# Patient Record
Sex: Male | Born: 1940 | Race: White | Hispanic: No | Marital: Married | Smoking: Former smoker
Health system: Southern US, Community
[De-identification: ages and names within clinical notes are randomized; demographics above are authoritative.]

## PROBLEM LIST (undated history)

## (undated) DIAGNOSIS — I10 Essential (primary) hypertension: Secondary | ICD-10-CM

## (undated) DIAGNOSIS — K760 Fatty (change of) liver, not elsewhere classified: Secondary | ICD-10-CM

## (undated) DIAGNOSIS — H9190 Unspecified hearing loss, unspecified ear: Secondary | ICD-10-CM

## (undated) DIAGNOSIS — G709 Myoneural disorder, unspecified: Secondary | ICD-10-CM

## (undated) DIAGNOSIS — Z860100 Personal history of colon polyps, unspecified: Secondary | ICD-10-CM

## (undated) DIAGNOSIS — R7302 Impaired glucose tolerance (oral): Secondary | ICD-10-CM

## (undated) DIAGNOSIS — F32A Depression, unspecified: Secondary | ICD-10-CM

## (undated) DIAGNOSIS — E785 Hyperlipidemia, unspecified: Secondary | ICD-10-CM

## (undated) DIAGNOSIS — F329 Major depressive disorder, single episode, unspecified: Secondary | ICD-10-CM

## (undated) HISTORY — DX: Personal history of colon polyps, unspecified: Z86.0100

## (undated) HISTORY — PX: COLONOSCOPY: SHX174

## (undated) HISTORY — PX: SQUAMOUS CELL CARCINOMA EXCISION: SHX2433

## (undated) HISTORY — DX: Essential (primary) hypertension: I10

## (undated) HISTORY — DX: Major depressive disorder, single episode, unspecified: F32.9

## (undated) HISTORY — DX: Fatty (change of) liver, not elsewhere classified: K76.0

## (undated) HISTORY — DX: Impaired glucose tolerance (oral): R73.02

## (undated) HISTORY — DX: Unspecified hearing loss, unspecified ear: H91.90

## (undated) HISTORY — DX: Depression, unspecified: F32.A

## (undated) HISTORY — DX: Hyperlipidemia, unspecified: E78.5

## (undated) HISTORY — DX: Personal history of colonic polyps: Z86.010

## (undated) HISTORY — PX: VASECTOMY: SHX75

---

## 1999-11-21 HISTORY — PX: TIBIA FRACTURE SURGERY: SHX806

## 2000-11-20 HISTORY — PX: WRIST FRACTURE SURGERY: SHX121

## 2005-02-10 ENCOUNTER — Encounter: Admission: RE | Admit: 2005-02-10 | Discharge: 2005-02-10 | Payer: Self-pay | Admitting: Family Medicine

## 2005-05-24 ENCOUNTER — Ambulatory Visit: Payer: Self-pay | Admitting: Gastroenterology

## 2005-06-15 ENCOUNTER — Ambulatory Visit: Payer: Self-pay | Admitting: Internal Medicine

## 2005-06-15 DIAGNOSIS — Z860100 Personal history of colon polyps, unspecified: Secondary | ICD-10-CM | POA: Insufficient documentation

## 2008-07-23 ENCOUNTER — Ambulatory Visit: Payer: Self-pay | Admitting: Internal Medicine

## 2008-08-06 ENCOUNTER — Encounter: Payer: Self-pay | Admitting: Internal Medicine

## 2008-08-06 ENCOUNTER — Ambulatory Visit: Payer: Self-pay | Admitting: Internal Medicine

## 2008-08-06 LAB — HM COLONOSCOPY

## 2008-08-07 ENCOUNTER — Encounter: Payer: Self-pay | Admitting: Internal Medicine

## 2008-11-05 ENCOUNTER — Encounter: Payer: Self-pay | Admitting: Internal Medicine

## 2009-04-21 ENCOUNTER — Telehealth: Payer: Self-pay | Admitting: Internal Medicine

## 2009-04-21 ENCOUNTER — Ambulatory Visit: Payer: Self-pay | Admitting: Internal Medicine

## 2009-04-21 DIAGNOSIS — N521 Erectile dysfunction due to diseases classified elsewhere: Secondary | ICD-10-CM

## 2009-04-21 DIAGNOSIS — E1169 Type 2 diabetes mellitus with other specified complication: Secondary | ICD-10-CM | POA: Insufficient documentation

## 2009-04-21 LAB — CONVERTED CEMR LAB
Cholesterol, target level: 200 mg/dL
HDL goal, serum: 40 mg/dL
LDL Goal: 130 mg/dL

## 2009-04-22 DIAGNOSIS — F3289 Other specified depressive episodes: Secondary | ICD-10-CM | POA: Insufficient documentation

## 2009-04-22 DIAGNOSIS — F329 Major depressive disorder, single episode, unspecified: Secondary | ICD-10-CM | POA: Insufficient documentation

## 2009-04-22 DIAGNOSIS — I10 Essential (primary) hypertension: Secondary | ICD-10-CM | POA: Insufficient documentation

## 2009-04-22 DIAGNOSIS — E785 Hyperlipidemia, unspecified: Secondary | ICD-10-CM | POA: Insufficient documentation

## 2009-05-26 ENCOUNTER — Emergency Department (HOSPITAL_COMMUNITY): Admission: EM | Admit: 2009-05-26 | Discharge: 2009-05-27 | Payer: Self-pay | Admitting: Emergency Medicine

## 2009-06-08 ENCOUNTER — Emergency Department (HOSPITAL_COMMUNITY): Admission: EM | Admit: 2009-06-08 | Discharge: 2009-06-08 | Payer: Self-pay | Admitting: Family Medicine

## 2009-06-24 ENCOUNTER — Emergency Department (HOSPITAL_COMMUNITY): Admission: EM | Admit: 2009-06-24 | Discharge: 2009-06-24 | Payer: Self-pay | Admitting: Family Medicine

## 2009-07-06 ENCOUNTER — Ambulatory Visit: Payer: Self-pay | Admitting: Internal Medicine

## 2009-07-06 DIAGNOSIS — E1140 Type 2 diabetes mellitus with diabetic neuropathy, unspecified: Secondary | ICD-10-CM | POA: Insufficient documentation

## 2009-07-06 LAB — CONVERTED CEMR LAB
ALT: 49 units/L (ref 0–53)
AST: 32 units/L (ref 0–37)
Albumin: 4.2 g/dL (ref 3.5–5.2)
Alkaline Phosphatase: 59 units/L (ref 39–117)
BUN: 21 mg/dL (ref 6–23)
Basophils Absolute: 0.1 10*3/uL (ref 0.0–0.1)
Basophils Relative: 0.8 % (ref 0.0–3.0)
Bilirubin Urine: NEGATIVE
Bilirubin, Direct: 0.2 mg/dL (ref 0.0–0.3)
CO2: 29 meq/L (ref 19–32)
CRP, High Sensitivity: 2 (ref 0.00–5.00)
Calcium: 9.4 mg/dL (ref 8.4–10.5)
Chloride: 108 meq/L (ref 96–112)
Creatinine, Ser: 1 mg/dL (ref 0.4–1.5)
Eosinophils Absolute: 0.2 10*3/uL (ref 0.0–0.7)
Eosinophils Relative: 3.8 % (ref 0.0–5.0)
Folate: >20 ng/mL
GFR calc non Af Amer: 79.08 mL/min (ref >60)
Glucose, Bld: 137 mg/dL — ABNORMAL HIGH (ref 70–99)
HCT: 43.6 % (ref 39.0–52.0)
Hemoglobin, Urine: NEGATIVE
Hemoglobin: 15 g/dL (ref 13.0–17.0)
Hgb A1c MFr Bld: 6.8 % — ABNORMAL HIGH (ref 4.6–6.5)
Iron: 113 ug/dL (ref 42–165)
Ketones, ur: NEGATIVE mg/dL
Leukocytes, UA: NEGATIVE
Lymphocytes Relative: 19.9 % (ref 12.0–46.0)
Lymphs Abs: 1.3 10*3/uL (ref 0.7–4.0)
MCHC: 34.4 g/dL (ref 30.0–36.0)
MCV: 91.2 fL (ref 78.0–100.0)
Monocytes Absolute: 0.7 10*3/uL (ref 0.1–1.0)
Monocytes Relative: 11.3 % (ref 3.0–12.0)
Neutro Abs: 4.2 10*3/uL (ref 1.4–7.7)
Neutrophils Relative %: 64.2 % (ref 43.0–77.0)
Nitrite: NEGATIVE
Platelets: 178 10*3/uL (ref 150.0–400.0)
Potassium: 4.1 meq/L (ref 3.5–5.1)
RBC: 4.78 M/uL (ref 4.22–5.81)
RDW: 12.1 % (ref 11.5–14.6)
Saturation Ratios: 32.4 % (ref 20.0–50.0)
Sodium: 143 meq/L (ref 135–145)
Specific Gravity, Urine: 1.02 (ref 1.000–1.030)
TSH: 1.71 microintl units/mL (ref 0.35–5.50)
Total Bilirubin: 1.3 mg/dL — ABNORMAL HIGH (ref 0.3–1.2)
Total Protein, Urine: NEGATIVE mg/dL
Total Protein: 7.1 g/dL (ref 6.0–8.3)
Transferrin: 248.8 mg/dL (ref 212.0–360.0)
Urine Glucose: NEGATIVE mg/dL
Urobilinogen, UA: 0.2 (ref 0.0–1.0)
Vitamin B-12: 343 pg/mL (ref 211–911)
WBC: 6.5 10*3/uL (ref 4.5–10.5)
pH: 6 (ref 5.0–8.0)

## 2009-07-07 ENCOUNTER — Encounter: Payer: Self-pay | Admitting: Internal Medicine

## 2009-08-05 ENCOUNTER — Ambulatory Visit: Payer: Self-pay | Admitting: Internal Medicine

## 2009-08-05 DIAGNOSIS — E118 Type 2 diabetes mellitus with unspecified complications: Secondary | ICD-10-CM | POA: Insufficient documentation

## 2009-08-17 ENCOUNTER — Ambulatory Visit: Payer: Self-pay | Admitting: Internal Medicine

## 2009-08-19 ENCOUNTER — Telehealth: Payer: Self-pay | Admitting: Internal Medicine

## 2009-11-05 ENCOUNTER — Ambulatory Visit: Payer: Self-pay | Admitting: Internal Medicine

## 2009-11-05 LAB — CONVERTED CEMR LAB
BUN: 16 mg/dL (ref 6–23)
CO2: 29 meq/L (ref 19–32)
Calcium: 9.8 mg/dL (ref 8.4–10.5)
Chloride: 100 meq/L (ref 96–112)
Cholesterol, target level: 200 mg/dL
Creatinine, Ser: 1.1 mg/dL (ref 0.4–1.5)
Folate: >20 ng/mL
GFR calc non Af Amer: 70.77 mL/min (ref >60)
Glucose, Bld: 98 mg/dL (ref 70–99)
HDL goal, serum: 40 mg/dL
Hgb A1c MFr Bld: 6.1 % (ref 4.6–6.5)
LDL Goal: 100 mg/dL
Potassium: 3.9 meq/L (ref 3.5–5.1)
Sodium: 138 meq/L (ref 135–145)
Vitamin B-12: 581 pg/mL (ref 211–911)

## 2010-01-03 ENCOUNTER — Telehealth: Payer: Self-pay | Admitting: Internal Medicine

## 2010-01-04 ENCOUNTER — Telehealth: Payer: Self-pay | Admitting: Internal Medicine

## 2010-04-29 ENCOUNTER — Ambulatory Visit: Payer: Self-pay | Admitting: Internal Medicine

## 2010-04-29 DIAGNOSIS — Z860100 Personal history of colon polyps, unspecified: Secondary | ICD-10-CM | POA: Insufficient documentation

## 2010-04-29 LAB — CONVERTED CEMR LAB
ALT: 25 units/L (ref 0–53)
AST: 21 units/L (ref 0–37)
Albumin: 4.4 g/dL (ref 3.5–5.2)
Alkaline Phosphatase: 55 units/L (ref 39–117)
BUN: 19 mg/dL (ref 6–23)
Basophils Absolute: 0 10*3/uL (ref 0.0–0.1)
Basophils Relative: 0.4 % (ref 0.0–3.0)
Bilirubin Urine: NEGATIVE
Bilirubin, Direct: 0.1 mg/dL (ref 0.0–0.3)
CO2: 32 meq/L (ref 19–32)
Calcium: 9.6 mg/dL (ref 8.4–10.5)
Chloride: 104 meq/L (ref 96–112)
Cholesterol: 129 mg/dL (ref 0–200)
Creatinine, Ser: 1 mg/dL (ref 0.4–1.5)
Eosinophils Absolute: 0.3 10*3/uL (ref 0.0–0.7)
Eosinophils Relative: 4.6 % (ref 0.0–5.0)
GFR calc non Af Amer: 82.69 mL/min (ref >60)
Glucose, Bld: 86 mg/dL (ref 70–99)
HCT: 41.4 % (ref 39.0–52.0)
HDL: 38.5 mg/dL — ABNORMAL LOW (ref >39.00)
Hemoglobin, Urine: NEGATIVE
Hemoglobin: 14.3 g/dL (ref 13.0–17.0)
Hgb A1c MFr Bld: 5.9 % (ref 4.6–6.5)
Ketones, ur: NEGATIVE mg/dL
LDL Cholesterol: 66 mg/dL (ref 0–99)
Lymphocytes Relative: 20.1 % (ref 12.0–46.0)
Lymphs Abs: 1.4 10*3/uL (ref 0.7–4.0)
MCHC: 34.5 g/dL (ref 30.0–36.0)
MCV: 92.2 fL (ref 78.0–100.0)
Monocytes Absolute: 0.6 10*3/uL (ref 0.1–1.0)
Monocytes Relative: 9.2 % (ref 3.0–12.0)
Neutro Abs: 4.5 10*3/uL (ref 1.4–7.7)
Neutrophils Relative %: 65.7 % (ref 43.0–77.0)
Nitrite: NEGATIVE
PSA: 0.11 ng/mL (ref 0.10–4.00)
Platelets: 211 10*3/uL (ref 150.0–400.0)
Potassium: 4.3 meq/L (ref 3.5–5.1)
RBC: 4.49 M/uL (ref 4.22–5.81)
RDW: 13.9 % (ref 11.5–14.6)
Sodium: 144 meq/L (ref 135–145)
Specific Gravity, Urine: 1.02 (ref 1.000–1.030)
TSH: 1.06 microintl units/mL (ref 0.35–5.50)
Total Bilirubin: 0.7 mg/dL (ref 0.3–1.2)
Total CHOL/HDL Ratio: 3
Total Protein, Urine: NEGATIVE mg/dL
Total Protein: 6.7 g/dL (ref 6.0–8.3)
Triglycerides: 121 mg/dL (ref 0.0–149.0)
Urine Glucose: NEGATIVE mg/dL
Urobilinogen, UA: 0.2 (ref 0.0–1.0)
VLDL: 24.2 mg/dL (ref 0.0–40.0)
WBC: 6.8 10*3/uL (ref 4.5–10.5)
pH: 6 (ref 5.0–8.0)

## 2010-05-01 ENCOUNTER — Encounter: Payer: Self-pay | Admitting: Internal Medicine

## 2010-05-02 ENCOUNTER — Telehealth: Payer: Self-pay | Admitting: Internal Medicine

## 2010-08-17 ENCOUNTER — Inpatient Hospital Stay (HOSPITAL_COMMUNITY): Admission: EM | Admit: 2010-08-17 | Discharge: 2010-08-17 | Payer: Self-pay | Admitting: Internal Medicine

## 2010-09-02 ENCOUNTER — Ambulatory Visit: Payer: Self-pay | Admitting: Internal Medicine

## 2010-09-02 ENCOUNTER — Encounter: Payer: Self-pay | Admitting: Internal Medicine

## 2010-09-02 DIAGNOSIS — S2239XA Fracture of one rib, unspecified side, initial encounter for closed fracture: Secondary | ICD-10-CM | POA: Insufficient documentation

## 2010-09-02 LAB — CONVERTED CEMR LAB
Cholesterol, target level: 200 mg/dL
HDL goal, serum: 40 mg/dL
LDL Goal: 70 mg/dL

## 2010-09-12 ENCOUNTER — Telehealth (INDEPENDENT_AMBULATORY_CARE_PROVIDER_SITE_OTHER): Payer: Self-pay | Admitting: *Deleted

## 2010-09-13 ENCOUNTER — Encounter (HOSPITAL_COMMUNITY)
Admission: RE | Admit: 2010-09-13 | Discharge: 2010-11-19 | Payer: Self-pay | Source: Home / Self Care | Attending: Internal Medicine | Admitting: Internal Medicine

## 2010-09-13 ENCOUNTER — Ambulatory Visit: Payer: Self-pay

## 2010-09-13 ENCOUNTER — Ambulatory Visit: Payer: Self-pay | Admitting: Cardiovascular Disease

## 2010-09-13 ENCOUNTER — Encounter: Payer: Self-pay | Admitting: Cardiovascular Disease

## 2010-09-14 ENCOUNTER — Encounter: Payer: Self-pay | Admitting: Internal Medicine

## 2010-09-14 ENCOUNTER — Telehealth: Payer: Self-pay | Admitting: Internal Medicine

## 2010-09-16 ENCOUNTER — Telehealth: Payer: Self-pay | Admitting: Internal Medicine

## 2010-11-02 ENCOUNTER — Ambulatory Visit: Payer: Self-pay | Admitting: Internal Medicine

## 2010-11-03 LAB — CONVERTED CEMR LAB
ALT: 19 units/L (ref 0–53)
AST: 19 units/L (ref 0–37)
Albumin: 4 g/dL (ref 3.5–5.2)
Alkaline Phosphatase: 58 units/L (ref 39–117)
BUN: 21 mg/dL (ref 6–23)
Basophils Absolute: 0 10*3/uL (ref 0.0–0.1)
Basophils Relative: 0.5 % (ref 0.0–3.0)
Bilirubin, Direct: 0.2 mg/dL (ref 0.0–0.3)
CO2: 30 meq/L (ref 19–32)
Calcium: 9.6 mg/dL (ref 8.4–10.5)
Chloride: 106 meq/L (ref 96–112)
Creatinine, Ser: 1 mg/dL (ref 0.4–1.5)
Eosinophils Absolute: 0.2 10*3/uL (ref 0.0–0.7)
Eosinophils Relative: 3.2 % (ref 0.0–5.0)
GFR calc non Af Amer: 83.57 mL/min (ref >60.00)
Glucose, Bld: 93 mg/dL (ref 70–99)
HCT: 41.3 % (ref 39.0–52.0)
Hemoglobin: 13.9 g/dL (ref 13.0–17.0)
Hgb A1c MFr Bld: 5.6 % (ref 4.6–6.5)
Lymphocytes Relative: 16.6 % (ref 12.0–46.0)
Lymphs Abs: 1 10*3/uL (ref 0.7–4.0)
MCHC: 33.7 g/dL (ref 30.0–36.0)
MCV: 92.9 fL (ref 78.0–100.0)
Monocytes Absolute: 0.6 10*3/uL (ref 0.1–1.0)
Monocytes Relative: 9.3 % (ref 3.0–12.0)
Neutro Abs: 4.3 10*3/uL (ref 1.4–7.7)
Neutrophils Relative %: 70.4 % (ref 43.0–77.0)
Platelets: 193 10*3/uL (ref 150.0–400.0)
Potassium: 4.3 meq/L (ref 3.5–5.1)
RBC: 4.44 M/uL (ref 4.22–5.81)
RDW: 13.7 % (ref 11.5–14.6)
Sodium: 143 meq/L (ref 135–145)
TSH: 0.87 microintl units/mL (ref 0.35–5.50)
Total Bilirubin: 0.8 mg/dL (ref 0.3–1.2)
Total Protein: 6.4 g/dL (ref 6.0–8.3)
WBC: 6.1 10*3/uL (ref 4.5–10.5)

## 2010-11-03 LAB — HM DIABETES FOOT EXAM

## 2010-12-08 ENCOUNTER — Ambulatory Visit
Admission: RE | Admit: 2010-12-08 | Discharge: 2010-12-08 | Payer: Self-pay | Source: Home / Self Care | Attending: Internal Medicine | Admitting: Internal Medicine

## 2010-12-08 DIAGNOSIS — M25519 Pain in unspecified shoulder: Secondary | ICD-10-CM | POA: Insufficient documentation

## 2010-12-08 DIAGNOSIS — S20219A Contusion of unspecified front wall of thorax, initial encounter: Secondary | ICD-10-CM | POA: Insufficient documentation

## 2010-12-22 ENCOUNTER — Other Ambulatory Visit: Payer: Self-pay | Admitting: Internal Medicine

## 2010-12-22 ENCOUNTER — Ambulatory Visit: Admit: 2010-12-22 | Payer: Self-pay | Admitting: Internal Medicine

## 2010-12-22 ENCOUNTER — Encounter: Payer: Self-pay | Admitting: Internal Medicine

## 2010-12-22 ENCOUNTER — Ambulatory Visit (INDEPENDENT_AMBULATORY_CARE_PROVIDER_SITE_OTHER): Payer: Medicare Other | Admitting: Internal Medicine

## 2010-12-22 ENCOUNTER — Encounter (INDEPENDENT_AMBULATORY_CARE_PROVIDER_SITE_OTHER): Payer: Self-pay | Admitting: *Deleted

## 2010-12-22 ENCOUNTER — Other Ambulatory Visit: Payer: Medicare Other

## 2010-12-22 DIAGNOSIS — S2239XA Fracture of one rib, unspecified side, initial encounter for closed fracture: Secondary | ICD-10-CM

## 2010-12-22 DIAGNOSIS — E785 Hyperlipidemia, unspecified: Secondary | ICD-10-CM

## 2010-12-22 DIAGNOSIS — I10 Essential (primary) hypertension: Secondary | ICD-10-CM

## 2010-12-22 DIAGNOSIS — S20219A Contusion of unspecified front wall of thorax, initial encounter: Secondary | ICD-10-CM

## 2010-12-22 DIAGNOSIS — E119 Type 2 diabetes mellitus without complications: Secondary | ICD-10-CM

## 2010-12-22 LAB — CBC WITH DIFFERENTIAL/PLATELET
Basophils Absolute: 0 10*3/uL (ref 0.0–0.1)
Basophils Relative: 0.6 % (ref 0.0–3.0)
Eosinophils Absolute: 0.2 10*3/uL (ref 0.0–0.7)
Eosinophils Relative: 3 % (ref 0.0–5.0)
HCT: 40 % (ref 39.0–52.0)
Hemoglobin: 14 g/dL (ref 13.0–17.0)
Lymphocytes Relative: 15.9 % (ref 12.0–46.0)
Lymphs Abs: 1.1 10*3/uL (ref 0.7–4.0)
MCHC: 35.1 g/dL (ref 30.0–36.0)
MCV: 91.2 fl (ref 78.0–100.0)
Monocytes Absolute: 0.6 10*3/uL (ref 0.1–1.0)
Monocytes Relative: 9.3 % (ref 3.0–12.0)
Neutro Abs: 4.7 10*3/uL (ref 1.4–7.7)
Neutrophils Relative %: 71.2 % (ref 43.0–77.0)
Platelets: 196 10*3/uL (ref 150.0–400.0)
RBC: 4.39 Mil/uL (ref 4.22–5.81)
RDW: 13.6 % (ref 11.5–14.6)
WBC: 6.6 10*3/uL (ref 4.5–10.5)

## 2010-12-22 LAB — BASIC METABOLIC PANEL
BUN: 23 mg/dL (ref 6–23)
CO2: 28 mEq/L (ref 19–32)
Calcium: 9 mg/dL (ref 8.4–10.5)
Chloride: 103 mEq/L (ref 96–112)
Creatinine, Ser: 1 mg/dL (ref 0.4–1.5)
GFR: 78.74 mL/min (ref >60.00)
Glucose, Bld: 85 mg/dL (ref 70–99)
Potassium: 4.2 mEq/L (ref 3.5–5.1)
Sodium: 137 mEq/L (ref 135–145)

## 2010-12-22 LAB — LIPID PANEL
Cholesterol: 101 mg/dL (ref 0–200)
HDL: 33.9 mg/dL — ABNORMAL LOW (ref >39.00)
LDL Cholesterol: 56 mg/dL (ref 0–99)
Total CHOL/HDL Ratio: 3
Triglycerides: 58 mg/dL (ref 0.0–149.0)
VLDL: 11.6 mg/dL (ref 0.0–40.0)

## 2010-12-22 LAB — HEMOGLOBIN A1C: Hgb A1c MFr Bld: 6.1 % (ref 4.6–6.5)

## 2010-12-22 LAB — HEPATIC FUNCTION PANEL
ALT: 19 U/L (ref 0–53)
AST: 20 U/L (ref 0–37)
Albumin: 3.9 g/dL (ref 3.5–5.2)
Alkaline Phosphatase: 90 U/L (ref 39–117)
Bilirubin, Direct: 0.1 mg/dL (ref 0.0–0.3)
Total Bilirubin: 0.3 mg/dL (ref 0.3–1.2)
Total Protein: 6.3 g/dL (ref 6.0–8.3)

## 2010-12-22 LAB — TSH: TSH: 1.09 u[IU]/mL (ref 0.35–5.50)

## 2010-12-22 NOTE — Letter (Signed)
Summary: Results Follow-up Letter  [facility]  [address] [place], [place] [address]   Phone: [phone]  Fax: [phone]    05/01/2010  [address] [place], [place]  [address]  Dear Mr. Tippin,   The following are the results of your recent test(s):  Test     Result     Pap Smear    Normal_______  Not Normal_____       Comments: _________________________________________________________ Cholesterol LDL(Bad cholesterol):          Your goal is less than:         HDL (Good cholesterol):        Your goal is more than: _________________________________________________________ Other Tests:   _________________________________________________________  Please call for an appointment Or _________________________________________________________ _________________________________________________________ _________________________________________________________  Sincerely,  Sanda Linger MD [facility]

## 2010-12-22 NOTE — Letter (Signed)
Summary: Lipid Letter  [facility]  [address] [place], [place] [address]   Phone: [phone]  Fax: [phone]    05/01/2010  Thomas Chung [address] [place], [place]  [address]  Dear Thomas Chung:  We have carefully reviewed your last lipid profile from 04/29/2010 and the results are noted below with a summary of recommendations for lipid management.    Cholesterol:       129     Goal: <200   HDL "good" Cholesterol:   24.40     Goal: >40   LDL "bad" Cholesterol:   66     Goal: <100   Triglycerides:       121.0     Goal: <150        TLC Diet (Therapeutic Lifestyle Change): Saturated Fats & Transfatty acids should be kept < 7% of total calories ***Reduce Saturated Fats Polyunstaurated Fat can be up to 10% of total calories Monounsaturated Fat Fat can be up to 20% of total calories Total Fat should be no greater than 25-35% of total calories Carbohydrates should be 50-60% of total calories Protein should be approximately 15% of total calories Fiber should be at least 20-30 grams a day ***Increased fiber may help lower LDL Total Cholesterol should be < 200mg /day Consider adding plant stanol/sterols to diet (example: Benacol spread) ***A higher intake of unsaturated fat may reduce Triglycerides and Increase HDL    Adjunctive Measures (may lower LIPIDS and reduce risk of Heart Attack) include: Aerobic Exercise (20-30 minutes 3-4 times a week) Limit Alcohol Consumption Weight Reduction Aspirin 75-81 mg a day by mouth (if not allergic or contraindicated) Dietary Fiber 20-30 grams a day by mouth     Current Medications: 1)    Lipitor 40 Mg Tabs (Atorvastatin calcium) .... Take 1 tablet by mouth once a day 2)    Metoprolol Succinate 50 Mg Xr24h-tab (Metoprolol succinate) .Marland Kitchen.. 1 once daily 3)    Asa 81mg   4)    Artic Cod Liver Oil  5)    Losartan Potassium-hctz 100-12.5 Mg Tabs (Losartan potassium-hctz) .... Once daily for hypertension 6)     Cialis 5 Mg Tabs (Tadalafil) .... Once daily as directed  If you have any questions, please call. We appreciate being able to work with you.   Sincerely,    [facility] Etta Grandchild MD

## 2010-12-22 NOTE — Progress Notes (Signed)
Summary: F/U OV  Phone Note Call from Patient Call back at Home Phone [phone]   Summary of Call: Pt has an apt 12/16, is this soon enough or does pt need apt sooner to f/u on stress test results?  Initial call taken by: Lamar Sprinkles, CMA,  September 16, 2010 3:06 PM  Follow-up for Phone Call        that is fine Follow-up by: Etta Grandchild MD,  September 18, 2010 3:11 PM  Additional Follow-up for Phone Call Additional follow up Details #1::        left vm for pt on hm # Additional Follow-up by: Lamar Sprinkles, CMA,  September 19, 2010 12:17 PM

## 2010-12-22 NOTE — Progress Notes (Signed)
Summary: mail order  Phone Note Refill Request Message from:  Fax from Pharmacy on January 04, 2010 11:35 AM  Refills Requested: Medication #1:  LOSARTAN POTASSIUM-HCTZ 100-12.5 MG TABS once daily for hypertension    Prescriptions: LOSARTAN POTASSIUM-HCTZ 100-12.5 MG TABS (LOSARTAN POTASSIUM-HCTZ) once daily for hypertension  #30 x 11   Entered by:   Rock Nephew CMA   Authorized by:   Etta Grandchild MD   Signed by:   Rock Nephew CMA on 01/04/2010   Method used:   Faxed to ...       [facility]* (mail-order)             ,          Ph: [phone]       Fax: [phone]   RxID:   [id number]

## 2010-12-22 NOTE — Letter (Signed)
Summary: Results Follow-up Letter  [facility]  [address] [place], [place] [address]   Phone: [phone]  Fax: [phone]    11/05/2009  [address] [place], [place]  [address]  Dear Thomas Chung,   The following are the results of your recent test(s):  Test       Result     B12 level       normal Blood sugar (A1C) 6.1   much better Kidney       normal   _________________________________________________________  Please call for an appointment Or _________________________________________________________ _________________________________________________________ _________________________________________________________  Sincerely,  Sanda Linger MD [facility]
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
## 2010-12-22 NOTE — Assessment & Plan Note (Signed)
Summary: Cardiology Nuclear Testing  Nuclear Med Background Indications for Stress Test: Evaluation for Ischemia  Indications Comments: Abnormal chest ZO:XWRUEA coronary atherosclerosis  History: CT/MRI, GXT  History Comments: 9/11 CT-Coronary Atherosclerosis   Symptoms Comments: No cardiac complaints.   Nuclear Pre-Procedure Cardiac Risk Factors: Family History - CAD, History of Smoking, Hypertension, Lipids, NIDDM Caffeine/Decaff Intake: none NPO After: 8:00 AM Lungs: Clear IV 0.9% NS with Angio Cath: 20g     IV Site: R Wrist IV Started by: Cathlyn Parsons, RN Chest Size (in) 48     Height (in): 75 Weight (lb): 246 BMI: 30.86 Tech Comments: Metoprolol held x 36 hours.  Nuclear Med Study 1 or 2 day study:  1 day     Stress Test Type:  Stress Reading MD:  Charlton Haws, MD     Referring MD:  Sanda Linger, MD Resting Radionuclide:  Technetium 67m Tetrofosmin     Resting Radionuclide Dose:  11 mCi  Stress Radionuclide:  Technetium 92m Tetrofosmin     Stress Radionuclide Dose:  33 mCi   Stress Protocol Exercise Time (min):  10:31 min     Max HR:  148 bpm     Predicted Max HR:  152 bpm  Max Systolic BP: 180 mm Hg     Percent Max HR:  97.37 %     METS: 12.5 Rate Pressure Product:  54098    Stress Test Technologist:  [employer]a [employer], CMA-N     Nuclear Technologist:  Doyne Keel, CNMT  Rest Procedure  Myocardial perfusion imaging was performed at rest 45 minutes following the intravenous administration of Technetium 20m Tetrofosmin.  Stress Procedure  The patient exercised for 10:31.  The patient stopped due to fatigue and denied any chest pain.  There were no diagnostic ST-T wave changes.  Technetium 27m Tetrofosmin was injected at peak exercise and myocardial perfusion imaging was performed after a brief delay.  QPS Raw Data Images:  Normal; no motion artifact; normal heart/lung ratio. Stress Images:  Normal homogeneous uptake in all areas of the myocardium. Rest Images:   Normal homogeneous uptake in all areas of the myocardium. Subtraction (SDS):  SDS 1 Transient Ischemic Dilatation:  .99  (Normal <1.22)  Lung/Heart Ratio:  .34  (Normal <0.45)  Quantitative Gated Spect Images QGS EDV:  93 ml QGS ESV:  41 ml QGS EF:  56 % QGS cine images:  normal  Findings Low risk nuclear study  Evidence for inferior infarct     Overall Impression  Exercise Capacity: Good exercise capacity. BP Response: Normal blood pressure response. Clinical Symptoms: No chest pain ECG Impression: No significant ST segment change suggestive of ischemia. 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

## 2010-12-22 NOTE — Assessment & Plan Note (Signed)
Summary: fall skiing yesterday,?fx'd ribs?/sore shoulder, can rotate a...   Vital Signs:  Patient profile:   70 year old male Height:      76 inches Weight:      253.25 pounds BMI:     30.94 O2 Sat:      92 % on Room air Temp:     97.9 degrees F oral Pulse rate:   63 / minute Pulse rhythm:   regular Resp:     16 per minute BP sitting:   130 / 80  (left arm) Cuff size:   large  Vitals Entered By: Zella Ball Ewing CMA Duncan Dull) (December 08, 2010 11:17 AM)  Nutrition Counseling: Patient's BMI is greater than 25 and therefore counseled on weight management options.  O2 Flow:  Room air CC: Fell, ribs and shoulder pain/RE Is Patient Diabetic? No Pain Assessment Patient in pain? no        Primary Care Provider:  Etta Grandchild MD  CC:  Larey Seat and ribs and shoulder pain/RE.  History of Present Illness: He returns for f/up and tells me that he was skiing yesterday and fell, landing on an outstretched RUE and his right chest wall and now has pain in those areas. His ROM on the shoulder is good.  Preventive Screening-Counseling & Management  Alcohol-Tobacco     Alcohol drinks/day: 0     Alcohol type: spirits     >5/day in last 3 mos: no     Alcohol Counseling: not indicated; patient does not drink     Feels need to cut down: yes     Feels annoyed by complaints: no     Feels guilty re: drinking: no     Needs 'eye opener' in am: no     Smoking Status: never     Tobacco Counseling: not indicated; no tobacco use  Hep-HIV-STD-Contraception     Hepatitis Risk: no risk noted     HIV Risk: no risk noted     STD Risk: no risk noted      Sexual History:  currently monogamous.        Drug Use:  no.        Blood Transfusions:  no.    Clinical Review Panels:  Prevention   Last Colonoscopy:  Location:  [facility].  (08/06/2008)   Last PSA:  0.11 (04/29/2010)  Immunizations   Last Tetanus Booster:  Tdap (04/29/2010)   Last Flu Vaccine:  given (08/17/2010)   Last  Pneumovax:  given (08/17/2010)  Lipid Management   Cholesterol:  129 (04/29/2010)   LDL (bad choesterol):  66 (04/29/2010)   HDL (good cholesterol):  38.50 (04/29/2010)  Diabetes Management   HgBA1C:  5.6 (11/03/2010)   Creatinine:  1.0 (11/03/2010)   Last Foot Exam:  yes (11/03/2010)   Last Flu Vaccine:  given (08/17/2010)   Last Pneumovax:  given (08/17/2010)  CBC   WBC:  6.1 (11/03/2010)   RBC:  4.44 (11/03/2010)   Hgb:  13.9 (11/03/2010)   Hct:  41.3 (11/03/2010)   Platelets:  193.0 (11/03/2010)   MCV  92.9 (11/03/2010)   MCHC  33.7 (11/03/2010)   RDW  13.7 (11/03/2010)   PMN:  70.4 (11/03/2010)   Lymphs:  16.6 (11/03/2010)   Monos:  9.3 (11/03/2010)   Eosinophils:  3.2 (11/03/2010)   Basophil:  0.5 (11/03/2010)  Complete Metabolic Panel   Glucose:  93 (11/03/2010)   Sodium:  143 (11/03/2010)   Potassium:  4.3 (11/03/2010)   Chloride:  106 (11/03/2010)   CO2:  30 (11/03/2010)   BUN:  21 (11/03/2010)   Creatinine:  1.0 (11/03/2010)   Albumin:  4.0 (11/03/2010)   Total Protein:  6.4 (11/03/2010)   Calcium:  9.6 (11/03/2010)   Total Bili:  0.8 (11/03/2010)   Alk Phos:  58 (11/03/2010)   SGPT (ALT):  19 (11/03/2010)   SGOT (AST):  19 (11/03/2010)   Medications Prior to Update: 1)  Lipitor 40 Mg Tabs (Atorvastatin Calcium) .... Take 1 Tablet By Mouth Once A Day 2)  Metoprolol Succinate 50 Mg Xr24h-Tab (Metoprolol Succinate) .Marland Kitchen.. 1 Once Daily 3)  Asa 81mg  4)  Artic Cod Liver Oil 5)  Losartan Potassium-Hctz 100-12.5 Mg Tabs (Losartan Potassium-Hctz) .... Once Daily For Hypertension 6)  Cialis 5 Mg Tabs (Tadalafil) .... Once Daily As Directed 7)  Vitamin C 500mg  .... Take 1 Tablet By Mouth Two Times A Day 8)  Vitamin D3 5000iu/ Red Wine Extract 200mg  .... Take 1 Tablet By Mouth Once A Day 9)  Bayer Contour Monitor W/device Kit (Blood Glucose Monitoring Suppl) .... Use Two Times A Day As Directed 10)  Bayer Contour Test  Strp (Glucose Blood) .... Use Two Times A Day  As Directed  Current Medications (verified): 1)  Lipitor 40 Mg Tabs (Atorvastatin Calcium) .... Take 1 Tablet By Mouth Once A Day 2)  Metoprolol Succinate 50 Mg Xr24h-Tab (Metoprolol Succinate) .Marland Kitchen.. 1 Once Daily 3)  Asa 81mg  4)  Artic Cod Liver Oil 5)  Losartan Potassium-Hctz 100-12.5 Mg Tabs (Losartan Potassium-Hctz) .... Once Daily For Hypertension 6)  Cialis 5 Mg Tabs (Tadalafil) .... Once Daily As Directed 7)  Vitamin C 500mg  .... Take 1 Tablet By Mouth Two Times A Day 8)  Vitamin D3 5000iu/ Red Wine Extract 200mg  .... Take 1 Tablet By Mouth Once A Day 9)  Bayer Contour Monitor W/device Kit (Blood Glucose Monitoring Suppl) .... Use Two Times A Day As Directed 10)  Bayer Contour Test  Strp (Glucose Blood) .... Use Two Times A Day As Directed  Allergies (verified): No Known Drug Allergies  Past History:  Past Medical History: Last updated: 04/29/2010 Depression Hyperlipidemia Hypertension Fatty Liver Colonic polyps, hx of  Past Surgical History: Last updated: 04/21/2009 Vasectomy  Family History: Last updated: 04/21/2009 Family History of Alcoholism/Addiction Family History of CAD Male 1st degree relative <60 Family History Diabetes 1st degree relative  Social History: Last updated: 04/21/2009 Married Alcohol use-no Drug use-no Regular exercise-no Retired  Risk Factors: Alcohol Use: 0 (12/08/2010) >5 drinks/d w/in last 3 months: no (12/08/2010) Exercise: no (04/21/2009)  Risk Factors: Smoking Status: never (12/08/2010)  Family History: Reviewed history from 04/21/2009 and no changes required. Family History of Alcoholism/Addiction Family History of CAD Male 1st degree relative <60 Family History Diabetes 1st degree relative  Social History: Reviewed history from 04/21/2009 and no changes required. Married Alcohol use-no Drug use-no Regular exercise-no Retired  Review of Systems       The patient complains of chest pain.  The patient denies  anorexia, fever, weight loss, weight gain, syncope, dyspnea on exertion, peripheral edema, prolonged cough, headaches, hemoptysis, abdominal pain, hematuria, suspicious skin lesions, and abnormal bleeding.   Resp:  Complains of chest discomfort; denies chest pain with inspiration, cough, coughing up blood, morning headaches, pleuritic, shortness of breath, sputum productive, and wheezing. GI:  Denies abdominal pain, loss of appetite, nausea, and vomiting. GU:  Denies hematuria. MS:  Complains of joint pain and stiffness; denies joint redness, joint swelling, loss of strength, low  back pain, and muscle aches.  Physical Exam  General:  alert, well-developed, well-nourished, well-hydrated, appropriate dress, normal appearance, healthy-appearing, and good hygiene.   Head:  normocephalic, atraumatic, no abnormalities observed, and no abnormalities palpated.   Eyes:  vision grossly intact, pupils equal, and pupils round.   Ears:  R ear normal and L ear normal.   Mouth:  Oral mucosa and oropharynx without lesions or exudates.  Teeth in good repair. Neck:  supple, full ROM, no masses, no thyromegaly, no JVD, normal carotid upstroke, no carotid bruits, no cervical lymphadenopathy, and no neck tenderness.   Chest Wall:  No deformities, masses, tenderness or gynecomastia noted. Lungs:  normal respiratory effort, no intercostal retractions, no accessory muscle use, normal breath sounds, no dullness, no fremitus, no crackles, and no wheezes.   Heart:  Normal rate and regular rhythm. S1 and S2 normal without gallop, murmur, click, rub or other extra sounds. Abdomen:  Bowel sounds positive,abdomen soft and non-tender without masses, organomegaly or hernias noted. Msk:  normal ROM, no joint tenderness, no joint swelling, no joint warmth, no redness over joints, no joint deformities, no joint instability, no crepitation, and no muscle atrophy.   Pulses:  R and L carotid,radial,femoral,dorsalis pedis and posterior  tibial pulses are full and equal bilaterally Extremities:  No clubbing, cyanosis, edema, or deformity noted with normal full range of motion of all joints.   Neurologic:  No cranial nerve deficits noted. Station and gait are normal. Plantar reflexes are down-going bilaterally. DTRs are symmetrical throughout. Sensory, motor and coordinative functions appear intact. Skin:  turgor normal, color normal, no rashes, no suspicious lesions, no ecchymoses, no petechiae, no purpura, no ulcerations, and no edema.   Cervical Nodes:  No lymphadenopathy noted Axillary Nodes:  No palpable lymphadenopathy Psych:  Cognition and judgment appear intact. Alert and cooperative with normal attention span and concentration. No apparent delusions, illusions, hallucinations   Impression & Recommendations:  Problem # 1:  SHOULDER PAIN, RIGHT (ICD-719.41) Assessment New will check for fracture Orders: T-Shoulder Right (73030TC) T-Ribs Unilateral 2 Views (71100TC)  Problem # 2:  CONTUSION OF CHEST WALL (ICD-922.1) Assessment: New will check for rib fractures Orders: T-Shoulder Right (73030TC) T-Ribs Unilateral 2 Views (71100TC)  Problem # 3:  HYPERTENSION (ICD-401.9) Assessment: Improved  His updated medication list for this problem includes:    Metoprolol Succinate 50 Mg Xr24h-tab (Metoprolol succinate) .Marland Kitchen... 1 once daily    Losartan Potassium-hctz 100-12.5 Mg Tabs (Losartan potassium-hctz) ..... Once daily for hypertension  BP today: 130/80 Prior BP: 130/70 (11/03/2010)  Prior 10 Yr Risk Heart Disease: N/A (09/02/2010)  Labs Reviewed: K+: 4.3 (11/03/2010) Creat: : 1.0 (11/03/2010)   Chol: 129 (04/29/2010)   HDL: 38.50 (04/29/2010)   LDL: 66 (04/29/2010)   TG: 121.0 (04/29/2010)  Complete Medication List: 1)  Lipitor 40 Mg Tabs (Atorvastatin calcium) .... Take 1 tablet by mouth once a day 2)  Metoprolol Succinate 50 Mg Xr24h-tab (Metoprolol succinate) .Marland Kitchen.. 1 once daily 3)  Asa 81mg   4)  Artic Cod  Liver Oil  5)  Losartan Potassium-hctz 100-12.5 Mg Tabs (Losartan potassium-hctz) .... Once daily for hypertension 6)  Cialis 5 Mg Tabs (Tadalafil) .... Once daily as directed 7)  Vitamin C 500mg   .... Take 1 tablet by mouth two times a day 8)  Vitamin D3 5000iu/ Red Wine Extract 200mg   .... Take 1 tablet by mouth once a day 9)  Bayer Contour Monitor W/device Kit (Blood glucose monitoring suppl) .... Use two times a day as  directed 10)  Bayer Contour Test Strp (Glucose blood) .... Use two times a day as directed  Patient Instructions: 1)  Please schedule a follow-up appointment in 2 weeks. 2)  Take 650-1000mg  of Tylenol every 4-6 hours as needed for relief of pain or comfort of fever AVOID taking more than 4000mg   in a 24 hour period (can cause liver damage in higher doses). 3)  Take 400-600mg  of Ibuprofen (Advil, Motrin) with food every 4-6 hours as needed for relief of pain or comfort of fever.   Orders Added: 1)  T-Shoulder Right [73030TC] 2)  T-Ribs Unilateral 2 Views [71100TC] 3)  Est. Patient Level IV [16109]

## 2010-12-22 NOTE — Progress Notes (Signed)
Summary: Avalide alternative  Phone Note From Pharmacy   Caller: [facility]  [phone] Call For: Invoice #:  [id number]  Summary of Call: All strengths of Avalide are on back order. [facility] would like to know if the patient can be given Losartan/HCTZ. Please advise. Initial call taken by: Lucious Groves,  January 03, 2010 1:33 PM    New/Updated Medications: LOSARTAN POTASSIUM-HCTZ 100-12.5 MG TABS (LOSARTAN POTASSIUM-HCTZ) once daily for hypertension Prescriptions: LOSARTAN POTASSIUM-HCTZ 100-12.5 MG TABS (LOSARTAN POTASSIUM-HCTZ) once daily for hypertension  #30 x 11   Entered and Authorized by:   Etta Grandchild MD   Signed by:   Etta Grandchild MD on 01/04/2010   Method used:   Electronically to        [facility]  [phone]* (retail)       [address] [place], [place]  [address]       Ph: [phone] or [phone]       Fax: [phone]   RxID:   [id number]

## 2010-12-22 NOTE — Assessment & Plan Note (Signed)
Summary: 3 MO ROV /NWS  -- rs'd due to weather/cd   Vital Signs:  Patient profile:   70 year old male Height:      75 inches Weight:      267 pounds BMI:     33.49 O2 Sat:      96 % on Room air Temp:     97.2 degrees F oral Pulse rate:   62 / minute Pulse rhythm:   regular Resp:     16 per minute BP sitting:   130 / 72  (left arm) Cuff size:   large  Vitals Entered By: Rock Nephew CMA (November 05, 2009 1:52 PM)  Nutrition Counseling: Patient's BMI is greater than 25 and therefore counseled on weight management options.  O2 Flow:  Room air CC: follow-up visit, Hypertension Management, Lipid Management Is Patient Diabetic? No Pain Assessment Patient in pain? no        Primary Care [facility]:  Etta Grandchild MD  CC:  follow-up visit, Hypertension Management, and Lipid Management.  History of Present Illness: He returns for f/up and has lost weight with diet and exercise. He wants to recheck his B12 level and says the Metanx is too expensive.  Hypertension History:      He denies headache, chest pain, palpitations, dyspnea with exertion, orthopnea, PND, peripheral edema, neurologic problems, and side effects from treatment.  He notes no problems with any antihypertensive medication side effects.        Positive major cardiovascular risk factors include male age 7 years old or older, diabetes, hyperlipidemia, hypertension, and family history for ischemic heart disease (females less than 68 years old).  Negative major cardiovascular risk factors include non-tobacco-user status.        Further assessment for target organ damage reveals no history of ASHD, cardiac end-organ damage (CHF/LVH), stroke/TIA, peripheral vascular disease, renal insufficiency, or hypertensive retinopathy.    Lipid Management History:      Positive NCEP/ATP III risk factors include male age 80 years old or older, diabetes, family history for ischemic heart disease (females less than 26 years old), and  hypertension.  Negative NCEP/ATP III risk factors include non-tobacco-user status, no ASHD (atherosclerotic heart disease), no prior stroke/TIA, no peripheral vascular disease, and no history of aortic aneurysm.        The patient states that he knows about the "Therapeutic Lifestyle Change" diet.  His compliance with the TLC diet is fair.  The patient expresses understanding of adjunctive measures for cholesterol lowering.  Adjunctive measures started by the patient include aerobic exercise, fiber, ASA, and weight reduction.  He expresses no side effects from his lipid-lowering medication.  The patient denies any symptoms to suggest myopathy or liver disease.      Preventive Screening-Counseling & Management  Alcohol-Tobacco     Alcohol drinks/day: 2     Alcohol type: spirits     >5/day in last 3 mos: no     Alcohol Counseling: to STOP drinking     Feels need to cut down: yes     Feels annoyed by complaints: no     Feels guilty re: drinking: no     Needs 'eye opener' in am: no     Smoking Status: never  Hep-HIV-STD-Contraception     Hepatitis Risk: no risk noted     HIV Risk: no risk noted     STD Risk: no risk noted  Current Medications (verified): 1)  Lipitor 40 Mg Tabs (Atorvastatin Calcium) .... Take 1  Tablet By Mouth Once A Day 2)  Metoprolol Succinate 50 Mg Xr24h-Tab (Metoprolol Succinate) .Marland Kitchen.. 1 Once Daily 3)  Asa 81mg  4)  Abc Plus Senior Multivitamin 5)  Artic Cod Liver Oil 6)  Avalide 300-12.5 Mg Tabs (Irbesartan-Hydrochlorothiazide) .... One By Mouth Qam 7)  Metanx 3-35-2 Mg Tabs (L-Methylfolate-B6-B12) .... One By Mouth Two Times A Day For Neuropathy 8)  Cialis 5 Mg Tabs (Tadalafil) .... Once Daily As Directed  Allergies (verified): No Known Drug Allergies  Past History:  Past Medical History: Reviewed history from 04/21/2009 and no changes required. Depression Hyperlipidemia Hypertension Fatty Liver  Past Surgical History: Reviewed history from 04/21/2009  and no changes required. Vasectomy  Family History: Reviewed history from 04/21/2009 and no changes required. Family History of Alcoholism/Addiction Family History of CAD Male 1st degree relative <60 Family History Diabetes 1st degree relative  Social History: Reviewed history from 04/21/2009 and no changes required. Married Alcohol use-no Drug use-no Regular exercise-no Retired  Review of Systems  The patient denies anorexia, fever, weight loss, weight gain, chest pain, syncope, dyspnea on exertion, prolonged cough, headaches, hemoptysis, abdominal pain, hematuria, difficulty walking, and depression.    Physical Exam  General:  alert, well-developed, well-nourished, well-hydrated, cooperative to examination, good hygiene, and overweight-appearing.   Mouth:  Oral mucosa and oropharynx without lesions or exudates.  Teeth in good repair. Neck:  supple, full ROM, no masses, no thyroid nodules or tenderness, normal carotid upstroke, no carotid bruits, and no cervical lymphadenopathy.   Lungs:  Normal respiratory effort, chest expands symmetrically. Lungs are clear to auscultation, no crackles or wheezes. Heart:  Normal rate and regular rhythm. S1 and S2 normal without gallop, murmur, click, rub or other extra sounds. Abdomen:  Bowel sounds positive,abdomen soft and non-tender without masses, organomegaly or hernias noted. Msk:  No deformity or scoliosis noted of thoracic or lumbar spine.   Extremities:  trace left pedal edema and trace right pedal edema.   Neurologic:  alert & oriented X3, cranial nerves II-XII intact, strength normal in all extremities, gait normal, RUE hyporeflexia, RLE hyporeflexia, RLE sensory loss, LUE hyporeflexia, LLE hyporeflexia, and LLE sensory loss.   Skin:  Intact without suspicious lesions or rashes Cervical Nodes:  No lymphadenopathy noted Psych:  Cognition and judgment appear intact. Alert and cooperative with normal attention span and concentration. No  apparent delusions, illusions, hallucinations  Diabetes Management Exam:    Foot Exam (with socks and/or shoes not present):       Sensory-Pinprick/Light touch:          Left medial foot (L-4): normal          Left dorsal foot (L-5): normal          Left lateral foot (S-1): normal          Right medial foot (L-4): normal          Right dorsal foot (L-5): normal          Right lateral foot (S-1): normal       Sensory-Monofilament:          Left foot: normal          Right foot: normal       Inspection:          Left foot: normal          Right foot: normal       Nails:          Left foot: normal  Right foot: normal   Impression & Recommendations:  Problem # 1:  DIABETES-TYPE 2 (ICD-250.00) Assessment Improved  His updated medication list for this problem includes:    Avalide 300-12.5 Mg Tabs (Irbesartan-hydrochlorothiazide) ..... One by mouth qam  Orders: Venipuncture (56213) TLB-B12 + Folate Pnl (08657_84696-E95/MWU) TLB-BMP (Basic Metabolic Panel-BMET) (80048-METABOL) TLB-A1C / Hgb A1C (Glycohemoglobin) (83036-A1C)  Labs Reviewed: Creat: 1.0 (07/06/2009)    Reviewed HgBA1c results: 6.8 (07/06/2009)  Problem # 2:  PERIPHERAL NEUROPATHY (ICD-356.9) Assessment: Improved  Orders: Venipuncture (13244) TLB-B12 + Folate Pnl (01027_25366-Y40/HKV) TLB-BMP (Basic Metabolic Panel-BMET) (80048-METABOL) TLB-A1C / Hgb A1C (Glycohemoglobin) (83036-A1C)  Problem # 3:  HYPERTENSION (ICD-401.9) Assessment: Improved  His updated medication list for this problem includes:    Metoprolol Succinate 50 Mg Xr24h-tab (Metoprolol succinate) .Marland Kitchen... 1 once daily    Avalide 300-12.5 Mg Tabs (Irbesartan-hydrochlorothiazide) ..... One by mouth qam  Orders: Venipuncture (42595) TLB-B12 + Folate Pnl ([id number]-R51/OAC) TLB-BMP (Basic Metabolic Panel-BMET) (80048-METABOL) TLB-A1C / Hgb A1C (Glycohemoglobin) (83036-A1C)  BP today: 130/72 Prior BP: 138/82 (08/05/2009)  Prior 10  Yr Risk Heart Disease: Not enough information (04/21/2009)  Labs Reviewed: K+: 4.1 (07/06/2009) Creat: : 1.0 (07/06/2009)     Problem # 4:  HYPERLIPIDEMIA (ICD-272.4) Assessment: Unchanged  His updated medication list for this problem includes:    Lipitor 40 Mg Tabs (Atorvastatin calcium) .Marland Kitchen... Take 1 tablet by mouth once a day  Labs Reviewed: SGOT: 32 (07/06/2009)   SGPT: 49 (07/06/2009)  Lipid Goals: Chol Goal: 200 (11/05/2009)   HDL Goal: 40 (11/05/2009)   LDL Goal: 100 (11/05/2009)   TG Goal: 150 (11/05/2009)  Prior 10 Yr Risk Heart Disease: Not enough information (04/21/2009)  Problem # 5:  DEPRESSION (ICD-311) Assessment: Improved  Discussed treatment options, including trial of antidpressant medication.  Patient agrees to call if any worsening of symptoms or thoughts of doing harm arise. Verified that the patient has no suicidal ideation at this time.   Complete Medication List: 1)  Lipitor 40 Mg Tabs (Atorvastatin calcium) .... Take 1 tablet by mouth once a day 2)  Metoprolol Succinate 50 Mg Xr24h-tab (Metoprolol succinate) .Marland Kitchen.. 1 once daily 3)  Asa 81mg   4)  Abc Plus Senior Multivitamin  5)  Artic Cod Liver Oil  6)  Avalide 300-12.5 Mg Tabs (Irbesartan-hydrochlorothiazide) .... One by mouth qam 7)  Metanx 3-35-2 Mg Tabs (L-methylfolate-b6-b12) .... One by mouth two times a day for neuropathy 8)  Cialis 5 Mg Tabs (Tadalafil) .... Once daily as directed  Hypertension Assessment/Plan:      The patient's hypertensive risk group is category C: Target organ damage and/or diabetes.  Today's blood pressure is 130/72.  His blood pressure goal is < 140/90.  Lipid Assessment/Plan:      Based on NCEP/ATP III, the patient's risk factor category is "history of diabetes".  The patient's lipid goals are as follows: Total cholesterol goal is 200; LDL cholesterol goal is 100; HDL cholesterol goal is 40; Triglyceride goal is 150.    Patient Instructions: 1)  Please schedule a  follow-up appointment in 6 months. 2)  It is important that you exercise regularly at least 20 minutes 5 times a week. If you develop chest pain, have severe difficulty breathing, or feel very tired , stop exercising immediately and seek medical attention. 3)  You need to lose weight. Consider a lower calorie diet and regular exercise.  4)  Check your blood sugars regularly. If your readings are usually above : or below 70 you should contact our office.  5)  It is important that your Diabetic A1c level is checked every 3 months. 6)  See your eye doctor yearly to check for diabetic eye damage. 7)  Check your feet each night for sore areas, calluses or signs of infection. 8)  Check your Blood Pressure regularly. If it is above 130/80: you should make an appointment.

## 2010-12-22 NOTE — Assessment & Plan Note (Signed)
Summary: 2 mos f/u #/cd   Vital Signs:  Patient profile:   70 year old male Height:      75 inches Weight:      244.75 pounds BMI:     30.70 O2 Sat:      98 % on Room air Temp:     97.1 degrees F oral Pulse rate:   60 / minute Pulse rhythm:   regular Resp:     16 per minute BP sitting:   130 / 70  (left arm) Cuff size:   large  Vitals Entered By: Rock Nephew CMA (November 03, 2010 9:06 AM)  Nutrition Counseling: Patient's BMI is greater than 25 and therefore counseled on weight management options.  O2 Flow:  Room air CC: follow-up visit, Lipid Management Is Patient Diabetic? Yes Did you bring your meter with you today? No Pain Assessment Patient in pain? no       Does patient need assistance? Functional Status Self care Ambulation Normal   Primary Care Provider:  Etta Grandchild MD  CC:  follow-up visit and Lipid Management.  History of Present Illness:  Follow-Up Visit      This is a 70 year old man who presents for Follow-up visit.  The patient denies chest pain, palpitations, dizziness, syncope, low blood sugar symptoms, high blood sugar symptoms, edema, SOB, DOE, PND, and orthopnea.  Since the last visit the patient notes no new problems or concerns.  The patient reports taking meds as prescribed, not monitoring BP, not monitoring blood sugars, and dietary noncompliance.  When questioned about possible medication side effects, the patient notes none.    Lipid Management History:      Positive NCEP/ATP III risk factors include male age 69 years old or older, diabetes, HDL cholesterol less than 40, family history for ischemic heart disease (females less than 70 years old), hypertension, and ASHD (either angina/prior MI/prior CABG).  Negative NCEP/ATP III risk factors include non-tobacco-user status, no prior stroke/TIA, no peripheral vascular disease, and no history of aortic aneurysm.        The patient states that he knows about the "Therapeutic Lifestyle Change"  diet.  His compliance with the TLC diet is fair.  The patient expresses understanding of adjunctive measures for cholesterol lowering.  Adjunctive measures started by the patient include aerobic exercise, fiber, limit alcohol consumpton, and weight reduction.  He expresses no side effects from his lipid-lowering medication.  The patient denies any symptoms to suggest myopathy or liver disease.    Preventive Screening-Counseling & Management  Alcohol-Tobacco     Alcohol drinks/day: 0     Alcohol type: spirits     >5/day in last 3 mos: no     Alcohol Counseling: to STOP drinking     Feels need to cut down: yes     Feels annoyed by complaints: no     Feels guilty re: drinking: no     Needs 'eye opener' in am: no     Smoking Status: never     Tobacco Counseling: not indicated; no tobacco use  Current Medications (verified): 1)  Lipitor 40 Mg Tabs (Atorvastatin Calcium) .... Take 1 Tablet By Mouth Once A Day 2)  Metoprolol Succinate 50 Mg Xr24h-Tab (Metoprolol Succinate) .Marland Kitchen.. 1 Once Daily 3)  Asa 81mg  4)  Artic Cod Liver Oil 5)  Losartan Potassium-Hctz 100-12.5 Mg Tabs (Losartan Potassium-Hctz) .... Once Daily For Hypertension 6)  Cialis 5 Mg Tabs (Tadalafil) .... Once Daily As Directed 7)  Onetouch  Ultra Test  Strp (Glucose Blood) .... Test Once Daily As Directed 8)  Onetouch Ultrasoft Lancets  Misc (Lancets) .... Test Once Daily As Directed 9)  Vitamin C 500mg  .... Take 1 Tablet By Mouth Two Times A Day 10)  Vitamin D3 5000iu/ Red Wine Extract 200mg  .... Take 1 Tablet By Mouth Once A Day  Allergies (verified): No Known Drug Allergies  Past History:  Past Medical History: Last updated: 04/29/2010 Depression Hyperlipidemia Hypertension Fatty Liver Colonic polyps, hx of  Past Surgical History: Last updated: 04/21/2009 Vasectomy  Family History: Last updated: 04/21/2009 Family History of Alcoholism/Addiction Family History of CAD Male 1st degree relative <60 Family History  Diabetes 1st degree relative  Social History: Last updated: 04/21/2009 Married Alcohol use-no Drug use-no Regular exercise-no Retired  Risk Factors: Alcohol Use: 0 (11/03/2010) >5 drinks/d w/in last 3 months: no (11/03/2010) Exercise: no (04/21/2009)  Risk Factors: Smoking Status: never (11/03/2010)  Family History: Reviewed history from 04/21/2009 and no changes required. Family History of Alcoholism/Addiction Family History of CAD Male 1st degree relative <60 Family History Diabetes 1st degree relative  Social History: Reviewed history from 04/21/2009 and no changes required. Married Alcohol use-no Drug use-no Regular exercise-no Retired  Review of Systems  The patient denies anorexia, fever, weight loss, weight gain, chest pain, syncope, dyspnea on exertion, peripheral edema, prolonged cough, headaches, hemoptysis, abdominal pain, melena, hematochezia, severe indigestion/heartburn, hematuria, depression, unusual weight change, abnormal bleeding, and enlarged lymph nodes.    Physical Exam  General:  alert, well-developed, well-nourished, well-hydrated, appropriate dress, normal appearance, healthy-appearing, and good hygiene.   Head:  normocephalic, atraumatic, no abnormalities observed, and no abnormalities palpated.   Mouth:  Oral mucosa and oropharynx without lesions or exudates.  Teeth in good repair. Neck:  supple, full ROM, no masses, no thyromegaly, no JVD, normal carotid upstroke, no carotid bruits, no cervical lymphadenopathy, and no neck tenderness.   Chest Wall:  no deformities, no tenderness, and no masses.   Lungs:  normal respiratory effort, no intercostal retractions, no accessory muscle use, normal breath sounds, no dullness, no fremitus, no crackles, and no wheezes.   Heart:  normal rate, regular rhythm, no murmur, no gallop, and no rub.   Abdomen:  Bowel sounds positive,abdomen soft and non-tender without masses, organomegaly or hernias noted. Msk:   normal ROM, no joint tenderness, no joint swelling, no joint warmth, no redness over joints, no joint deformities, no joint instability, no crepitation, and no muscle atrophy.   Pulses:  R and L carotid,radial,femoral,dorsalis pedis and posterior tibial pulses are full and equal bilaterally Extremities:  No clubbing, cyanosis, edema, or deformity noted with normal full range of motion of all joints.   Neurologic:  alert & oriented X3, cranial nerves II-XII intact, strength normal in all extremities, gait normal, RUE hyporeflexia, RLE hyporeflexia, RLE sensory loss, LUE hyporeflexia, LLE hyporeflexia, and LLE sensory loss.   Skin:  turgor normal, color normal, no rashes, no suspicious lesions, no ecchymoses, no petechiae, no purpura, no ulcerations, and no edema.   Cervical Nodes:  no anterior cervical adenopathy and no posterior cervical adenopathy.   Psych:  Cognition and judgment appear intact. Alert and cooperative with normal attention span and concentration. No apparent delusions, illusions, hallucinations  Diabetes Management Exam:    Foot Exam (with socks and/or shoes not present):       Sensory-Pinprick/Light touch:          Left medial foot (L-4): diminished  Left dorsal foot (L-5): diminished          Left lateral foot (S-1): diminished          Right medial foot (L-4): diminished          Right dorsal foot (L-5): diminished          Right lateral foot (S-1): diminished       Sensory-Monofilament:          Left foot: diminished          Right foot: diminished       Inspection:          Left foot: normal          Right foot: normal       Nails:          Left foot: thickened          Right foot: thickened   Impression & Recommendations:  Problem # 1:  DIABETES-TYPE 2 (ICD-250.00) Assessment Unchanged  His updated medication list for this problem includes:    Losartan Potassium-hctz 100-12.5 Mg Tabs (Losartan potassium-hctz) ..... Once daily for  hypertension  Orders: Venipuncture (04540) TLB-BMP (Basic Metabolic Panel-BMET) (80048-METABOL) TLB-CBC Platelet - w/Differential (85025-CBCD) TLB-Hepatic/Liver Function Pnl (80076-HEPATIC) TLB-TSH (Thyroid Stimulating Hormone) (84443-TSH) TLB-A1C / Hgb A1C (Glycohemoglobin) (98119-J4N) Ophthalmology Referral (Ophthalmology)  Labs Reviewed: Creat: 1.0 (04/29/2010)    Reviewed HgBA1c results: 5.9 (04/29/2010)  6.1 (11/05/2009)  Problem # 2:  HYPERTENSION (ICD-401.9) Assessment: Improved  His updated medication list for this problem includes:    Metoprolol Succinate 50 Mg Xr24h-tab (Metoprolol succinate) .Marland Kitchen... 1 once daily    Losartan Potassium-hctz 100-12.5 Mg Tabs (Losartan potassium-hctz) ..... Once daily for hypertension  Orders: Venipuncture (82956) TLB-BMP (Basic Metabolic Panel-BMET) (80048-METABOL) TLB-CBC Platelet - w/Differential (85025-CBCD) TLB-Hepatic/Liver Function Pnl (80076-HEPATIC) TLB-TSH (Thyroid Stimulating Hormone) (84443-TSH) TLB-A1C / Hgb A1C (Glycohemoglobin) (83036-A1C)  BP today: 130/70 Prior BP: 138/82 (09/02/2010)  Prior 10 Yr Risk Heart Disease: N/A (09/02/2010)  Labs Reviewed: K+: 4.3 (04/29/2010) Creat: : 1.0 (04/29/2010)   Chol: 129 (04/29/2010)   HDL: 38.50 (04/29/2010)   LDL: 66 (04/29/2010)   TG: 121.0 (04/29/2010)  Problem # 3:  HYPERLIPIDEMIA (ICD-272.4) Assessment: Unchanged  His updated medication list for this problem includes:    Lipitor 40 Mg Tabs (Atorvastatin calcium) .Marland Kitchen... Take 1 tablet by mouth once a day  Orders: Venipuncture (21308) TLB-BMP (Basic Metabolic Panel-BMET) (80048-METABOL) TLB-CBC Platelet - w/Differential (85025-CBCD) TLB-Hepatic/Liver Function Pnl (80076-HEPATIC) TLB-TSH (Thyroid Stimulating Hormone) (84443-TSH) TLB-A1C / Hgb A1C (Glycohemoglobin) (83036-A1C)  Complete Medication List: 1)  Lipitor 40 Mg Tabs (Atorvastatin calcium) .... Take 1 tablet by mouth once a day 2)  Metoprolol Succinate 50 Mg  Xr24h-tab (Metoprolol succinate) .Marland Kitchen.. 1 once daily 3)  Asa 81mg   4)  Artic Cod Liver Oil  5)  Losartan Potassium-hctz 100-12.5 Mg Tabs (Losartan potassium-hctz) .... Once daily for hypertension 6)  Cialis 5 Mg Tabs (Tadalafil) .... Once daily as directed 7)  Vitamin C 500mg   .... Take 1 tablet by mouth two times a day 8)  Vitamin D3 5000iu/ Red Wine Extract 200mg   .... Take 1 tablet by mouth once a day 9)  Bayer Contour Monitor W/device Kit (Blood glucose monitoring suppl) .... Use two times a day as directed 10)  Bayer Contour Test Strp (Glucose blood) .... Use two times a day as directed  Lipid Assessment/Plan:      Based on NCEP/ATP III, the patient's risk factor category is "history of coronary disease, peripheral vascular disease, cerebrovascular  disease, or aortic aneurysm along with either diabetes, current smoker, or LDL > 130 plus HDL < 40 plus triglycerides > 200".  The patient's lipid goals are as follows: Total cholesterol goal is 200; LDL cholesterol goal is 70; HDL cholesterol goal is 40; Triglyceride goal is 150.     Patient Instructions: 1)  Please schedule a follow-up appointment in 4 months. 2)  It is important that you exercise regularly at least 20 minutes 5 times a week. If you develop chest pain, have severe difficulty breathing, or feel very tired , stop exercising immediately and seek medical attention. 3)  You need to lose weight. Consider a lower calorie diet and regular exercise.  4)  Check your blood sugars regularly. If your readings are usually above 200 or below 70 you should contact our office. 5)  It is important that your Diabetic A1c level is checked every 3 months. 6)  See your eye doctor yearly to check for diabetic eye damage. 7)  Check your feet each night for sore areas, calluses or signs of infection. 8)  Check your Blood Pressure regularly. If it is above 130/80: you should make an appointment. Prescriptions: BAYER CONTOUR TEST  STRP (GLUCOSE BLOOD)  Use two times a day as directed  #100 x 0   Entered and Authorized by:   Etta Grandchild MD   Signed by:   Etta Grandchild MD on 11/03/2010   Method used:   Samples Given   RxID:   [id number] BAYER CONTOUR MONITOR W/DEVICE KIT (BLOOD GLUCOSE MONITORING SUPPL) Use two times a day as directed  #1 x 0   Entered and Authorized by:   Etta Grandchild MD   Signed by:   Etta Grandchild MD on 11/03/2010   Method used:   Samples Given   RxID:   [id number]    Orders Added: 1)  Venipuncture [36415] 2)  TLB-BMP (Basic Metabolic Panel-BMET) [80048-METABOL] 3)  TLB-CBC Platelet - w/Differential [85025-CBCD] 4)  TLB-Hepatic/Liver Function Pnl [80076-HEPATIC] 5)  TLB-TSH (Thyroid Stimulating Hormone) [84443-TSH] 6)  TLB-A1C / Hgb A1C (Glycohemoglobin) [83036-A1C] 7)  Ophthalmology Referral [Ophthalmology] 8)  Est. Patient Level III [32440]

## 2010-12-22 NOTE — Letter (Signed)
Summary: Results Follow-up Letter  [facility]  [address] [place], [place] [address]   Phone: [phone]  Fax: [phone]    09/14/2010  [address] [place], [place]  [address]  Dear Mr. 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

## 2010-12-22 NOTE — Assessment & Plan Note (Signed)
Summary: 6 mos f/u #/cd   Vital Signs:  Patient profile:   70 year old male Height:      75 inches Weight:      257 pounds BMI:     32.24 O2 Sat:      98 % on Room air Temp:     97.1 degrees F oral Pulse rate:   60 / minute Pulse rhythm:   regular Resp:     16 per minute BP sitting:   148 / 80  (left arm) Cuff size:   large  Vitals Entered By: Rock Nephew CMA (April 29, 2010 1:21 PM)  Nutrition Counseling: Patient's BMI is greater than 25 and therefore counseled on weight management options.  O2 Flow:  Room air CC: follow-up visit// need mail order refill, Preventive Care Is Patient Diabetic? Yes Did you bring your meter with you today? No   Primary Care Provider:  Etta Grandchild MD  CC:  follow-up visit// need mail order refill and Preventive Care.  History of Present Illness:  Follow-Up Visit      This is a 70 year old man who presents for Follow-up visit.  The patient denies chest pain, palpitations, dizziness, syncope, low blood sugar symptoms, high blood sugar symptoms, edema, SOB, DOE, PND, and orthopnea.  Since the last visit the patient notes no new problems or concerns.  The patient reports taking meds as prescribed, monitoring BP, monitoring blood sugars, and dietary compliance.  When questioned about possible medication side effects, the patient notes none. The sensation  in his feet is better now that he does not drink alcohol anymore.  Preventive Screening-Counseling & Management  Alcohol-Tobacco     Alcohol drinks/day: 0     Alcohol type: spirits     >5/day in last 3 mos: no     Alcohol Counseling: to STOP drinking     Feels need to cut down: yes     Feels annoyed by complaints: no     Feels guilty re: drinking: no     Needs 'eye opener' in am: no     Smoking Status: never  Hep-HIV-STD-Contraception     Hepatitis Risk: no risk noted     HIV Risk: no risk noted     STD Risk: no risk noted  Allergies (verified): No Known Drug Allergies  Past  History:  Past Medical History: Depression Hyperlipidemia Hypertension Fatty Liver Colonic polyps, hx of  Past Surgical History: Reviewed history from 04/21/2009 and no changes required. Vasectomy  Family History: Reviewed history from 04/21/2009 and no changes required. Family History of Alcoholism/Addiction Family History of CAD Male 1st degree relative <60 Family History Diabetes 1st degree relative  Social History: Reviewed history from 04/21/2009 and no changes required. Married Alcohol use-no Drug use-no Regular exercise-no Retired  Review of Systems  The patient denies anorexia, fever, weight loss, weight gain, chest pain, syncope, dyspnea on exertion, peripheral edema, prolonged cough, headaches, hemoptysis, abdominal pain, melena, hematochezia, severe indigestion/heartburn, hematuria, suspicious skin lesions, difficulty walking, depression, abnormal bleeding, enlarged lymph nodes, angioedema, and testicular masses.   GI:  Denies abdominal pain, bloody stools, change in bowel habits, constipation, dark tarry stools, indigestion, loss of appetite, nausea, vomiting, vomiting blood, and yellowish skin color. GU:  Complains of erectile dysfunction; denies decreased libido, discharge, dysuria, genital sores, hematuria, incontinence, nocturia, urinary frequency, and urinary hesitancy.  Physical Exam  General:  alert, well-developed, well-nourished, well-hydrated, cooperative to examination, good hygiene, and overweight-appearing.   Head:  normocephalic, atraumatic,  and no abnormalities observed.   Eyes:  vision grossly intact, pupils equal, and pupils round.   Mouth:  Oral mucosa and oropharynx without lesions or exudates.  Teeth in good repair. Neck:  supple, full ROM, no masses, no thyroid nodules or tenderness, normal carotid upstroke, no carotid bruits, and no cervical lymphadenopathy.   Lungs:  Normal respiratory effort, chest expands symmetrically. Lungs are clear to  auscultation, no crackles or wheezes. Heart:  Normal rate and regular rhythm. S1 and S2 normal without gallop, murmur, click, rub or other extra sounds. Abdomen:  Bowel sounds positive,abdomen soft and non-tender without masses, organomegaly or hernias noted. Rectal:  No external abnormalities noted. Normal sphincter tone. No rectal masses or tenderness. Genitalia:  circumcised, no hydrocele, no varicocele, no scrotal masses, no testicular masses or atrophy, no cutaneous lesions, and no urethral discharge.   Prostate:  no gland enlargement, no nodules, no asymmetry, and no induration.   Msk:  No deformity or scoliosis noted of thoracic or lumbar spine.   Pulses:  R and L carotid,radial,femoral,dorsalis pedis and posterior tibial pulses are full and equal bilaterally Extremities:  trace left pedal edema and trace right pedal edema.   Neurologic:  alert & oriented X3, cranial nerves II-XII intact, strength normal in all extremities, gait normal, RUE hyporeflexia, RLE hyporeflexia, RLE sensory loss, LUE hyporeflexia, LLE hyporeflexia, and LLE sensory loss.   Skin:  Intact without suspicious lesions or rashes Cervical Nodes:  No lymphadenopathy noted Axillary Nodes:  No palpable lymphadenopathy Inguinal Nodes:  No significant adenopathy Psych:  Cognition and judgment appear intact. Alert and cooperative with normal attention span and concentration. No apparent delusions, illusions, hallucinations  Diabetes Management Exam:    Foot Exam (with socks and/or shoes not present):       Sensory-Pinprick/Light touch:          Left medial foot (L-4): normal          Left dorsal foot (L-5): normal          Left lateral foot (S-1): normal          Right medial foot (L-4): normal          Right dorsal foot (L-5): normal          Right lateral foot (S-1): normal       Sensory-Monofilament:          Left foot: normal          Right foot: normal       Inspection:          Left foot: normal          Right  foot: normal       Nails:          Left foot: normal          Right foot: normal   Impression & Recommendations:  Problem # 1:  DIABETES-TYPE 2 (ICD-250.00) Assessment Unchanged  His updated medication list for this problem includes:    Losartan Potassium-hctz 100-12.5 Mg Tabs (Losartan potassium-hctz) ..... Once daily for hypertension  Orders: Venipuncture (95621) TLB-BMP (Basic Metabolic Panel-BMET) (80048-METABOL) TLB-CBC Platelet - w/Differential (85025-CBCD) TLB-Hepatic/Liver Function Pnl (80076-HEPATIC) TLB-TSH (Thyroid Stimulating Hormone) (84443-TSH) TLB-Lipid Panel (80061-LIPID) TLB-A1C / Hgb A1C (Glycohemoglobin) (83036-A1C) TLB-PSA (Prostate Specific Antigen) (84153-PSA) TLB-Udip w/ Micro (81001-URINE) Ophthalmology Referral (Ophthalmology)  Labs Reviewed: Creat: 1.1 (11/05/2009)    Reviewed HgBA1c results: 6.1 (11/05/2009)  6.8 (07/06/2009)  Problem # 2:  HYPERTENSION (ICD-401.9) Assessment: Improved  His updated medication list  for this problem includes:    Metoprolol Succinate 50 Mg Xr24h-tab (Metoprolol succinate) .Marland Kitchen... 1 once daily    Losartan Potassium-hctz 100-12.5 Mg Tabs (Losartan potassium-hctz) ..... Once daily for hypertension  Orders: Venipuncture (16109) TLB-BMP (Basic Metabolic Panel-BMET) (80048-METABOL) TLB-CBC Platelet - w/Differential (85025-CBCD) TLB-Hepatic/Liver Function Pnl (80076-HEPATIC) TLB-TSH (Thyroid Stimulating Hormone) (84443-TSH) TLB-Lipid Panel (80061-LIPID) TLB-A1C / Hgb A1C (Glycohemoglobin) (83036-A1C) TLB-PSA (Prostate Specific Antigen) (84153-PSA) TLB-Udip w/ Micro (81001-URINE) Prescription Created Electronically [phone])  BP today: 148/80 Prior BP: 130/72 (11/05/2009)  Prior 10 Yr Risk Heart Disease: Not enough information (04/21/2009)  Labs Reviewed: K+: 3.9 (11/05/2009) Creat: : 1.1 (11/05/2009)     Problem # 3:  HYPERLIPIDEMIA (ICD-272.4) Assessment: Unchanged  His updated medication list for this  problem includes:    Lipitor 40 Mg Tabs (Atorvastatin calcium) .Marland Kitchen... Take 1 tablet by mouth once a day  Orders: Venipuncture (09811) TLB-BMP (Basic Metabolic Panel-BMET) (80048-METABOL) TLB-CBC Platelet - w/Differential (85025-CBCD) TLB-Hepatic/Liver Function Pnl (80076-HEPATIC) TLB-TSH (Thyroid Stimulating Hormone) (84443-TSH) TLB-Lipid Panel (80061-LIPID) TLB-A1C / Hgb A1C (Glycohemoglobin) (83036-A1C) TLB-PSA (Prostate Specific Antigen) (84153-PSA) TLB-Udip w/ Micro (81001-URINE)  Labs Reviewed: SGOT: 32 (07/06/2009)   SGPT: 49 (07/06/2009)  Lipid Goals: Chol Goal: 200 (11/05/2009)   HDL Goal: 40 (11/05/2009)   LDL Goal: 100 (11/05/2009)   TG Goal: 150 (11/05/2009)  Prior 10 Yr Risk Heart Disease: Not enough information (04/21/2009)  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 Once daily as directed  Orders: Venipuncture (62130) TLB-BMP (Basic Metabolic Panel-BMET) (80048-METABOL) TLB-CBC Platelet - w/Differential (85025-CBCD) TLB-Hepatic/Liver Function Pnl (80076-HEPATIC) TLB-TSH (Thyroid Stimulating Hormone) (84443-TSH) TLB-Lipid Panel (80061-LIPID) TLB-A1C / Hgb A1C (Glycohemoglobin) (83036-A1C) TLB-PSA (Prostate Specific Antigen) (84153-PSA) TLB-Udip w/ Micro (81001-URINE)  Discussed proper use of medications, as well as side effects.   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 Take 1 tablet by mouth once a day 2)  Metoprolol Succinate 50 Mg Xr24h-tab (Metoprolol succinate) .Marland Kitchen.. 1 once daily 3)  Asa 81mg   4)  Artic Cod Liver Oil  5)  Losartan Potassium-hctz 100-12.5 Mg Tabs (Losartan potassium-hctz) .... Once daily for hypertension 6)  Cialis 5 Mg Tabs (Tadalafil) .... Once daily as directed  Other Orders: Tdap => 22yrs IM (46962) Admin 1st Vaccine (95284)  Colorectal Screening:  Current Recommendations:    Hemoccult: NEG X 1 today  PSA Screening:    Reviewed PSA screening recommendations: PSA ordered  Immunization & Chemoprophylaxis:    Tetanus vaccine: Tdap  (04/29/2010)    Influenza vaccine: Fluvax 3+  (08/17/2009)  Patient Instructions: 1)  Please schedule a follow-up appointment in 4 months. 2)  It is important that you exercise regularly at least 20 minutes 5 times a week. If you develop chest pain, have severe difficulty breathing, or feel very tired , stop exercising immediately and seek medical attention. 3)  You need to lose weight. Consider a lower calorie diet and regular exercise.  4)  Check your blood sugars regularly. If your readings are usually above 200  or below 70 you should contact our office. 5)  It is important that your  Diabetic A1c level is checked every 3 months. 6)  See your eye doctor yearly to check for diabetic eye damage. 7)  Check your feet each night for sore areas, calluses or signs of infection. 8)  Check your Blood Pressure regularly. If it is above 130/80: you should make an appointment. Prescriptions: LOSARTAN POTASSIUM-HCTZ 100-12.5 MG TABS (LOSARTAN POTASSIUM-HCTZ) once daily for hypertension  #90 x 3   Entered and Authorized by:   Etta Grandchild MD   Signed by:   Etta Grandchild MD on 04/29/2010   Method used:   Electronically to        [facility]* (mail-order)             ,          Ph: [phone]       Fax: [phone]   RxID:   [id number]      Immunizations  Administered:  Tetanus Vaccine:    Vaccine Type: Tdap    Site: right deltoid    Mfr: GlaxoSmithKline    Dose: 0.5 ml    Route: IM    Given by: Rock Nephew CMA    Exp. Date: 02/12/2012    Lot #: [id number]    VIS given: 10/08/07 version given April 29, 2010.

## 2010-12-22 NOTE — Progress Notes (Signed)
Summary: Lipitor PA  Phone Note From Pharmacy   Caller: [facility] Summary of Call: Per Insurance, will not cover Lipitor w/o step therapy. Patient has tried and failed pravachol and crestor. Please call [phone] ([facility] Part [facility]) for PA. Initial call taken by: Rock Nephew CMA,  April 21, 2009 1:39 PM  Follow-up for Phone Call        [employer] and spoke with [place]. Lipitor 40 approved from 04/22/2009 thru 04/22/2010. OK for pharmacy to submit claim. Patient notified lmovm Follow-up by: Rock Nephew CMA,  April 22, 2009 8:46 AM

## 2010-12-22 NOTE — Assessment & Plan Note (Signed)
Summary: FLU SHOT/NWS  Nurse Visit Flu Vaccine Consent Questions     Do you have a history of severe allergic reactions to this vaccine? no    Any prior history of allergic reactions to egg and/or gelatin? no    Do you have a sensitivity to the preservative Thimersol? no    Do you have a past history of Guillan-Barre Syndrome? no    Do you currently have an acute febrile illness? no    Have you ever had a severe reaction to latex? no    Vaccine information given and explained to patient? yes    Are you currently pregnant? no    Lot Number:[id number]   Exp Date:05/19/2010   Site Given  Left Deltoid IM   Allergies: No Known Drug Allergies  Orders Added: 1)  Flu Vaccine 69yrs + [90658] 2)  Administration Flu vaccine - MCR [G0008]

## 2010-12-22 NOTE — Letter (Signed)
Summary: Results Follow-up Letter  [facility]  [address] [place], [place] [address]   Phone: [phone]  Fax: [phone]    07/07/2009  [address] [place], [place]  [address]  Dear [name],   The following are the results of your recent test(s):  Test     Result     Blood sugars   high, diabetes B12/iron     normal Liver/kidney   normal Thyroid     normal CBC       normal Urine       normal   _________________________________________________________  Please call for an appointment in 2-3 weeks _________________________________________________________ _________________________________________________________ _________________________________________________________  Sincerely,  Sanda Linger MD [facility]

## 2010-12-22 NOTE — Assessment & Plan Note (Signed)
Summary: 1 MO ROV /NWS   Vital Signs:  Patient profile:   70 year old male Height:      75 inches Weight:      281 pounds O2 Sat:      96 % on Room air Temp:     98.1 degrees F oral Pulse rate:   76 / minute Pulse rhythm:   regular BP sitting:   138 / 82  (left arm) Cuff size:   large  Vitals Entered By: Rock Nephew CMA (August 05, 2009 10:30 AM)  O2 Flow:  Room air CC: follow-up visit, Hypertension Management   Primary Care Provider:  Etta Grandchild MD  CC:  follow-up visit and Hypertension Management.  History of Present Illness: He returns to discuss treatment of diabetes. He's decided he does not want to take medication, instead he chooses lifestyle modifications.  Hypertension History:      He complains of neurologic problems, but denies headache, chest pain, palpitations, dyspnea with exertion, orthopnea, peripheral edema, visual symptoms, syncope, and side effects from treatment.  He notes no problems with any antihypertensive medication side effects.        Positive major cardiovascular risk factors include male age 30 years old or older, diabetes, hyperlipidemia, hypertension, and family history for ischemic heart disease (females less than 66 years old).  Negative major cardiovascular risk factors include non-tobacco-user status.        Further assessment for target organ damage reveals no history of ASHD, cardiac end-organ damage (CHF/LVH), stroke/TIA, peripheral vascular disease, renal insufficiency, or hypertensive retinopathy.     Clinical Review Panels:  Diabetes Management   HgBA1C:  6.8 (07/06/2009)   Creatinine:  1.0 (07/06/2009)  CBC   WBC:  6.5 (07/06/2009)   RBC:  4.78 (07/06/2009)   Hgb:  15.0 (07/06/2009)   Hct:  43.6 (07/06/2009)   Platelets:  178.0 (07/06/2009)   MCV  91.2 (07/06/2009)   MCHC  34.4 (07/06/2009)   RDW  12.1 (07/06/2009)   PMN:  64.2 (07/06/2009)   Lymphs:  19.9 (07/06/2009)   Monos:  11.3 (07/06/2009)   Eosinophils:   3.8 (07/06/2009)   Basophil:  0.8 (07/06/2009)  Complete Metabolic Panel   Glucose:  137 (07/06/2009)   Sodium:  143 (07/06/2009)   Potassium:  4.1 (07/06/2009)   Chloride:  108 (07/06/2009)   CO2:  29 (07/06/2009)   BUN:  21 (07/06/2009)   Creatinine:  1.0 (07/06/2009)   Albumin:  4.2 (07/06/2009)   Total Protein:  7.1 (07/06/2009)   Calcium:  9.4 (07/06/2009)   Total Bili:  1.3 (07/06/2009)   Alk Phos:  59 (07/06/2009)   SGPT (ALT):  49 (07/06/2009)   SGOT (AST):  32 (07/06/2009)   Current Medications (verified): 1)  Lipitor 40 Mg Tabs (Atorvastatin Calcium) .... Take 1 Tablet By Mouth Once A Day 2)  Metoprolol Succinate 50 Mg Xr24h-Tab (Metoprolol Succinate) 3)  Asa 81mg  4)  Abc Plus Senior Multivitamin 5)  Artic Cod Liver Oil 6)  Cialis 20 Mg Tabs (Tadalafil) .... Take As Directed 7)  Avalide 300-12.5 Mg Tabs (Irbesartan-Hydrochlorothiazide) .... One By Mouth Qam 8)  Metanx 3-35-2 Mg Tabs (L-Methylfolate-B6-B12) .... One By Mouth Two Times A Day For Neuropathy  Allergies (verified): No Known Drug Allergies  Past History:  Past Medical History: Reviewed history from 04/21/2009 and no changes required. Depression Hyperlipidemia Hypertension Fatty Liver  Past Surgical History: Reviewed history from 04/21/2009 and no changes required. Vasectomy  Family History: Reviewed history from 04/21/2009 and  no changes required. Family History of Alcoholism/Addiction Family History of CAD Male 1st degree relative <60 Family History Diabetes 1st degree relative  Social History: Reviewed history from 04/21/2009 and no changes required. Married Alcohol use-no Drug use-no Regular exercise-no Retired  Review of Systems Endo:  Denies excessive hunger, excessive thirst, excessive urination, heat intolerance, and polyuria.  Physical Exam  General:  alert, well-developed, well-nourished, well-hydrated, cooperative to examination, good hygiene, and overweight-appearing.    Mouth:  Oral mucosa and oropharynx without lesions or exudates.  Teeth in good repair. Neck:  supple, full ROM, no masses, no thyroid nodules or tenderness, normal carotid upstroke, no carotid bruits, and no cervical lymphadenopathy.   Lungs:  Normal respiratory effort, chest expands symmetrically. Lungs are clear to auscultation, no crackles or wheezes. Heart:  Normal rate and regular rhythm. S1 and S2 normal without gallop, murmur, click, rub or other extra sounds. Abdomen:  Bowel sounds positive,abdomen soft and non-tender without masses, organomegaly or hernias noted. Msk:  No deformity or scoliosis noted of thoracic or lumbar spine.   Pulses:  R and L carotid,radial,femoral,dorsalis pedis and posterior tibial pulses are full and equal bilaterally Extremities:  trace left pedal edema and trace right pedal edema.   Neurologic:  alert & oriented X3, cranial nerves II-XII intact, strength normal in all extremities, gait normal, RUE hyporeflexia, RLE hyporeflexia, RLE sensory loss, LUE hyporeflexia, LLE hyporeflexia, and LLE sensory loss.   Skin:  Intact without suspicious lesions or rashes Psych:  Cognition and judgment appear intact. Alert and cooperative with normal attention span and concentration. No apparent delusions, illusions, hallucinations   Impression & Recommendations:  Problem # 1:  PERIPHERAL NEUROPATHY (ICD-356.9) restart Metanx  Problem # 2:  HYPERTENSION (ICD-401.9)  His updated medication list for this problem includes:    Metoprolol Succinate 50 Mg Xr24h-tab (Metoprolol succinate)    Avalide 300-12.5 Mg Tabs (Irbesartan-hydrochlorothiazide) ..... One by mouth qam  Problem # 3:  DIABETES-TYPE 2 (ICD-250.00) Assessment: New  His updated medication list for this problem includes:    Avalide 300-12.5 Mg Tabs (Irbesartan-hydrochlorothiazide) ..... One by mouth qam  Problem # 4:  ORGANIC IMPOTENCE (ICD-607.84) Assessment: Unchanged change to once daily cialis at his  request The following medications were removed from the medication list:    Cialis 20 Mg Tabs (Tadalafil) .Marland Kitchen... Take as directed His updated medication list for this problem includes:    Cialis 5 Mg Tabs (Tadalafil) ..... Once daily as directed  Complete Medication List: 1)  Lipitor 40 Mg Tabs (Atorvastatin calcium) .... Take 1 tablet by mouth once a day 2)  Metoprolol Succinate 50 Mg Xr24h-tab (Metoprolol succinate) 3)  Asa 81mg   4)  Abc Plus Senior Multivitamin  5)  Artic Cod Liver Oil  6)  Avalide 300-12.5 Mg Tabs (Irbesartan-hydrochlorothiazide) .... One by mouth qam 7)  Metanx 3-35-2 Mg Tabs (L-methylfolate-b6-b12) .... One by mouth two times a day for neuropathy 8)  Cialis 5 Mg Tabs (Tadalafil) .... Once daily as directed  Hypertension Assessment/Plan:      The patient's hypertensive risk group is category C: Target organ damage and/or diabetes.  Today's blood pressure is 138/82.  His blood pressure goal is < 140/90.  Patient Instructions: 1)  Please schedule a follow-up appointment in 3 months. 2)  It is important that you exercise regularly at least 20 minutes 5 times a week. If you develop chest pain, have severe difficulty breathing, or feel very tired , stop exercising immediately and seek medical attention. 3)  You  need to lose weight. Consider a lower calorie diet and regular exercise.  4)  Check your blood sugars regularly. If your readings are usually above 200  or below 70 you should contact our office. 5)  It is important that your Diabetic A1c level is checked every 3 months. 6)  See your eye doctor yearly to check for diabetic eye damage. 7)  Check your feet each night for sore areas, calluses or signs of infection. 8)  Check your Blood Pressure regularly. If it is above 140/90: you should make an appointment. Prescriptions: CIALIS 5 MG TABS (TADALAFIL) once daily as directed  #30 x 11   Entered and Authorized by:   Etta Grandchild MD   Signed by:   Etta Grandchild  MD on 08/05/2009   Method used:   Electronically to        [facility]  [phone]* (retail)       [address] [place], [place]  [address]       Ph: [phone] or [phone]       Fax: [phone]   RxID:   [id number]

## 2010-12-22 NOTE — Progress Notes (Signed)
Summary: Nuc Pre-Procedure  Phone Note Outgoing Call Call back at [facility] Phone [phone]   Call placed by: Antionette Char RN,  September 12, 2010 1:24 PM Call placed to: Patient Reason for Call: Confirm/change Appt Summary of Call: Reviewed information on [employer] Information Sheet (see scanned document for further details).  Spoke with Patient.     Nuclear Med Background Indications for Stress Test: Evaluation for Ischemia   History: CT/MRI, GXT  History Comments: 9/11 CT-Coronary Atherosclerosis     Nuclear Pre-Procedure Cardiac Risk Factors: Family History - CAD, Hypertension, Lipids, NIDDM Height (in): 75

## 2010-12-22 NOTE — Progress Notes (Signed)
Summary: RFs       New/Updated Medications: METOPROLOL SUCCINATE 50 MG XR24H-TAB (METOPROLOL SUCCINATE) 1 once daily Prescriptions: METOPROLOL SUCCINATE 50 MG XR24H-TAB (METOPROLOL SUCCINATE) 1 once daily  #90 x 3   Entered by:   Lamar Sprinkles, CMA   Authorized by:   Etta Grandchild MD   Signed by:   Lamar Sprinkles, CMA on 08/19/2009   Method used:   Telephoned to ...       [facility]* (mail-order)             ,          Ph: [phone]       Fax: [phone]   RxID:   [id number]

## 2010-12-22 NOTE — Progress Notes (Signed)
Summary: RESULTS  Phone Note Call from Patient Call back at Home Phone [phone]   Summary of Call: Patient is requesting results of stress test, I see letter in EMR, need to call pt to inform.  Initial call taken by: Lamar Sprinkles, CMA,  September 16, 2010 9:28 AM  Follow-up for Phone Call        LMOVM advising result letter mailed out .Marland KitchenAlvy Beal Archie CMA  September 16, 2010 11:28 AM

## 2010-12-22 NOTE — Miscellaneous (Signed)
Summary: GI PV  Clinical Lists Changes  Medications: Added new medication of DULCOLAX 5 MG  TBEC (BISACODYL) Day before procedure take 2 at 3pm and 2 at 8pm. - Signed Added new medication of METOCLOPRAMIDE HCL 10 MG  TABS (METOCLOPRAMIDE HCL) As per prep instructions. - Signed Added new medication of MIRALAX   POWD (POLYETHYLENE GLYCOL 3350) As per prep  instructions. - Signed Rx of DULCOLAX 5 MG  TBEC (BISACODYL) Day before procedure take 2 at 3pm and 2 at 8pm.;  #4 x 0;  Signed;  Entered by: Barton Fanny RN;  Authorized by: Iva Boop MD;  Method used: [facility] to [facility]  [phone]*, [address], [place], [place]  [address], Ph: [phone] or [phone], Fax: [phone] Rx of METOCLOPRAMIDE HCL 10 MG  TABS (METOCLOPRAMIDE HCL) As per prep instructions.;  #2 x 0;  Signed;  Entered by: Barton Fanny RN;  Authorized by: Iva Boop MD;  Method used: Electronically to [facility]  [phone]*, [address], [place], [place]  [address], Ph: [phone] or [phone], Fax: [phone] Rx of MIRALAX   POWD (POLYETHYLENE GLYCOL 3350) As per prep  instructions.;  #255gm x 0;  Signed;  Entered by: Barton Fanny RN;  Authorized by: Iva Boop MD;  Method used: Electronically to [facility]  [phone]*, [address] [place], [place]  [address], Ph: [phone] or [phone], Fax: [phone] Observations: Added new observation of NKA: T (07/23/2008 13:39)    Prescriptions: MIRALAX   POWD (POLYETHYLENE GLYCOL 3350) As per prep  instructions.  #255gm x 0   Entered by:   Barton Fanny RN   Authorized by:   Iva Boop MD   Signed by:   Barton Fanny RN on 07/23/2008   Method used:   Electronically to        [facility]  [phone]* (retail)       [address] [place], [place]  [address]       Ph: [phone] or [phone]       Fax: [phone]   RxID:   [id number] METOCLOPRAMIDE HCL 10 MG  TABS  (METOCLOPRAMIDE HCL) As per prep instructions.  #2 x 0   Entered by:   Barton Fanny RN   Authorized by:   Iva Boop MD   Signed by:   Barton Fanny RN on 07/23/2008   Method used:   Electronically to        [facility]  [phone]* (retail)       [address] [place], [place]  [address]       Ph: [phone] or [phone]       Fax: [phone]   RxID:   [id number] DULCOLAX 5 MG  TBEC (BISACODYL) Day before procedure take 2 at 3pm and 2 at 8pm.  #4 x 0   Entered by:   Barton Fanny RN   Authorized by:   Iva Boop MD   Signed by:   Barton Fanny RN on 07/23/2008   Method used:   Electronically to        [facility]  [phone]* (retail)       [address] [place], [place]  [address]       Ph: [phone] or [phone]       Fax: [phone]  [phone]   RxID:   [id number]
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
## 2010-12-22 NOTE — Progress Notes (Signed)
  Phone Note Call from Patient   Caller: Patient Summary of Call: Patient called requesting lab results. Patient notified of lab results and aware that result letter will be mailed out today. Patient also request 90day rx for one touch ultra test strips and lancets.Marland KitchenMarland KitchenAlvy Beal Archie CMA  May 02, 2010 9:08 AM     New/Updated Medications: ONETOUCH ULTRA TEST  STRP (GLUCOSE BLOOD) Test once daily as directed ONETOUCH ULTRASOFT LANCETS  MISC (LANCETS) test once daily as directed Prescriptions: ONETOUCH ULTRASOFT LANCETS  MISC (LANCETS) test once daily as directed  #100 x 3   Entered by:   Rock Nephew CMA   Authorized by:   Etta Grandchild MD   Signed by:   Rock Nephew CMA on 05/02/2010   Method used:   Faxed to ...       [facility] (mail-order)             , [place]         Ph:        Fax: [phone]   RxID:   [id number] ONETOUCH ULTRA TEST  STRP (GLUCOSE BLOOD) Test once daily as directed  #100 x 3   Entered and Authorized by:   Rock Nephew CMA   Signed by:   Rock Nephew CMA on 05/02/2010   Method used:   Faxed to ...       [facility] (mail-order)             , [place]         Ph:        Fax: [phone]   RxID:   [id number]

## 2010-12-22 NOTE — Letter (Signed)
Summary: Patient Notice- Polyp Results  [facility]  [address] [place], [place] [address]   Phone: [phone]  Fax: [phone]        August 07, 2007 MRN: [id number]    [facility] [address] [place], [place]  [address]    Dear Mr. Neuwirth,  The polyps removed from your colon were adenomatous. This means that they were pre-cancerous or that  they had the potential to change into cancer over time.   I recommend that you have a repeat colonoscopy in 3 years to determine if you have developed any new polyps over time. If you develop any new rectal bleeding, abdominal pain or significant bowel habit changes, please contact us before then.   Please call us if you are having persistent problems or have questions about your condition that have not been fully answered at this time.    Sincerely,  Iva Boop MD  This letter has been electronically signed by your physician.

## 2010-12-22 NOTE — Letter (Signed)
Summary: Results Follow-up Letter  [facility]  [address] [place], [place] [address]   Phone: [phone]  Fax: [phone]    05/01/2010  [address] [place], [place]  [address]  Dear Mr. Pendry,   The following are the results of your recent test(s):  Test     Result     Urine       trace of infection Blood sugars   good control Liver/kidney   normal Prostate     normal Thyroid     normal CBC       normal  _________________________________________________________  Please call for an appointment as directed _________________________________________________________ _________________________________________________________ _________________________________________________________  Sincerely,  Sanda Linger MD [facility]

## 2010-12-28 NOTE — Assessment & Plan Note (Signed)
Summary: 2 WK FU STC   Vital Signs:  Patient profile:   70 year old male Height:      76 inches Weight:      247 pounds BMI:     30.17 O2 Sat:      97 % on Room air Temp:     97.7 degrees F oral Pulse rate:   54 / minute Pulse rhythm:   regular Resp:     16 per minute BP sitting:   130 / 64  (left arm) Cuff size:   large  Vitals Entered By: Rock Nephew CMA (December 22, 2010 8:21 AM)  Nutrition Counseling: Patient's BMI is greater than 25 and therefore counseled on weight management options.  O2 Flow:  Room air  Primary Care Provider:  Etta Grandchild MD   History of Present Illness:  Follow-Up Visit      This is a 70 year old man who presents for Follow-up visit.  The patient denies chest pain, palpitations, dizziness, syncope, low blood sugar symptoms, high blood sugar symptoms, edema, SOB, DOE, PND, and orthopnea.  Since the last visit the patient notes no new problems or concerns.  The patient reports taking meds as prescribed, monitoring BP, monitoring blood sugars, and dietary compliance.  When questioned about possible medication side effects, the patient notes none.    He tells me that he is recovering nicely from the recent injury in that his chest pain has resolved and he is not having any trouble breathing. His right shoulder is not painful either. He is running on a treadmill several times a week.  Hypertension History:      He denies headache, chest pain, palpitations, dyspnea with exertion, orthopnea, PND, peripheral edema, visual symptoms, neurologic problems, syncope, and side effects from treatment.  He notes no problems with any antihypertensive medication side effects.        Positive major cardiovascular risk factors include male age 69 years old or older, diabetes, hyperlipidemia, hypertension, and family history for ischemic heart disease (females less than 43 years old).  Negative major cardiovascular risk factors include non-tobacco-user status.    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  Further assessment for target organ damage reveals no history of cardiac end-organ damage (CHF/LVH), stroke/TIA, peripheral vascular disease, renal insufficiency, or hypertensive retinopathy.    Lipid Management History:      Positive NCEP/ATP III risk factors include male age 69 years old or older, diabetes, HDL cholesterol less than 40, family history for ischemic heart disease (females less than 40 years old), hypertension, and ASHD (either angina/prior MI/prior CABG).  Negative NCEP/ATP III risk factors include non-tobacco-user status, no prior stroke/TIA, no peripheral vascular disease, and no history of aortic aneurysm.        The patient states that he knows about the "Therapeutic Lifestyle Change" diet.  His compliance with the TLC diet is fair.  The patient expresses understanding of adjunctive measures for cholesterol lowering.  Adjunctive measures started by the patient include aerobic exercise, fiber, ASA, omega-3 supplements, limit alcohol consumpton, and weight reduction.  He expresses no side effects from his lipid-lowering medication.  The patient denies any symptoms to suggest myopathy or liver disease.      Current Medications (verified): 1)  Lipitor 40 Mg Tabs (Atorvastatin Calcium) .... Take 1 Tablet By Mouth Once A Day 2)  Metoprolol Succinate 50 Mg Xr24h-Tab (Metoprolol Succinate) .Marland Kitchen.. 1 Once Daily 3)  Asa 81mg  4)  Artic  Cod Liver Oil 5)  Losartan Potassium-Hctz 100-12.5 Mg Tabs (Losartan Potassium-Hctz) .... Once Daily For Hypertension 6)  Cialis 5 Mg Tabs (Tadalafil) .... Once Daily As Directed 7)  Vitamin C 500mg  .... Take 1 Tablet By Mouth Two Times A Day 8)  Vitamin D3 5000iu/ Red Wine Extract 200mg  .... Take 1 Tablet By Mouth Once A Day 9)  Bayer Contour Monitor W/device Kit (Blood Glucose Monitoring Suppl) .... Use Two Times A Day As Directed 10)  Bayer Contour Test  Strp  (Glucose Blood) .... Use Two Times A Day As Directed 11)  Vitamin B12 12)  Vitamin E  Allergies (verified): No Known Drug Allergies  Past History:  Past Medical History: Last updated: 04/29/2010 Depression Hyperlipidemia Hypertension Fatty Liver Colonic polyps, hx of  Past Surgical History: Last updated: 04/21/2009 Vasectomy  Family History: Last updated: 04/21/2009 Family History of Alcoholism/Addiction Family History of CAD Male 1st degree relative <60 Family History Diabetes 1st degree relative  Social History: Last updated: 04/21/2009 Married Alcohol use-no Drug use-no Regular exercise-no Retired  Risk Factors: Alcohol Use: 0 (12/08/2010) >5 drinks/d w/in last 3 months: no (12/08/2010) Exercise: no (04/21/2009)  Risk Factors: Smoking Status: never (12/08/2010)  Family History: Reviewed history from 04/21/2009 and no changes required. Family History of Alcoholism/Addiction Family History of CAD Male 1st degree relative <60 Family History Diabetes 1st degree relative  Social History: Reviewed history from 04/21/2009 and no changes required. Married Alcohol use-no Drug use-no Regular exercise-no Retired  Review of Systems  The patient denies anorexia, fever, weight loss, weight gain, chest pain, syncope, dyspnea on exertion, peripheral edema, prolonged cough, headaches, hemoptysis, abdominal pain, hematuria, suspicious skin lesions, transient blindness, difficulty walking, depression, abnormal bleeding, enlarged lymph nodes, and angioedema.   Resp:  Denies chest discomfort, chest pain with inspiration, cough, coughing up blood, excessive snoring, morning headaches, pleuritic, shortness of breath, sputum productive, and wheezing. Endo:  Denies cold intolerance, excessive hunger, excessive thirst, excessive urination, heat intolerance, polyuria, and weight change.  Physical Exam  General:  alert, well-developed, well-nourished, well-hydrated,  appropriate dress, normal appearance, healthy-appearing, and good hygiene.   Head:  normocephalic, atraumatic, no abnormalities observed, and no abnormalities palpated.   Eyes:  vision grossly intact, pupils equal, and pupils round.   Mouth:  Oral mucosa and oropharynx without lesions or exudates.  Teeth in good repair. Neck:  supple, full ROM, no masses, no thyromegaly, no JVD, normal carotid upstroke, no carotid bruits, no cervical lymphadenopathy, and no neck tenderness.   Chest Wall:  No deformities, masses, tenderness or gynecomastia noted. Lungs:  normal respiratory effort, no intercostal retractions, no accessory muscle use, normal breath sounds, no dullness, no fremitus, no crackles, and no wheezes.   Heart:  Normal rate and regular rhythm. S1 and S2 normal without gallop, murmur, click, rub or other extra sounds. Abdomen:  Bowel sounds positive,abdomen soft and non-tender without masses, organomegaly or hernias noted. Msk:  normal ROM, no joint tenderness, no joint swelling, no joint warmth, no redness over joints, no joint deformities, no joint instability, no crepitation, and no muscle atrophy.   Pulses:  R and L carotid,radial,femoral,dorsalis pedis and posterior tibial pulses are full and equal bilaterally Extremities:  No clubbing, cyanosis, edema, or deformity noted with normal full range of motion of all joints.   Neurologic:  No cranial nerve deficits noted. Station and gait are normal. Plantar reflexes are down-going bilaterally. DTRs are symmetrical throughout. Sensory, motor and coordinative functions appear intact. Skin:  turgor normal, color normal,  no rashes, no suspicious lesions, no ecchymoses, no petechiae, no purpura, no ulcerations, and no edema.   Cervical Nodes:  No lymphadenopathy noted Axillary Nodes:  No palpable lymphadenopathy Inguinal Nodes:  No significant adenopathy Psych:  Cognition and judgment appear intact. Alert and cooperative with normal attention span  and concentration. No apparent delusions, illusions, hallucinations  Diabetes Management Exam:    Foot Exam (with socks and/or shoes not present):       Sensory-Pinprick/Light touch:          Left medial foot (L-4): normal          Left dorsal foot (L-5): normal          Left lateral foot (S-1): normal          Right medial foot (L-4): normal          Right dorsal foot (L-5): normal          Right lateral foot (S-1): normal       Sensory-Monofilament:          Left foot: normal          Right foot: normal       Inspection:          Left foot: normal          Right foot: normal       Nails:          Left foot: normal          Right foot: normal   Impression & Recommendations:  Problem # 1:  CLOSED FRACTURE OF RIB, UNSPECIFIED (ICD-807.00) Assessment Improved  Problem # 2:  DIABETES-TYPE 2 (ICD-250.00) Assessment: Improved  His updated medication list for this problem includes:    Losartan Potassium-hctz 100-12.5 Mg Tabs (Losartan potassium-hctz) ..... Once daily for hypertension  Orders: Venipuncture (81191) TLB-Lipid Panel (80061-LIPID) TLB-BMP (Basic Metabolic Panel-BMET) (80048-METABOL) TLB-CBC Platelet - w/Differential (85025-CBCD) TLB-Hepatic/Liver Function Pnl (80076-HEPATIC) TLB-TSH (Thyroid Stimulating Hormone) (84443-TSH) TLB-A1C / Hgb A1C (Glycohemoglobin) (83036-A1C)  Labs Reviewed: Creat: 1.0 (11/03/2010)    Reviewed HgBA1c results: 5.6 (11/03/2010)  5.9 (04/29/2010)  Problem # 3:  HYPERTENSION (ICD-401.9) Assessment: Improved  His updated medication list for this problem includes:    Metoprolol Succinate 50 Mg Xr24h-tab (Metoprolol succinate) .Marland Kitchen... 1 once daily    Losartan Potassium-hctz 100-12.5 Mg Tabs (Losartan potassium-hctz) ..... Once daily for hypertension  Orders: Venipuncture (47829) TLB-Lipid Panel (80061-LIPID) TLB-BMP (Basic Metabolic Panel-BMET) (80048-METABOL) TLB-CBC Platelet - w/Differential (85025-CBCD) TLB-Hepatic/Liver Function  Pnl (80076-HEPATIC) TLB-TSH (Thyroid Stimulating Hormone) (84443-TSH) TLB-A1C / Hgb A1C (Glycohemoglobin) (83036-A1C)  BP today: 130/64 Prior BP: 130/80 (12/08/2010)  Prior 10 Yr Risk Heart Disease: N/A (09/02/2010)  Labs Reviewed: K+: 4.3 (11/03/2010) Creat: : 1.0 (11/03/2010)   Chol: 129 (04/29/2010)   HDL: 38.50 (04/29/2010)   LDL: 66 (04/29/2010)   TG: 121.0 (04/29/2010)  Problem # 4:  HYPERLIPIDEMIA (ICD-272.4) Assessment: Improved  His updated medication list for this problem includes:    Lipitor 40 Mg Tabs (Atorvastatin calcium) .Marland Kitchen... Take 1 tablet by mouth once a day  Orders: Venipuncture (56213) TLB-Lipid Panel (80061-LIPID) TLB-BMP (Basic Metabolic Panel-BMET) (80048-METABOL) TLB-CBC Platelet - w/Differential (85025-CBCD) TLB-Hepatic/Liver Function Pnl (80076-HEPATIC) TLB-TSH (Thyroid Stimulating Hormone) (84443-TSH) TLB-A1C / Hgb A1C (Glycohemoglobin) (83036-A1C)  Labs Reviewed: SGOT: 19 (11/03/2010)   SGPT: 19 (11/03/2010)  Lipid Goals: Chol Goal: 200 (09/02/2010)   HDL Goal: 40 (09/02/2010)   LDL Goal: 70 (09/02/2010)   TG Goal: 150 (09/02/2010)  Prior 10 Yr Risk Heart Disease: N/A (09/02/2010)  HDL:38.50 (04/29/2010)  LDL:66 (04/29/2010)  Chol:129 (04/29/2010)  Trig:121.0 (04/29/2010)  Complete Medication List: 1)  Lipitor 40 Mg Tabs (Atorvastatin calcium) .... Take 1 tablet by mouth once a day 2)  Metoprolol Succinate 50 Mg Xr24h-tab (Metoprolol succinate) .Marland Kitchen.. 1 once daily 3)  Asa 81mg   4)  Artic Cod Liver Oil  5)  Losartan Potassium-hctz 100-12.5 Mg Tabs (Losartan potassium-hctz) .... Once daily for hypertension 6)  Cialis 5 Mg Tabs (Tadalafil) .... Once daily as directed 7)  Vitamin C 500mg   .... Take 1 tablet by mouth two times a day 8)  Vitamin D3 5000iu/ Red Wine Extract 200mg   .... Take 1 tablet by mouth once a day 9)  Bayer Contour Monitor W/device Kit (Blood glucose monitoring suppl) .... Use two times a day as directed 10)  Bayer Contour Test  Strp (Glucose blood) .... Use two times a day as directed 11)  Vitamin B12  12)  Vitamin E   Hypertension Assessment/Plan:      The patient's hypertensive risk group is category C: Target organ damage and/or diabetes.  Today's blood pressure is 130/64.  His blood pressure goal is < 140/90.  Lipid Assessment/Plan:      Based on NCEP/ATP III, the patient's risk factor category is "history of coronary disease, peripheral vascular disease, cerebrovascular disease, or aortic aneurysm along with either diabetes, current smoker, or LDL > 130 plus HDL < 40 plus triglycerides > 200".  The patient's lipid goals are as follows: Total cholesterol goal is 200; LDL cholesterol goal is 70; HDL cholesterol goal is 40; Triglyceride goal is 150.    Patient Instructions: 1)  Please schedule a follow-up appointment in 3 months. 2)  It is important that you exercise regularly at least 20 minutes 5 times a week. If you develop chest pain, have severe difficulty breathing, or feel very tired , stop exercising immediately and seek medical attention. 3)  You need to lose weight. Consider a lower calorie diet and regular exercise.  4)  Check your blood sugars regularly. If your readings are usually above 200 or below 70 you should contact our office. 5)  It is important that your Diabetic A1c level is checked every 3 months. 6)  See your eye doctor yearly to check for diabetic eye damage. 7)  Check your feet each night for sore areas, calluses or signs of infection. 8)  Check your Blood Pressure regularly. If it is above 130/80: you should make an appointment. Prescriptions: LOSARTAN POTASSIUM-HCTZ 100-12.5 MG TABS (LOSARTAN POTASSIUM-HCTZ) once daily for hypertension  #90 x 3   Entered by:   Rock Nephew CMA   Authorized by:   Etta Grandchild MD   Signed by:   Rock Nephew CMA on 12/22/2010   Method used:   Print then Give to Patient   RxID:   [id number] METOPROLOL SUCCINATE 50 MG XR24H-TAB (METOPROLOL  SUCCINATE) 1 once daily  #90 Tablet x 3   Entered by:   Rock Nephew CMA   Authorized by:   Etta Grandchild MD   Signed by:   Rock Nephew CMA on 12/22/2010   Method used:   Print then Give to Patient   RxID:   [id number]    Orders Added: 1)  Venipuncture [84696] 2)  TLB-Lipid Panel [80061-LIPID] 3)  TLB-BMP (Basic Metabolic Panel-BMET) [80048-METABOL] 4)  TLB-CBC Platelet - w/Differential [85025-CBCD] 5)  TLB-Hepatic/Liver Function Pnl [80076-HEPATIC] 6)  TLB-TSH (Thyroid Stimulating Hormone) [84443-TSH] 7)  TLB-A1C / Hgb A1C (Glycohemoglobin) [29528-U1L]  8)  Est. Patient Level IV [60454]

## 2010-12-28 NOTE — Letter (Signed)
Summary: Lipid Letter  [facility]  [address] [place], [place] [address]   Phone: [phone]  Fax: [phone]    12/22/2010  Emigdio Wildeman [address] [name], [place]  [address]  Dear Zollie Beckers:  We have carefully reviewed your last lipid profile from 12/22/2010 and the results are noted below with a summary of recommendations for lipid management.    Cholesterol:       101     Goal: <200   HDL "good" Cholesterol:   57.32     Goal: >40   LDL "bad" Cholesterol:   56     Goal: <70   Triglycerides:       58.0     Goal: <150    your other labs look great    TLC Diet (Therapeutic Lifestyle Change): Saturated Fats & Transfatty acids should be kept < 7% of total calories ***Reduce Saturated Fats Polyunstaurated Fat can be up to 10% of total calories Monounsaturated Fat Fat can be up to 20% of total calories Total Fat should be no greater than 25-35% of total calories Carbohydrates should be 50-60% of total calories Protein should be approximately 15% of total calories Fiber should be at least 20-30 grams a day ***Increased fiber may help lower LDL Total Cholesterol should be < 200mg /day Consider adding plant stanol/sterols to diet (example: Benacol spread) ***A higher intake of unsaturated fat may reduce Triglycerides and Increase HDL    Adjunctive Measures (may lower LIPIDS and reduce risk of Heart Attack) include: Aerobic Exercise (20-30 minutes 3-4 times a week) Limit Alcohol Consumption Weight Reduction Aspirin 75-81 mg a day by mouth (if not allergic or contraindicated) Dietary Fiber 20-30 grams a day by mouth     Current Medications: 1)    Lipitor 40 Mg Tabs (Atorvastatin calcium) .... Take 1 tablet by mouth once a day 2)    Metoprolol Succinate 50 Mg Xr24h-tab (Metoprolol succinate) .Marland Kitchen.. 1 once daily 3)    Asa 81mg   4)    Artic Cod Liver Oil  5)    Losartan Potassium-hctz 100-12.5 Mg Tabs (Losartan potassium-hctz) .... Once daily for  hypertension 6)    Cialis 5 Mg Tabs (Tadalafil) .... Once daily as directed 7)    Vitamin C 500mg   .... Take 1 tablet by mouth two times a day 8)    Vitamin D3 5000iu/ Red Wine Extract 200mg   .... Take 1 tablet by mouth once a day 9)    Bayer Contour Monitor W/device Kit (Blood glucose monitoring suppl) .... Use two times a day as directed 10)    Bayer Contour Test  Strp (Glucose blood) .... Use two times a day as directed 11)    Vitamin B12  12)    Vitamin E   If you have any questions, please call. We appreciate being able to work with you.   Sincerely,    [facility] Etta Grandchild MD

## 2011-02-02 LAB — COMPREHENSIVE METABOLIC PANEL
ALT: 32 U/L (ref 0–53)
AST: 41 U/L — ABNORMAL HIGH (ref 0–37)
Albumin: 4.6 g/dL (ref 3.5–5.2)
Alkaline Phosphatase: 53 U/L (ref 39–117)
BUN: 23 mg/dL (ref 6–23)
CO2: 27 mEq/L (ref 19–32)
Calcium: 9.9 mg/dL (ref 8.4–10.5)
Chloride: 105 mEq/L (ref 96–112)
Creatinine, Ser: 1.2 mg/dL (ref 0.4–1.5)
GFR calc Af Amer: >60 mL/min (ref >60)
GFR calc non Af Amer: >60 mL/min (ref >60)
Glucose, Bld: 102 mg/dL — ABNORMAL HIGH (ref 70–99)
Potassium: 4.1 mEq/L (ref 3.5–5.1)
Sodium: 141 mEq/L (ref 135–145)
Total Bilirubin: 1.4 mg/dL — ABNORMAL HIGH (ref 0.3–1.2)
Total Protein: 7.6 g/dL (ref 6.0–8.3)

## 2011-02-02 LAB — CBC
HCT: 44.4 % (ref 39.0–52.0)
Hemoglobin: 15.1 g/dL (ref 13.0–17.0)
MCH: 30.9 pg (ref 26.0–34.0)
MCHC: 34 g/dL (ref 30.0–36.0)
MCV: 90.8 fL (ref 78.0–100.0)
Platelets: 200 10*3/uL (ref 150–400)
RBC: 4.89 MIL/uL (ref 4.22–5.81)
RDW: 13.7 % (ref 11.5–15.5)
WBC: 15.3 10*3/uL — ABNORMAL HIGH (ref 4.0–10.5)

## 2011-02-02 LAB — DIFFERENTIAL
Basophils Absolute: 0 10*3/uL (ref 0.0–0.1)
Basophils Relative: 0 % (ref 0–1)
Eosinophils Absolute: 0.1 10*3/uL (ref 0.0–0.7)
Eosinophils Relative: 0 % (ref 0–5)
Lymphocytes Relative: 7 % — ABNORMAL LOW (ref 12–46)
Lymphs Abs: 1.1 10*3/uL (ref 0.7–4.0)
Monocytes Absolute: 1.3 10*3/uL — ABNORMAL HIGH (ref 0.1–1.0)
Monocytes Relative: 8 % (ref 3–12)
Neutro Abs: 12.9 10*3/uL — ABNORMAL HIGH (ref 1.7–7.7)
Neutrophils Relative %: 84 % — ABNORMAL HIGH (ref 43–77)

## 2011-02-02 LAB — URINALYSIS, ROUTINE W REFLEX MICROSCOPIC
Bilirubin Urine: NEGATIVE
Glucose, UA: NEGATIVE mg/dL
Hgb urine dipstick: NEGATIVE
Ketones, ur: 40 mg/dL — AB
Nitrite: NEGATIVE
Protein, ur: NEGATIVE mg/dL
Specific Gravity, Urine: 1.023 (ref 1.005–1.030)
Urobilinogen, UA: 0.2 mg/dL (ref 0.0–1.0)
pH: 5 (ref 5.0–8.0)

## 2011-03-06 ENCOUNTER — Encounter: Payer: Self-pay | Admitting: Internal Medicine

## 2011-03-06 ENCOUNTER — Ambulatory Visit (INDEPENDENT_AMBULATORY_CARE_PROVIDER_SITE_OTHER): Payer: Medicare Other | Admitting: Internal Medicine

## 2011-03-06 ENCOUNTER — Other Ambulatory Visit (INDEPENDENT_AMBULATORY_CARE_PROVIDER_SITE_OTHER): Payer: Medicare Other

## 2011-03-06 DIAGNOSIS — E119 Type 2 diabetes mellitus without complications: Secondary | ICD-10-CM

## 2011-03-06 DIAGNOSIS — I10 Essential (primary) hypertension: Secondary | ICD-10-CM

## 2011-03-06 LAB — BASIC METABOLIC PANEL
BUN: 18 mg/dL (ref 6–23)
CO2: 32 mEq/L (ref 19–32)
Calcium: 9.4 mg/dL (ref 8.4–10.5)
Chloride: 105 mEq/L (ref 96–112)
Creatinine, Ser: 0.9 mg/dL (ref 0.4–1.5)
GFR: 85.56 mL/min (ref >60.00)
Glucose, Bld: 88 mg/dL (ref 70–99)
Potassium: 4.4 mEq/L (ref 3.5–5.1)
Sodium: 142 mEq/L (ref 135–145)

## 2011-03-06 LAB — HEMOGLOBIN A1C: Hgb A1c MFr Bld: 6 % (ref 4.6–6.5)

## 2011-03-06 MED ORDER — GLUCOSE BLOOD VI STRP: ORAL_STRIP | Status: DC

## 2011-03-06 MED ORDER — LANCETS MISC: Status: DC

## 2011-03-06 NOTE — Assessment & Plan Note (Signed)
It sounds like he is doing well, I will check his A1C level today

## 2011-03-06 NOTE — Assessment & Plan Note (Signed)
His BP is well controlled I will check his lytes and renal function today 

## 2011-03-06 NOTE — Progress Notes (Signed)
Subjective:    Patient ID: Thomas Chung, male    DOB: [birth year], 70 y.o.   MRN: [id number]  Diabetes He presents for his follow-up diabetic visit. He has type 2 diabetes mellitus. No MedicAlert identification noted. His disease course has been improving. There are no hypoglycemic associated symptoms. Pertinent negatives for hypoglycemia include no confusion, nervousness/anxiousness or pallor. Pertinent negatives for diabetes include no blurred vision, no chest pain, no fatigue, no foot paresthesias, no foot ulcerations, no polydipsia, no polyphagia, no polyuria, no visual change, no weakness and no weight loss. There are no hypoglycemic complications. Symptoms are stable. There are no diabetic complications. Current diabetic treatment includes diet. He is compliant with treatment all of the time. His weight is decreasing steadily. He is following a diabetic diet. Meal planning includes avoidance of concentrated sweets. He participates in exercise daily. His home blood glucose trend is decreasing steadily. His breakfast blood glucose range is generally 70-90 mg/dl. His highest blood glucose is 90-110 mg/dl. An ACE inhibitor/angiotensin II receptor blocker is being taken. He sees a podiatrist.Eye exam is current.      Review of Systems  Constitutional: Negative for fever, chills, weight loss, diaphoresis, activity change, appetite change, fatigue and unexpected weight change.  HENT: Negative for facial swelling, neck pain and neck stiffness.   Eyes: Negative for blurred vision.  Respiratory: Negative for apnea, cough, choking, chest tightness, shortness of breath, wheezing and stridor.   Cardiovascular: Negative for chest pain, palpitations and leg swelling.  Gastrointestinal: Negative for nausea, vomiting, abdominal pain, diarrhea, constipation, blood in stool and abdominal distention.  Genitourinary: Negative for dysuria, urgency, polyuria, frequency, hematuria, flank pain, enuresis and  difficulty urinating.  Musculoskeletal: Negative for myalgias, back pain, joint swelling, arthralgias and gait problem.  Skin: Negative for color change, pallor, rash and wound.  Neurological: Negative for weakness.  Hematological: Negative for polydipsia, polyphagia and adenopathy. Does not bruise/bleed easily.  Psychiatric/Behavioral: Negative for behavioral problems, confusion, dysphoric mood and agitation. The patient is not nervous/anxious.        Objective:   Physical Exam  Constitutional: He is oriented to person, place, and time. He appears well-developed and well-nourished. No distress.  HENT:  Head: Normocephalic and atraumatic.  Right Ear: External ear normal.  Left Ear: External ear normal.  Nose: Nose normal.  Mouth/Throat: No oropharyngeal exudate.  Eyes: Conjunctivae and EOM are normal. Pupils are equal, round, and reactive to light. Right eye exhibits no discharge. Left eye exhibits no discharge. No scleral icterus.  Neck: Normal range of motion. Neck supple. No JVD present. No thyromegaly present.  Cardiovascular: Normal rate, regular rhythm, normal heart sounds and intact distal pulses.  Exam reveals no gallop and no friction rub.   No murmur heard. Pulmonary/Chest: Effort normal and breath sounds normal. No respiratory distress. He has no wheezes. He has no rales. He exhibits no tenderness.  Abdominal: Soft. Bowel sounds are normal. He exhibits no distension and no mass. There is no tenderness. There is no rebound and no guarding.  Musculoskeletal: Normal range of motion. He exhibits no edema and no tenderness.  Lymphadenopathy:    He has no cervical adenopathy.  Neurological: He is alert and oriented to person, place, and time. He has normal reflexes. He displays normal reflexes.  Skin: Skin is warm and dry. No rash noted. He is not diaphoretic. No erythema. No pallor.  Psychiatric: He has a normal mood and affect. His behavior is normal. Judgment and thought content  normal.  Lab Results  Component Value Date   WBC 6.6 12/22/2010   HGB 14.0 12/22/2010   HCT 40.0 12/22/2010   PLT 196.0 12/22/2010   CHOL 101 12/22/2010   TRIG 58.0 12/22/2010   HDL 33.90* 12/22/2010   ALT 19 12/22/2010   AST 20 12/22/2010   NA 137 12/22/2010   K 4.2 12/22/2010   CL 103 12/22/2010   CREATININE 1.0 12/22/2010   BUN 23 12/22/2010   CO2 28 12/22/2010   TSH 1.09 12/22/2010   PSA 0.11 04/29/2010   HGBA1C 6.1 12/22/2010      Assessment & Plan:

## 2011-03-06 NOTE — Patient Instructions (Signed)

## 2011-04-21 ENCOUNTER — Encounter: Payer: Self-pay | Admitting: Internal Medicine

## 2011-04-21 ENCOUNTER — Ambulatory Visit (INDEPENDENT_AMBULATORY_CARE_PROVIDER_SITE_OTHER): Payer: Medicare Other | Admitting: Internal Medicine

## 2011-04-21 ENCOUNTER — Other Ambulatory Visit (INDEPENDENT_AMBULATORY_CARE_PROVIDER_SITE_OTHER): Payer: Medicare Other

## 2011-04-21 VITALS — BP 108/58 | HR 53 | Temp 97.0°F | Resp 16 | Wt 222.0 lb

## 2011-04-21 DIAGNOSIS — R197 Diarrhea, unspecified: Secondary | ICD-10-CM

## 2011-04-21 DIAGNOSIS — I10 Essential (primary) hypertension: Secondary | ICD-10-CM

## 2011-04-21 DIAGNOSIS — A09 Infectious gastroenteritis and colitis, unspecified: Secondary | ICD-10-CM

## 2011-04-21 DIAGNOSIS — E119 Type 2 diabetes mellitus without complications: Secondary | ICD-10-CM

## 2011-04-21 DIAGNOSIS — E785 Hyperlipidemia, unspecified: Secondary | ICD-10-CM

## 2011-04-21 LAB — CBC WITH DIFFERENTIAL/PLATELET
Basophils Absolute: 0 10*3/uL (ref 0.0–0.1)
Basophils Relative: 0.5 % (ref 0.0–3.0)
Eosinophils Absolute: 0.3 10*3/uL (ref 0.0–0.7)
Eosinophils Relative: 3.7 % (ref 0.0–5.0)
HCT: 39.2 % (ref 39.0–52.0)
Hemoglobin: 13.4 g/dL (ref 13.0–17.0)
Lymphocytes Relative: 13.8 % (ref 12.0–46.0)
Lymphs Abs: 1 10*3/uL (ref 0.7–4.0)
MCHC: 34.1 g/dL (ref 30.0–36.0)
MCV: 91.1 fl (ref 78.0–100.0)
Monocytes Absolute: 0.7 10*3/uL (ref 0.1–1.0)
Monocytes Relative: 9.9 % (ref 3.0–12.0)
Neutro Abs: 5.4 10*3/uL (ref 1.4–7.7)
Neutrophils Relative %: 72.1 % (ref 43.0–77.0)
Platelets: 208 10*3/uL (ref 150.0–400.0)
RBC: 4.3 Mil/uL (ref 4.22–5.81)
RDW: 14.2 % (ref 11.5–14.6)
WBC: 7.5 10*3/uL (ref 4.5–10.5)

## 2011-04-21 LAB — TSH: TSH: 0.86 u[IU]/mL (ref 0.35–5.50)

## 2011-04-21 LAB — COMPREHENSIVE METABOLIC PANEL
ALT: 21 U/L (ref 0–53)
AST: 18 U/L (ref 0–37)
Albumin: 3.8 g/dL (ref 3.5–5.2)
Alkaline Phosphatase: 56 U/L (ref 39–117)
BUN: 19 mg/dL (ref 6–23)
CO2: 27 mEq/L (ref 19–32)
Calcium: 9.2 mg/dL (ref 8.4–10.5)
Chloride: 106 mEq/L (ref 96–112)
Creatinine, Ser: 1 mg/dL (ref 0.4–1.5)
GFR: 78.66 mL/min (ref >60.00)
Glucose, Bld: 95 mg/dL (ref 70–99)
Potassium: 3.6 mEq/L (ref 3.5–5.1)
Sodium: 139 mEq/L (ref 135–145)
Total Bilirubin: 1.1 mg/dL (ref 0.3–1.2)
Total Protein: 6.1 g/dL (ref 6.0–8.3)

## 2011-04-21 LAB — HEMOGLOBIN A1C: Hgb A1c MFr Bld: 5.9 % (ref 4.6–6.5)

## 2011-04-21 LAB — SEDIMENTATION RATE: Sed Rate: 11 mm/hr (ref 0–22)

## 2011-04-21 LAB — HIGH SENSITIVITY CRP: CRP, High Sensitivity: 8.13 mg/L — ABNORMAL HIGH (ref 0.00–5.00)

## 2011-04-21 MED ORDER — METRONIDAZOLE 500 MG PO TABS: 500.0000 mg | ORAL_TABLET | Freq: Three times a day (TID) | ORAL | Status: AC

## 2011-04-21 MED ORDER — CIPROFLOXACIN HCL 500 MG PO TABS: 500.0000 mg | ORAL_TABLET | Freq: Two times a day (BID) | ORAL | Status: AC

## 2011-04-21 NOTE — Patient Instructions (Signed)
Diarrhea Diarrhea can be caused by many conditions. The most common cause is a virus.  Other causes include:  Food poisoning.   Bacterial infection.   Reactions to medicine (especially antibiotics and antacids).  Most of the time diarrhea improves after 2-3 days of rest and oral fluid replacement.  Drink enough clear fluids (water, sodas, Gatorade) to prevent dehydration. Adults must drink at least 2-3 quarts daily. Solid foods and dairy products should be avoided until you improve. Then begin with bland foods such as bananas, rice, applesauce, dry toast, crackers or other starches. Avoid spicy or fatty foods, caffeine and alcohol for several days. Medicine to control cramping and diarrhea may be helpful. If you have a fever or blood in the stool, avoid these medicines. They may prolong your illness. Antibiotics can speed recovery from diarrhea due to some bacterial infections, but may cause complications.  SEEK MEDICAL CARE IF:  Your diarrhea does not get better in 3 days.   You have fever.   You have blood in the stool.   You develop vomiting.   You become more dehydrated.  Document Released: 11/06/2005 Document Re-Released: 05/20/2007 [employer] Patient Information 2011 [employer]. 

## 2011-04-21 NOTE — Assessment & Plan Note (Signed)
I will monitor his diabetes with an A1C and continue current meds for now

## 2011-04-21 NOTE — Assessment & Plan Note (Signed)
I will empirically start him on cipro/flagyl to treat routine bacteria as well as unusual pathogens such as C diff and giardia, I will check his labs today to see if he is dehydrated (CMP) or has any evidence of inflammation (CRP)

## 2011-04-21 NOTE — Progress Notes (Signed)
Subjective:    Patient ID: Thomas Chung, male    DOB: 1941-10-18, 70 y.o.   MRN: [id number]  Diarrhea  This is a new problem. The current episode started in the past 7 days. The problem occurs 5 to 10 times per day. The problem has been gradually worsening. The stool consistency is described as mucous and watery. Associated symptoms include chills and weight loss. Pertinent negatives include no abdominal pain, arthralgias, bloating, coughing, fever, headaches, increased  flatus, myalgias, sweats, URI or vomiting. The symptoms are aggravated by nothing. Risk factors include suspect food intake. He has tried nothing for the symptoms.      Review of Systems  Constitutional: Positive for chills and weight loss. Negative for fever, diaphoresis, activity change, appetite change, fatigue and unexpected weight change.  HENT: Negative for facial swelling, trouble swallowing, neck pain, neck stiffness and voice change.   Eyes: Negative for photophobia and visual disturbance.  Respiratory: Negative for cough, chest tightness, shortness of breath, wheezing and stridor.   Cardiovascular: Negative for chest pain, palpitations and leg swelling.  Gastrointestinal: Positive for diarrhea. Negative for nausea, vomiting, abdominal pain, constipation, blood in stool, abdominal distention, bloating and flatus.  Genitourinary: Negative for dysuria, urgency, frequency, hematuria, flank pain, decreased urine volume, enuresis and difficulty urinating.  Musculoskeletal: Negative for myalgias, back pain, joint swelling, arthralgias and gait problem.  Skin: Negative for color change, pallor and rash.  Neurological: Negative for dizziness, tremors, seizures, syncope, facial asymmetry, speech difficulty, weakness, light-headedness, numbness and headaches.  Hematological: Negative for adenopathy. Does not bruise/bleed easily.  Psychiatric/Behavioral: Negative for suicidal ideas, hallucinations, behavioral problems,  confusion, sleep disturbance, self-injury, dysphoric mood, decreased concentration and agitation. The patient is not nervous/anxious and is not hyperactive.        Objective:   Physical Exam  Vitals reviewed. Constitutional: He is oriented to person, place, and time. He appears well-developed and well-nourished. No distress.  HENT:  Head: Normocephalic and atraumatic.  Right Ear: External ear normal.  Left Ear: External ear normal.  Nose: Nose normal.  Mouth/Throat: No oropharyngeal exudate.  Eyes: Conjunctivae and EOM are normal. Pupils are equal, round, and reactive to light. Right eye exhibits no discharge. Left eye exhibits no discharge. No scleral icterus.  Neck: Normal range of motion. Neck supple. No JVD present. No tracheal deviation present. No thyromegaly present.  Cardiovascular: Normal rate, regular rhythm, normal heart sounds and intact distal pulses.  Exam reveals no gallop and no friction rub.   No murmur heard. Pulmonary/Chest: Effort normal and breath sounds normal. No stridor. No respiratory distress. He has no wheezes. He has no rales. He exhibits no tenderness.  Abdominal: Soft. Bowel sounds are normal. He exhibits no distension and no mass. There is no tenderness. There is no rebound and no guarding.  Musculoskeletal: Normal range of motion. He exhibits no edema and no tenderness.  Lymphadenopathy:    He has no cervical adenopathy.  Neurological: He is alert and oriented to person, place, and time. He has normal reflexes. No cranial nerve deficit.  Skin: Skin is warm and dry. No rash noted. He is not diaphoretic. No erythema. No pallor.  Psychiatric: He has a normal mood and affect. His behavior is normal. Judgment and thought content normal.        Lab Results  Component Value Date   WBC 6.6 12/22/2010   HGB 14.0 12/22/2010   HCT 40.0 12/22/2010   PLT 196.0 12/22/2010   CHOL 101 12/22/2010   TRIG 58.0  12/22/2010   HDL 33.90* 12/22/2010   ALT 19 12/22/2010   AST 20  12/22/2010   NA 142 03/06/2011   K 4.4 03/06/2011   CL 105 03/06/2011   CREATININE 0.9 03/06/2011   BUN 18 03/06/2011   CO2 32 03/06/2011   TSH 1.09 12/22/2010   PSA 0.11 04/29/2010   HGBA1C 6.0 03/06/2011    Assessment & Plan:

## 2011-04-21 NOTE — Assessment & Plan Note (Signed)
His BP is well controlled, I will check his lytes and renal function today 

## 2011-04-21 NOTE — Assessment & Plan Note (Signed)
He is doing well on lipitor, will check his labs today

## 2011-05-05 ENCOUNTER — Encounter: Payer: Self-pay | Admitting: Internal Medicine

## 2011-05-08 ENCOUNTER — Encounter: Payer: Self-pay | Admitting: Internal Medicine

## 2011-05-08 ENCOUNTER — Ambulatory Visit (INDEPENDENT_AMBULATORY_CARE_PROVIDER_SITE_OTHER): Payer: Medicare Other | Admitting: Internal Medicine

## 2011-05-08 DIAGNOSIS — E119 Type 2 diabetes mellitus without complications: Secondary | ICD-10-CM

## 2011-05-08 DIAGNOSIS — I10 Essential (primary) hypertension: Secondary | ICD-10-CM

## 2011-05-08 NOTE — Progress Notes (Signed)
  Subjective:    Patient ID: Thomas Chung, male    DOB: 1940-11-22, 70 y.o.   MRN: [id number]  HPI  He returns for f/up and is happy to report that his diarrhea has resolved. He feels well and offers no complaints.  Review of Systems  Constitutional: Negative.   HENT: Negative.   Eyes: Negative.   Respiratory: Negative.   Cardiovascular: Negative.   Gastrointestinal: Negative.   Genitourinary: Negative.   Musculoskeletal: Negative.   Skin: Negative.   Neurological: Negative.   Hematological: Negative.   Psychiatric/Behavioral: Negative.        Objective:   Physical Exam  Vitals reviewed. Constitutional: He is oriented to person, place, and time. He appears well-developed and well-nourished. No distress.  HENT:  Mouth/Throat: No oropharyngeal exudate.  Eyes: Right eye exhibits no discharge. Left eye exhibits no discharge. No scleral icterus.  Neck: Normal range of motion. Neck supple. No JVD present. No tracheal deviation present. No thyromegaly present.  Cardiovascular: Normal rate, regular rhythm, normal heart sounds and intact distal pulses.  Exam reveals no gallop and no friction rub.   No murmur heard. Pulmonary/Chest: Effort normal and breath sounds normal. No stridor. No respiratory distress. He has no wheezes. He has no rales. He exhibits no tenderness.  Abdominal: Soft. Bowel sounds are normal. He exhibits no distension and no mass. There is no tenderness. There is no rebound and no guarding.  Musculoskeletal: Normal range of motion. He exhibits no edema and no tenderness.  Lymphadenopathy:    He has no cervical adenopathy.  Neurological: He is alert and oriented to person, place, and time. He has normal reflexes. He displays normal reflexes. No cranial nerve deficit. He exhibits normal muscle tone.  Skin: Skin is warm and dry. No rash noted. He is not diaphoretic. No erythema. No pallor.  Psychiatric: He has a normal mood and affect. His behavior is normal.  Judgment and thought content normal.         Lab Results  Component Value Date   WBC 7.5 04/21/2011   HGB 13.4 04/21/2011   HCT 39.2 04/21/2011   PLT 208.0 04/21/2011   CHOL 101 12/22/2010   TRIG 58.0 12/22/2010   HDL 33.90* 12/22/2010   ALT 21 04/21/2011   AST 18 04/21/2011   NA 139 04/21/2011   K 3.6 04/21/2011   CL 106 04/21/2011   CREATININE 1.0 04/21/2011   BUN 19 04/21/2011   CO2 27 04/21/2011   TSH 0.86 04/21/2011   PSA 0.11 04/29/2010   HGBA1C 5.9 04/21/2011   Assessment & Plan:

## 2011-05-08 NOTE — Assessment & Plan Note (Signed)
BP is well controlled 

## 2011-05-08 NOTE — Patient Instructions (Signed)
Diabetes, Type 2 Diabetes is a lasting (chronic) disease. In type 2 diabetes, the pancreas does not make enough insulin (a hormone), and the body does not respond normally to the insulin that is made. This type of diabetes was also previously called adult onset diabetes. About 90% of all those who have diabetes have type 2. It usually occurs after the age of 40 but can occur at any age. CAUSES Unlike type 1 diabetes, which happens because insulin is no longer being made, type 2 diabetes happens because the body is making less insulin and has trouble using the insulin properly. SYMPTOMS  Drinking more than usual.   Urinating more than usual.   Blurred vision.   Dry, itchy skin.   Frequent infection like yeast infections in women.   More tired than usual (fatigue).  TREATMENT  Healthy eating.   Exercise.   Medication, if needed.   Monitoring blood glucose (sugar).   Seeing your caregiver regularly.  HOME CARE INSTRUCTIONS  Check your blood glucose (sugar) at least once daily. More frequent monitoring may be necessary, depending on your medications and on how well your diabetes is controlled. Your caregiver will advise you.   Take your medicine as directed by your caregiver.   Do not smoke.   Make wise food choices. Ask your caregiver for information. Weight loss can improve your diabetes.   Learn about low blood glucose (hypoglycemia) and how to treat it.   Get your eyes checked regularly.   Have a yearly physical exam. Have your blood pressure checked. Get your blood and urine tested.   Wear a pendant or bracelet saying that you have diabetes.   Check your feet every night for sores. Let your caregiver know if you have sores that are not healing.  SEEK MEDICAL CARE IF:  You are having problems keeping your blood glucose at target range.   You feel you might be having problems with your medicines.   You have symptoms of an illness that is not improving after 24  hours.   You have a sore or wound that is not healing.   You notice a change in vision or a new problem with your vision.   You develop a fever of more than 100.5.  Document Released: 11/06/2005 Document Re-Released: 11/28/2009 [employer] Patient Information 2011 [employer]. 

## 2011-05-08 NOTE — Assessment & Plan Note (Signed)
His last A1C shows good blood sugar control

## 2011-06-05 ENCOUNTER — Other Ambulatory Visit: Payer: Self-pay | Admitting: Internal Medicine

## 2011-07-06 ENCOUNTER — Ambulatory Visit: Payer: Medicare Other | Admitting: Internal Medicine

## 2011-07-17 ENCOUNTER — Encounter: Payer: Self-pay | Admitting: Internal Medicine

## 2011-07-17 ENCOUNTER — Ambulatory Visit (INDEPENDENT_AMBULATORY_CARE_PROVIDER_SITE_OTHER): Payer: Medicare Other | Admitting: Internal Medicine

## 2011-07-17 ENCOUNTER — Other Ambulatory Visit (INDEPENDENT_AMBULATORY_CARE_PROVIDER_SITE_OTHER): Payer: Medicare Other

## 2011-07-17 DIAGNOSIS — I10 Essential (primary) hypertension: Secondary | ICD-10-CM

## 2011-07-17 DIAGNOSIS — E119 Type 2 diabetes mellitus without complications: Secondary | ICD-10-CM

## 2011-07-17 DIAGNOSIS — Z0000 Encounter for general adult medical examination without abnormal findings: Secondary | ICD-10-CM | POA: Insufficient documentation

## 2011-07-17 DIAGNOSIS — N138 Other obstructive and reflux uropathy: Secondary | ICD-10-CM | POA: Insufficient documentation

## 2011-07-17 DIAGNOSIS — E785 Hyperlipidemia, unspecified: Secondary | ICD-10-CM

## 2011-07-17 DIAGNOSIS — N400 Enlarged prostate without lower urinary tract symptoms: Secondary | ICD-10-CM

## 2011-07-17 LAB — PSA: PSA: 0.1 ng/mL (ref 0.10–4.00)

## 2011-07-17 LAB — COMPREHENSIVE METABOLIC PANEL
ALT: 25 U/L (ref 0–53)
AST: 20 U/L (ref 0–37)
Albumin: 4.3 g/dL (ref 3.5–5.2)
Alkaline Phosphatase: 52 U/L (ref 39–117)
BUN: 26 mg/dL — ABNORMAL HIGH (ref 6–23)
CO2: 30 mEq/L (ref 19–32)
Calcium: 9.6 mg/dL (ref 8.4–10.5)
Chloride: 103 mEq/L (ref 96–112)
Creatinine, Ser: 1 mg/dL (ref 0.4–1.5)
GFR: 83.4 mL/min (ref >60.00)
Glucose, Bld: 86 mg/dL (ref 70–99)
Potassium: 4.1 mEq/L (ref 3.5–5.1)
Sodium: 140 mEq/L (ref 135–145)
Total Bilirubin: 1.2 mg/dL (ref 0.3–1.2)
Total Protein: 6.3 g/dL (ref 6.0–8.3)

## 2011-07-17 LAB — HEMOGLOBIN A1C: Hgb A1c MFr Bld: 5.9 % (ref 4.6–6.5)

## 2011-07-17 NOTE — Assessment & Plan Note (Signed)
BP is well controlled, I will check his renal function and lytes today

## 2011-07-17 NOTE — Assessment & Plan Note (Signed)

## 2011-07-17 NOTE — Patient Instructions (Signed)
Diabetes, Type 2 Diabetes is a lasting (chronic) disease. In type 2 diabetes, the pancreas does not make enough insulin (a hormone), and the body does not respond normally to the insulin that is made. This type of diabetes was also previously called adult onset diabetes. About 90% of all those who have diabetes have type 2. It usually occurs after the age of 40 but can occur at any age. CAUSES Unlike type 1 diabetes, which happens because insulin is no longer being made, type 2 diabetes happens because the body is making less insulin and has trouble using the insulin properly. SYMPTOMS  Drinking more than usual.   Urinating more than usual.   Blurred vision.   Dry, itchy skin.   Frequent infection like yeast infections in women.   More tired than usual (fatigue).  TREATMENT  Healthy eating.   Exercise.   Medication, if needed.   Monitoring blood glucose (sugar).   Seeing your caregiver regularly.  HOME CARE INSTRUCTIONS  Check your blood glucose (sugar) at least once daily. More frequent monitoring may be necessary, depending on your medications and on how well your diabetes is controlled. Your caregiver will advise you.   Take your medicine as directed by your caregiver.   Do not smoke.   Make wise food choices. Ask your caregiver for information. Weight loss can improve your diabetes.   Learn about low blood glucose (hypoglycemia) and how to treat it.   Get your eyes checked regularly.   Have a yearly physical exam. Have your blood pressure checked. Get your blood and urine tested.   Wear a pendant or bracelet saying that you have diabetes.   Check your feet every night for sores. Let your caregiver know if you have sores that are not healing.  SEEK MEDICAL CARE IF:  You are having problems keeping your blood glucose at target range.   You feel you might be having problems with your medicines.   You have symptoms of an illness that is not improving after 24  hours.   You have a sore or wound that is not healing.   You notice a change in vision or a new problem with your vision.   You develop a fever of more than 100.5.  Document Released: 11/06/2005 Document Re-Released: 11/28/2009 [employer] Patient Information 2011 [employer].Hypertension (High Blood Pressure) As your heart beats, it forces blood through your arteries. This force is your blood pressure. If the pressure is too high, it is called hypertension (HTN) or high blood pressure. HTN is dangerous because you may have it and not know it. High blood pressure may mean that your heart has to work harder to pump blood. Your arteries may be narrow or stiff. The extra work puts you at risk for heart disease, stroke, and other problems.  Blood pressure consists of two numbers, a higher number over a lower, 110/72, for example. It is stated as "110 over 72." The ideal is below 120 for the top number (systolic) and under 80 for the bottom (diastolic). Write down your blood pressure today. You should pay close attention to your blood pressure if you have certain conditions such as:  Heart failure.  Prior heart attack.   Diabetes   Chronic kidney disease.   Prior stroke.   Multiple risk factors for heart disease.   To see if you have HTN, your blood pressure should be measured while you are seated with your arm held at the level of the heart. It   should be measured at least twice. A one-time elevated blood pressure reading (especially in the Emergency Department) does not mean that you need treatment. There may be conditions in which the blood pressure is different between your right and left arms. It is important to see your caregiver soon for a recheck. Most people have essential hypertension which means that there is not a specific cause. This type of high blood pressure may be lowered by changing lifestyle factors such as:  Stress.  Smoking.   Lack of exercise.   Excessive  weight.  Drug/tobacco/alcohol use.   Eating less salt.   Most people do not have symptoms from high blood pressure until it has caused damage to the body. Effective treatment can often prevent, delay or reduce that damage. TREATMENT Treatment for high blood pressure, when a cause has been identified, is directed at the cause. There are a large number of medications to treat HTN. These fall into several categories, and your caregiver will help you select the medicines that are best for you. Medications may have side effects. You should review side effects with your caregiver. If your blood pressure stays high after you have made lifestyle changes or started on medicines,   Your medication(s) may need to be changed.   Other problems may need to be addressed.   Be certain you understand your prescriptions, and know how and when to take your medicine.   Be sure to follow up with your caregiver within the time frame advised (usually within two weeks) to have your blood pressure rechecked and to review your medications.   If you are taking more than one medicine to lower your blood pressure, make sure you know how and at what times they should be taken. Taking two medicines at the same time can result in blood pressure that is too low.  SEEK IMMEDIATE MEDICAL CARE IF YOU DEVELOP:  A severe headache, blurred or changing vision, or confusion.   Unusual weakness or numbness, or a faint feeling.   Severe chest or abdominal pain, vomiting, or breathing problems.  MAKE SURE YOU:   Understand these instructions.   Will watch your condition.   Will get help right away if you are not doing well or get worse.  Document Released: 11/06/2005 Document Re-Released: 04/26/2010 [employer] Patient Information 2011 [employer]. 

## 2011-07-17 NOTE — Progress Notes (Signed)
Subjective:    Patient ID: Thomas Chung, male    DOB: Aug 18, 1941, 70 y.o.   MRN: [id number]  Hypertension This is a chronic problem. The current episode started more than 1 year ago. The problem has been gradually improving since onset. The problem is controlled. Pertinent negatives include no anxiety, blurred vision, chest pain, headaches, malaise/fatigue, neck pain, orthopnea, palpitations, peripheral edema, PND, shortness of breath or sweats. There are no associated agents to hypertension. Past treatments include angiotensin blockers, diuretics and beta blockers. The current treatment provides significant improvement. There are no compliance problems.   Diabetes He presents for his follow-up diabetic visit. He has type 2 diabetes mellitus. His disease course has been fluctuating. There are no hypoglycemic associated symptoms. Pertinent negatives for hypoglycemia include no dizziness, headaches, pallor, seizures, speech difficulty, sweats or tremors. Pertinent negatives for diabetes include no blurred vision, no chest pain, no fatigue, no foot paresthesias, no foot ulcerations, no polydipsia, no polyphagia, no polyuria, no visual change, no weakness and no weight loss. There are no hypoglycemic complications. Symptoms are stable. There are no diabetic complications. Current diabetic treatment includes diet. He is compliant with treatment all of the time. His weight is stable. He is following a generally healthy diet. Meal planning includes avoidance of concentrated sweets. He participates in exercise every other day. There is no change in his home blood glucose trend. His breakfast blood glucose range is generally 70-90 mg/dl. His lunch blood glucose range is generally 90-110 mg/dl. His dinner blood glucose range is generally 90-110 mg/dl. His highest blood glucose is 90-110 mg/dl. His overall blood glucose range is 90-110 mg/dl. An ACE inhibitor/angiotensin II receptor blocker is being taken. He does  not see a podiatrist.Eye exam is current.      Review of Systems  Constitutional: Negative for fever, chills, weight loss, malaise/fatigue, diaphoresis, activity change, appetite change, fatigue and unexpected weight change.  HENT: Negative for sore throat, facial swelling, trouble swallowing, neck pain and voice change.   Eyes: Negative.  Negative for blurred vision.  Respiratory: Negative for apnea, cough, choking, chest tightness, shortness of breath, wheezing and stridor.   Cardiovascular: Negative for chest pain, palpitations, orthopnea, leg swelling and PND.  Gastrointestinal: Negative for nausea, vomiting, abdominal pain, diarrhea, constipation and abdominal distention.  Genitourinary: Negative for dysuria, urgency, polyuria, frequency, hematuria, decreased urine volume, enuresis and difficulty urinating.  Musculoskeletal: Negative for myalgias, back pain, joint swelling, arthralgias and gait problem.  Skin: Negative for color change, pallor, rash and wound.  Neurological: Negative for dizziness, tremors, seizures, syncope, facial asymmetry, speech difficulty, weakness, light-headedness, numbness and headaches.  Hematological: Negative for polydipsia, polyphagia and adenopathy. Does not bruise/bleed easily.  Psychiatric/Behavioral: Negative.        Objective:   Physical Exam  Vitals reviewed. Constitutional: He is oriented to person, place, and time. He appears well-developed and well-nourished. No distress.  HENT:  Head: Atraumatic.  Mouth/Throat: No oropharyngeal exudate.  Eyes: Conjunctivae are normal. Right eye exhibits no discharge. Left eye exhibits no discharge. No scleral icterus.  Neck: Normal range of motion. Neck supple. No JVD present. No tracheal deviation present. No thyromegaly present.  Cardiovascular: Normal rate, regular rhythm, normal heart sounds and intact distal pulses.  Exam reveals no gallop and no friction rub.   No murmur heard. Pulmonary/Chest: Effort  normal and breath sounds normal. No stridor. No respiratory distress. He has no wheezes. He has no rales. He exhibits no tenderness.  Abdominal: Soft. Bowel sounds are normal. He exhibits  no distension and no mass. There is no tenderness. There is no rebound and no guarding. Hernia confirmed negative in the right inguinal area and confirmed negative in the left inguinal area.  Genitourinary: Rectum normal, prostate normal, testes normal and penis normal. Rectal exam shows no external hemorrhoid, no internal hemorrhoid, no fissure, no mass, no tenderness and anal tone normal. Guaiac negative stool. Prostate is not enlarged and not tender. Right testis shows no mass, no swelling and no tenderness. Right testis is descended. Cremasteric reflex is not absent on the right side. Left testis shows no mass, no swelling and no tenderness. Left testis is descended. Cremasteric reflex is not absent on the left side. Circumcised. No penile tenderness. No discharge found.  Musculoskeletal: Normal range of motion. He exhibits no edema and no tenderness.  Lymphadenopathy:    He has no cervical adenopathy.       Right: No inguinal adenopathy present.       Left: No inguinal adenopathy present.  Neurological: He is alert and oriented to person, place, and time. He has normal reflexes. He displays normal reflexes. No cranial nerve deficit. He exhibits normal muscle tone. Coordination normal.  Skin: Skin is warm and dry. No rash noted. He is not diaphoretic. No erythema. No pallor.  Psychiatric: He has a normal mood and affect. His behavior is normal. Judgment and thought content normal.      Lab Results  Component Value Date   WBC 7.5 04/21/2011   HGB 13.4 04/21/2011   HCT 39.2 04/21/2011   PLT 208.0 04/21/2011   CHOL 101 12/22/2010   TRIG 58.0 12/22/2010   HDL 33.90* 12/22/2010   ALT 21 04/21/2011   AST 18 04/21/2011   NA 139 04/21/2011   K 3.6 04/21/2011   CL 106 04/21/2011   CREATININE 1.0 04/21/2011   BUN 19 04/21/2011   CO2  27 04/21/2011   TSH 0.86 04/21/2011   PSA 0.11 04/29/2010   HGBA1C 5.9 04/21/2011      Assessment & Plan:

## 2011-07-17 NOTE — Assessment & Plan Note (Signed)
He is doing well on lipitor 

## 2011-07-17 NOTE — Assessment & Plan Note (Signed)
Based on s/s he is doing well, I will check his PSA today

## 2011-07-17 NOTE — Assessment & Plan Note (Signed)
I will check his A1C today 

## 2011-07-18 ENCOUNTER — Ambulatory Visit (AMBULATORY_SURGERY_CENTER): Payer: Medicare Other | Admitting: *Deleted

## 2011-07-18 VITALS — Ht 76.0 in | Wt 209.4 lb

## 2011-07-18 DIAGNOSIS — Z1211 Encounter for screening for malignant neoplasm of colon: Secondary | ICD-10-CM

## 2011-07-18 MED ORDER — SUPREP BOWEL PREP KIT 17.5-3.13-1.6 GM/177ML PO SOLN: 1.0000 | Freq: Once | ORAL | Status: DC

## 2011-07-19 LAB — HM DIABETES EYE EXAM: HM Diabetic Eye Exam: NORMAL

## 2011-07-28 ENCOUNTER — Ambulatory Visit (AMBULATORY_SURGERY_CENTER): Payer: Medicare Other | Admitting: Internal Medicine

## 2011-07-28 ENCOUNTER — Encounter: Payer: Self-pay | Admitting: Internal Medicine

## 2011-07-28 VITALS — BP 123/60 | HR 55 | Temp 96.6°F | Resp 12 | Ht 76.0 in | Wt 209.0 lb

## 2011-07-28 DIAGNOSIS — Z1211 Encounter for screening for malignant neoplasm of colon: Secondary | ICD-10-CM

## 2011-07-28 DIAGNOSIS — D126 Benign neoplasm of colon, unspecified: Secondary | ICD-10-CM

## 2011-07-28 DIAGNOSIS — D128 Benign neoplasm of rectum: Secondary | ICD-10-CM

## 2011-07-28 DIAGNOSIS — K648 Other hemorrhoids: Secondary | ICD-10-CM

## 2011-07-28 DIAGNOSIS — D129 Benign neoplasm of anus and anal canal: Secondary | ICD-10-CM

## 2011-07-28 DIAGNOSIS — Z86010 Personal history of colonic polyps: Secondary | ICD-10-CM

## 2011-07-28 LAB — GLUCOSE, CAPILLARY
Glucose-Capillary: 75 mg/dL (ref 70–99)
Glucose-Capillary: 100 mg/dL — ABNORMAL HIGH (ref 70–99)
Glucose-Capillary: 75 mg/dL (ref 70–99)

## 2011-07-28 MED ORDER — SODIUM CHLORIDE 0.9 % IV SOLN: 500.0000 mL | INTRAVENOUS | Status: DC

## 2011-07-28 NOTE — Patient Instructions (Addendum)
One small rectal polyp was removed and small internal hemorrhoids were seen. I anticipate a repeat colonoscopy if your health remains strong in about 5 years. After I see the pathology report I will send you a letter about this. Iva Boop, MD, [facility] Discharge instructions given with verbal understanding. Handouts on polyps,hemorroids and a high fiber diet given. Resume previous medications.

## 2011-07-28 NOTE — Progress Notes (Signed)
PTS INITIAL BLOOD SUGAR 75. PT STATES HIS NORMAL IS 70-90. PER PROTOCOL HUNG D5W AT [facility] AND WILL CONTINUE TO MONITOR HIS BLOOD SUGAR LEVELS. EWM  0940-CHECKED BLOOD SUGAR AT 0936 AND WAS 75 AGAIN. INFORMED DR Leone Payor OF RESULTS, D5W HANGING AND THAT PT STATES HE NORMALLY RUNS 70-90, USUALLY 70-75 IN THE AMS AND DR GESSNER INSTRUCTED TO LEAVE D5W HANGING BUT NO OTHER ORDERS TO BE DONE AT THIS TIME. PT CONTINUES TO BE ASYPTOMATIC. EWM

## 2011-07-31 ENCOUNTER — Telehealth: Payer: Self-pay | Admitting: *Deleted

## 2011-07-31 NOTE — Telephone Encounter (Signed)

## 2011-08-03 ENCOUNTER — Encounter: Payer: Self-pay | Admitting: Internal Medicine

## 2011-08-29 ENCOUNTER — Ambulatory Visit (INDEPENDENT_AMBULATORY_CARE_PROVIDER_SITE_OTHER): Payer: Medicare Other

## 2011-08-29 DIAGNOSIS — Z23 Encounter for immunization: Secondary | ICD-10-CM

## 2011-11-27 ENCOUNTER — Ambulatory Visit: Payer: Medicare Other | Admitting: Internal Medicine

## 2011-11-27 ENCOUNTER — Ambulatory Visit (INDEPENDENT_AMBULATORY_CARE_PROVIDER_SITE_OTHER): Payer: Medicare Other | Admitting: Internal Medicine

## 2011-11-27 ENCOUNTER — Encounter: Payer: Self-pay | Admitting: Internal Medicine

## 2011-11-27 DIAGNOSIS — I10 Essential (primary) hypertension: Secondary | ICD-10-CM

## 2011-11-27 DIAGNOSIS — E119 Type 2 diabetes mellitus without complications: Secondary | ICD-10-CM

## 2011-11-27 DIAGNOSIS — E785 Hyperlipidemia, unspecified: Secondary | ICD-10-CM

## 2011-11-27 DIAGNOSIS — B351 Tinea unguium: Secondary | ICD-10-CM

## 2011-11-27 NOTE — Assessment & Plan Note (Signed)
I will check his a1c today to see how well controlled his blood sugar is

## 2011-11-27 NOTE — Assessment & Plan Note (Signed)
Podiatry referral

## 2011-11-27 NOTE — Progress Notes (Signed)
Subjective:    Patient ID: Thomas Chung, male    DOB: 03-01-1941, 71 y.o.   MRN: [id number]  Diabetes He presents for his follow-up diabetic visit. He has type 2 diabetes mellitus. His disease course has been stable. There are no hypoglycemic associated symptoms. Pertinent negatives for hypoglycemia include no dizziness, headaches, pallor, seizures, speech difficulty or tremors. Pertinent negatives for diabetes include no blurred vision, no chest pain, no fatigue, no foot paresthesias, no foot ulcerations, no polydipsia, no polyphagia, no polyuria, no visual change, no weakness and no weight loss. There are no hypoglycemic complications. Symptoms are stable. There are no diabetic complications. Current diabetic treatment includes diet. He is compliant with treatment all of the time. His weight is stable. Meal planning includes avoidance of concentrated sweets. He has not had a previous visit with a dietician. He participates in exercise every other day. There is no change in his home blood glucose trend. His breakfast blood glucose range is generally 90-110 mg/dl. His lunch blood glucose range is generally 90-110 mg/dl. His dinner blood glucose range is generally 90-110 mg/dl. His highest blood glucose is 90-110 mg/dl. His overall blood glucose range is 90-110 mg/dl. An ACE inhibitor/angiotensin II receptor blocker is being taken. He does not see a podiatrist.Eye exam is current.      Review of Systems  Constitutional: Negative for fever, chills, weight loss, diaphoresis, activity change, appetite change, fatigue and unexpected weight change.  HENT: Negative.   Eyes: Negative.  Negative for blurred vision.  Respiratory: Negative for cough, chest tightness, shortness of breath, wheezing and stridor.   Cardiovascular: Negative for chest pain, palpitations and leg swelling.  Gastrointestinal: Negative for nausea, vomiting, abdominal pain, diarrhea and constipation.  Genitourinary: Negative for  dysuria, urgency, polyuria, frequency, hematuria, flank pain, decreased urine volume, enuresis and difficulty urinating.  Musculoskeletal: Negative for myalgias, back pain, joint swelling, arthralgias and gait problem.  Skin: Negative for color change, pallor, rash and wound.  Neurological: Negative for dizziness, tremors, seizures, syncope, facial asymmetry, speech difficulty, weakness, light-headedness, numbness and headaches.  Hematological: Negative for polydipsia, polyphagia and adenopathy. Does not bruise/bleed easily.  Psychiatric/Behavioral: Negative.        Objective:   Physical Exam  Vitals reviewed. Constitutional: He is oriented to person, place, and time. He appears well-developed and well-nourished. No distress.  HENT:  Head: Normocephalic and atraumatic.  Mouth/Throat: Oropharynx is clear and moist. No oropharyngeal exudate.  Eyes: Conjunctivae are normal. Right eye exhibits no discharge. Left eye exhibits no discharge. No scleral icterus.  Neck: Normal range of motion. Neck supple. No JVD present. No tracheal deviation present. No thyromegaly present.  Cardiovascular: Normal rate, regular rhythm, normal heart sounds and intact distal pulses.  Exam reveals no gallop and no friction rub.   No murmur heard. Pulses:      Carotid pulses are 1+ on the right side, and 1+ on the left side.      Radial pulses are 1+ on the right side, and 1+ on the left side.       Femoral pulses are 1+ on the right side, and 1+ on the left side.      Popliteal pulses are 1+ on the right side, and 1+ on the left side.       Dorsalis pedis pulses are 1+ on the right side, and 1+ on the left side.       Posterior tibial pulses are 1+ on the right side, and 1+ on the left side.  Pulmonary/Chest: Effort normal and breath sounds normal. No stridor. No respiratory distress. He has no wheezes. He has no rales. He exhibits no tenderness.  Abdominal: Soft. Bowel sounds are normal. He exhibits no distension  and no mass. There is no tenderness. There is no rebound and no guarding.  Musculoskeletal: Normal range of motion. He exhibits no edema and no tenderness.  Lymphadenopathy:    He has no cervical adenopathy.  Neurological: He is oriented to person, place, and time.  Skin: Skin is warm, dry and intact. No abrasion, no bruising, no burn, no ecchymosis, no laceration, no lesion and no rash noted. Rash is not maculopapular. He is not diaphoretic. No erythema. No pallor.       He has toenail thickening with lysis and subungual debris  Psychiatric: He has a normal mood and affect. His behavior is normal. Judgment and thought content normal.      Lab Results  Component Value Date   WBC 7.5 04/21/2011   HGB 13.4 04/21/2011   HCT 39.2 04/21/2011   PLT 208.0 04/21/2011   GLUCOSE 86 07/17/2011   CHOL 101 12/22/2010   TRIG 58.0 12/22/2010   HDL 33.90* 12/22/2010   LDLCALC 56 12/22/2010   ALT 25 07/17/2011   AST 20 07/17/2011   NA 140 07/17/2011   K 4.1 07/17/2011   CL 103 07/17/2011   CREATININE 1.0 07/17/2011   BUN 26* 07/17/2011   CO2 30 07/17/2011   TSH 0.86 04/21/2011   PSA 0.10 07/17/2011   HGBA1C 5.9 07/17/2011      Assessment & Plan:

## 2011-11-27 NOTE — Patient Instructions (Signed)
Diabetes, Type 2 Diabetes is a long-lasting (chronic) disease. In type 2 diabetes, the pancreas does not make enough insulin (a hormone), and the body does not respond normally to the insulin that is made. This type of diabetes was also previously called adult-onset diabetes. It usually occurs after the age of 40, but it can occur at any age.  CAUSES  Type 2 diabetes happens because the pancreasis not making enough insulin or your body has trouble using the insulin that your pancreas does make properly. SYMPTOMS   Drinking more than usual.   Urinating more than usual.   Blurred vision.   Dry, itchy skin.   Frequent infections.   Feeling more tired than usual (fatigue).  DIAGNOSIS The diagnosis of type 2 diabetes is usually made by one of the following tests:  Fasting blood glucose test. You will not eat for at least 8 hours and then take a blood test.   Random blood glucose test. Your blood glucose (sugar) is checked at any time of the day regardless of when you ate.   Oral glucose tolerance test (OGTT). Your blood glucose is measured after you have not eaten (fasted) and then after you drink a glucose containing beverage.  TREATMENT   Healthy eating.   Exercise.   Medicine, if needed.   Monitoring blood glucose.   Seeing your caregiver regularly.  HOME CARE INSTRUCTIONS   Check your blood glucose at least once a day. More frequent monitoring may be necessary, depending on your medicines and on how well your diabetes is controlled. Your caregiver will advise you.   Take your medicine as directed by your caregiver.   Do not smoke.   Make wise food choices. Ask your caregiver for information. Weight loss can improve your diabetes.   Learn about low blood glucose (hypoglycemia) and how to treat it.   Get your eyes checked regularly.   Have a yearly physical exam. Have your blood pressure checked and your blood and urine tested.   Wear a pendant or bracelet saying  that you have diabetes.   Check your feet every night for cuts, sores, blisters, and redness. Let your caregiver know if you have any problems.  SEEK MEDICAL CARE IF:   You have problems keeping your blood glucose in target range.   You have problems with your medicines.   You have symptoms of an illness that do not improve after 24 hours.   You have a sore or wound that is not healing.   You notice a change in vision or a new problem with your vision.   You have a fever.  MAKE SURE YOU:  Understand these instructions.   Will watch your condition.   Will get help right away if you are not doing well or get worse.  Document Released: 11/06/2005 Document Revised: 07/20/2011 Document Reviewed: 04/24/2011 [employer] Patient Information 2012 [employer].A1c, Hemoglobin A1c The A1c (hemoglobin A1c, glycosylated hemoglobin) test checks the average amount of sugar (glucose) in the blood over the last 2 to 3 months. It does this by measuring the concentration of glycosylated hemoglobin. As glucose circulates in the blood, some of it binds to hemoglobin A. This is the main form of hemoglobin in adults. Hemoglobin is a red protein that carries oxygen in the red blood cells (RBCs). Once the glucose is bound to the hemoglobin A, it remains there for the life of the red blood cell (about 120 days). This combination of glucose and hemoglobin A is called   A1c. Increased glucose in the blood, increases the hemoglobin A1c. A1c levels do not change quickly but will shift as RBCs are replaced. A1c is a valuable test because it enables you to know how your glucose has been controlled over the past 3 months.  5% A1c   Estimated Average Glucose mg/dL: 97   6% A1c   Estimated Average Glucose mg/dL: 126   7% A1c   Estimated Average Glucose mg/dL: 154   8% A1c   Estimated Average Glucose mg/dL: 183   9% A1c   Estimated Average Glucose mg/dL: 212   10% A1c   Estimated Average Glucose mg/dL:  240   11% A1c   Estimated Average Glucose mg/dL: 269   12% A1c   Estimated Average Glucose mg/dL: 298  The American Diabetes Association (ADA) recommends testing your A1c level 4 times each year if you have type 1 or type 2 diabetes and use insulin; or 2 times each year if you have type 2 diabetes and do not use insulin. When someone is first diagnosed with diabetes or if control is not good, A1c may be ordered more frequently. PREPARATION FOR TEST No preparation or fasting is necessary for this blood sample. NORMAL FINDINGS   Adults without diabetes: 2.2 to 4.8%   Children without diabetes: 1.8 to 4.0%   Good diabetic control: 2.5 to 5.9%   Fair diabetic control: 6 to 8%   Poor diabetic control: greater than 8%  The values of A1c may be falsely low in pregnancy, in disorders with shortened red blood cell lives, or in sickle cell disease or trait (carrier). The values may be falsely high in disorders with longer red cell lives, or in people with Thallassemia, kidney failure, or iron deficiency anemia. Ranges for normal findings may vary among different laboratories and hospitals. You should always check with your doctor after having lab work or other tests done to discuss the meaning of your test results and whether your values are considered within normal limits. MEANING OF TEST  Your caregiver will go over the test results with you and discuss the importance and meaning of your results, as well as treatment options and the need for additional tests if necessary. If your A1c is greater than 7%, discuss treatment options with your caregiver. OBTAINING THE TEST RESULTS  It is your responsibility to obtain your test results. Ask the lab or department performing the test when and how you will get your results. Document Released: 11/28/2004 Document Revised: 07/19/2011 Document Reviewed: 10/10/2008 Exit[employer] Patient Information 2012 [employer]. 

## 2011-11-27 NOTE — Assessment & Plan Note (Signed)
He is doing well on lipitor, will check his FLP today to monitor efficacy and CMP to look for toxicity

## 2011-11-27 NOTE — Assessment & Plan Note (Signed)
His BP is well controlled, I need to monitor his lytes today

## 2011-11-28 ENCOUNTER — Other Ambulatory Visit (INDEPENDENT_AMBULATORY_CARE_PROVIDER_SITE_OTHER): Payer: Medicare Other

## 2011-11-28 DIAGNOSIS — E119 Type 2 diabetes mellitus without complications: Secondary | ICD-10-CM

## 2011-11-28 DIAGNOSIS — I10 Essential (primary) hypertension: Secondary | ICD-10-CM

## 2011-11-28 DIAGNOSIS — E785 Hyperlipidemia, unspecified: Secondary | ICD-10-CM

## 2011-11-28 LAB — CBC WITH DIFFERENTIAL/PLATELET
Basophils Absolute: 0 10*3/uL (ref 0.0–0.1)
Basophils Relative: 0.4 % (ref 0.0–3.0)
Eosinophils Absolute: 0.2 10*3/uL (ref 0.0–0.7)
Eosinophils Relative: 2.7 % (ref 0.0–5.0)
HCT: 43.8 % (ref 39.0–52.0)
Hemoglobin: 15.1 g/dL (ref 13.0–17.0)
Lymphocytes Relative: 16.2 % (ref 12.0–46.0)
Lymphs Abs: 1.1 10*3/uL (ref 0.7–4.0)
MCHC: 34.6 g/dL (ref 30.0–36.0)
MCV: 91.8 fl (ref 78.0–100.0)
Monocytes Absolute: 0.7 10*3/uL (ref 0.1–1.0)
Monocytes Relative: 10.1 % (ref 3.0–12.0)
Neutro Abs: 4.6 10*3/uL (ref 1.4–7.7)
Neutrophils Relative %: 70.6 % (ref 43.0–77.0)
Platelets: 198 10*3/uL (ref 150.0–400.0)
RBC: 4.77 Mil/uL (ref 4.22–5.81)
RDW: 13.4 % (ref 11.5–14.6)
WBC: 6.5 10*3/uL (ref 4.5–10.5)

## 2011-11-28 LAB — LIPID PANEL
Cholesterol: 148 mg/dL (ref 0–200)
HDL: 58.6 mg/dL (ref >39.00)
LDL Cholesterol: 79 mg/dL (ref 0–99)
Total CHOL/HDL Ratio: 3
Triglycerides: 51 mg/dL (ref 0.0–149.0)
VLDL: 10.2 mg/dL (ref 0.0–40.0)

## 2011-11-28 LAB — COMPREHENSIVE METABOLIC PANEL
ALT: 22 U/L (ref 0–53)
AST: 20 U/L (ref 0–37)
Albumin: 4.3 g/dL (ref 3.5–5.2)
Alkaline Phosphatase: 50 U/L (ref 39–117)
BUN: 21 mg/dL (ref 6–23)
CO2: 32 mEq/L (ref 19–32)
Calcium: 9.4 mg/dL (ref 8.4–10.5)
Chloride: 105 mEq/L (ref 96–112)
Creatinine, Ser: 1 mg/dL (ref 0.4–1.5)
GFR: 82.31 mL/min (ref >60.00)
Glucose, Bld: 80 mg/dL (ref 70–99)
Potassium: 4.4 mEq/L (ref 3.5–5.1)
Sodium: 142 mEq/L (ref 135–145)
Total Bilirubin: 0.9 mg/dL (ref 0.3–1.2)
Total Protein: 7 g/dL (ref 6.0–8.3)

## 2011-11-28 LAB — HEMOGLOBIN A1C: Hgb A1c MFr Bld: 5.5 % (ref 4.6–6.5)

## 2011-11-29 ENCOUNTER — Encounter: Payer: Self-pay | Admitting: Internal Medicine

## 2011-12-23 ENCOUNTER — Other Ambulatory Visit: Payer: Self-pay | Admitting: Internal Medicine

## 2012-01-30 ENCOUNTER — Other Ambulatory Visit: Payer: Self-pay | Admitting: Internal Medicine

## 2012-02-02 DIAGNOSIS — L84 Corns and callosities: Secondary | ICD-10-CM | POA: Diagnosis not present

## 2012-02-02 DIAGNOSIS — M898X9 Other specified disorders of bone, unspecified site: Secondary | ICD-10-CM | POA: Diagnosis not present

## 2012-02-02 DIAGNOSIS — E119 Type 2 diabetes mellitus without complications: Secondary | ICD-10-CM | POA: Diagnosis not present

## 2012-02-02 DIAGNOSIS — L608 Other nail disorders: Secondary | ICD-10-CM | POA: Diagnosis not present

## 2012-02-03 DIAGNOSIS — S62609A Fracture of unspecified phalanx of unspecified finger, initial encounter for closed fracture: Secondary | ICD-10-CM | POA: Diagnosis not present

## 2012-02-05 DIAGNOSIS — S63279A Dislocation of unspecified interphalangeal joint of unspecified finger, initial encounter: Secondary | ICD-10-CM | POA: Diagnosis not present

## 2012-02-05 DIAGNOSIS — S62639A Displaced fracture of distal phalanx of unspecified finger, initial encounter for closed fracture: Secondary | ICD-10-CM | POA: Diagnosis not present

## 2012-02-06 ENCOUNTER — Other Ambulatory Visit: Payer: Self-pay | Admitting: Orthopedic Surgery

## 2012-02-07 ENCOUNTER — Encounter (HOSPITAL_BASED_OUTPATIENT_CLINIC_OR_DEPARTMENT_OTHER): Payer: Self-pay | Admitting: *Deleted

## 2012-02-07 ENCOUNTER — Other Ambulatory Visit: Payer: Self-pay

## 2012-02-07 ENCOUNTER — Encounter (HOSPITAL_BASED_OUTPATIENT_CLINIC_OR_DEPARTMENT_OTHER)
Admission: RE | Admit: 2012-02-07 | Discharge: 2012-02-07 | Disposition: A | Discharge status: Home / Self Care | Payer: Medicare Other | Source: Ambulatory Visit | Attending: Orthopedic Surgery | Admitting: Orthopedic Surgery

## 2012-02-07 DIAGNOSIS — I10 Essential (primary) hypertension: Secondary | ICD-10-CM | POA: Diagnosis not present

## 2012-02-07 DIAGNOSIS — IMO0002 Reserved for concepts with insufficient information to code with codable children: Secondary | ICD-10-CM | POA: Diagnosis not present

## 2012-02-07 DIAGNOSIS — K7689 Other specified diseases of liver: Secondary | ICD-10-CM | POA: Diagnosis not present

## 2012-02-07 DIAGNOSIS — Z01812 Encounter for preprocedural laboratory examination: Secondary | ICD-10-CM | POA: Diagnosis not present

## 2012-02-07 DIAGNOSIS — F329 Major depressive disorder, single episode, unspecified: Secondary | ICD-10-CM | POA: Diagnosis not present

## 2012-02-07 DIAGNOSIS — E785 Hyperlipidemia, unspecified: Secondary | ICD-10-CM | POA: Diagnosis not present

## 2012-02-07 LAB — BASIC METABOLIC PANEL
BUN: 29 mg/dL — ABNORMAL HIGH (ref 6–23)
CO2: 28 mEq/L (ref 19–32)
Calcium: 10.1 mg/dL (ref 8.4–10.5)
Chloride: 103 mEq/L (ref 96–112)
Creatinine, Ser: 0.99 mg/dL (ref 0.50–1.35)
GFR calc Af Amer: >90 mL/min (ref >90)
GFR calc non Af Amer: 81 mL/min — ABNORMAL LOW (ref >90)
Glucose, Bld: 89 mg/dL (ref 70–99)
Potassium: 4.5 mEq/L (ref 3.5–5.1)
Sodium: 140 mEq/L (ref 135–145)

## 2012-02-07 NOTE — Progress Notes (Signed)
Coming in for bmet=[id number]-

## 2012-02-08 ENCOUNTER — Encounter (HOSPITAL_BASED_OUTPATIENT_CLINIC_OR_DEPARTMENT_OTHER): Payer: Self-pay

## 2012-02-08 ENCOUNTER — Encounter (HOSPITAL_BASED_OUTPATIENT_CLINIC_OR_DEPARTMENT_OTHER): Payer: Self-pay | Admitting: Anesthesiology

## 2012-02-08 ENCOUNTER — Ambulatory Visit (HOSPITAL_BASED_OUTPATIENT_CLINIC_OR_DEPARTMENT_OTHER)
Admission: RE | Admit: 2012-02-08 | Discharge: 2012-02-08 | Disposition: A | Discharge status: Home / Self Care | Payer: Medicare Other | Source: Ambulatory Visit | Attending: Orthopedic Surgery | Admitting: Orthopedic Surgery

## 2012-02-08 ENCOUNTER — Encounter (HOSPITAL_BASED_OUTPATIENT_CLINIC_OR_DEPARTMENT_OTHER)
Admission: RE | Disposition: A | Discharge status: Home / Self Care | Payer: Self-pay | Source: Ambulatory Visit | Attending: Orthopedic Surgery

## 2012-02-08 ENCOUNTER — Ambulatory Visit (HOSPITAL_BASED_OUTPATIENT_CLINIC_OR_DEPARTMENT_OTHER): Payer: Medicare Other | Admitting: Anesthesiology

## 2012-02-08 DIAGNOSIS — F3289 Other specified depressive episodes: Secondary | ICD-10-CM | POA: Insufficient documentation

## 2012-02-08 DIAGNOSIS — S62309A Unspecified fracture of unspecified metacarpal bone, initial encounter for closed fracture: Secondary | ICD-10-CM | POA: Diagnosis not present

## 2012-02-08 DIAGNOSIS — K7689 Other specified diseases of liver: Secondary | ICD-10-CM | POA: Insufficient documentation

## 2012-02-08 DIAGNOSIS — I10 Essential (primary) hypertension: Secondary | ICD-10-CM | POA: Insufficient documentation

## 2012-02-08 DIAGNOSIS — Z01812 Encounter for preprocedural laboratory examination: Secondary | ICD-10-CM | POA: Insufficient documentation

## 2012-02-08 DIAGNOSIS — E785 Hyperlipidemia, unspecified: Secondary | ICD-10-CM | POA: Diagnosis not present

## 2012-02-08 DIAGNOSIS — S62639A Displaced fracture of distal phalanx of unspecified finger, initial encounter for closed fracture: Secondary | ICD-10-CM | POA: Diagnosis not present

## 2012-02-08 DIAGNOSIS — IMO0002 Reserved for concepts with insufficient information to code with codable children: Secondary | ICD-10-CM | POA: Insufficient documentation

## 2012-02-08 DIAGNOSIS — S63279A Dislocation of unspecified interphalangeal joint of unspecified finger, initial encounter: Secondary | ICD-10-CM | POA: Diagnosis not present

## 2012-02-08 DIAGNOSIS — F329 Major depressive disorder, single episode, unspecified: Secondary | ICD-10-CM | POA: Diagnosis not present

## 2012-02-08 DIAGNOSIS — Y998 Other external cause status: Secondary | ICD-10-CM | POA: Insufficient documentation

## 2012-02-08 HISTORY — PX: ORIF FINGER FRACTURE: SHX2122

## 2012-02-08 HISTORY — DX: Myoneural disorder, unspecified: G70.9

## 2012-02-08 SURGERY — OPEN REDUCTION INTERNAL FIXATION (ORIF) METACARPAL (FINGER) FRACTURE
Anesthesia: General | Site: Finger | Laterality: Right | Wound class: Clean

## 2012-02-08 MED ORDER — CEFAZOLIN SODIUM-DEXTROSE 2-3 GM-% IV SOLR
2.0000 g | Freq: Once | INTRAVENOUS | Status: AC
  Administered 2012-02-08: 2 g via INTRAVENOUS

## 2012-02-08 MED ORDER — CHLORHEXIDINE GLUCONATE 4 % EX LIQD: 60.0000 mL | Freq: Once | CUTANEOUS | Status: DC

## 2012-02-08 MED ORDER — PROPOFOL 10 MG/ML IV EMUL
INTRAVENOUS | Status: DC | PRN
  Administered 2012-02-08: 200 mg via INTRAVENOUS

## 2012-02-08 MED ORDER — HYDROMORPHONE HCL PF 1 MG/ML IJ SOLN: 0.2500 mg | INTRAMUSCULAR | Status: DC | PRN

## 2012-02-08 MED ORDER — OXYCODONE-ACETAMINOPHEN 5-325 MG PO TABS
1.0000 | ORAL_TABLET | ORAL | Status: DC | PRN
  Administered 2012-02-08: 1 via ORAL

## 2012-02-08 MED ORDER — OXYCODONE-ACETAMINOPHEN 5-325 MG PO TABS: ORAL_TABLET | ORAL | Status: DC

## 2012-02-08 MED ORDER — LACTATED RINGERS IV SOLN
INTRAVENOUS | Status: DC
  Administered 2012-02-08 (×3): via INTRAVENOUS

## 2012-02-08 MED ORDER — ONDANSETRON HCL 4 MG/2ML IJ SOLN
INTRAMUSCULAR | Status: DC | PRN
  Administered 2012-02-08: 4 mg via INTRAVENOUS

## 2012-02-08 MED ORDER — FENTANYL CITRATE 0.05 MG/ML IJ SOLN
INTRAMUSCULAR | Status: DC | PRN
  Administered 2012-02-08: 50 ug via INTRAVENOUS

## 2012-02-08 MED ORDER — LIDOCAINE HCL (PF) 2 % IJ SOLN
INTRAMUSCULAR | Status: DC | PRN
  Administered 2012-02-08: 5 mL

## 2012-02-08 MED ORDER — GLYCOPYRROLATE 0.2 MG/ML IJ SOLN
INTRAMUSCULAR | Status: DC | PRN
  Administered 2012-02-08: 0.2 mg via INTRAVENOUS

## 2012-02-08 MED ORDER — LIDOCAINE HCL (CARDIAC) 20 MG/ML IV SOLN
INTRAVENOUS | Status: DC | PRN
  Administered 2012-02-08: 100 mg via INTRAVENOUS

## 2012-02-08 MED ORDER — CEPHALEXIN 500 MG PO CAPS: 500.0000 mg | ORAL_CAPSULE | Freq: Three times a day (TID) | ORAL | Status: AC

## 2012-02-08 MED ORDER — MORPHINE SULFATE 4 MG/ML IJ SOLN: 0.0500 mg/kg | INTRAMUSCULAR | Status: DC | PRN

## 2012-02-08 MED ORDER — ONDANSETRON HCL 4 MG/2ML IJ SOLN: 4.0000 mg | Freq: Once | INTRAMUSCULAR | Status: DC | PRN

## 2012-02-08 SURGICAL SUPPLY — 66 items
BANDAGE ADHESIVE 1X3 (GAUZE/BANDAGES/DRESSINGS) IMPLANT
BANDAGE ELASTIC 3 VELCRO ST LF (GAUZE/BANDAGES/DRESSINGS) ×2 IMPLANT
BANDAGE GAUZE ELAST BULKY 4 IN (GAUZE/BANDAGES/DRESSINGS) IMPLANT
BIT DRILL 1.0 (BIT) ×2
BIT DRILL 1.0X50 (BIT) IMPLANT
BLADE MINI RND TIP GREEN BEAV (BLADE) ×1 IMPLANT
BLADE SURG 15 STRL LF DISP TIS (BLADE) ×1 IMPLANT
BLADE SURG 15 STRL SS (BLADE) ×2
BNDG CMPR 9X4 STRL LF SNTH (GAUZE/BANDAGES/DRESSINGS) ×1
BNDG COHESIVE 1X5 TAN STRL LF (GAUZE/BANDAGES/DRESSINGS) IMPLANT
BNDG ESMARK 4X9 LF (GAUZE/BANDAGES/DRESSINGS) ×1 IMPLANT
BRUSH SCRUB EZ PLAIN DRY (MISCELLANEOUS) ×2 IMPLANT
CANISTER SUCTION 1200CC (MISCELLANEOUS) IMPLANT
CLOTH BEACON ORANGE TIMEOUT ST (SAFETY) ×2 IMPLANT
CORDS BIPOLAR (ELECTRODE) ×2 IMPLANT
COVER MAYO STAND STRL (DRAPES) ×2 IMPLANT
COVER TABLE BACK 60X90 (DRAPES) ×2 IMPLANT
CUFF TOURNIQUET SINGLE 18IN (TOURNIQUET CUFF) ×1 IMPLANT
DECANTER SPIKE VIAL GLASS SM (MISCELLANEOUS) ×1 IMPLANT
DRAPE OEC MINIVIEW 54X84 (DRAPES) ×2 IMPLANT
DRAPE SURG 17X23 STRL (DRAPES) ×2 IMPLANT
GLOVE BIO SURGEON STRL SZ 6.5 (GLOVE) ×1 IMPLANT
GLOVE BIOGEL M STRL SZ7.5 (GLOVE) ×2 IMPLANT
GLOVE BIOGEL PI IND STRL 7.0 (GLOVE) IMPLANT
GLOVE BIOGEL PI INDICATOR 7.0 (GLOVE) ×1
GLOVE EXAM NITRILE PF MED BLUE (GLOVE) ×1 IMPLANT
GLOVE ORTHO TXT STRL SZ7.5 (GLOVE) ×2 IMPLANT
GOWN BRE IMP PREV XXLGXLNG (GOWN DISPOSABLE) ×1 IMPLANT
GOWN PREVENTION PLUS XLARGE (GOWN DISPOSABLE) ×2 IMPLANT
NEEDLE 27GAX1X1/2 (NEEDLE) ×1 IMPLANT
NS IRRIG 1000ML POUR BTL (IV SOLUTION) ×2 IMPLANT
PACK BASIN DAY SURGERY FS (CUSTOM PROCEDURE TRAY) ×2 IMPLANT
PAD CAST 3X4 CTTN HI CHSV (CAST SUPPLIES) ×1 IMPLANT
PAD CAST 4YDX4 CTTN HI CHSV (CAST SUPPLIES) IMPLANT
PADDING CAST ABS 4INX4YD NS (CAST SUPPLIES)
PADDING CAST ABS COTTON 4X4 ST (CAST SUPPLIES) ×1 IMPLANT
PADDING CAST COTTON 3X4 STRL (CAST SUPPLIES) ×2
PADDING CAST COTTON 4X4 STRL (CAST SUPPLIES)
PADDING UNDERCAST 2  STERILE (CAST SUPPLIES) ×1 IMPLANT
PLATE T 1.3 3H HEAD/8H SFT (Plate) IMPLANT
PLATE-T 1.3 3H HEAD/8H SFT (Plate) ×2 IMPLANT
SCREW SELF TAP CORTEX 1.3 10MM (Screw) ×1 IMPLANT
SCREW SELF TAP CORTEX 1.3 6MM (Screw) ×3 IMPLANT
SCREW SELF TAP CORTEX 1.3 7MM (Screw) ×1 IMPLANT
SCREW SELF TAP CORTEX 1.3 8MM (Screw) ×1 IMPLANT
SCREW SELF TAP CORTEX 1.3 9MM (Screw) ×1 IMPLANT
SLEEVE SCD COMPRESS KNEE MED (MISCELLANEOUS) ×1 IMPLANT
SPLINT PLASTER CAST XFAST 3X15 (CAST SUPPLIES) ×1 IMPLANT
SPLINT PLASTER XTRA FASTSET 3X (CAST SUPPLIES) ×14
SPONGE GAUZE 4X4 12PLY (GAUZE/BANDAGES/DRESSINGS) ×2 IMPLANT
STOCKINETTE 4X48 STRL (DRAPES) ×2 IMPLANT
SUCTION FRAZIER TIP 10 FR DISP (SUCTIONS) IMPLANT
SUT ETHILON 4 0 PS 2 18 (SUTURE) ×1 IMPLANT
SUT MERSILENE 4 0 P 3 (SUTURE) ×2 IMPLANT
SUT PROLENE 3 0 PS 2 (SUTURE) ×2 IMPLANT
SUT PROLENE 4 0 P 3 18 (SUTURE) ×2 IMPLANT
SUT VIC AB 4-0 P-3 18XBRD (SUTURE) ×1 IMPLANT
SUT VIC AB 4-0 P3 18 (SUTURE)
SYR 3ML 23GX1 SAFETY (SYRINGE) IMPLANT
SYR BULB 3OZ (MISCELLANEOUS) ×2 IMPLANT
SYR CONTROL 10ML LL (SYRINGE) ×1 IMPLANT
TOWEL OR 17X24 6PK STRL BLUE (TOWEL DISPOSABLE) ×2 IMPLANT
TRAY DSU PREP LF (CUSTOM PROCEDURE TRAY) ×2 IMPLANT
TUBE CONNECTING 20X1/4 (TUBING) IMPLANT
UNDERPAD 30X30 INCONTINENT (UNDERPADS AND DIAPERS) ×2 IMPLANT
WATER STERILE IRR 1000ML POUR (IV SOLUTION) IMPLANT

## 2012-02-08 NOTE — Discharge Instructions (Signed)
[facility] Instructions Hand Surgery  Wound Care: Keep your hand elevated above the level of your heart.  Do not allow it to dangle  by your side.  Keep the dressing dry and do not remove it unless your doctor advises you to do so.  He will usually change it at the time of your post-op visit.  Moving your fingers is advised to stimulate circulation but will depend on the site of your surgery.  If you have a splint applied, your doctor will advise you regarding movement.  Activity: Do not drive or operate machinery today.  Rest today and then you may return to your normal activity and work as indicated by your physician.  Diet:  Drink liquids today or eat a light diet.  You may resume a regular diet tomorrow.    General expectations: Pain for two to three days. Fingers may become slightly swollen.  Call your doctor if any of the following occur: Severe pain not relieved by pain medication. Elevated temperature. Dressing soaked with blood. Inability to move fingers. White or bluish color to fingers.[facility]  [address] [place], [place] [address] [phone]   Post Anesthesia Home Care Instructions  Activity: Get plenty of rest for the remainder of the day. A responsible adult should stay with you for 24 hours following the procedure.  For the next 24 hours, DO NOT: -Drive a car -Operate machinery -Drink alcoholic beverages -Take any medication unless instructed by your physician -Make any legal decisions or sign important papers.  Meals: Start with liquid foods such as gelatin or soup. Progress to regular foods as tolerated. Avoid greasy, spicy, heavy foods. If nausea and/or vomiting occur, drink only clear liquids until the nausea and/or vomiting subsides. Call your physician if vomiting continues.  Special Instructions/Symptoms: Your throat may feel dry or sore from the anesthesia or the breathing tube placed in your throat during surgery. If  this causes discomfort, gargle with warm salt water. The discomfort should disappear within 24 hours.   

## 2012-02-08 NOTE — Anesthesia Preprocedure Evaluation (Signed)
Anesthesia Evaluation  Patient identified by MRN, date of birth, ID band Patient awake    Reviewed: Allergy & Precautions, H&P , NPO status , Patient's Chart, lab work & pertinent test results  Airway Mallampati: I TM Distance: >3 FB Neck ROM: Full    Dental  (+) Teeth Intact and Dental Advisory Given   Pulmonary  breath sounds clear to auscultation        Cardiovascular Rhythm:Regular Rate:Normal     Neuro/Psych    GI/Hepatic   Endo/Other    Renal/GU      Musculoskeletal   Abdominal   Peds  Hematology   Anesthesia Other Findings   Reproductive/Obstetrics                           Anesthesia Physical Anesthesia Plan  ASA: III  Anesthesia Plan: General   Post-op Pain Management:    Induction: Intravenous  Airway Management Planned: LMA  Additional Equipment:   Intra-op Plan:   Post-operative Plan:   Informed Consent: I have reviewed the patients History and Physical, chart, labs and discussed the procedure including the risks, benefits and alternatives for the proposed anesthesia with the patient or authorized representative who has indicated his/her understanding and acceptance.   Dental advisory given  Plan Discussed with: CRNA, Anesthesiologist and Surgeon  Anesthesia Plan Comments:         Anesthesia Quick Evaluation

## 2012-02-08 NOTE — Brief Op Note (Signed)
02/08/2012  10:55 AM  PATIENT:  Mason Jim  71 y.o. male  PRE-OPERATIVE DIAGNOSIS:  displaced comminuted pilon articular  fracture right small finger middle phalanx at PIP joint  POST-OPERATIVE DIAGNOSIS:  displaced comminuted pilon articular  fracture right small middle phalanx at PIP joint finger  PROCEDURE:  Procedure(s) OPEN REDUCTION INTERNAL FIXATION RIGHT SMALL MIDDLE PHALANX  ARTICULAR PILON FRACTURE WITH 1.3 MM PLATE  SURGEON: Wyn Forster., MD   PHYSICIAN ASSISTANT:   ASSISTANTS: Mallory Shirk.A-C    ANESTHESIA:   general  EBL:  Total I/O In: 1000 [I.V.:1000] Out: -   BLOOD ADMINISTERED:none  DRAINS: none   LOCAL MEDICATIONS USED:  XYLOCAINE 5CC 2%  SPECIMEN:  No Specimen  DISPOSITION OF SPECIMEN:  N/A  COUNTS:  YES  TOURNIQUET:   Total Tourniquet Time Documented: Upper Arm (Right) - 52 minutes  DICTATION: .Other Dictation: Dictation Number [id number]  PLAN OF CARE: Discharge to home after PACU  PATIENT DISPOSITION:  PACU - hemodynamically stable.

## 2012-02-08 NOTE — Op Note (Signed)
OP NOTE DICTATED:  02/08/12 [id number]

## 2012-02-08 NOTE — Transfer of Care (Signed)
Immediate Anesthesia Transfer of Care Note  Patient: Thomas Chung  Procedure(s) Performed: Procedure(s) (LRB): OPEN REDUCTION INTERNAL FIXATION (ORIF) METACARPAL (FINGER) FRACTURE (Right)  Patient Location: PACU  Anesthesia Type: General  Level of Consciousness: awake and sedated  Airway & Oxygen Therapy: Patient Spontanous Breathing and Patient connected to face mask oxygen  Post-op Assessment: Report given to PACU RN and Post -op Vital signs reviewed and stable  Post vital signs: Reviewed and stable  Complications: No apparent anesthesia complications

## 2012-02-08 NOTE — Anesthesia Postprocedure Evaluation (Signed)
  Anesthesia Post-op Note  Patient: Thomas Chung  Procedure(s) Performed: Procedure(s) (LRB): OPEN REDUCTION INTERNAL FIXATION (ORIF) METACARPAL (FINGER) FRACTURE (Right)  Patient Location: PACU  Anesthesia Type: General Level of Consciousness: awake and alert   Airway and Oxygen Therapy: Patient Spontanous Breathing and Patient connected to face mask oxygen  Post-op Pain: mild  Post-op Assessment: Post-op Vital signs reviewed, Patient's Cardiovascular Status Stable, Respiratory Function Stable, Patent Airway, No signs of Nausea or vomiting and Pain level controlled  Post-op Vital Signs: Reviewed and stable  Complications: No apparent anesthesia complications

## 2012-02-08 NOTE — Anesthesia Procedure Notes (Signed)
Procedure Name: LMA Insertion Date/Time: 02/08/2012 9:45 AM Performed by: Gar Gibbon Pre-anesthesia Checklist: Patient identified, Emergency Drugs available, Suction available and Patient being monitored Patient Re-evaluated:Patient Re-evaluated prior to inductionOxygen Delivery Method: Circle system utilized Preoxygenation: Pre-oxygenation with 100% oxygen Intubation Type: IV induction Ventilation: Mask ventilation without difficulty LMA: LMA with gastric port inserted LMA Size: 5.0 Number of attempts: 2 Placement Confirmation: positive ETCO2 Tube secured with: Tape Dental Injury: Teeth and Oropharynx as per pre-operative assessment
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
## 2012-02-12 ENCOUNTER — Encounter (HOSPITAL_BASED_OUTPATIENT_CLINIC_OR_DEPARTMENT_OTHER): Payer: Self-pay | Admitting: Orthopedic Surgery

## 2012-02-14 DIAGNOSIS — S63279A Dislocation of unspecified interphalangeal joint of unspecified finger, initial encounter: Secondary | ICD-10-CM | POA: Diagnosis not present

## 2012-02-14 DIAGNOSIS — S62639A Displaced fracture of distal phalanx of unspecified finger, initial encounter for closed fracture: Secondary | ICD-10-CM | POA: Diagnosis not present

## 2012-02-23 ENCOUNTER — Other Ambulatory Visit: Payer: Self-pay | Admitting: Internal Medicine

## 2012-02-28 DIAGNOSIS — S62639A Displaced fracture of distal phalanx of unspecified finger, initial encounter for closed fracture: Secondary | ICD-10-CM | POA: Diagnosis not present

## 2012-02-28 DIAGNOSIS — S63279A Dislocation of unspecified interphalangeal joint of unspecified finger, initial encounter: Secondary | ICD-10-CM | POA: Diagnosis not present

## 2012-03-11 DIAGNOSIS — S62639A Displaced fracture of distal phalanx of unspecified finger, initial encounter for closed fracture: Secondary | ICD-10-CM | POA: Diagnosis not present

## 2012-03-11 DIAGNOSIS — S63279A Dislocation of unspecified interphalangeal joint of unspecified finger, initial encounter: Secondary | ICD-10-CM | POA: Diagnosis not present

## 2012-03-26 DIAGNOSIS — L259 Unspecified contact dermatitis, unspecified cause: Secondary | ICD-10-CM | POA: Diagnosis not present

## 2012-03-26 DIAGNOSIS — D239 Other benign neoplasm of skin, unspecified: Secondary | ICD-10-CM | POA: Diagnosis not present

## 2012-03-26 DIAGNOSIS — L821 Other seborrheic keratosis: Secondary | ICD-10-CM | POA: Diagnosis not present

## 2012-04-01 ENCOUNTER — Ambulatory Visit: Payer: Medicare Other | Admitting: Internal Medicine

## 2012-04-08 ENCOUNTER — Encounter: Payer: Self-pay | Admitting: Internal Medicine

## 2012-04-08 ENCOUNTER — Other Ambulatory Visit (INDEPENDENT_AMBULATORY_CARE_PROVIDER_SITE_OTHER): Payer: Medicare Other

## 2012-04-08 ENCOUNTER — Ambulatory Visit (INDEPENDENT_AMBULATORY_CARE_PROVIDER_SITE_OTHER): Payer: Medicare Other | Admitting: Internal Medicine

## 2012-04-08 VITALS — BP 128/72 | HR 60 | Temp 98.1°F | Resp 16 | Ht 74.0 in | Wt 235.0 lb

## 2012-04-08 DIAGNOSIS — E785 Hyperlipidemia, unspecified: Secondary | ICD-10-CM

## 2012-04-08 DIAGNOSIS — E1149 Type 2 diabetes mellitus with other diabetic neurological complication: Secondary | ICD-10-CM

## 2012-04-08 DIAGNOSIS — I10 Essential (primary) hypertension: Secondary | ICD-10-CM

## 2012-04-08 DIAGNOSIS — E1142 Type 2 diabetes mellitus with diabetic polyneuropathy: Secondary | ICD-10-CM | POA: Diagnosis not present

## 2012-04-08 LAB — LIPID PANEL
Cholesterol: 139 mg/dL (ref 0–200)
HDL: 47.2 mg/dL (ref >39.00)
LDL Cholesterol: 82 mg/dL (ref 0–99)
Total CHOL/HDL Ratio: 3
Triglycerides: 50 mg/dL (ref 0.0–149.0)
VLDL: 10 mg/dL (ref 0.0–40.0)

## 2012-04-08 LAB — COMPREHENSIVE METABOLIC PANEL
ALT: 23 U/L (ref 0–53)
AST: 20 U/L (ref 0–37)
Albumin: 4.2 g/dL (ref 3.5–5.2)
Alkaline Phosphatase: 49 U/L (ref 39–117)
BUN: 26 mg/dL — ABNORMAL HIGH (ref 6–23)
CO2: 29 mEq/L (ref 19–32)
Calcium: 9.7 mg/dL (ref 8.4–10.5)
Chloride: 106 mEq/L (ref 96–112)
Creatinine, Ser: 1.1 mg/dL (ref 0.4–1.5)
GFR: 71.77 mL/min (ref >60.00)
Glucose, Bld: 97 mg/dL (ref 70–99)
Potassium: 4 mEq/L (ref 3.5–5.1)
Sodium: 142 mEq/L (ref 135–145)
Total Bilirubin: 0.8 mg/dL (ref 0.3–1.2)
Total Protein: 6.8 g/dL (ref 6.0–8.3)

## 2012-04-08 LAB — HEMOGLOBIN A1C: Hgb A1c MFr Bld: 5.9 % (ref 4.6–6.5)

## 2012-04-08 LAB — HM DIABETES FOOT EXAM: HM Diabetic Foot Exam: NORMAL

## 2012-04-08 NOTE — Assessment & Plan Note (Signed)
I will recheck his a1c today to see if he needs a treatment for his DM II

## 2012-04-08 NOTE — Progress Notes (Signed)
Subjective:    Patient ID: Thomas Chung, male    DOB: 04-16-1941, 71 y.o.   MRN: [id number]  Hypertension This is a chronic problem. The current episode started more than 1 year ago. The problem has been gradually improving since onset. The problem is controlled. Pertinent negatives include no anxiety, blurred vision, chest pain, headaches, malaise/fatigue, neck pain, orthopnea, palpitations, peripheral edema, PND, shortness of breath or sweats. There are no associated agents to hypertension. Past treatments include angiotensin blockers, beta blockers and diuretics. The current treatment provides significant improvement. There are no compliance problems.   Diabetes He presents for his follow-up diabetic visit. He has type 2 diabetes mellitus. His disease course has been improving. Pertinent negatives for hypoglycemia include no dizziness, headaches, pallor, seizures, speech difficulty, sweats or tremors. Pertinent negatives for diabetes include no blurred vision, no chest pain, no fatigue, no foot paresthesias, no foot ulcerations, no polydipsia, no polyphagia, no polyuria, no visual change, no weakness and no weight loss. There are no hypoglycemic complications. Symptoms are improving. Diabetic complications include peripheral neuropathy. Current diabetic treatment includes diet. He is compliant with treatment all of the time. His weight is stable. He is following a generally healthy diet. He participates in exercise daily. There is no change in his home blood glucose trend. His breakfast blood glucose range is generally 70-90 mg/dl. His lunch blood glucose range is generally 90-110 mg/dl. His dinner blood glucose range is generally 90-110 mg/dl. His highest blood glucose is 90-110 mg/dl. His overall blood glucose range is 90-110 mg/dl. An ACE inhibitor/angiotensin II receptor blocker is being taken. He sees a podiatrist.Eye exam is current.      Review of Systems  Constitutional: Positive for  unexpected weight change (some weight gain). Negative for fever, chills, weight loss, malaise/fatigue, diaphoresis, activity change, appetite change and fatigue.  HENT: Negative.  Negative for neck pain.   Eyes: Negative.  Negative for blurred vision.  Respiratory: Negative for apnea, cough, choking, chest tightness, shortness of breath, wheezing and stridor.   Cardiovascular: Negative for chest pain, palpitations, orthopnea, leg swelling and PND.  Gastrointestinal: Negative for nausea, vomiting, abdominal pain, diarrhea, constipation and abdominal distention.  Genitourinary: Negative.  Negative for polyuria.  Musculoskeletal: Negative for myalgias, back pain, joint swelling, arthralgias and gait problem.  Skin: Negative for color change, pallor, rash and wound.  Neurological: Negative for dizziness, tremors, seizures, syncope, facial asymmetry, speech difficulty, weakness, light-headedness, numbness and headaches.  Hematological: Negative for polydipsia, polyphagia and adenopathy. Does not bruise/bleed easily.  Psychiatric/Behavioral: Negative.        Objective:   Physical Exam  Vitals reviewed. Constitutional: He is oriented to person, place, and time. He appears well-developed and well-nourished. No distress.  HENT:  Head: Normocephalic and atraumatic.  Mouth/Throat: Oropharynx is clear and moist. No oropharyngeal exudate.  Eyes: Conjunctivae are normal. Right eye exhibits no discharge. Left eye exhibits no discharge. No scleral icterus.  Neck: Normal range of motion. Neck supple. No JVD present. No tracheal deviation present. No thyromegaly present.  Cardiovascular: Normal rate, regular rhythm, normal heart sounds and intact distal pulses.  Exam reveals no gallop and no friction rub.   No murmur heard. Pulmonary/Chest: Effort normal and breath sounds normal. No stridor. No respiratory distress. He has no wheezes. He has no rales. He exhibits no tenderness.  Abdominal: Soft. Bowel  sounds are normal. He exhibits no distension and no mass. There is no tenderness. There is no rebound and no guarding.  Musculoskeletal: Normal range of  motion. He exhibits no edema and no tenderness.  Lymphadenopathy:    He has no cervical adenopathy.  Neurological: He is oriented to person, place, and time.  Skin: Skin is warm and dry. No rash noted. He is not diaphoretic. No erythema. No pallor.  Psychiatric: He has a normal mood and affect. His behavior is normal. Judgment and thought content normal.      Lab Results  Component Value Date   WBC 6.5 11/28/2011   HGB 15.1 11/28/2011   HCT 43.8 11/28/2011   PLT 198.0 11/28/2011   GLUCOSE 89 02/07/2012   CHOL 148 11/28/2011   TRIG 51.0 11/28/2011   HDL 58.60 11/28/2011   LDLCALC 79 11/28/2011   ALT 22 11/28/2011   AST 20 11/28/2011   NA 140 02/07/2012   K 4.5 02/07/2012   CL 103 02/07/2012   CREATININE 0.99 02/07/2012   BUN 29* 02/07/2012   CO2 28 02/07/2012   TSH 0.86 04/21/2011   PSA 0.10 07/17/2011   HGBA1C 5.5 11/28/2011      Assessment & Plan:

## 2012-04-08 NOTE — Patient Instructions (Signed)

## 2012-04-08 NOTE — Assessment & Plan Note (Signed)
He is doing well on lipitor 

## 2012-04-08 NOTE — Assessment & Plan Note (Signed)
His BP is well controlled, I will check his lytes and renal function 

## 2012-06-06 ENCOUNTER — Other Ambulatory Visit: Payer: Self-pay

## 2012-06-06 ENCOUNTER — Other Ambulatory Visit: Payer: Self-pay | Admitting: Internal Medicine

## 2012-06-06 MED ORDER — GLUCOSE BLOOD VI STRP: ORAL_STRIP | Status: DC

## 2012-06-26 ENCOUNTER — Other Ambulatory Visit: Payer: Self-pay | Admitting: Internal Medicine

## 2012-07-25 DIAGNOSIS — E119 Type 2 diabetes mellitus without complications: Secondary | ICD-10-CM | POA: Diagnosis not present

## 2012-07-25 LAB — HM DIABETES EYE EXAM

## 2012-07-31 DIAGNOSIS — M898X9 Other specified disorders of bone, unspecified site: Secondary | ICD-10-CM | POA: Diagnosis not present

## 2012-07-31 DIAGNOSIS — E1149 Type 2 diabetes mellitus with other diabetic neurological complication: Secondary | ICD-10-CM | POA: Diagnosis not present

## 2012-07-31 DIAGNOSIS — L608 Other nail disorders: Secondary | ICD-10-CM | POA: Diagnosis not present

## 2012-07-31 DIAGNOSIS — G609 Hereditary and idiopathic neuropathy, unspecified: Secondary | ICD-10-CM | POA: Diagnosis not present

## 2012-07-31 DIAGNOSIS — S62639A Displaced fracture of distal phalanx of unspecified finger, initial encounter for closed fracture: Secondary | ICD-10-CM | POA: Diagnosis not present

## 2012-08-12 ENCOUNTER — Ambulatory Visit (INDEPENDENT_AMBULATORY_CARE_PROVIDER_SITE_OTHER): Payer: Medicare Other | Admitting: Internal Medicine

## 2012-08-12 ENCOUNTER — Encounter: Payer: Self-pay | Admitting: Internal Medicine

## 2012-08-12 ENCOUNTER — Other Ambulatory Visit (INDEPENDENT_AMBULATORY_CARE_PROVIDER_SITE_OTHER): Payer: Medicare Other

## 2012-08-12 VITALS — BP 110/60 | HR 55 | Temp 98.0°F | Resp 16 | Ht 76.0 in | Wt 235.0 lb

## 2012-08-12 DIAGNOSIS — E1149 Type 2 diabetes mellitus with other diabetic neurological complication: Secondary | ICD-10-CM | POA: Diagnosis not present

## 2012-08-12 DIAGNOSIS — I10 Essential (primary) hypertension: Secondary | ICD-10-CM

## 2012-08-12 DIAGNOSIS — Z0000 Encounter for general adult medical examination without abnormal findings: Secondary | ICD-10-CM

## 2012-08-12 DIAGNOSIS — Z23 Encounter for immunization: Secondary | ICD-10-CM

## 2012-08-12 DIAGNOSIS — N403 Nodular prostate with lower urinary tract symptoms: Secondary | ICD-10-CM

## 2012-08-12 DIAGNOSIS — N400 Enlarged prostate without lower urinary tract symptoms: Secondary | ICD-10-CM

## 2012-08-12 DIAGNOSIS — H6123 Impacted cerumen, bilateral: Secondary | ICD-10-CM | POA: Insufficient documentation

## 2012-08-12 DIAGNOSIS — E1142 Type 2 diabetes mellitus with diabetic polyneuropathy: Secondary | ICD-10-CM | POA: Diagnosis not present

## 2012-08-12 DIAGNOSIS — N138 Other obstructive and reflux uropathy: Secondary | ICD-10-CM

## 2012-08-12 DIAGNOSIS — H9190 Unspecified hearing loss, unspecified ear: Secondary | ICD-10-CM

## 2012-08-12 LAB — FECAL OCCULT BLOOD, GUAIAC: Fecal Occult Blood: NEGATIVE

## 2012-08-12 LAB — HEMOGLOBIN A1C: Hgb A1c MFr Bld: 6 % (ref 4.6–6.5)

## 2012-08-12 LAB — HM DIABETES FOOT EXAM: HM Diabetic Foot Exam: NORMAL

## 2012-08-12 NOTE — Assessment & Plan Note (Signed)
His BP is well controlled, I will check his lytes and renal function 

## 2012-08-12 NOTE — Patient Instructions (Signed)
Health Maintenance, Males A healthy lifestyle and preventative care can promote health and wellness.  Maintain regular health, dental, and eye exams.   Eat a healthy diet. Foods like vegetables, fruits, whole grains, low-fat dairy products, and lean protein foods contain the nutrients you need without too many calories. Decrease your intake of foods high in solid fats, added sugars, and salt. Get information about a proper diet from your caregiver, if necessary.   Regular physical exercise is one of the most important things you can do for your health. Most adults should get at least 150 minutes of moderate-intensity exercise (any activity that increases your heart rate and causes you to sweat) each week. In addition, most adults need muscle-strengthening exercises on 2 or more days a week.    Maintain a healthy weight. The body mass index (BMI) is a screening tool to identify possible weight problems. It provides an estimate of body fat based on height and weight. Your caregiver can help determine your BMI, and can help you achieve or maintain a healthy weight. For adults 20 years and older:   A BMI below 18.5 is considered underweight.   A BMI of 18.5 to 24.9 is normal.   A BMI of 25 to 29.9 is considered overweight.   A BMI of 30 and above is considered obese.   Maintain normal blood lipids and cholesterol by exercising and minimizing your intake of saturated fat. Eat a balanced diet with plenty of fruits and vegetables. Blood tests for lipids and cholesterol should begin at age 20 and be repeated every 5 years. If your lipid or cholesterol levels are high, you are over 50, or you are a high risk for heart disease, you may need your cholesterol levels checked more frequently.Ongoing high lipid and cholesterol levels should be treated with medicines, if diet and exercise are not effective.   If you smoke, find out from your caregiver how to quit. If you do not use tobacco, do not start.    If you choose to drink alcohol, do not exceed 2 drinks per day. One drink is considered to be 12 ounces (355 mL) of beer, 5 ounces (148 mL) of wine, or 1.5 ounces (44 mL) of liquor.   Avoid use of street drugs. Do not share needles with anyone. Ask for help if you need support or instructions about stopping the use of drugs.   High blood pressure causes heart disease and increases the risk of stroke. Blood pressure should be checked at least every 1 to 2 years. Ongoing high blood pressure should be treated with medicines if weight loss and exercise are not effective.   If you are 45 to 70 years old, ask your caregiver if you should take aspirin to prevent heart disease.   Diabetes screening involves taking a blood sample to check your fasting blood sugar level. This should be done once every 3 years, after age 45, if you are within normal weight and without risk factors for diabetes. Testing should be considered at a younger age or be carried out more frequently if you are overweight and have at least 1 risk factor for diabetes.   Colorectal cancer can be detected and often prevented. Most routine colorectal cancer screening begins at the age of 50 and continues through age 75. However, your caregiver may recommend screening at an earlier age if you have risk factors for colon cancer. On a yearly basis, your caregiver may provide home test kits to check for hidden   blood in the stool. Use of a small camera at the end of a tube, to directly examine the colon (sigmoidoscopy or colonoscopy), can detect the earliest forms of colorectal cancer. Talk to your caregiver about this at age 50, when routine screening begins. Direct examination of the colon should be repeated every 5 to 10 years through age 75, unless early forms of pre-cancerous polyps or small growths are found.   Hepatitis C blood testing is recommended for all people born from 1945 through 1965 and any individual with known risks for  hepatitis C.   Healthy men should no longer receive prostate-specific antigen (PSA) blood tests as part of routine cancer screening. Consult with your caregiver about prostate cancer screening.   Testicular cancer screening is not recommended for adolescents or adult males who have no symptoms. Screening includes self-exam, caregiver exam, and other screening tests. Consult with your caregiver about any symptoms you have or any concerns you have about testicular cancer.   Practice safe sex. Use condoms and avoid high-risk sexual practices to reduce the spread of sexually transmitted infections (STIs).   Use sunscreen with a sun protection factor (SPF) of 30 or greater. Apply sunscreen liberally and repeatedly throughout the day. You should seek shade when your shadow is shorter than you. Protect yourself by wearing long sleeves, pants, a wide-brimmed hat, and sunglasses year round, whenever you are outdoors.   Notify your caregiver of new moles or changes in moles, especially if there is a change in shape or color. Also notify your caregiver if a mole is larger than the size of a pencil eraser.   A one-time screening for abdominal aortic aneurysm (AAA) and surgical repair of large AAAs by sound wave imaging (ultrasonography) is recommended for ages 65 to 75 years who are current or former smokers.   Stay current with your immunizations.  Document Released: 05/04/2008 Document Revised: 10/26/2011 Document Reviewed: 04/03/2011 [employer] Patient Information 2012 [employer]. 

## 2012-08-12 NOTE — Assessment & Plan Note (Signed)
For an a1c today

## 2012-08-12 NOTE — Assessment & Plan Note (Signed)
Audiology referral.

## 2012-08-12 NOTE — Progress Notes (Signed)
Subjective:    Patient ID: Thomas Chung, male    DOB: [birth year], 71 y.o.   MRN: [id number]  Diabetes He presents for his follow-up diabetic visit. He has type 2 diabetes mellitus. His disease course has been stable. There are no hypoglycemic associated symptoms. Pertinent negatives for hypoglycemia include no pallor. Associated symptoms include foot paresthesias. Pertinent negatives for diabetes include no blurred vision, no chest pain, no fatigue, no foot ulcerations, no polydipsia, no polyphagia, no polyuria, no visual change, no weakness and no weight loss. There are no hypoglycemic complications. Symptoms are stable. Diabetic complications include peripheral neuropathy. Current diabetic treatment includes diet. He is compliant with treatment all of the time. His weight is stable. He is following a generally healthy diet. Meal planning includes avoidance of concentrated sweets. He has had a previous visit with a dietician. He participates in exercise daily. There is no change in his home blood glucose trend. An ACE inhibitor/angiotensin II receptor blocker is being taken. He sees a podiatrist.Eye exam is current.      Review of Systems  Constitutional: Negative for fever, chills, weight loss, diaphoresis, activity change, appetite change, fatigue and unexpected weight change.  HENT: Positive for hearing loss. Negative for ear pain, nosebleeds, congestion, rhinorrhea, sneezing, neck pain, neck stiffness, voice change, postnasal drip, tinnitus and ear discharge.   Eyes: Negative.  Negative for blurred vision.  Respiratory: Negative for apnea, cough, choking, chest tightness, shortness of breath, wheezing and stridor.   Cardiovascular: Negative for chest pain, palpitations and leg swelling.  Gastrointestinal: Negative for nausea, vomiting, abdominal pain, diarrhea, constipation, blood in stool and abdominal distention.  Genitourinary: Positive for difficulty urinating (weak urine stream).  Negative for dysuria, urgency, polyuria, frequency, hematuria, flank pain, decreased urine volume, discharge, penile swelling, scrotal swelling, enuresis, genital sores, penile pain and testicular pain.  Musculoskeletal: Negative for myalgias, back pain, joint swelling, arthralgias and gait problem.  Skin: Negative for color change, pallor, rash and wound.  Neurological: Negative.  Negative for weakness.  Hematological: Negative for polydipsia, polyphagia and adenopathy. Does not bruise/bleed easily.  Psychiatric/Behavioral: Negative.        Objective:   Physical Exam  Vitals reviewed. Constitutional: He is oriented to person, place, and time. He appears well-developed and well-nourished. No distress.  HENT:  Head: Normocephalic and atraumatic.  Mouth/Throat: Oropharynx is clear and moist. No oropharyngeal exudate.  Eyes: Conjunctivae normal are normal. Right eye exhibits no discharge. Left eye exhibits no discharge. No scleral icterus.  Neck: Normal range of motion. Neck supple. No JVD present. No tracheal deviation present. No thyromegaly present.  Cardiovascular: Normal rate, regular rhythm, normal heart sounds and intact distal pulses.  Exam reveals no gallop and no friction rub.   No murmur heard. Pulmonary/Chest: Effort normal and breath sounds normal. No stridor. No respiratory distress. He has no wheezes. He has no rales. He exhibits no tenderness.  Abdominal: Soft. Bowel sounds are normal. He exhibits no distension and no mass. There is no tenderness. There is no rebound and no guarding. Hernia confirmed negative in the right inguinal area and confirmed negative in the left inguinal area.  Genitourinary: Rectum normal, testes normal and penis normal. Rectal exam shows no external hemorrhoid, no internal hemorrhoid, no fissure, no mass, no tenderness and anal tone normal. Guaiac negative stool. Prostate is enlarged (small enlargement in the left lobe). Prostate is not tender. Right  testis shows no mass, no swelling and no tenderness. Right testis is descended. Left testis shows no mass,  no swelling and no tenderness. Left testis is descended. Circumcised. No paraphimosis, penile erythema or penile tenderness. No discharge found.  Musculoskeletal: Normal range of motion. He exhibits no edema and no tenderness.  Lymphadenopathy:    He has no cervical adenopathy.       Right: No inguinal adenopathy present.       Left: No inguinal adenopathy present.  Neurological: He is oriented to person, place, and time.  Skin: Skin is warm and dry. No rash noted. He is not diaphoretic. No erythema. No pallor.  Psychiatric: He has a normal mood and affect. His behavior is normal. Judgment and thought content normal.      Lab Results  Component Value Date   WBC 6.5 11/28/2011   HGB 15.1 11/28/2011   HCT 43.8 11/28/2011   PLT 198.0 11/28/2011   GLUCOSE 97 04/08/2012   CHOL 139 04/08/2012   TRIG 50.0 04/08/2012   HDL 47.20 04/08/2012   LDLCALC 82 04/08/2012   ALT 23 04/08/2012   AST 20 04/08/2012   NA 142 04/08/2012   K 4.0 04/08/2012   CL 106 04/08/2012   CREATININE 1.1 04/08/2012   BUN 26* 04/08/2012   CO2 29 04/08/2012   TSH 0.86 04/21/2011   PSA 0.10 07/17/2011   HGBA1C 5.9 04/08/2012      Assessment & Plan:

## 2012-08-12 NOTE — Assessment & Plan Note (Addendum)

## 2012-08-12 NOTE — Assessment & Plan Note (Signed)
I will check his PSA today 

## 2012-08-13 ENCOUNTER — Encounter: Payer: Self-pay | Admitting: Internal Medicine

## 2012-08-13 LAB — COMPREHENSIVE METABOLIC PANEL
ALT: 27 U/L (ref 0–53)
AST: 24 U/L (ref 0–37)
Albumin: 4.2 g/dL (ref 3.5–5.2)
Alkaline Phosphatase: 43 U/L (ref 39–117)
BUN: 26 mg/dL — ABNORMAL HIGH (ref 6–23)
CO2: 28 mEq/L (ref 19–32)
Calcium: 9.5 mg/dL (ref 8.4–10.5)
Chloride: 105 mEq/L (ref 96–112)
Creatinine, Ser: 1 mg/dL (ref 0.4–1.5)
GFR: 78.36 mL/min (ref >60.00)
Glucose, Bld: 96 mg/dL (ref 70–99)
Potassium: 4.1 mEq/L (ref 3.5–5.1)
Sodium: 142 mEq/L (ref 135–145)
Total Bilirubin: 0.8 mg/dL (ref 0.3–1.2)
Total Protein: 6.4 g/dL (ref 6.0–8.3)

## 2012-08-13 LAB — PSA: PSA: 0.07 ng/mL — ABNORMAL LOW (ref 0.10–4.00)

## 2012-08-27 DIAGNOSIS — E119 Type 2 diabetes mellitus without complications: Secondary | ICD-10-CM | POA: Diagnosis not present

## 2012-08-27 DIAGNOSIS — G9009 Other idiopathic peripheral autonomic neuropathy: Secondary | ICD-10-CM | POA: Diagnosis not present

## 2012-08-27 DIAGNOSIS — M79609 Pain in unspecified limb: Secondary | ICD-10-CM | POA: Diagnosis not present

## 2012-09-02 DIAGNOSIS — H903 Sensorineural hearing loss, bilateral: Secondary | ICD-10-CM | POA: Diagnosis not present

## 2012-09-30 DIAGNOSIS — L608 Other nail disorders: Secondary | ICD-10-CM | POA: Diagnosis not present

## 2012-09-30 DIAGNOSIS — E1149 Type 2 diabetes mellitus with other diabetic neurological complication: Secondary | ICD-10-CM | POA: Diagnosis not present

## 2012-10-07 ENCOUNTER — Other Ambulatory Visit: Payer: Self-pay | Admitting: *Deleted

## 2012-10-07 MED ORDER — LOSARTAN POTASSIUM-HCTZ 100-12.5 MG PO TABS: ORAL_TABLET | ORAL | Status: DC

## 2012-10-07 NOTE — Telephone Encounter (Signed)
R'cd fax from [facility] for refill of Losartan-HCTZ  

## 2012-12-07 ENCOUNTER — Other Ambulatory Visit: Payer: Self-pay | Admitting: Internal Medicine

## 2012-12-09 ENCOUNTER — Other Ambulatory Visit (INDEPENDENT_AMBULATORY_CARE_PROVIDER_SITE_OTHER): Payer: Medicare Other

## 2012-12-09 ENCOUNTER — Encounter: Payer: Self-pay | Admitting: Internal Medicine

## 2012-12-09 ENCOUNTER — Ambulatory Visit (INDEPENDENT_AMBULATORY_CARE_PROVIDER_SITE_OTHER): Payer: Medicare Other | Admitting: Internal Medicine

## 2012-12-09 VITALS — BP 140/70 | HR 100 | Temp 98.4°F | Resp 16 | Wt 239.0 lb

## 2012-12-09 DIAGNOSIS — G609 Hereditary and idiopathic neuropathy, unspecified: Secondary | ICD-10-CM

## 2012-12-09 DIAGNOSIS — S20219A Contusion of unspecified front wall of thorax, initial encounter: Secondary | ICD-10-CM

## 2012-12-09 DIAGNOSIS — B351 Tinea unguium: Secondary | ICD-10-CM | POA: Diagnosis not present

## 2012-12-09 DIAGNOSIS — E1149 Type 2 diabetes mellitus with other diabetic neurological complication: Secondary | ICD-10-CM

## 2012-12-09 DIAGNOSIS — I10 Essential (primary) hypertension: Secondary | ICD-10-CM

## 2012-12-09 LAB — BASIC METABOLIC PANEL
BUN: 30 mg/dL — ABNORMAL HIGH (ref 6–23)
CO2: 25 mEq/L (ref 19–32)
Calcium: 9.7 mg/dL (ref 8.4–10.5)
Chloride: 103 mEq/L (ref 96–112)
Creatinine, Ser: 1.1 mg/dL (ref 0.4–1.5)
GFR: 67.99 mL/min (ref >60.00)
Glucose, Bld: 100 mg/dL — ABNORMAL HIGH (ref 70–99)
Potassium: 4 mEq/L (ref 3.5–5.1)
Sodium: 139 mEq/L (ref 135–145)

## 2012-12-09 LAB — HEMOGLOBIN A1C: Hgb A1c MFr Bld: 5.8 % (ref 4.6–6.5)

## 2012-12-09 NOTE — Assessment & Plan Note (Signed)
He is not willing to take Metanx (too expensive)

## 2012-12-09 NOTE — Progress Notes (Signed)
Subjective:    Patient ID: Thomas Chung, male    DOB: 13-Jun-1941, 72 y.o.   MRN: [id number]  Diabetes He presents for his follow-up diabetic visit. He has type 2 diabetes mellitus. His disease course has been stable. There are no hypoglycemic associated symptoms. Pertinent negatives for hypoglycemia include no dizziness or tremors. Associated symptoms include foot paresthesias. Pertinent negatives for diabetes include no blurred vision, no chest pain, no fatigue, no foot ulcerations, no polydipsia, no polyphagia, no polyuria, no visual change, no weakness and no weight loss. There are no hypoglycemic complications. Diabetic complications include peripheral neuropathy. Current diabetic treatment includes diet. He is compliant with treatment all of the time. His weight is stable. He is following a generally healthy diet. Meal planning includes avoidance of concentrated sweets. He has not had a previous visit with a dietician. He participates in exercise daily. There is no change in his home blood glucose trend. His breakfast blood glucose range is generally 90-110 mg/dl. His lunch blood glucose range is generally 90-110 mg/dl. His dinner blood glucose range is generally 90-110 mg/dl. His highest blood glucose is 90-110 mg/dl. His overall blood glucose range is 90-110 mg/dl. An ACE inhibitor/angiotensin II receptor blocker is being taken. He sees a podiatrist.Eye exam is current.      Review of Systems  Constitutional: Negative for fever, chills, weight loss, diaphoresis, activity change, appetite change, fatigue and unexpected weight change.  HENT: Negative.   Eyes: Negative.  Negative for blurred vision.  Respiratory: Negative for cough, chest tightness, shortness of breath, wheezing and stridor.   Cardiovascular: Negative for chest pain, palpitations and leg swelling.  Gastrointestinal: Negative.   Genitourinary: Negative.  Negative for polyuria.  Musculoskeletal: Negative for myalgias, back  pain, joint swelling, arthralgias and gait problem.  Skin: Negative.   Neurological: Positive for numbness (in both feet). Negative for dizziness, tremors, weakness and light-headedness.  Hematological: Negative for polydipsia, polyphagia and adenopathy. Does not bruise/bleed easily.  Psychiatric/Behavioral: Negative.        Objective:   Physical Exam  Vitals reviewed. Constitutional: He is oriented to person, place, and time. He appears well-developed and well-nourished. No distress.  HENT:  Head: Normocephalic and atraumatic.  Mouth/Throat: Oropharynx is clear and moist. No oropharyngeal exudate.  Eyes: Conjunctivae normal are normal. Right eye exhibits no discharge. Left eye exhibits no discharge. No scleral icterus.  Neck: Normal range of motion. Neck supple. No JVD present. No tracheal deviation present. No thyromegaly present.  Cardiovascular: Normal rate, regular rhythm, normal heart sounds and intact distal pulses.  Exam reveals no gallop and no friction rub.   No murmur heard. Pulmonary/Chest: Effort normal and breath sounds normal. No stridor. No respiratory distress. He has no wheezes. He has no rales. He exhibits no tenderness.  Abdominal: Soft. Bowel sounds are normal. He exhibits no distension and no mass. There is no tenderness. There is no rebound and no guarding.  Musculoskeletal: Normal range of motion. He exhibits no edema and no tenderness.  Lymphadenopathy:    He has no cervical adenopathy.  Neurological: He is oriented to person, place, and time.  Skin: Skin is warm and dry. No rash noted. He is not diaphoretic. No erythema. No pallor.  Psychiatric: He has a normal mood and affect. His behavior is normal. Judgment and thought content normal.      Lab Results  Component Value Date   WBC 6.5 11/28/2011   HGB 15.1 11/28/2011   HCT 43.8 11/28/2011   PLT 198.0 11/28/2011  GLUCOSE 96 08/12/2012   CHOL 139 04/08/2012   TRIG 50.0 04/08/2012   HDL 47.20 04/08/2012   LDLCALC  82 04/08/2012   ALT 27 08/12/2012   AST 24 08/12/2012   NA 142 08/12/2012   K 4.1 08/12/2012   CL 105 08/12/2012   CREATININE 1.0 08/12/2012   BUN 26* 08/12/2012   CO2 28 08/12/2012   TSH 0.86 04/21/2011   PSA 0.07* 08/12/2012   HGBA1C 6.0 08/12/2012   Assessment & Plan:

## 2012-12-09 NOTE — Assessment & Plan Note (Signed)
I will recheck his a1c and will address if needed

## 2012-12-09 NOTE — Patient Instructions (Signed)

## 2012-12-09 NOTE — Assessment & Plan Note (Signed)
He wants to see a new podiatrist

## 2012-12-09 NOTE — Assessment & Plan Note (Signed)
His BP is well controlled I will check his lytes and renal function 

## 2012-12-10 ENCOUNTER — Ambulatory Visit: Payer: Medicare Other | Admitting: Internal Medicine

## 2013-01-16 DIAGNOSIS — E1149 Type 2 diabetes mellitus with other diabetic neurological complication: Secondary | ICD-10-CM | POA: Diagnosis not present

## 2013-02-12 DIAGNOSIS — E119 Type 2 diabetes mellitus without complications: Secondary | ICD-10-CM | POA: Diagnosis not present

## 2013-04-03 DIAGNOSIS — L608 Other nail disorders: Secondary | ICD-10-CM | POA: Diagnosis not present

## 2013-04-03 DIAGNOSIS — L84 Corns and callosities: Secondary | ICD-10-CM | POA: Diagnosis not present

## 2013-04-03 DIAGNOSIS — R269 Unspecified abnormalities of gait and mobility: Secondary | ICD-10-CM | POA: Diagnosis not present

## 2013-04-03 DIAGNOSIS — I739 Peripheral vascular disease, unspecified: Secondary | ICD-10-CM | POA: Diagnosis not present

## 2013-04-03 DIAGNOSIS — E1159 Type 2 diabetes mellitus with other circulatory complications: Secondary | ICD-10-CM | POA: Diagnosis not present

## 2013-04-07 ENCOUNTER — Ambulatory Visit (INDEPENDENT_AMBULATORY_CARE_PROVIDER_SITE_OTHER): Payer: Medicare Other | Admitting: Internal Medicine

## 2013-04-07 ENCOUNTER — Encounter: Payer: Self-pay | Admitting: Internal Medicine

## 2013-04-07 ENCOUNTER — Other Ambulatory Visit (INDEPENDENT_AMBULATORY_CARE_PROVIDER_SITE_OTHER): Payer: Medicare Other

## 2013-04-07 ENCOUNTER — Other Ambulatory Visit: Payer: Self-pay | Admitting: Internal Medicine

## 2013-04-07 VITALS — BP 148/70 | HR 64 | Temp 97.2°F | Resp 20 | Wt 248.0 lb

## 2013-04-07 DIAGNOSIS — E1149 Type 2 diabetes mellitus with other diabetic neurological complication: Secondary | ICD-10-CM

## 2013-04-07 DIAGNOSIS — E785 Hyperlipidemia, unspecified: Secondary | ICD-10-CM

## 2013-04-07 DIAGNOSIS — I10 Essential (primary) hypertension: Secondary | ICD-10-CM

## 2013-04-07 LAB — LIPID PANEL
Cholesterol: 161 mg/dL (ref 0–200)
HDL: 43.6 mg/dL (ref >39.00)
LDL Cholesterol: 87 mg/dL (ref 0–99)
Total CHOL/HDL Ratio: 4
Triglycerides: 151 mg/dL — ABNORMAL HIGH (ref 0.0–149.0)
VLDL: 30.2 mg/dL (ref 0.0–40.0)

## 2013-04-07 LAB — COMPREHENSIVE METABOLIC PANEL
ALT: 26 U/L (ref 0–53)
AST: 23 U/L (ref 0–37)
Albumin: 3.9 g/dL (ref 3.5–5.2)
Alkaline Phosphatase: 61 U/L (ref 39–117)
BUN: 16 mg/dL (ref 6–23)
CO2: 28 mEq/L (ref 19–32)
Calcium: 9.5 mg/dL (ref 8.4–10.5)
Chloride: 103 mEq/L (ref 96–112)
Creatinine, Ser: 1 mg/dL (ref 0.4–1.5)
GFR: 81.99 mL/min (ref >60.00)
Glucose, Bld: 96 mg/dL (ref 70–99)
Potassium: 3.8 mEq/L (ref 3.5–5.1)
Sodium: 141 mEq/L (ref 135–145)
Total Bilirubin: 0.9 mg/dL (ref 0.3–1.2)
Total Protein: 7.1 g/dL (ref 6.0–8.3)

## 2013-04-07 LAB — HEMOGLOBIN A1C: Hgb A1c MFr Bld: 6 % (ref 4.6–6.5)

## 2013-04-07 NOTE — Patient Instructions (Signed)

## 2013-04-07 NOTE — Assessment & Plan Note (Signed)
His BP is adequately well controlled Today I will check his lytes and renal function 

## 2013-04-07 NOTE — Progress Notes (Signed)
Subjective:    Patient ID: Thomas Chung, male    DOB: 12/25/1940, 72 y.o.   MRN: [id number]  Diabetes He presents for his follow-up diabetic visit. He has type 2 diabetes mellitus. His disease course has been stable. Pertinent negatives for diabetes include no blurred vision, no chest pain, no fatigue, no foot paresthesias, no foot ulcerations, no polydipsia, no polyphagia, no polyuria, no visual change, no weakness and no weight loss. Current diabetic treatment includes diet. He is compliant with treatment most of the time. His weight is increasing steadily. He is following a generally healthy diet. Meal planning includes avoidance of concentrated sweets. He has had a previous visit with a dietician. He participates in exercise three times a week. There is no change in his home blood glucose trend. An ACE inhibitor/angiotensin II receptor blocker is being taken. He does not see a podiatrist.Eye exam is current.      Review of Systems  Constitutional: Negative.  Negative for weight loss and fatigue.  HENT: Negative.   Eyes: Negative.  Negative for blurred vision.  Respiratory: Negative.  Negative for cough, chest tightness, shortness of breath, wheezing and stridor.   Cardiovascular: Negative for chest pain, palpitations and leg swelling.  Gastrointestinal: Negative.  Negative for vomiting, abdominal pain, diarrhea and constipation.  Endocrine: Negative.  Negative for polydipsia, polyphagia and polyuria.  Genitourinary: Negative.   Musculoskeletal: Negative.  Negative for myalgias, back pain, joint swelling, arthralgias and gait problem.  Skin: Negative.   Allergic/Immunologic: Negative.   Neurological: Negative.  Negative for weakness.  Hematological: Negative.  Negative for adenopathy. Does not bruise/bleed easily.  Psychiatric/Behavioral: Negative.        Objective:   Physical Exam  Vitals reviewed. Constitutional: He is oriented to person, place, and time. He appears  well-developed and well-nourished. No distress.  HENT:  Head: Normocephalic and atraumatic.  Mouth/Throat: Oropharynx is clear and moist. No oropharyngeal exudate.  Eyes: Conjunctivae are normal. Right eye exhibits no discharge. Left eye exhibits no discharge. No scleral icterus.  Neck: Normal range of motion. Neck supple. No JVD present. No tracheal deviation present. No thyromegaly present.  Cardiovascular: Normal rate, regular rhythm, normal heart sounds and intact distal pulses.  Exam reveals no gallop and no friction rub.   No murmur heard. Pulmonary/Chest: Effort normal and breath sounds normal. No stridor. No respiratory distress. He has no wheezes. He has no rales. He exhibits no tenderness.  Abdominal: Soft. Bowel sounds are normal. He exhibits no distension and no mass. There is no tenderness. There is no rebound and no guarding.  Musculoskeletal: Normal range of motion. He exhibits no edema and no tenderness.  Lymphadenopathy:    He has no cervical adenopathy.  Neurological: He is oriented to person, place, and time.  Skin: Skin is warm and dry. No rash noted. He is not diaphoretic. No erythema. No pallor.  Psychiatric: He has a normal mood and affect. His behavior is normal. Judgment and thought content normal.    Lab Results  Component Value Date   WBC 6.5 11/28/2011   HGB 15.1 11/28/2011   HCT 43.8 11/28/2011   PLT 198.0 11/28/2011   GLUCOSE 100* 12/09/2012   CHOL 139 04/08/2012   TRIG 50.0 04/08/2012   HDL 47.20 04/08/2012   LDLCALC 82 04/08/2012   ALT 27 08/12/2012   AST 24 08/12/2012   NA 139 12/09/2012   K 4.0 12/09/2012   CL 103 12/09/2012   CREATININE 1.1 12/09/2012   BUN 30* 12/09/2012  CO2 25 12/09/2012   TSH 0.86 04/21/2011   PSA 0.07* 08/12/2012   HGBA1C 5.8 12/09/2012        Assessment & Plan:

## 2013-04-07 NOTE — Assessment & Plan Note (Signed)
FLP today 

## 2013-04-07 NOTE — Assessment & Plan Note (Signed)
I will check his A1C today and will address if needed

## 2013-06-16 DIAGNOSIS — E1159 Type 2 diabetes mellitus with other circulatory complications: Secondary | ICD-10-CM | POA: Diagnosis not present

## 2013-06-16 DIAGNOSIS — L608 Other nail disorders: Secondary | ICD-10-CM | POA: Diagnosis not present

## 2013-06-16 DIAGNOSIS — L84 Corns and callosities: Secondary | ICD-10-CM | POA: Diagnosis not present

## 2013-06-16 DIAGNOSIS — I739 Peripheral vascular disease, unspecified: Secondary | ICD-10-CM | POA: Diagnosis not present

## 2013-06-24 ENCOUNTER — Other Ambulatory Visit: Payer: Self-pay | Admitting: Internal Medicine

## 2013-07-12 ENCOUNTER — Other Ambulatory Visit: Payer: Self-pay | Admitting: Internal Medicine

## 2013-08-07 ENCOUNTER — Encounter: Payer: Self-pay | Admitting: Internal Medicine

## 2013-08-07 ENCOUNTER — Other Ambulatory Visit (INDEPENDENT_AMBULATORY_CARE_PROVIDER_SITE_OTHER): Payer: Medicare Other

## 2013-08-07 ENCOUNTER — Ambulatory Visit (INDEPENDENT_AMBULATORY_CARE_PROVIDER_SITE_OTHER): Payer: Medicare Other | Admitting: Internal Medicine

## 2013-08-07 VITALS — BP 122/68 | HR 67 | Temp 97.8°F | Resp 16 | Wt 245.0 lb

## 2013-08-07 DIAGNOSIS — E1140 Type 2 diabetes mellitus with diabetic neuropathy, unspecified: Secondary | ICD-10-CM

## 2013-08-07 DIAGNOSIS — E1149 Type 2 diabetes mellitus with other diabetic neurological complication: Secondary | ICD-10-CM | POA: Diagnosis not present

## 2013-08-07 DIAGNOSIS — E1142 Type 2 diabetes mellitus with diabetic polyneuropathy: Secondary | ICD-10-CM | POA: Diagnosis not present

## 2013-08-07 DIAGNOSIS — Z2911 Encounter for prophylactic immunotherapy for respiratory syncytial virus (RSV): Secondary | ICD-10-CM | POA: Diagnosis not present

## 2013-08-07 DIAGNOSIS — Z23 Encounter for immunization: Secondary | ICD-10-CM

## 2013-08-07 DIAGNOSIS — I10 Essential (primary) hypertension: Secondary | ICD-10-CM

## 2013-08-07 LAB — BASIC METABOLIC PANEL
BUN: 21 mg/dL (ref 6–23)
CO2: 30 mEq/L (ref 19–32)
Calcium: 9.3 mg/dL (ref 8.4–10.5)
Chloride: 103 mEq/L (ref 96–112)
Creatinine, Ser: 1.1 mg/dL (ref 0.4–1.5)
GFR: 71.5 mL/min (ref >60.00)
Glucose, Bld: 102 mg/dL — ABNORMAL HIGH (ref 70–99)
Potassium: 4.5 mEq/L (ref 3.5–5.1)
Sodium: 138 mEq/L (ref 135–145)

## 2013-08-07 LAB — HEMOGLOBIN A1C: Hgb A1c MFr Bld: 6.2 % (ref 4.6–6.5)

## 2013-08-07 LAB — HM DIABETES FOOT EXAM

## 2013-08-07 MED ORDER — METANX 3-90.314-2-35 MG PO CAPS: 1.0000 | ORAL_CAPSULE | Freq: Two times a day (BID) | ORAL | Status: DC

## 2013-08-07 MED ORDER — METOPROLOL SUCCINATE ER 50 MG PO TB24: ORAL_TABLET | ORAL | Status: DC

## 2013-08-07 NOTE — Patient Instructions (Signed)
Type 2 Diabetes Mellitus, Adult Type 2 diabetes mellitus, often simply referred to as type 2 diabetes, is a long-lasting (chronic) disease. In type 2 diabetes, the pancreas does not make enough insulin (a hormone), the cells are less responsive to the insulin that is made (insulin resistance), or both. Normally, insulin moves sugars from food into the tissue cells. The tissue cells use the sugars for energy. The lack of insulin or the lack of normal response to insulin causes excess sugars to build up in the blood instead of going into the tissue cells. As a result, high blood sugar (hyperglycemia) develops. The effect of high sugar (glucose) levels can cause many complications. Type 2 diabetes was also previously called adult-onset diabetes but it can occur at any age.  RISK FACTORS  A person is predisposed to developing type 2 diabetes if someone in the family has the disease and also has one or more of the following primary risk factors:  Overweight.  An inactive lifestyle.  A history of consistently eating high-calorie foods. Maintaining a normal weight and regular physical activity can reduce the chance of developing type 2 diabetes. SYMPTOMS  A person with type 2 diabetes may not show symptoms initially. The symptoms of type 2 diabetes appear slowly. The symptoms include:  Increased thirst (polydipsia).  Increased urination (polyuria).  Increased urination during the night (nocturia).  Weight loss. This weight loss may be rapid.  Frequent, recurring infections.  Tiredness (fatigue).  Weakness.  Vision changes, such as blurred vision.  Fruity smell to your breath.  Abdominal pain.  Nausea or vomiting.  Cuts or bruises which are slow to heal.  Tingling or numbness in the hands or feet. DIAGNOSIS Type 2 diabetes is frequently not diagnosed until complications of diabetes are present. Type 2 diabetes is diagnosed when symptoms or complications are present and when blood  glucose levels are increased. Your blood glucose level may be checked by one or more of the following blood tests:  A fasting blood glucose test. You will not be allowed to eat for at least 8 hours before a blood sample is taken.  A random blood glucose test. Your blood glucose is checked at any time of the day regardless of when you ate.  A hemoglobin A1c blood glucose test. A hemoglobin A1c test provides information about blood glucose control over the previous 3 months.  An oral glucose tolerance test (OGTT). Your blood glucose is measured after you have not eaten (fasted) for 2 hours and then after you drink a glucose-containing beverage. TREATMENT   You may need to take insulin or diabetes medicine daily to keep blood glucose levels in the desired range.  You will need to match insulin dosing with exercise and healthy food choices. The treatment goal is to maintain the before meal blood sugar (preprandial glucose) level at 70 130 mg/dL. HOME CARE INSTRUCTIONS   Have your hemoglobin A1c level checked twice a year.  Perform daily blood glucose monitoring as directed by your caregiver.  Monitor urine ketones when you are ill and as directed by your caregiver.  Take your diabetes medicine or insulin as directed by your caregiver to maintain your blood glucose levels in the desired range.  Never run out of diabetes medicine or insulin. It is needed every day.  Adjust insulin based on your intake of carbohydrates. Carbohydrates can raise blood glucose levels but need to be included in your diet. Carbohydrates provide vitamins, minerals, and fiber which are an essential part of   a healthy diet. Carbohydrates are found in fruits, vegetables, whole grains, dairy products, legumes, and foods containing added sugars.    Eat healthy foods. Alternate 3 meals with 3 snacks.  Lose weight if overweight.  Carry a medical alert card or wear your medical alert jewelry.  Carry a 15 gram  carbohydrate snack with you at all times to treat low blood glucose (hypoglycemia). Some examples of 15 gram carbohydrate snacks include:  Glucose tablets, 3 or 4   Glucose gel, 15 gram tube  Raisins, 2 tablespoons (24 grams)  Jelly beans, 6  Animal crackers, 8  Regular pop, 4 ounces (120 mL)  Gummy treats, 9  Recognize hypoglycemia. Hypoglycemia occurs with blood glucose levels of 70 mg/dL and below. The risk for hypoglycemia increases when fasting or skipping meals, during or after intense exercise, and during sleep. Hypoglycemia symptoms can include:  Tremors or shakes.  Decreased ability to concentrate.  Sweating.  Increased heart rate.  Headache.  Dry mouth.  Hunger.  Irritability.  Anxiety.  Restless sleep.  Altered speech or coordination.  Confusion.  Treat hypoglycemia promptly. If you are alert and able to safely swallow, follow the 15:15 rule:  Take 15 20 grams of rapid-acting glucose or carbohydrate. Rapid-acting options include glucose gel, glucose tablets, or 4 ounces (120 mL) of fruit juice, regular soda, or low fat milk.  Check your blood glucose level 15 minutes after taking the glucose.  Take 15 20 grams more of glucose if the repeat blood glucose level is still 70 mg/dL or below.  Eat a meal or snack within 1 hour once blood glucose levels return to normal.    Be alert to polyuria and polydipsia which are early signs of hyperglycemia. An early awareness of hyperglycemia allows for prompt treatment. Treat hyperglycemia as directed by your caregiver.  Engage in at least 150 minutes of moderate-intensity physical activity a week, spread over at least 3 days of the week or as directed by your caregiver. In addition, you should engage in resistance exercise at least 2 times a week or as directed by your caregiver.  Adjust your medicine and food intake as needed if you start a new exercise or sport.  Follow your sick day plan at any time you  are unable to eat or drink as usual.  Avoid tobacco use.  Limit alcohol intake to no more than 1 drink per day for nonpregnant women and 2 drinks per day for men. You should drink alcohol only when you are also eating food. Talk with your caregiver whether alcohol is safe for you. Tell your caregiver if you drink alcohol several times a week.  Follow up with your caregiver regularly.  Schedule an eye exam soon after the diagnosis of type 2 diabetes and then annually.  Perform daily skin and foot care. Examine your skin and feet daily for cuts, bruises, redness, nail problems, bleeding, blisters, or sores. A foot exam by a caregiver should be done annually.  Brush your teeth and gums at least twice a day and floss at least once a day. Follow up with your dentist regularly.  Share your diabetes management plan with your workplace or school.  Stay up-to-date with immunizations.  Learn to manage stress.  Obtain ongoing diabetes education and support as needed.  Participate in, or seek rehabilitation as needed to maintain or improve independence and quality of life. Request a physical or occupational therapy referral if you are having foot or hand numbness or difficulties with grooming,   dressing, eating, or physical activity. SEEK MEDICAL CARE IF:   You are unable to eat food or drink fluids for more than 6 hours.  You have nausea and vomiting for more than 6 hours.  Your blood glucose level is over 240 mg/dL.  There is a change in mental status.  You develop an additional serious illness.  You have diarrhea for more than 6 hours.  You have been sick or have had a fever for a couple of days and are not getting better.  You have pain during any physical activity.  SEEK IMMEDIATE MEDICAL CARE IF:  You have difficulty breathing.  You have moderate to large ketone levels. MAKE SURE YOU:  Understand these instructions.  Will watch your condition.  Will get help right away if  you are not doing well or get worse. Document Released: 11/06/2005 Document Revised: 07/31/2012 Document Reviewed: 06/04/2012 [employer] Patient Information 2014 [employer]  

## 2013-08-07 NOTE — Assessment & Plan Note (Signed)
I will check his A1C and will treat if needed 

## 2013-08-07 NOTE — Assessment & Plan Note (Signed)
I have asked him to start [facility]

## 2013-08-07 NOTE — Assessment & Plan Note (Signed)
His BP is well controlled Will check his lytes and renal function today 

## 2013-08-07 NOTE — Progress Notes (Signed)
Subjective:    Patient ID: Thomas Chung, male    DOB: 06/16/1941, 72 y.o.   MRN: [id number]  Hypertension This is a chronic problem. The current episode started more than 1 year ago. The problem is unchanged. The problem is controlled. Pertinent negatives include no anxiety, blurred vision, chest pain, headaches, malaise/fatigue, neck pain, orthopnea, palpitations, peripheral edema, PND, shortness of breath or sweats. Past treatments include beta blockers, angiotensin blockers and diuretics. The current treatment provides significant improvement. There are no compliance problems.       Review of Systems  Constitutional: Negative.  Negative for malaise/fatigue.  HENT: Negative.  Negative for neck pain.   Eyes: Negative.  Negative for blurred vision.  Respiratory: Negative.  Negative for cough, choking, chest tightness, shortness of breath and wheezing.   Cardiovascular: Negative.  Negative for chest pain, palpitations, orthopnea, leg swelling and PND.  Gastrointestinal: Negative.  Negative for abdominal pain.  Endocrine: Negative.  Negative for polydipsia, polyphagia and polyuria.  Genitourinary: Negative.   Musculoskeletal: Negative.  Negative for myalgias, back pain, joint swelling, arthralgias and gait problem.  Skin: Negative.   Allergic/Immunologic: Negative.   Neurological: Negative.  Negative for dizziness, tremors, syncope, weakness, light-headedness, numbness and headaches.       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  Negative for adenopathy. Does not bruise/bleed easily.  Psychiatric/Behavioral: Negative.        Objective:   Physical Exam  Vitals reviewed. Constitutional: He is oriented to person, place, and time. He appears well-developed and well-nourished. No distress.  HENT:  Head: Normocephalic and atraumatic.  Mouth/Throat: Oropharynx is clear  and moist. No oropharyngeal exudate.  Eyes: Conjunctivae are normal. Right eye exhibits no discharge. Left eye exhibits no discharge. No scleral icterus.  Neck: Normal range of motion. Neck supple. No JVD present. No tracheal deviation present. No thyromegaly present.  Cardiovascular: Normal rate, regular rhythm, normal heart sounds and intact distal pulses.  Exam reveals no gallop and no friction rub.   No murmur heard. Pulmonary/Chest: Effort normal and breath sounds normal. No stridor. No respiratory distress. He has no wheezes. He has no rales. He exhibits no tenderness.  Abdominal: Soft. Bowel sounds are normal. He exhibits no distension and no mass. There is no tenderness. There is no rebound and no guarding.  Musculoskeletal: Normal range of motion. He exhibits no edema and no tenderness.  Lymphadenopathy:    He has no cervical adenopathy.  Neurological: He is oriented to person, place, and time.  Skin: Skin is warm and dry. No rash noted. He is not diaphoretic. No erythema. No pallor.  Psychiatric: He has a normal mood and affect. His behavior is normal. Judgment and thought content normal.     Lab Results  Component Value Date   WBC 6.5 11/28/2011   HGB 15.1 11/28/2011   HCT 43.8 11/28/2011   PLT 198.0 11/28/2011   GLUCOSE 96 04/07/2013   CHOL 161 04/07/2013   TRIG 151.0* 04/07/2013   HDL 43.60 04/07/2013   LDLCALC 87 04/07/2013   ALT 26 04/07/2013   AST 23 04/07/2013   NA 141 04/07/2013   K 3.8 04/07/2013   CL 103 04/07/2013   CREATININE 1.0 04/07/2013   BUN 16 04/07/2013   CO2 28 04/07/2013   TSH 0.86 04/21/2011   PSA 0.07* 08/12/2012   HGBA1C 6.0 04/07/2013  Assessment & Plan:

## 2013-08-14 DIAGNOSIS — H26499 Other secondary cataract, unspecified eye: Secondary | ICD-10-CM | POA: Diagnosis not present

## 2013-08-14 DIAGNOSIS — E119 Type 2 diabetes mellitus without complications: Secondary | ICD-10-CM | POA: Diagnosis not present

## 2013-08-14 LAB — HM DIABETES EYE EXAM

## 2013-08-15 ENCOUNTER — Encounter: Payer: Self-pay | Admitting: Internal Medicine

## 2013-09-12 DIAGNOSIS — L570 Actinic keratosis: Secondary | ICD-10-CM | POA: Diagnosis not present

## 2013-09-12 DIAGNOSIS — Z85828 Personal history of other malignant neoplasm of skin: Secondary | ICD-10-CM | POA: Diagnosis not present

## 2013-09-12 DIAGNOSIS — L821 Other seborrheic keratosis: Secondary | ICD-10-CM | POA: Diagnosis not present

## 2013-09-12 DIAGNOSIS — L819 Disorder of pigmentation, unspecified: Secondary | ICD-10-CM | POA: Diagnosis not present

## 2013-09-12 DIAGNOSIS — D239 Other benign neoplasm of skin, unspecified: Secondary | ICD-10-CM | POA: Diagnosis not present

## 2013-09-15 DIAGNOSIS — H26499 Other secondary cataract, unspecified eye: Secondary | ICD-10-CM | POA: Diagnosis not present

## 2013-09-16 DIAGNOSIS — E1159 Type 2 diabetes mellitus with other circulatory complications: Secondary | ICD-10-CM | POA: Diagnosis not present

## 2013-09-16 DIAGNOSIS — I739 Peripheral vascular disease, unspecified: Secondary | ICD-10-CM | POA: Diagnosis not present

## 2013-09-16 DIAGNOSIS — IMO0002 Reserved for concepts with insufficient information to code with codable children: Secondary | ICD-10-CM | POA: Diagnosis not present

## 2013-09-16 DIAGNOSIS — L608 Other nail disorders: Secondary | ICD-10-CM | POA: Diagnosis not present

## 2013-09-25 ENCOUNTER — Other Ambulatory Visit: Payer: Self-pay

## 2013-10-13 ENCOUNTER — Other Ambulatory Visit: Payer: Self-pay | Admitting: Internal Medicine

## 2013-10-15 ENCOUNTER — Other Ambulatory Visit: Payer: Self-pay | Admitting: *Deleted

## 2013-10-15 MED ORDER — GLUCOSE BLOOD VI STRP: ORAL_STRIP | Status: DC

## 2013-12-10 ENCOUNTER — Encounter: Payer: Self-pay | Admitting: Internal Medicine

## 2013-12-10 ENCOUNTER — Ambulatory Visit (INDEPENDENT_AMBULATORY_CARE_PROVIDER_SITE_OTHER): Payer: Medicare Other | Admitting: Internal Medicine

## 2013-12-10 ENCOUNTER — Other Ambulatory Visit (INDEPENDENT_AMBULATORY_CARE_PROVIDER_SITE_OTHER): Payer: Medicare Other

## 2013-12-10 VITALS — BP 120/76 | HR 50 | Temp 97.5°F | Resp 16 | Ht 76.0 in | Wt 246.0 lb

## 2013-12-10 DIAGNOSIS — N138 Other obstructive and reflux uropathy: Secondary | ICD-10-CM

## 2013-12-10 DIAGNOSIS — E1149 Type 2 diabetes mellitus with other diabetic neurological complication: Secondary | ICD-10-CM | POA: Diagnosis not present

## 2013-12-10 DIAGNOSIS — E785 Hyperlipidemia, unspecified: Secondary | ICD-10-CM

## 2013-12-10 DIAGNOSIS — I10 Essential (primary) hypertension: Secondary | ICD-10-CM | POA: Diagnosis not present

## 2013-12-10 DIAGNOSIS — Z0000 Encounter for general adult medical examination without abnormal findings: Secondary | ICD-10-CM

## 2013-12-10 DIAGNOSIS — N403 Nodular prostate with lower urinary tract symptoms: Secondary | ICD-10-CM

## 2013-12-10 LAB — URINALYSIS, ROUTINE W REFLEX MICROSCOPIC
Bilirubin Urine: NEGATIVE
Hgb urine dipstick: NEGATIVE
Ketones, ur: NEGATIVE
Leukocytes, UA: NEGATIVE
Nitrite: NEGATIVE
RBC / HPF: NONE SEEN (ref 0–?)
Specific Gravity, Urine: 1.015 (ref 1.000–1.030)
Total Protein, Urine: NEGATIVE
Urine Glucose: NEGATIVE
Urobilinogen, UA: 0.2 (ref 0.0–1.0)
pH: 6 (ref 5.0–8.0)

## 2013-12-10 LAB — CBC WITH DIFFERENTIAL/PLATELET
Basophils Absolute: 0 10*3/uL (ref 0.0–0.1)
Basophils Relative: 0.3 % (ref 0.0–3.0)
Eosinophils Absolute: 0.2 10*3/uL (ref 0.0–0.7)
Eosinophils Relative: 3.2 % (ref 0.0–5.0)
HCT: 44.7 % (ref 39.0–52.0)
Hemoglobin: 15.4 g/dL (ref 13.0–17.0)
Lymphocytes Relative: 17.6 % (ref 12.0–46.0)
Lymphs Abs: 1.3 10*3/uL (ref 0.7–4.0)
MCHC: 34.4 g/dL (ref 30.0–36.0)
MCV: 89.2 fl (ref 78.0–100.0)
Monocytes Absolute: 0.7 10*3/uL (ref 0.1–1.0)
Monocytes Relative: 10.1 % (ref 3.0–12.0)
Neutro Abs: 4.9 10*3/uL (ref 1.4–7.7)
Neutrophils Relative %: 68.8 % (ref 43.0–77.0)
Platelets: 246 10*3/uL (ref 150.0–400.0)
RBC: 5.01 Mil/uL (ref 4.22–5.81)
RDW: 14.3 % (ref 11.5–14.6)
WBC: 7.2 10*3/uL (ref 4.5–10.5)

## 2013-12-10 LAB — COMPREHENSIVE METABOLIC PANEL
ALT: 28 U/L (ref 0–53)
AST: 26 U/L (ref 0–37)
Albumin: 4.2 g/dL (ref 3.5–5.2)
Alkaline Phosphatase: 54 U/L (ref 39–117)
BUN: 21 mg/dL (ref 6–23)
CO2: 26 mEq/L (ref 19–32)
Calcium: 9.4 mg/dL (ref 8.4–10.5)
Chloride: 106 mEq/L (ref 96–112)
Creatinine, Ser: 1 mg/dL (ref 0.4–1.5)
GFR: 74.61 mL/min (ref >60.00)
Glucose, Bld: 93 mg/dL (ref 70–99)
Potassium: 4 mEq/L (ref 3.5–5.1)
Sodium: 140 mEq/L (ref 135–145)
Total Bilirubin: 0.9 mg/dL (ref 0.3–1.2)
Total Protein: 7 g/dL (ref 6.0–8.3)

## 2013-12-10 LAB — MICROALBUMIN / CREATININE URINE RATIO
Creatinine,U: 63 mg/dL
Microalb Creat Ratio: 0.5 mg/g (ref 0.0–30.0)
Microalb, Ur: 0.3 mg/dL (ref 0.0–1.9)

## 2013-12-10 LAB — LIPID PANEL
Cholesterol: 127 mg/dL (ref 0–200)
HDL: 46.9 mg/dL (ref >39.00)
LDL Cholesterol: 66 mg/dL (ref 0–99)
Total CHOL/HDL Ratio: 3
Triglycerides: 72 mg/dL (ref 0.0–149.0)
VLDL: 14.4 mg/dL (ref 0.0–40.0)

## 2013-12-10 LAB — PSA: PSA: 0.11 ng/mL (ref 0.10–4.00)

## 2013-12-10 LAB — TSH: TSH: 1.25 u[IU]/mL (ref 0.35–5.50)

## 2013-12-10 LAB — FECAL OCCULT BLOOD, GUAIAC: Fecal Occult Blood: NEGATIVE

## 2013-12-10 LAB — HEMOGLOBIN A1C: Hgb A1c MFr Bld: 6 % (ref 4.6–6.5)

## 2013-12-10 MED ORDER — GLUCOSE BLOOD VI STRP: ORAL_STRIP | Status: DC

## 2013-12-10 NOTE — Assessment & Plan Note (Signed)
I will check his A1C and will screen him for albuminuria

## 2013-12-10 NOTE — Progress Notes (Signed)
Subjective:    Patient ID: Thomas Chung, male    DOB: May 04, 1941, 73 y.o.   MRN: [id number]  Diabetes He presents for his follow-up diabetic visit. He has type 2 diabetes mellitus. His disease course has been stable. There are no hypoglycemic associated symptoms. Pertinent negatives for hypoglycemia include no dizziness. Associated symptoms include foot paresthesias. Pertinent negatives for diabetes include no chest pain, no fatigue, no foot ulcerations, no polydipsia, no polyphagia, no polyuria, no visual change, no weakness and no weight loss. There are no hypoglycemic complications. Symptoms are stable. Diabetic complications include peripheral neuropathy. Current diabetic treatment includes diet. He is compliant with treatment all of the time. His weight is stable. He is following a generally healthy diet. He has not had a previous visit with a dietician. He participates in exercise every other day. There is no change in his home blood glucose trend. His breakfast blood glucose range is generally 90-110 mg/dl. An ACE inhibitor/angiotensin II receptor blocker is being taken. He sees a podiatrist.Eye exam is current.      Review of Systems  Constitutional: Negative.  Negative for fever, chills, weight loss, diaphoresis, appetite change and fatigue.  HENT: Negative.   Eyes: Negative.   Respiratory: Negative.  Negative for cough, choking, chest tightness, shortness of breath, wheezing and stridor.   Cardiovascular: Negative.  Negative for chest pain, palpitations and leg swelling.  Gastrointestinal: Negative.  Negative for abdominal pain.  Endocrine: Negative.  Negative for polydipsia, polyphagia and polyuria.  Genitourinary: Negative.   Musculoskeletal: Negative.  Negative for arthralgias, back pain, gait problem, joint swelling, myalgias, neck pain and neck stiffness.  Skin: Negative.   Allergic/Immunologic: Negative.   Neurological: Negative.  Negative for dizziness and weakness.    Hematological: Negative.  Negative for adenopathy. Does not bruise/bleed easily.  Psychiatric/Behavioral: Negative.        Objective:   Physical Exam  Vitals reviewed. Constitutional: He is oriented to person, place, and time. He appears well-developed and well-nourished. No distress.  HENT:  Head: Normocephalic and atraumatic.  Mouth/Throat: Oropharynx is clear and moist. No oropharyngeal exudate.  Eyes: Conjunctivae are normal. Right eye exhibits no discharge. Left eye exhibits no discharge. No scleral icterus.  Neck: Normal range of motion. Neck supple. No JVD present. No tracheal deviation present. No thyromegaly present.  Cardiovascular: Normal rate, regular rhythm, normal heart sounds and intact distal pulses.  Exam reveals no gallop and no friction rub.   No murmur heard. Pulmonary/Chest: Effort normal and breath sounds normal. No stridor. No respiratory distress. He has no wheezes. He has no rales. He exhibits no tenderness.  Abdominal: Soft. Bowel sounds are normal. He exhibits no distension and no mass. There is no tenderness. There is no rebound and no guarding. Hernia confirmed negative in the right inguinal area and confirmed negative in the left inguinal area.  Genitourinary: Rectum normal, prostate normal, testes normal and penis normal. Rectal exam shows no external hemorrhoid, no internal hemorrhoid, no fissure, no mass, no tenderness and anal tone normal. Guaiac negative stool. Prostate is not enlarged and not tender. Right testis shows no mass, no swelling and no tenderness. Right testis is descended. Left testis shows no mass, no swelling and no tenderness. Left testis is descended. Circumcised. No penile erythema or penile tenderness. No discharge found.  Musculoskeletal: Normal range of motion. He exhibits no edema and no tenderness.  Lymphadenopathy:    He has no cervical adenopathy.       Right: No inguinal adenopathy  present.       Left: No inguinal adenopathy  present.  Neurological: He is oriented to person, place, and time.  Skin: Skin is warm and dry. No rash noted. He is not diaphoretic. No erythema. No pallor.  Psychiatric: He has a normal mood and affect. His behavior is normal. Judgment and thought content normal.     Lab Results  Component Value Date   WBC 6.5 11/28/2011   HGB 15.1 11/28/2011   HCT 43.8 11/28/2011   PLT 198.0 11/28/2011   GLUCOSE 102* 08/07/2013   CHOL 161 04/07/2013   TRIG 151.0* 04/07/2013   HDL 43.60 04/07/2013   LDLCALC 87 04/07/2013   ALT 26 04/07/2013   AST 23 04/07/2013   NA 138 08/07/2013   K 4.5 08/07/2013   CL 103 08/07/2013   CREATININE 1.1 08/07/2013   BUN 21 08/07/2013   CO2 30 08/07/2013   TSH 0.86 04/21/2011   PSA 0.07* 08/12/2012   HGBA1C 6.2 08/07/2013       Assessment & Plan:

## 2013-12-10 NOTE — Assessment & Plan Note (Signed)
I will check his FLP today and will address if needed

## 2013-12-10 NOTE — Progress Notes (Signed)
Pre visit review using our clinic review tool, if applicable. No additional management support is needed unless otherwise documented below in the visit note. 

## 2013-12-10 NOTE — Patient Instructions (Signed)
Health Maintenance, Males A healthy lifestyle and preventative care can promote health and wellness.  Maintain regular health, dental, and eye exams.  Eat a healthy diet. Foods like vegetables, fruits, whole grains, low-fat dairy products, and lean protein foods contain the nutrients you need and are low in calories. Decrease your intake of foods high in solid fats, added sugars, and salt. Get information about a proper diet from your health care provider, if necessary.  Regular physical exercise is one of the most important things you can do for your health. Most adults should get at least 150 minutes of moderate-intensity exercise (any activity that increases your heart rate and causes you to sweat) each week. In addition, most adults need muscle-strengthening exercises on 2 or more days a week.   Maintain a healthy weight. The body mass index (BMI) is a screening tool to identify possible weight problems. It provides an estimate of body fat based on height and weight. Your health care provider can find your BMI and can help you achieve or maintain a healthy weight. For males 20 years and older:  A BMI below 18.5 is considered underweight.  A BMI of 18.5 to 24.9 is normal.  A BMI of 25 to 29.9 is considered overweight.  A BMI of 30 and above is considered obese.  Maintain normal blood lipids and cholesterol by exercising and minimizing your intake of saturated fat. Eat a balanced diet with plenty of fruits and vegetables. Blood tests for lipids and cholesterol should begin at age 20 and be repeated every 5 years. If your lipid or cholesterol levels are high, you are over 50, or you are at high risk for heart disease, you may need your cholesterol levels checked more frequently.Ongoing high lipid and cholesterol levels should be treated with medicines, if diet and exercise are not working.  If you smoke, find out from your health care provider how to quit. If you do not use tobacco, do not  start.  Lung cancer screening is recommended for adults aged 55 80 years who are at high risk for developing lung cancer because of a history of smoking. A yearly low-dose CT scan of the lungs is recommended for people who have at least a 30-pack-year history of smoking and are a current smoker or have quit within the past 15 years. A pack year of smoking is smoking an average of 1 pack of cigarettes a day for 1 year (for example, a 30-pack-year history of smoking could mean smoking 1 pack a day for 30 years or 2 packs a day for 15 years). Yearly screening should continue until the smoker has stopped smoking for at least 15 years. Yearly screening should be stopped for people who develop a health problem that would prevent them from having lung cancer treatment.  If you choose to drink alcohol, do not have more than 2 drinks per day. One drink is considered to be 12 oz (360 mL) of beer, 5 oz (150 mL) of wine, or 1.5 oz (45 mL) of liquor.  Avoid use of street drugs. Do not share needles with anyone. Ask for help if you need support or instructions about stopping the use of drugs.  High blood pressure causes heart disease and increases the risk of stroke. Blood pressure should be checked at least every 1 2 years. Ongoing high blood pressure should be treated with medicines if weight loss and exercise are not effective.  If you are 45 72 years old, ask your health   care provider if you should take aspirin to prevent heart disease.  Diabetes screening involves taking a blood sample to check your fasting blood sugar level. This should be done once every 3 years after age 45, if you are at a normal weight and without risk factors for diabetes. Testing should be considered at a younger age or be carried out more frequently if you are overweight and have at least 1 risk factor for diabetes.  Colorectal cancer can be detected and often prevented. Most routine colorectal cancer screening begins at the age of 50  and continues through age 75. However, your health care provider may recommend screening at an earlier age if you have risk factors for colon cancer. On a yearly basis, your health care provider may provide home test kits to check for hidden blood in the stool. A small camera at the end of a tube may be used to directly examine the colon (sigmoidoscopy or colonoscopy) to detect the earliest forms of colorectal cancer. Talk to your health care provider about this at age 50, when routine screening begins. A direct exam of the colon should be repeated every 5 10 years through age 75, unless early forms of pre-cancerous polyps or small growths are found.  People who are at an increased risk for hepatitis B should be screened for this virus. You are considered at high risk for hepatitis B if:  You were born in a country where hepatitis B occurs often. Talk with your health care provider about which countries are considered high-risk.  Your parents were born in a high-risk country and you have not received a shot to protect against hepatitis B (hepatitis B vaccine).  You have HIV or AIDS.  You use needles to inject street drugs.  You live with, or have sex with, someone who has hepatitis B.  You are a man who has sex with other men (MSM).  You get hemodialysis treatment.  You take certain medicines for conditions like cancer, organ transplantation, and autoimmune conditions.  Hepatitis C blood testing is recommended for all people born from 1945 through 1965 and any individual with known risk factors for hepatitis C.  Healthy men should no longer receive prostate-specific antigen (PSA) blood tests as part of routine cancer screening. Talk to your health care provider about prostate cancer screening.  Testicular cancer screening is not recommended for adolescents or adult males who have no symptoms. Screening includes self-exam, a health care provider exam, and other screening tests. Consult with  your health care provider about any symptoms you have or any concerns you have about testicular cancer.  Practice safe sex. Use condoms and avoid high-risk sexual practices to reduce the spread of sexually transmitted infections (STIs).  Use sunscreen. Apply sunscreen liberally and repeatedly throughout the day. You should seek shade when your shadow is shorter than you. Protect yourself by wearing long sleeves, pants, a wide-brimmed hat, and sunglasses year round, whenever you are outdoors.  Tell your health care provider of new moles or changes in moles, especially if there is a change in shape or color. Also tell your provider if a mole is larger than the size of a pencil eraser.  A one-time screening for abdominal aortic aneurysm (AAA) and surgical repair of large AAAs by ultrasound is recommended for men aged 65 75 years who are current or former smokers.  Stay current with your vaccines (immunizations). Document Released: 05/04/2008 Document Revised: 08/27/2013 Document Reviewed: 04/03/2011 [employer] Patient Information 2014 [employer].   Type 2 Diabetes Mellitus, Adult Type 2 diabetes mellitus, often simply referred to as type 2 diabetes, is a long-lasting (chronic) disease. In type 2 diabetes, the pancreas does not make enough insulin (a hormone), the cells are less responsive to the insulin that is made (insulin resistance), or both. Normally, insulin moves sugars from food into the tissue cells. The tissue cells use the sugars for energy. The lack of insulin or the lack of normal response to insulin causes excess sugars to build up in the blood instead of going into the tissue cells. As a result, high blood sugar (hyperglycemia) develops. The effect of high sugar (glucose) levels can cause many complications. Type 2 diabetes was also previously called adult-onset diabetes but it can occur at any age.  RISK FACTORS  A person is predisposed to developing type 2 diabetes if someone in  the family has the disease and also has one or more of the following primary risk factors:  Overweight.  An inactive lifestyle.  A history of consistently eating high-calorie foods. Maintaining a normal weight and regular physical activity can reduce the chance of developing type 2 diabetes. SYMPTOMS  A person with type 2 diabetes may not show symptoms initially. The symptoms of type 2 diabetes appear slowly. The symptoms include:  Increased thirst (polydipsia).  Increased urination (polyuria).  Increased urination during the night (nocturia).  Weight loss. This weight loss may be rapid.  Frequent, recurring infections.  Tiredness (fatigue).  Weakness.  Vision changes, such as blurred vision.  Fruity smell to your breath.  Abdominal pain.  Nausea or vomiting.  Cuts or bruises which are slow to heal.  Tingling or numbness in the hands or feet. DIAGNOSIS Type 2 diabetes is frequently not diagnosed until complications of diabetes are present. Type 2 diabetes is diagnosed when symptoms or complications are present and when blood glucose levels are increased. Your blood glucose level may be checked by one or more of the following blood tests:  A fasting blood glucose test. You will not be allowed to eat for at least 8 hours before a blood sample is taken.  A random blood glucose test. Your blood glucose is checked at any time of the day regardless of when you ate.  A hemoglobin A1c blood glucose test. A hemoglobin A1c test provides information about blood glucose control over the previous 3 months.  An oral glucose tolerance test (OGTT). Your blood glucose is measured after you have not eaten (fasted) for 2 hours and then after you drink a glucose-containing beverage. TREATMENT   You may need to take insulin or diabetes medicine daily to keep blood glucose levels in the desired range.  You will need to match insulin dosing with exercise and healthy food choices. The  treatment goal is to maintain the before meal blood sugar (preprandial glucose) level at 70 130 mg/dL. HOME CARE INSTRUCTIONS   Have your hemoglobin A1c level checked twice a year.  Perform daily blood glucose monitoring as directed by your caregiver.  Monitor urine ketones when you are ill and as directed by your caregiver.  Take your diabetes medicine or insulin as directed by your caregiver to maintain your blood glucose levels in the desired range.  Never run out of diabetes medicine or insulin. It is needed every day.  Adjust insulin based on your intake of carbohydrates. Carbohydrates can raise blood glucose levels but need to be included in your diet. Carbohydrates provide vitamins, minerals, and fiber which are an essential part   of a healthy diet. Carbohydrates are found in fruits, vegetables, whole grains, dairy products, legumes, and foods containing added sugars.    Eat healthy foods. Alternate 3 meals with 3 snacks.  Lose weight if overweight.  Carry a medical alert card or wear your medical alert jewelry.  Carry a 15 gram carbohydrate snack with you at all times to treat low blood glucose (hypoglycemia). Some examples of 15 gram carbohydrate snacks include:  Glucose tablets, 3 or 4   Glucose gel, 15 gram tube  Raisins, 2 tablespoons (24 grams)  Jelly beans, 6  Animal crackers, 8  Regular pop, 4 ounces (120 mL)  Gummy treats, 9  Recognize hypoglycemia. Hypoglycemia occurs with blood glucose levels of 70 mg/dL and below. The risk for hypoglycemia increases when fasting or skipping meals, during or after intense exercise, and during sleep. Hypoglycemia symptoms can include:  Tremors or shakes.  Decreased ability to concentrate.  Sweating.  Increased heart rate.  Headache.  Dry mouth.  Hunger.  Irritability.  Anxiety.  Restless sleep.  Altered speech or coordination.  Confusion.  Treat hypoglycemia promptly. If you are alert and able to  safely swallow, follow the 15:15 rule:  Take 15 20 grams of rapid-acting glucose or carbohydrate. Rapid-acting options include glucose gel, glucose tablets, or 4 ounces (120 mL) of fruit juice, regular soda, or low fat milk.  Check your blood glucose level 15 minutes after taking the glucose.  Take 15 20 grams more of glucose if the repeat blood glucose level is still 70 mg/dL or below.  Eat a meal or snack within 1 hour once blood glucose levels return to normal.    Be alert to polyuria and polydipsia which are early signs of hyperglycemia. An early awareness of hyperglycemia allows for prompt treatment. Treat hyperglycemia as directed by your caregiver.  Engage in at least 150 minutes of moderate-intensity physical activity a week, spread over at least 3 days of the week or as directed by your caregiver. In addition, you should engage in resistance exercise at least 2 times a week or as directed by your caregiver.  Adjust your medicine and food intake as needed if you start a new exercise or sport.  Follow your sick day plan at any time you are unable to eat or drink as usual.  Avoid tobacco use.  Limit alcohol intake to no more than 1 drink per day for nonpregnant women and 2 drinks per day for men. You should drink alcohol only when you are also eating food. Talk with your caregiver whether alcohol is safe for you. Tell your caregiver if you drink alcohol several times a week.  Follow up with your caregiver regularly.  Schedule an eye exam soon after the diagnosis of type 2 diabetes and then annually.  Perform daily skin and foot care. Examine your skin and feet daily for cuts, bruises, redness, nail problems, bleeding, blisters, or sores. A foot exam by a caregiver should be done annually.  Brush your teeth and gums at least twice a day and floss at least once a day. Follow up with your dentist regularly.  Share your diabetes management plan with your workplace or  school.  Stay up-to-date with immunizations.  Learn to manage stress.  Obtain ongoing diabetes education and support as needed.  Participate in, or seek rehabilitation as needed to maintain or improve independence and quality of life. Request a physical or occupational therapy referral if you are having foot or hand numbness or difficulties with   grooming, dressing, eating, or physical activity. SEEK MEDICAL CARE IF:   You are unable to eat food or drink fluids for more than 6 hours.  You have nausea and vomiting for more than 6 hours.  Your blood glucose level is over 240 mg/dL.  There is a change in mental status.  You develop an additional serious illness.  You have diarrhea for more than 6 hours.  You have been sick or have had a fever for a couple of days and are not getting better.  You have pain during any physical activity.  SEEK IMMEDIATE MEDICAL CARE IF:  You have difficulty breathing.  You have moderate to large ketone levels. MAKE SURE YOU:  Understand these instructions.  Will watch your condition.  Will get help right away if you are not doing well or get worse. Document Released: 11/06/2005 Document Revised: 07/31/2012 Document Reviewed: 06/04/2012 [employer] Patient Information 2014 [employer].  

## 2013-12-10 NOTE — Assessment & Plan Note (Signed)
PSA today

## 2013-12-10 NOTE — Assessment & Plan Note (Signed)
His BP is well controlled, today I will check his lytes and renal function 

## 2013-12-10 NOTE — Assessment & Plan Note (Signed)

## 2013-12-18 DIAGNOSIS — L608 Other nail disorders: Secondary | ICD-10-CM | POA: Diagnosis not present

## 2013-12-18 DIAGNOSIS — E1159 Type 2 diabetes mellitus with other circulatory complications: Secondary | ICD-10-CM | POA: Diagnosis not present

## 2013-12-18 DIAGNOSIS — I739 Peripheral vascular disease, unspecified: Secondary | ICD-10-CM | POA: Diagnosis not present

## 2013-12-18 DIAGNOSIS — L84 Corns and callosities: Secondary | ICD-10-CM | POA: Diagnosis not present

## 2014-01-23 DIAGNOSIS — M79609 Pain in unspecified limb: Secondary | ICD-10-CM | POA: Diagnosis not present

## 2014-01-23 DIAGNOSIS — B351 Tinea unguium: Secondary | ICD-10-CM | POA: Diagnosis not present

## 2014-03-12 DIAGNOSIS — L84 Corns and callosities: Secondary | ICD-10-CM | POA: Diagnosis not present

## 2014-03-12 DIAGNOSIS — L608 Other nail disorders: Secondary | ICD-10-CM | POA: Diagnosis not present

## 2014-03-12 DIAGNOSIS — I739 Peripheral vascular disease, unspecified: Secondary | ICD-10-CM | POA: Diagnosis not present

## 2014-03-12 DIAGNOSIS — E1159 Type 2 diabetes mellitus with other circulatory complications: Secondary | ICD-10-CM | POA: Diagnosis not present

## 2014-03-22 ENCOUNTER — Other Ambulatory Visit: Payer: Self-pay | Admitting: Internal Medicine

## 2014-04-09 ENCOUNTER — Encounter: Payer: Self-pay | Admitting: Internal Medicine

## 2014-04-09 ENCOUNTER — Ambulatory Visit (INDEPENDENT_AMBULATORY_CARE_PROVIDER_SITE_OTHER): Payer: Medicare Other | Admitting: Internal Medicine

## 2014-04-09 ENCOUNTER — Other Ambulatory Visit (INDEPENDENT_AMBULATORY_CARE_PROVIDER_SITE_OTHER): Payer: Medicare Other

## 2014-04-09 VITALS — BP 130/70 | HR 64 | Temp 98.6°F | Resp 16 | Ht 76.0 in | Wt 247.8 lb

## 2014-04-09 DIAGNOSIS — I10 Essential (primary) hypertension: Secondary | ICD-10-CM | POA: Diagnosis not present

## 2014-04-09 DIAGNOSIS — I2510 Atherosclerotic heart disease of native coronary artery without angina pectoris: Secondary | ICD-10-CM | POA: Diagnosis not present

## 2014-04-09 DIAGNOSIS — Z23 Encounter for immunization: Secondary | ICD-10-CM

## 2014-04-09 DIAGNOSIS — E1149 Type 2 diabetes mellitus with other diabetic neurological complication: Secondary | ICD-10-CM

## 2014-04-09 LAB — BASIC METABOLIC PANEL
BUN: 20 mg/dL (ref 6–23)
CO2: 28 mEq/L (ref 19–32)
Calcium: 9.5 mg/dL (ref 8.4–10.5)
Chloride: 104 mEq/L (ref 96–112)
Creatinine, Ser: 1.2 mg/dL (ref 0.4–1.5)
GFR: 66.38 mL/min (ref >60.00)
Glucose, Bld: 96 mg/dL (ref 70–99)
Potassium: 4.1 mEq/L (ref 3.5–5.1)
Sodium: 140 mEq/L (ref 135–145)

## 2014-04-09 LAB — HEMOGLOBIN A1C: Hgb A1c MFr Bld: 6 % (ref 4.6–6.5)

## 2014-04-09 MED ORDER — PNEUMOCOCCAL 13-VAL CONJ VACC IM SUSP: 0.5000 mL | INTRAMUSCULAR | Status: DC

## 2014-04-09 NOTE — Patient Instructions (Signed)
Type 2 Diabetes Mellitus, Adult Type 2 diabetes mellitus, often simply referred to as type 2 diabetes, is a long-lasting (chronic) disease. In type 2 diabetes, the pancreas does not make enough insulin (a hormone), the cells are less responsive to the insulin that is made (insulin resistance), or both. Normally, insulin moves sugars from food into the tissue cells. The tissue cells use the sugars for energy. The lack of insulin or the lack of normal response to insulin causes excess sugars to build up in the blood instead of going into the tissue cells. As a result, high blood sugar (hyperglycemia) develops. The effect of high sugar (glucose) levels can cause many complications. Type 2 diabetes was also previously called adult-onset diabetes but it can occur at any age.  RISK FACTORS  A person is predisposed to developing type 2 diabetes if someone in the family has the disease and also has one or more of the following primary risk factors:  Overweight.  An inactive lifestyle.  A history of consistently eating high-calorie foods. Maintaining a normal weight and regular physical activity can reduce the chance of developing type 2 diabetes. SYMPTOMS  A person with type 2 diabetes may not show symptoms initially. The symptoms of type 2 diabetes appear slowly. The symptoms include:  Increased thirst (polydipsia).  Increased urination (polyuria).  Increased urination during the night (nocturia).  Weight loss. This weight loss may be rapid.  Frequent, recurring infections.  Tiredness (fatigue).  Weakness.  Vision changes, such as blurred vision.  Fruity smell to your breath.  Abdominal pain.  Nausea or vomiting.  Cuts or bruises which are slow to heal.  Tingling or numbness in the hands or feet. DIAGNOSIS Type 2 diabetes is frequently not diagnosed until complications of diabetes are present. Type 2 diabetes is diagnosed when symptoms or complications are present and when blood  glucose levels are increased. Your blood glucose level may be checked by one or more of the following blood tests:  A fasting blood glucose test. You will not be allowed to eat for at least 8 hours before a blood sample is taken.  A random blood glucose test. Your blood glucose is checked at any time of the day regardless of when you ate.  A hemoglobin A1c blood glucose test. A hemoglobin A1c test provides information about blood glucose control over the previous 3 months.  An oral glucose tolerance test (OGTT). Your blood glucose is measured after you have not eaten (fasted) for 2 hours and then after you drink a glucose-containing beverage. TREATMENT   You may need to take insulin or diabetes medicine daily to keep blood glucose levels in the desired range.  You will need to match insulin dosing with exercise and healthy food choices. The treatment goal is to maintain the before meal blood sugar (preprandial glucose) level at 70 130 mg/dL. HOME CARE INSTRUCTIONS   Have your hemoglobin A1c level checked twice a year.  Perform daily blood glucose monitoring as directed by your caregiver.  Monitor urine ketones when you are ill and as directed by your caregiver.  Take your diabetes medicine or insulin as directed by your caregiver to maintain your blood glucose levels in the desired range.  Never run out of diabetes medicine or insulin. It is needed every day.  Adjust insulin based on your intake of carbohydrates. Carbohydrates can raise blood glucose levels but need to be included in your diet. Carbohydrates provide vitamins, minerals, and fiber which are an essential part of   a healthy diet. Carbohydrates are found in fruits, vegetables, whole grains, dairy products, legumes, and foods containing added sugars.    Eat healthy foods. Alternate 3 meals with 3 snacks.  Lose weight if overweight.  Carry a medical alert card or wear your medical alert jewelry.  Carry a 15 gram  carbohydrate snack with you at all times to treat low blood glucose (hypoglycemia). Some examples of 15 gram carbohydrate snacks include:  Glucose tablets, 3 or 4   Glucose gel, 15 gram tube  Raisins, 2 tablespoons (24 grams)  Jelly beans, 6  Animal crackers, 8  Regular pop, 4 ounces (120 mL)  Gummy treats, 9  Recognize hypoglycemia. Hypoglycemia occurs with blood glucose levels of 70 mg/dL and below. The risk for hypoglycemia increases when fasting or skipping meals, during or after intense exercise, and during sleep. Hypoglycemia symptoms can include:  Tremors or shakes.  Decreased ability to concentrate.  Sweating.  Increased heart rate.  Headache.  Dry mouth.  Hunger.  Irritability.  Anxiety.  Restless sleep.  Altered speech or coordination.  Confusion.  Treat hypoglycemia promptly. If you are alert and able to safely swallow, follow the 15:15 rule:  Take 15 20 grams of rapid-acting glucose or carbohydrate. Rapid-acting options include glucose gel, glucose tablets, or 4 ounces (120 mL) of fruit juice, regular soda, or low fat milk.  Check your blood glucose level 15 minutes after taking the glucose.  Take 15 20 grams more of glucose if the repeat blood glucose level is still 70 mg/dL or below.  Eat a meal or snack within 1 hour once blood glucose levels return to normal.    Be alert to polyuria and polydipsia which are early signs of hyperglycemia. An early awareness of hyperglycemia allows for prompt treatment. Treat hyperglycemia as directed by your caregiver.  Engage in at least 150 minutes of moderate-intensity physical activity a week, spread over at least 3 days of the week or as directed by your caregiver. In addition, you should engage in resistance exercise at least 2 times a week or as directed by your caregiver.  Adjust your medicine and food intake as needed if you start a new exercise or sport.  Follow your sick day plan at any time you  are unable to eat or drink as usual.  Avoid tobacco use.  Limit alcohol intake to no more than 1 drink per day for nonpregnant women and 2 drinks per day for men. You should drink alcohol only when you are also eating food. Talk with your caregiver whether alcohol is safe for you. Tell your caregiver if you drink alcohol several times a week.  Follow up with your caregiver regularly.  Schedule an eye exam soon after the diagnosis of type 2 diabetes and then annually.  Perform daily skin and foot care. Examine your skin and feet daily for cuts, bruises, redness, nail problems, bleeding, blisters, or sores. A foot exam by a caregiver should be done annually.  Brush your teeth and gums at least twice a day and floss at least once a day. Follow up with your dentist regularly.  Share your diabetes management plan with your workplace or school.  Stay up-to-date with immunizations.  Learn to manage stress.  Obtain ongoing diabetes education and support as needed.  Participate in, or seek rehabilitation as needed to maintain or improve independence and quality of life. Request a physical or occupational therapy referral if you are having foot or hand numbness or difficulties with grooming,   dressing, eating, or physical activity. SEEK MEDICAL CARE IF:   You are unable to eat food or drink fluids for more than 6 hours.  You have nausea and vomiting for more than 6 hours.  Your blood glucose level is over 240 mg/dL.  There is a change in mental status.  You develop an additional serious illness.  You have diarrhea for more than 6 hours.  You have been sick or have had a fever for a couple of days and are not getting better.  You have pain during any physical activity.  SEEK IMMEDIATE MEDICAL CARE IF:  You have difficulty breathing.  You have moderate to large ketone levels. MAKE SURE YOU:  Understand these instructions.  Will watch your condition.  Will get help right away if  you are not doing well or get worse. Document Released: 11/06/2005 Document Revised: 07/31/2012 Document Reviewed: 06/04/2012 [employer] Patient Information 2014 [employer]  

## 2014-04-09 NOTE — Assessment & Plan Note (Signed)
His BP is well controlled 

## 2014-04-09 NOTE — Progress Notes (Signed)
Pre visit review using our clinic review tool, if applicable. No additional management support is needed unless otherwise documented below in the visit note. 

## 2014-04-09 NOTE — Progress Notes (Signed)
   Subjective:    Patient ID: Thomas Chung, male    DOB: 1941/07/25, 73 y.o.   MRN: [id number]  Hypertension This is a chronic problem. The current episode started more than 1 year ago. The problem has been gradually improving since onset. The problem is controlled. Pertinent negatives include no anxiety, blurred vision, chest pain, headaches, malaise/fatigue, neck pain, orthopnea, palpitations, peripheral edema, PND, shortness of breath or sweats. There are no associated agents to hypertension. Past treatments include beta blockers, angiotensin blockers and diuretics. The current treatment provides moderate improvement. There are no compliance problems.  Hypertensive end-organ damage includes CAD/MI.      Review of Systems  Constitutional: Negative.  Negative for malaise/fatigue.  HENT: Negative.   Eyes: Negative.  Negative for blurred vision.  Respiratory: Negative.  Negative for cough, choking, chest tightness, shortness of breath, wheezing and stridor.   Cardiovascular: Negative.  Negative for chest pain, palpitations, orthopnea, leg swelling and PND.  Gastrointestinal: Negative.  Negative for nausea, vomiting, abdominal pain, diarrhea, constipation and blood in stool.  Endocrine: Negative.   Genitourinary: Negative.   Musculoskeletal: Negative.  Negative for arthralgias, back pain, myalgias, neck pain and neck stiffness.  Skin: Negative.  Negative for rash.  Allergic/Immunologic: Negative.   Neurological: Negative.  Negative for dizziness, tremors, weakness, light-headedness and headaches.  Hematological: Negative.  Negative for adenopathy.  Psychiatric/Behavioral: Negative.        Objective:   Physical Exam  Vitals reviewed. Constitutional: He is oriented to person, place, and time. He appears well-developed and well-nourished. No distress.  HENT:  Head: Normocephalic and atraumatic.  Mouth/Throat: Oropharynx is clear and moist. No oropharyngeal exudate.  Eyes:  Conjunctivae are normal. Right eye exhibits no discharge. Left eye exhibits no discharge. No scleral icterus.  Neck: Normal range of motion. Neck supple. No JVD present. No tracheal deviation present. No thyromegaly present.  Cardiovascular: Normal rate, regular rhythm, normal heart sounds and intact distal pulses.  Exam reveals no gallop and no friction rub.   No murmur heard. Pulmonary/Chest: Effort normal and breath sounds normal. No stridor. No respiratory distress. He has no wheezes. He has no rales. He exhibits no tenderness.  Abdominal: Soft. Bowel sounds are normal. He exhibits no distension and no mass. There is no tenderness. There is no rebound and no guarding.  Musculoskeletal: Normal range of motion. He exhibits no edema and no tenderness.  Lymphadenopathy:    He has no cervical adenopathy.  Neurological: He is oriented to person, place, and time.  Skin: Skin is warm and dry. No rash noted. He is not diaphoretic. No erythema. No pallor.  Psychiatric: He has a normal mood and affect. His behavior is normal. Judgment and thought content normal.     Lab Results  Component Value Date   WBC 7.2 12/10/2013   HGB 15.4 12/10/2013   HCT 44.7 12/10/2013   PLT 246.0 12/10/2013   GLUCOSE 93 12/10/2013   CHOL 127 12/10/2013   TRIG 72.0 12/10/2013   HDL 46.90 12/10/2013   LDLCALC 66 12/10/2013   ALT 28 12/10/2013   AST 26 12/10/2013   NA 140 12/10/2013   K 4.0 12/10/2013   CL 106 12/10/2013   CREATININE 1.0 12/10/2013   BUN 21 12/10/2013   CO2 26 12/10/2013   TSH 1.25 12/10/2013   PSA 0.11 12/10/2013   HGBA1C 6.0 12/10/2013   MICROALBUR 0.3 12/10/2013       Assessment & Plan:

## 2014-04-09 NOTE — Assessment & Plan Note (Signed)
I have asked him to f/up with cardiology about this

## 2014-04-09 NOTE — Assessment & Plan Note (Signed)
His blood sugars are well controlled 

## 2014-04-16 ENCOUNTER — Telehealth: Payer: Self-pay

## 2014-04-16 NOTE — Telephone Encounter (Signed)
Patient called to report hip, knee,joint pain, swelling, redness x one week after receiving prevnar. I advised that I would document and update system.

## 2014-05-12 ENCOUNTER — Telehealth: Payer: Self-pay | Admitting: *Deleted

## 2014-05-12 DIAGNOSIS — M79609 Pain in unspecified limb: Secondary | ICD-10-CM | POA: Diagnosis not present

## 2014-05-12 DIAGNOSIS — T148XXA Other injury of unspecified body region, initial encounter: Secondary | ICD-10-CM | POA: Diagnosis not present

## 2014-05-12 NOTE — Telephone Encounter (Signed)
Left msg on triage stating he miss the last step while he was going down to the basment. When he fell heard a pop. Have a bruise on his upper (R) thigh and a little bruise. Wanting to know she he go to urgent care center where he is in [place]. Called pt back advise him if he is having any pain or if he is concern about getting a blood clot to go to urgent care/ER to be evaluated. Pt stated he will go to urgent care center...Thomas Chung

## 2014-05-13 ENCOUNTER — Ambulatory Visit: Payer: Medicare Other | Admitting: Cardiology

## 2014-05-13 DIAGNOSIS — M7989 Other specified soft tissue disorders: Secondary | ICD-10-CM | POA: Diagnosis not present

## 2014-05-13 DIAGNOSIS — M79609 Pain in unspecified limb: Secondary | ICD-10-CM | POA: Diagnosis not present

## 2014-05-21 ENCOUNTER — Ambulatory Visit (INDEPENDENT_AMBULATORY_CARE_PROVIDER_SITE_OTHER): Payer: Medicare Other | Admitting: Internal Medicine

## 2014-05-21 ENCOUNTER — Encounter: Payer: Self-pay | Admitting: Internal Medicine

## 2014-05-21 VITALS — BP 138/68 | HR 78 | Temp 98.1°F | Resp 16 | Ht 76.0 in | Wt 252.2 lb

## 2014-05-21 DIAGNOSIS — I2510 Atherosclerotic heart disease of native coronary artery without angina pectoris: Secondary | ICD-10-CM

## 2014-05-21 DIAGNOSIS — IMO0002 Reserved for concepts with insufficient information to code with codable children: Secondary | ICD-10-CM

## 2014-05-21 DIAGNOSIS — N644 Mastodynia: Secondary | ICD-10-CM | POA: Insufficient documentation

## 2014-05-21 DIAGNOSIS — S86911A Strain of unspecified muscle(s) and tendon(s) at lower leg level, right leg, initial encounter: Secondary | ICD-10-CM

## 2014-05-21 NOTE — Progress Notes (Signed)
Pre visit review using our clinic review tool, if applicable. No additional management support is needed unless otherwise documented below in the visit note. 

## 2014-05-21 NOTE — Patient Instructions (Signed)

## 2014-05-24 ENCOUNTER — Encounter: Payer: Self-pay | Admitting: Internal Medicine

## 2014-05-24 DIAGNOSIS — S86919A Strain of unspecified muscle(s) and tendon(s) at lower leg level, unspecified leg, initial encounter: Secondary | ICD-10-CM | POA: Insufficient documentation

## 2014-05-24 NOTE — Assessment & Plan Note (Signed)
He is improving and no complications noted today Cont RICE as needed Light activity as tolerated

## 2014-05-24 NOTE — Progress Notes (Signed)
   Subjective:    Patient ID: Thomas Chung, male    DOB: [birth year], 73 y.o.   MRN: [id number]  Injury The incident occurred 5 to 7 days ago. The incident occurred at home. The injury mechanism was a fall. The context of the injury is unknown. There is an injury to the right thigh (right thigh). He is experiencing no pain. It is unlikely that a foreign body is present. Pertinent negatives include no abdominal pain, chest pain, coughing, inability to bear weight, light-headedness, loss of consciousness, nausea, neck pain, numbness, tingling or weakness.      Review of Systems  Respiratory: Negative for cough.   Cardiovascular: Negative for chest pain.  Gastrointestinal: Negative for nausea and abdominal pain.  Musculoskeletal: Negative for neck pain.  Neurological: Negative for tingling, loss of consciousness, weakness, light-headedness and numbness.       Objective:   Physical Exam  Vitals reviewed. Constitutional: He is oriented to person, place, and time. He appears well-developed and well-nourished. No distress.  HENT:  Head: Normocephalic and atraumatic.  Mouth/Throat: Oropharynx is clear and moist. No oropharyngeal exudate.  Eyes: Conjunctivae are normal. Right eye exhibits no discharge. Left eye exhibits no discharge. No scleral icterus.  Neck: Normal range of motion. Neck supple. No JVD present. No tracheal deviation present. No thyromegaly present.  Cardiovascular: Normal rate, regular rhythm, normal heart sounds and intact distal pulses.  Exam reveals no gallop and no friction rub.   No murmur heard. Pulmonary/Chest: Effort normal and breath sounds normal. No stridor. No respiratory distress. He has no wheezes. He has no rales. He exhibits no tenderness.  Abdominal: Soft. Bowel sounds are normal. He exhibits no distension. There is no tenderness. There is no rebound and no guarding.  Musculoskeletal: Normal range of motion. He exhibits no edema and no tenderness.   Right knee: Normal. He exhibits normal range of motion, no swelling, no effusion, no ecchymosis, no deformity, no laceration, no erythema, normal alignment, no LCL laxity, normal patellar mobility and no bony tenderness. No tenderness found.       Right upper leg: He exhibits deformity. He exhibits no tenderness, no bony tenderness, no swelling, no edema and no laceration.       Legs: Lymphadenopathy:    He has no cervical adenopathy.  Neurological: He is alert and oriented to person, place, and time. He has normal reflexes. He displays normal reflexes. No cranial nerve deficit. He exhibits normal muscle tone. Coordination normal.  Skin: Skin is warm and dry. No rash noted. He is not diaphoretic. No erythema. No pallor.          Assessment & Plan:

## 2014-06-03 ENCOUNTER — Other Ambulatory Visit: Payer: Self-pay | Admitting: Internal Medicine

## 2014-06-03 DIAGNOSIS — L608 Other nail disorders: Secondary | ICD-10-CM | POA: Diagnosis not present

## 2014-06-03 DIAGNOSIS — L84 Corns and callosities: Secondary | ICD-10-CM | POA: Diagnosis not present

## 2014-06-03 DIAGNOSIS — I739 Peripheral vascular disease, unspecified: Secondary | ICD-10-CM | POA: Diagnosis not present

## 2014-06-03 DIAGNOSIS — E1159 Type 2 diabetes mellitus with other circulatory complications: Secondary | ICD-10-CM | POA: Diagnosis not present

## 2014-06-03 DIAGNOSIS — R269 Unspecified abnormalities of gait and mobility: Secondary | ICD-10-CM | POA: Diagnosis not present

## 2014-06-09 ENCOUNTER — Encounter: Payer: Self-pay | Admitting: Cardiology

## 2014-06-09 ENCOUNTER — Ambulatory Visit (INDEPENDENT_AMBULATORY_CARE_PROVIDER_SITE_OTHER): Payer: Medicare Other | Admitting: Cardiology

## 2014-06-09 VITALS — BP 120/78 | HR 68 | Ht 76.0 in | Wt 256.0 lb

## 2014-06-09 DIAGNOSIS — I2510 Atherosclerotic heart disease of native coronary artery without angina pectoris: Secondary | ICD-10-CM | POA: Diagnosis not present

## 2014-06-09 DIAGNOSIS — E785 Hyperlipidemia, unspecified: Secondary | ICD-10-CM

## 2014-06-09 DIAGNOSIS — Z8249 Family history of ischemic heart disease and other diseases of the circulatory system: Secondary | ICD-10-CM | POA: Diagnosis not present

## 2014-06-09 DIAGNOSIS — I10 Essential (primary) hypertension: Secondary | ICD-10-CM | POA: Diagnosis not present

## 2014-06-09 DIAGNOSIS — R7302 Impaired glucose tolerance (oral): Secondary | ICD-10-CM | POA: Insufficient documentation

## 2014-06-09 NOTE — Patient Instructions (Signed)
Your physician recommends that you continue on your current medications as directed. Please refer to the Current Medication list given to you today.  Your physician has requested that you have an exercise tolerance test. For further information please visit [email or web address]. Please also follow instruction sheet, as given.  Your physician wants you to follow-up in: 1 year with Dr Mallie Snooks will receive a reminder letter in the mail two months in advance. If you don't receive a letter, please call our office to schedule the follow-up appointment.

## 2014-06-09 NOTE — Progress Notes (Signed)
[facility], [address], [place]  [address] Phone: [phone] Fax:  [phone]  Date:  06/09/2014   ID:  Thomas Chung, DOB 12/20/1940, MRN [id number]  PCP:  Scarlette Calico, MD  Cardiologist:  Fransico Him, MD     History of Present Illness: Thomas Chung is a 73 y.o. male with a history of HTN and dyslipidemia who was referred today for cardiac eval.  He is unsure why he is here today.  He was seen years ago for irregular heart beat years ago but was normal and was discharged by Dr. Lia Foyer.  He denies any chest pain or pressure, DOE or SOB.  He denies any LE edema, palpitations or syncope.  He thinks his PCP wanted him evaluated due to his CRF and his exercise that he does.  He says that he does aerobics 4 days weekly and Tai Chi.  He also walks and rides a bike as well as swims.  It states that he has a history of CAD and MI but patient states he has never had an MI, CAD or heart cath.   Wt Readings from Last 3 Encounters:  06/09/14 256 lb (116.121 kg)  05/21/14 252 lb 4 oz (114.42 kg)  04/09/14 247 lb 12.8 oz (112.401 kg)     Past Medical History  Diagnosis Date  . Depression   . Hypertension   . Hyperlipidemia   . Fatty liver   . Hx of colonic polyps   . Neuromuscular disorder     neuropathy feet  . HOH (hard of hearing)     bilateral hearing aides  . Glucose intolerance (impaired glucose tolerance)     Current Outpatient Prescriptions  Medication Sig Dispense Refill  . aspirin 81 MG tablet Take 81 mg by mouth daily.        Marland Kitchen atorvastatin (LIPITOR) 40 MG tablet TAKE 1 TABLET BY MOUTH EVERY DAY  90 tablet  3  . COD LIVER OIL PO Take by mouth.      Marland Kitchen glucose blood (BAYER CONTOUR TEST) test strip TEST ONCE DAILY (250.62)  100 each  11  . L-Methylfolate-Algae-B12-B6 (METANX) 3-90.314-2-35 MG CAPS Take 1 capsule by mouth 2 (two) times daily.  180 capsule  3  . Lancets MISC Test twice daily  100 each  11  . losartan-hydrochlorothiazide (HYZAAR) 100-12.5 MG  per tablet TAKE 1 TABLET EVERY DAY FOR HYPERTENSION  90 tablet  0  . metoprolol succinate (TOPROL-XL) 50 MG 24 hr tablet TAKE 1 TABLET BY MOUTH ONCE DAILY  90 tablet  2   Current Facility-Administered Medications  Medication Dose Route Frequency Provider Last Rate Last Dose  . pneumococcal 13-valent conjugate vaccine (PREVNAR 13) injection 0.5 mL  0.5 mL Intramuscular Tomorrow-1000 Janith Lima, MD        Allergies:    Allergies  Allergen Reactions  . Prevnar [Pneumococcal 13-Val Conj Vacc]     Joint pain, swelling    Social History:  The patient  reports that he has quit smoking. He has never used smokeless tobacco. He reports that he does not drink alcohol or use illicit drugs.   Family History:  The patient's family history includes Alcohol abuse in an other family member; Arrhythmia in his mother; Coronary artery disease in an other family member; Diabetes in an other family member; Heart attack in his mother; Heart disease in his mother; Heart failure in his father. There is no history of Cancer, Stroke, Hyperlipidemia, Hypertension, or Kidney  disease.   ROS:  Please see the history of present illness.      All other systems reviewed and negative.   PHYSICAL EXAM: VS:  BP 120/78  Pulse 68  Ht _0  (1.93 m)  Wt 256 lb (116.121 kg)  BMI 31.17 kg/m2 Well nourished, well developed, in no acute distress HEENT: normal Neck: no JVD Cardiac:  normal S1, S2; RRR; no murmur Lungs:  clear to auscultation bilaterally, no wheezing, rhonchi or rales Abd: soft, nontender, no hepatomegaly Ext: no edema Skin: warm and dry Neuro:  CNs 2-12 intact, no focal abnormalities noted  EKG:  NSR with no ST changes     ASSESSMENT AND PLAN:  1. Dyslipidemia 2. HTN 3. Family history of CAD at an early age - He is completely asymptomatic.  I will set him up for an ETT to rule out ischemia since he EKG is normal and also to make sure it is safe for him to continue his current rigorous exercise  program.  Followup with me in 1 year  Signed, Fransico Him, MD 06/09/2014 4:15 PM

## 2014-06-20 ENCOUNTER — Other Ambulatory Visit: Payer: Self-pay | Admitting: Internal Medicine

## 2014-07-01 ENCOUNTER — Ambulatory Visit (INDEPENDENT_AMBULATORY_CARE_PROVIDER_SITE_OTHER): Payer: Medicare Other | Admitting: Internal Medicine

## 2014-07-01 ENCOUNTER — Encounter: Payer: Self-pay | Admitting: Internal Medicine

## 2014-07-01 ENCOUNTER — Ambulatory Visit (INDEPENDENT_AMBULATORY_CARE_PROVIDER_SITE_OTHER)
Admission: RE | Admit: 2014-07-01 | Discharge: 2014-07-01 | Disposition: A | Discharge status: Home / Self Care | Payer: Medicare Other | Source: Ambulatory Visit | Attending: Internal Medicine | Admitting: Internal Medicine

## 2014-07-01 ENCOUNTER — Other Ambulatory Visit: Payer: Self-pay | Admitting: Internal Medicine

## 2014-07-01 VITALS — BP 160/90 | HR 75 | Temp 98.4°F | Resp 16 | Ht 76.0 in | Wt 257.0 lb

## 2014-07-01 DIAGNOSIS — N529 Male erectile dysfunction, unspecified: Secondary | ICD-10-CM

## 2014-07-01 DIAGNOSIS — R059 Cough, unspecified: Secondary | ICD-10-CM

## 2014-07-01 DIAGNOSIS — E1140 Type 2 diabetes mellitus with diabetic neuropathy, unspecified: Secondary | ICD-10-CM

## 2014-07-01 DIAGNOSIS — J13 Pneumonia due to Streptococcus pneumoniae: Secondary | ICD-10-CM | POA: Diagnosis not present

## 2014-07-01 DIAGNOSIS — R05 Cough: Secondary | ICD-10-CM | POA: Diagnosis not present

## 2014-07-01 DIAGNOSIS — I2510 Atherosclerotic heart disease of native coronary artery without angina pectoris: Secondary | ICD-10-CM

## 2014-07-01 DIAGNOSIS — E1142 Type 2 diabetes mellitus with diabetic polyneuropathy: Secondary | ICD-10-CM

## 2014-07-01 DIAGNOSIS — I10 Essential (primary) hypertension: Secondary | ICD-10-CM | POA: Diagnosis not present

## 2014-07-01 DIAGNOSIS — E1149 Type 2 diabetes mellitus with other diabetic neurological complication: Secondary | ICD-10-CM | POA: Diagnosis not present

## 2014-07-01 MED ORDER — METANX 3-90.314-2-35 MG PO CAPS: 1.0000 | ORAL_CAPSULE | Freq: Two times a day (BID) | ORAL | Status: DC

## 2014-07-01 MED ORDER — CEFUROXIME AXETIL 500 MG PO TABS: 500.0000 mg | ORAL_TABLET | Freq: Two times a day (BID) | ORAL | Status: DC

## 2014-07-01 MED ORDER — PROMETHAZINE-DM 6.25-15 MG/5ML PO SYRP: 5.0000 mL | ORAL_SOLUTION | Freq: Four times a day (QID) | ORAL | Status: DC | PRN

## 2014-07-01 NOTE — Progress Notes (Signed)
Pre visit review using our clinic review tool, if applicable. No additional management support is needed unless otherwise documented below in the visit note. 

## 2014-07-01 NOTE — Patient Instructions (Signed)
Cough, Adult  A cough is a reflex that helps clear your throat and airways. It can help heal the body or may be a reaction to an irritated airway. A cough may only last 2 or 3 weeks (acute) or may last more than 8 weeks (chronic).  CAUSES Acute cough:  Viral or bacterial infections. Chronic cough:  Infections.  Allergies.  Asthma.  Post-nasal drip.  Smoking.  Heartburn or acid reflux.  Some medicines.  Chronic lung problems (COPD).  Cancer. SYMPTOMS   Cough.  Fever.  Chest pain.  Increased breathing rate.  High-pitched whistling sound when breathing (wheezing).  Colored mucus that you cough up (sputum). TREATMENT   A bacterial cough may be treated with antibiotic medicine.  A viral cough must run its course and will not respond to antibiotics.  Your caregiver may recommend other treatments if you have a chronic cough. HOME CARE INSTRUCTIONS   Only take over-the-counter or prescription medicines for pain, discomfort, or fever as directed by your caregiver. Use cough suppressants only as directed by your caregiver.  Use a cold steam vaporizer or humidifier in your bedroom or home to help loosen secretions.  Sleep in a semi-upright position if your cough is worse at night.  Rest as needed.  Stop smoking if you smoke. SEEK IMMEDIATE MEDICAL CARE IF:   You have pus in your sputum.  Your cough starts to worsen.  You cannot control your cough with suppressants and are losing sleep.  You begin coughing up blood.  You have difficulty breathing.  You develop pain which is getting worse or is uncontrolled with medicine.  You have a fever. MAKE SURE YOU:   Understand these instructions.  Will watch your condition.  Will get help right away if you are not doing well or get worse. Document Released: 05/05/2011 Document Revised: 01/29/2012 Document Reviewed: 05/05/2011 Exit[employer] Patient Information 2015 [employer]. This information is not intended  to replace advice given to you by your health care provider. Make sure you discuss any questions you have with your health care provider.  

## 2014-07-01 NOTE — Progress Notes (Signed)
   Subjective:    Patient ID: Thomas Chung, male    DOB: 25-May-1941, 73 y.o.   MRN: [id number]  Cough This is a new problem. The current episode started in the past 7 days. The problem has been gradually worsening. The problem occurs every few hours. The cough is productive of purulent sputum. Associated symptoms include chills and a sore throat. Pertinent negatives include no chest pain, ear congestion, ear pain, fever, headaches, heartburn, hemoptysis, myalgias, nasal congestion, postnasal drip, rash, rhinorrhea, shortness of breath, sweats, weight loss or wheezing. He has tried OTC cough suppressant for the symptoms. The treatment provided mild relief. His past medical history is significant for pneumonia. There is no history of asthma, bronchiectasis, bronchitis, COPD, emphysema or environmental allergies.      Review of Systems  Constitutional: Positive for chills. Negative for fever and weight loss.  HENT: Positive for sore throat. Negative for ear pain, postnasal drip and rhinorrhea.   Respiratory: Positive for cough. Negative for hemoptysis, shortness of breath and wheezing.   Cardiovascular: Negative for chest pain.  Gastrointestinal: Negative for heartburn.  Musculoskeletal: Negative for myalgias.  Skin: Negative for rash.  Allergic/Immunologic: Negative for environmental allergies.  Neurological: Negative for headaches.  All other systems reviewed and are negative.      Objective:   Physical Exam  Vitals reviewed. Constitutional: He is oriented to person, place, and time. He appears well-developed and well-nourished.  Non-toxic appearance. He does not have a sickly appearance. He does not appear ill. No distress.  HENT:  Head: Normocephalic and atraumatic.  Mouth/Throat: Oropharynx is clear and moist. No oropharyngeal exudate.  Eyes: Conjunctivae are normal. Right eye exhibits no discharge. Left eye exhibits no discharge. No scleral icterus.  Neck: Normal range of  motion. Neck supple. No JVD present. No tracheal deviation present. No thyromegaly present.  Cardiovascular: Normal rate, regular rhythm, normal heart sounds and intact distal pulses.  Exam reveals no gallop and no friction rub.   No murmur heard. Pulmonary/Chest: Effort normal and breath sounds normal. No stridor. No respiratory distress. He has no wheezes. He has no rales. He exhibits no tenderness.  Abdominal: Soft. Bowel sounds are normal. He exhibits no distension and no mass. There is no tenderness. There is no rebound and no guarding.  Musculoskeletal: Normal range of motion. He exhibits no edema and no tenderness.  Lymphadenopathy:    He has no cervical adenopathy.  Neurological: He is oriented to person, place, and time.  Skin: Skin is warm and dry. No rash noted. He is not diaphoretic. No erythema. No pallor.  Psychiatric: He has a normal mood and affect. His behavior is normal. Judgment and thought content normal.     Lab Results  Component Value Date   WBC 7.2 12/10/2013   HGB 15.4 12/10/2013   HCT 44.7 12/10/2013   PLT 246.0 12/10/2013   GLUCOSE 96 04/09/2014   CHOL 127 12/10/2013   TRIG 72.0 12/10/2013   HDL 46.90 12/10/2013   LDLCALC 66 12/10/2013   ALT 28 12/10/2013   AST 26 12/10/2013   NA 140 04/09/2014   K 4.1 04/09/2014   CL 104 04/09/2014   CREATININE 1.2 04/09/2014   BUN 20 04/09/2014   CO2 28 04/09/2014   TSH 1.25 12/10/2013   PSA 0.11 12/10/2013   HGBA1C 6.0 04/09/2014   MICROALBUR 0.3 12/10/2013       Assessment & Plan:

## 2014-07-02 ENCOUNTER — Encounter: Payer: Self-pay | Admitting: Internal Medicine

## 2014-07-02 ENCOUNTER — Other Ambulatory Visit: Payer: Self-pay | Admitting: Internal Medicine

## 2014-07-02 DIAGNOSIS — R059 Cough, unspecified: Secondary | ICD-10-CM

## 2014-07-02 DIAGNOSIS — R05 Cough: Secondary | ICD-10-CM

## 2014-07-02 DIAGNOSIS — J13 Pneumonia due to Streptococcus pneumoniae: Secondary | ICD-10-CM

## 2014-07-02 MED ORDER — PROMETHAZINE-DM 6.25-15 MG/5ML PO SYRP: 5.0000 mL | ORAL_SOLUTION | Freq: Four times a day (QID) | ORAL | Status: DC | PRN

## 2014-07-02 MED ORDER — CEFUROXIME AXETIL 500 MG PO TABS: 500.0000 mg | ORAL_TABLET | Freq: Two times a day (BID) | ORAL | Status: DC

## 2014-07-02 NOTE — Assessment & Plan Note (Signed)
His BP is not well controlled Will recheck this next visit when he it not acutely ill

## 2014-07-02 NOTE — Assessment & Plan Note (Signed)
Will treat the infection with ceftin and will control the cough with phenergan-dm

## 2014-07-02 NOTE — Assessment & Plan Note (Signed)
His CXR is normal 

## 2014-07-06 ENCOUNTER — Telehealth (HOSPITAL_COMMUNITY): Payer: Self-pay | Admitting: *Deleted

## 2014-07-08 ENCOUNTER — Encounter (HOSPITAL_COMMUNITY): Payer: Medicare Other

## 2014-07-14 ENCOUNTER — Telehealth (HOSPITAL_COMMUNITY): Payer: Self-pay

## 2014-07-14 NOTE — Telephone Encounter (Signed)
Encounter complete. 

## 2014-07-16 ENCOUNTER — Ambulatory Visit (HOSPITAL_COMMUNITY)
Admission: RE | Admit: 2014-07-16 | Discharge: 2014-07-16 | Disposition: A | Discharge status: Home / Self Care | Payer: Medicare Other | Source: Ambulatory Visit | Attending: Cardiology | Admitting: Cardiology

## 2014-07-16 DIAGNOSIS — R079 Chest pain, unspecified: Secondary | ICD-10-CM | POA: Diagnosis not present

## 2014-07-16 DIAGNOSIS — Z8249 Family history of ischemic heart disease and other diseases of the circulatory system: Secondary | ICD-10-CM

## 2014-07-16 DIAGNOSIS — E785 Hyperlipidemia, unspecified: Secondary | ICD-10-CM | POA: Diagnosis not present

## 2014-07-16 DIAGNOSIS — I10 Essential (primary) hypertension: Secondary | ICD-10-CM

## 2014-07-16 NOTE — Procedures (Signed)
Exercise Treadmill Test  Pre-Exercise Testing Evaluation NSR, normal  Test  Exercise Tolerance Test Ordering MD: Fransico Him, MD  Interpreting MD:   Unique Test No:1   Treadmill:  1  Indication for ETT: chest pain - rule out ischemia  Contraindication to ETT: No   Stress Modality: exercise - treadmill  Cardiac Imaging Performed: non   Protocol: standard Bruce - maximal  Max BP:  208/88  Max MPHR (bpm): 148 85% MPR (bpm):  126  MPHR obtained (bpm):  153 % MPHR obtained: 103  Reached 85% MPHR (min:sec):  9:05 Total Exercise Time (min-sec):  12:03  Workload in METS:  13.70 Borg Scale:   Reason ETT Terminated:  fatigue    ST Segment Analysis At Rest: normal ST segments - no evidence of significant ST depression With Exercise: no evidence of significant ST depression  Other Information Arrhythmia:  Yes Angina during ETT:  absent (0) Quality of ETT:  diagnostic  ETT Interpretation:  normal - no evidence of ischemia by ST analysis  Comments: Excellent exercise tolerance.  Mildly hypertensive response to exercise  Sanda Klein, MD, [facility] [phone] office [phone] pager

## 2014-07-29 ENCOUNTER — Encounter: Payer: Self-pay | Admitting: Internal Medicine

## 2014-07-29 ENCOUNTER — Ambulatory Visit (INDEPENDENT_AMBULATORY_CARE_PROVIDER_SITE_OTHER): Payer: Medicare Other | Admitting: Internal Medicine

## 2014-07-29 VITALS — BP 138/84 | HR 63 | Temp 98.3°F | Resp 18 | Ht 76.0 in | Wt 254.8 lb

## 2014-07-29 DIAGNOSIS — Z23 Encounter for immunization: Secondary | ICD-10-CM | POA: Diagnosis not present

## 2014-07-29 DIAGNOSIS — I2510 Atherosclerotic heart disease of native coronary artery without angina pectoris: Secondary | ICD-10-CM | POA: Diagnosis not present

## 2014-07-29 DIAGNOSIS — I10 Essential (primary) hypertension: Secondary | ICD-10-CM

## 2014-07-29 DIAGNOSIS — J13 Pneumonia due to Streptococcus pneumoniae: Secondary | ICD-10-CM

## 2014-07-29 NOTE — Patient Instructions (Signed)

## 2014-07-29 NOTE — Progress Notes (Signed)
Pre visit review using our clinic review tool, if applicable. No additional management support is needed unless otherwise documented below in the visit note. 

## 2014-07-30 ENCOUNTER — Encounter: Payer: Self-pay | Admitting: Internal Medicine

## 2014-07-30 NOTE — Assessment & Plan Note (Signed)
This has resolved.

## 2014-07-30 NOTE — Progress Notes (Signed)
   Subjective:    Patient ID: Thomas Chung, male    DOB: Jun 17, 1941, 73 y.o.   MRN: [id number]  HPI Comments: He returns for f/up after a recent URI, all of the symptoms have resolved and he feels well today.     Review of Systems  Constitutional: Negative.  Negative for fever, chills, diaphoresis, appetite change and fatigue.  HENT: Negative.  Negative for congestion, dental problem, postnasal drip, rhinorrhea, sinus pressure, sore throat, trouble swallowing and voice change.   Eyes: Negative.   Respiratory: Negative.  Negative for cough, choking, chest tightness, shortness of breath and stridor.   Cardiovascular: Negative.  Negative for chest pain, palpitations and leg swelling.  Gastrointestinal: Negative.  Negative for nausea, vomiting, abdominal pain, diarrhea and blood in stool.  Endocrine: Negative.   Genitourinary: Negative.   Musculoskeletal: Negative.   Skin: Negative.  Negative for rash.  Allergic/Immunologic: Negative.   Neurological: Negative for dizziness, weakness, light-headedness and numbness.  Hematological: Negative.  Negative for adenopathy. Does not bruise/bleed easily.  Psychiatric/Behavioral: Negative.        Objective:   Physical Exam  Vitals reviewed. Constitutional: He is oriented to person, place, and time. He appears well-developed and well-nourished. No distress.  HENT:  Head: Normocephalic and atraumatic.  Mouth/Throat: Oropharynx is clear and moist. No oropharyngeal exudate.  Eyes: Conjunctivae are normal. Right eye exhibits no discharge. Left eye exhibits no discharge. No scleral icterus.  Neck: Normal range of motion. Neck supple. No JVD present. No tracheal deviation present. No thyromegaly present.  Cardiovascular: Normal rate, regular rhythm, normal heart sounds and intact distal pulses.  Exam reveals no gallop and no friction rub.   No murmur heard. Pulmonary/Chest: Effort normal and breath sounds normal. No stridor. No respiratory  distress. He has no wheezes. He has no rales. He exhibits no tenderness.  Abdominal: Soft. Bowel sounds are normal. He exhibits no distension and no mass. There is no tenderness. There is no rebound and no guarding.  Musculoskeletal: Normal range of motion. He exhibits no edema and no tenderness.  Lymphadenopathy:    He has no cervical adenopathy.  Neurological: He is oriented to person, place, and time.  Skin: Skin is warm and dry. No rash noted. He is not diaphoretic. No erythema. No pallor.          Assessment & Plan:

## 2014-07-30 NOTE — Assessment & Plan Note (Signed)
His BP is well controlled 

## 2014-08-01 ENCOUNTER — Telehealth: Payer: Self-pay

## 2014-08-01 NOTE — Telephone Encounter (Signed)
9/9 last BP reading was 138/84

## 2014-08-24 ENCOUNTER — Other Ambulatory Visit: Payer: Self-pay | Admitting: Geriatric Medicine

## 2014-08-24 MED ORDER — ATORVASTATIN CALCIUM 40 MG PO TABS: ORAL_TABLET | ORAL | Status: DC

## 2014-08-27 DIAGNOSIS — E1151 Type 2 diabetes mellitus with diabetic peripheral angiopathy without gangrene: Secondary | ICD-10-CM | POA: Diagnosis not present

## 2014-08-27 DIAGNOSIS — L84 Corns and callosities: Secondary | ICD-10-CM | POA: Diagnosis not present

## 2014-08-27 DIAGNOSIS — L603 Nail dystrophy: Secondary | ICD-10-CM | POA: Diagnosis not present

## 2014-08-27 DIAGNOSIS — I739 Peripheral vascular disease, unspecified: Secondary | ICD-10-CM | POA: Diagnosis not present

## 2014-09-10 DIAGNOSIS — L812 Freckles: Secondary | ICD-10-CM | POA: Diagnosis not present

## 2014-09-10 DIAGNOSIS — L821 Other seborrheic keratosis: Secondary | ICD-10-CM | POA: Diagnosis not present

## 2014-09-10 DIAGNOSIS — D2262 Melanocytic nevi of left upper limb, including shoulder: Secondary | ICD-10-CM | POA: Diagnosis not present

## 2014-09-10 DIAGNOSIS — L570 Actinic keratosis: Secondary | ICD-10-CM | POA: Diagnosis not present

## 2014-09-10 DIAGNOSIS — L918 Other hypertrophic disorders of the skin: Secondary | ICD-10-CM | POA: Diagnosis not present

## 2014-09-10 DIAGNOSIS — D2261 Melanocytic nevi of right upper limb, including shoulder: Secondary | ICD-10-CM | POA: Diagnosis not present

## 2014-09-10 DIAGNOSIS — Z85828 Personal history of other malignant neoplasm of skin: Secondary | ICD-10-CM | POA: Diagnosis not present

## 2014-10-06 LAB — HM DIABETES EYE EXAM

## 2014-10-08 DIAGNOSIS — E11329 Type 2 diabetes mellitus with mild nonproliferative diabetic retinopathy without macular edema: Secondary | ICD-10-CM | POA: Diagnosis not present

## 2014-10-08 DIAGNOSIS — H04123 Dry eye syndrome of bilateral lacrimal glands: Secondary | ICD-10-CM | POA: Diagnosis not present

## 2014-10-08 LAB — HM DIABETES EYE EXAM

## 2014-10-12 ENCOUNTER — Encounter: Payer: Self-pay | Admitting: Internal Medicine

## 2014-10-12 ENCOUNTER — Other Ambulatory Visit (INDEPENDENT_AMBULATORY_CARE_PROVIDER_SITE_OTHER): Payer: Medicare Other

## 2014-10-12 ENCOUNTER — Ambulatory Visit (INDEPENDENT_AMBULATORY_CARE_PROVIDER_SITE_OTHER): Payer: Medicare Other | Admitting: Internal Medicine

## 2014-10-12 VITALS — BP 120/64 | HR 65 | Temp 98.2°F | Resp 16 | Ht 76.0 in | Wt 256.0 lb

## 2014-10-12 DIAGNOSIS — I2510 Atherosclerotic heart disease of native coronary artery without angina pectoris: Secondary | ICD-10-CM

## 2014-10-12 DIAGNOSIS — E118 Type 2 diabetes mellitus with unspecified complications: Secondary | ICD-10-CM | POA: Diagnosis not present

## 2014-10-12 DIAGNOSIS — I10 Essential (primary) hypertension: Secondary | ICD-10-CM

## 2014-10-12 DIAGNOSIS — E785 Hyperlipidemia, unspecified: Secondary | ICD-10-CM

## 2014-10-12 LAB — LIPID PANEL
Cholesterol: 133 mg/dL (ref 0–200)
HDL: 40.7 mg/dL (ref >39.00)
LDL Cholesterol: 77 mg/dL (ref 0–99)
NonHDL: 92.3
Total CHOL/HDL Ratio: 3
Triglycerides: 76 mg/dL (ref 0.0–149.0)
VLDL: 15.2 mg/dL (ref 0.0–40.0)

## 2014-10-12 LAB — BASIC METABOLIC PANEL
BUN: 26 mg/dL — ABNORMAL HIGH (ref 6–23)
CO2: 27 mEq/L (ref 19–32)
Calcium: 9.4 mg/dL (ref 8.4–10.5)
Chloride: 103 mEq/L (ref 96–112)
Creatinine, Ser: 1.1 mg/dL (ref 0.4–1.5)
GFR: 72.82 mL/min (ref >60.00)
Glucose, Bld: 99 mg/dL (ref 70–99)
Potassium: 3.8 mEq/L (ref 3.5–5.1)
Sodium: 138 mEq/L (ref 135–145)

## 2014-10-12 LAB — HEMOGLOBIN A1C: Hgb A1c MFr Bld: 6.3 % (ref 4.6–6.5)

## 2014-10-12 NOTE — Progress Notes (Signed)
Pre visit review using our clinic review tool, if applicable. No additional management support is needed unless otherwise documented below in the visit note. 

## 2014-10-12 NOTE — Assessment & Plan Note (Signed)
His blood sugars are well controlled Will cont the current meds

## 2014-10-12 NOTE — Patient Instructions (Signed)

## 2014-10-12 NOTE — Progress Notes (Signed)
   Subjective:    Patient ID: Thomas Chung, male    DOB: Oct 08, 1941, 73 y.o.   MRN: [id number]  Hypertension This is a chronic problem. The current episode started more than 1 year ago. The problem is unchanged. The problem is controlled. Pertinent negatives include no anxiety, blurred vision, chest pain, headaches, malaise/fatigue, neck pain, orthopnea, palpitations, peripheral edema, PND, shortness of breath or sweats. Past treatments include angiotensin blockers, beta blockers and diuretics. The current treatment provides significant improvement. There are no compliance problems.       Review of Systems  Constitutional: Negative.  Negative for fever, chills, malaise/fatigue, diaphoresis, appetite change and fatigue.  HENT: Negative.   Eyes: Negative.  Negative for blurred vision.  Respiratory: Negative.  Negative for cough, choking, chest tightness, shortness of breath, wheezing and stridor.   Cardiovascular: Negative.  Negative for chest pain, palpitations, orthopnea, leg swelling and PND.  Gastrointestinal: Negative.  Negative for nausea, vomiting, abdominal pain, diarrhea, constipation and blood in stool.  Endocrine: Negative.  Negative for polydipsia, polyphagia and polyuria.  Genitourinary: Negative.   Musculoskeletal: Negative.  Negative for myalgias, back pain, arthralgias and neck pain.  Skin: Negative.  Negative for rash.  Allergic/Immunologic: Negative.   Neurological: Negative.  Negative for headaches.  Hematological: Negative.  Negative for adenopathy. Does not bruise/bleed easily.  Psychiatric/Behavioral: Negative.        Objective:   Physical Exam  Constitutional: He is oriented to person, place, and time. He appears well-developed and well-nourished. No distress.  HENT:  Head: Normocephalic and atraumatic.  Mouth/Throat: Oropharynx is clear and moist. No oropharyngeal exudate.  Eyes: Conjunctivae are normal. Right eye exhibits no discharge. Left eye exhibits no  discharge. No scleral icterus.  Neck: Normal range of motion. Neck supple. No JVD present. No tracheal deviation present. No thyromegaly present.  Cardiovascular: Normal rate, regular rhythm, normal heart sounds and intact distal pulses.  Exam reveals no gallop and no friction rub.   No murmur heard. Pulmonary/Chest: Effort normal and breath sounds normal. No stridor. No respiratory distress. He has no wheezes. He has no rales. He exhibits no tenderness.  Abdominal: Soft. Bowel sounds are normal. He exhibits no distension and no mass. There is no tenderness. There is no rebound and no guarding.  Musculoskeletal: Normal range of motion. He exhibits no edema or tenderness.  Lymphadenopathy:    He has no cervical adenopathy.  Neurological: He is oriented to person, place, and time.  Skin: Skin is warm and dry. No rash noted. He is not diaphoretic. No erythema. No pallor.  Psychiatric: He has a normal mood and affect. His behavior is normal. Judgment and thought content normal.  Vitals reviewed.     Lab Results  Component Value Date   WBC 7.2 12/10/2013   HGB 15.4 12/10/2013   HCT 44.7 12/10/2013   PLT 246.0 12/10/2013   GLUCOSE 96 04/09/2014   CHOL 127 12/10/2013   TRIG 72.0 12/10/2013   HDL 46.90 12/10/2013   LDLCALC 66 12/10/2013   ALT 28 12/10/2013   AST 26 12/10/2013   NA 140 04/09/2014   K 4.1 04/09/2014   CL 104 04/09/2014   CREATININE 1.2 04/09/2014   BUN 20 04/09/2014   CO2 28 04/09/2014   TSH 1.25 12/10/2013   PSA 0.11 12/10/2013   HGBA1C 6.0 04/09/2014   MICROALBUR 0.3 12/10/2013      Assessment & Plan:

## 2014-10-12 NOTE — Assessment & Plan Note (Signed)
His BP is well controlled I will monitor his lytes and renal function 

## 2014-11-05 ENCOUNTER — Encounter: Payer: Self-pay | Admitting: Internal Medicine

## 2014-11-17 ENCOUNTER — Other Ambulatory Visit: Payer: Self-pay | Admitting: Dermatology

## 2014-11-17 DIAGNOSIS — C4442 Squamous cell carcinoma of skin of scalp and neck: Secondary | ICD-10-CM | POA: Diagnosis not present

## 2014-11-17 DIAGNOSIS — D485 Neoplasm of uncertain behavior of skin: Secondary | ICD-10-CM | POA: Diagnosis not present

## 2014-11-17 DIAGNOSIS — Z85828 Personal history of other malignant neoplasm of skin: Secondary | ICD-10-CM | POA: Diagnosis not present

## 2014-11-30 DIAGNOSIS — C4442 Squamous cell carcinoma of skin of scalp and neck: Secondary | ICD-10-CM | POA: Diagnosis not present

## 2014-11-30 DIAGNOSIS — Z85828 Personal history of other malignant neoplasm of skin: Secondary | ICD-10-CM | POA: Diagnosis not present

## 2014-12-03 DIAGNOSIS — L603 Nail dystrophy: Secondary | ICD-10-CM | POA: Diagnosis not present

## 2014-12-03 DIAGNOSIS — L84 Corns and callosities: Secondary | ICD-10-CM | POA: Diagnosis not present

## 2014-12-03 DIAGNOSIS — I739 Peripheral vascular disease, unspecified: Secondary | ICD-10-CM | POA: Diagnosis not present

## 2014-12-03 DIAGNOSIS — E1151 Type 2 diabetes mellitus with diabetic peripheral angiopathy without gangrene: Secondary | ICD-10-CM | POA: Diagnosis not present

## 2014-12-21 ENCOUNTER — Other Ambulatory Visit: Payer: Self-pay | Admitting: Internal Medicine

## 2015-01-15 ENCOUNTER — Ambulatory Visit (INDEPENDENT_AMBULATORY_CARE_PROVIDER_SITE_OTHER): Payer: Medicare Other | Admitting: Internal Medicine

## 2015-01-15 ENCOUNTER — Encounter: Payer: Self-pay | Admitting: Internal Medicine

## 2015-01-15 VITALS — BP 144/78 | HR 64 | Temp 98.0°F | Ht 76.0 in | Wt 261.2 lb

## 2015-01-15 DIAGNOSIS — R05 Cough: Secondary | ICD-10-CM | POA: Diagnosis not present

## 2015-01-15 DIAGNOSIS — J310 Chronic rhinitis: Secondary | ICD-10-CM | POA: Diagnosis not present

## 2015-01-15 DIAGNOSIS — R059 Cough, unspecified: Secondary | ICD-10-CM

## 2015-01-15 MED ORDER — GLUCOSE BLOOD VI STRP: ORAL_STRIP | Status: DC

## 2015-01-15 MED ORDER — AZITHROMYCIN 250 MG PO TABS: ORAL_TABLET | ORAL | Status: DC

## 2015-01-15 NOTE — Progress Notes (Signed)
   Subjective:    Patient ID: Thomas Chung, male    DOB: 1940/12/03, 74 y.o.   MRN: [id number]  HPI   He was exposed to wind and rain 2/24 and 01/14/15. He developed some hoarseness and itchy throat. This resolved with gargling Listerine. He has remained flushed and as of this morning coughed up white phlegm..  He has no other signs of upper respiratory tract infection.  His fasting blood sugars range [id number]. His last A1c was 6.3%  Review of Systems Frontal headache, facial pain , nasal purulence, dental pain, sore throat , otic pain or otic discharge denied. No fever , chills or sweats.    Objective:   Physical Exam  General appearance:Adequately nourished; no acute distress or increased work of breathing is present.  No  lymphadenopathy about the head, neck, or axilla noted.   Eyes: No conjunctival inflammation or lid edema is present. There is no scleral icterus. He has bilateral ptosis.   Ears:  External ear exam shows no significant lesions or deformities.  Otoscopic examination reveals clear canals, tympanic membranes are intact bilaterally without bulging, retraction, inflammation or discharge.  Nose:  External nasal examination shows no deformity or inflammation. There is significant erythema of the nares without exudate or purulence. No septal dislocation or deviation.No obstruction to airflow.   Oral exam: Dental hygiene is good; lips and gums are healthy appearing.There is no oropharyngeal erythema or exudate noted.   Neck:  No deformities, thyromegaly, masses, or tenderness noted.   Supple with full range of motion without pain.   Heart:  Normal rate and regular rhythm. S1 and S2 normal without gallop, murmur, click, rub or other extra sounds.   Lungs:Chest clear to auscultation; no wheezes, rhonchi,rales ,or rubs present.  Extremities:  No cyanosis, edema, or clubbing  noted    Skin: Warm & dry w/o jaundice or tenting.      Assessment & Plan:  #1  nonallergic rhinitis  #2 cough  Plan: Aggressive nasal hygiene. Antibiotics should there be evidence of purulent bronchitis.

## 2015-01-15 NOTE — Progress Notes (Signed)
Pre visit review using our clinic review tool, if applicable. No additional management support is needed unless otherwise documented below in the visit note. 

## 2015-01-15 NOTE — Patient Instructions (Signed)
Plain Mucinex (NOT D) for thick secretions ;force NON dairy fluids .   Nasal cleansing in the shower as discussed with lather of mild shampoo.After 10 seconds wash off lather while  exhaling through nostrils. Make sure that all residual soap is removed to prevent irritation.  Flonase OR Nasacort AQ 1 spray in each nostril twice a day as needed. Use the "crossover" technique into opposite nostril spraying toward opposite ear @ 45 degree angle, not straight up into nostril.  Plain Allegra (NOT D )  160 daily , Loratidine 10 mg , OR Zyrtec 10 mg @ bedtime  as needed for itchy eyes & sneezing. Zicam Melts or Zinc lozenges ; vitamin C 2000 mg daily; & Echinacea for 4-7 days. Venida Jarvis the  prescription for Zithromax it there is not dramatic improvement in the next 48 hours &  fever, exudate("pus") or progressive pain are present.

## 2015-01-21 ENCOUNTER — Ambulatory Visit (INDEPENDENT_AMBULATORY_CARE_PROVIDER_SITE_OTHER): Payer: Medicare Other | Admitting: Internal Medicine

## 2015-01-21 ENCOUNTER — Encounter: Payer: Self-pay | Admitting: Internal Medicine

## 2015-01-21 VITALS — BP 124/68 | HR 79 | Temp 97.5°F | Ht 76.0 in | Wt 258.1 lb

## 2015-01-21 DIAGNOSIS — J310 Chronic rhinitis: Secondary | ICD-10-CM | POA: Diagnosis not present

## 2015-01-21 DIAGNOSIS — J209 Acute bronchitis, unspecified: Secondary | ICD-10-CM | POA: Diagnosis not present

## 2015-01-21 MED ORDER — LANCETS MISC: Status: DC

## 2015-01-21 MED ORDER — AZITHROMYCIN 250 MG PO TABS: ORAL_TABLET | ORAL | Status: DC

## 2015-01-21 MED ORDER — PREDNISONE 20 MG PO TABS: 20.0000 mg | ORAL_TABLET | Freq: Two times a day (BID) | ORAL | Status: DC

## 2015-01-21 NOTE — Patient Instructions (Signed)
Azithromycin 1 daily X 6 days

## 2015-01-21 NOTE — Progress Notes (Signed)
Pre visit review using our clinic review tool, if applicable. No additional management support is needed unless otherwise documented below in the visit note. 

## 2015-01-21 NOTE — Progress Notes (Signed)
   Subjective:    Patient ID: Thomas Chung, male    DOB: November 04, 1941, 74 y.o.   MRN: [id number]  HPI On 2/28 he started the Z-Pak because of the production of yellow sputum. This seemed to increase in volume with Mucinex. With the Z-Pak the sputum has become white. When supine he has noted wheezing. He has continued the nasal hygiene. He continues to have some paranasal pressure.   On 3/1 he was clammy.  He also has had significant fatigue and malaise and even took a 3 hour nap on 2/29.   FBS range 100-130  Review of Systems  He denies fever, chills, frontal headache, facial pain, sore throat, dental pain, otic pain, otic discharge.    Objective:   Physical Exam  General appearance:Adequately nourished; no acute distress or increased work of breathing is present.  No  lymphadenopathy about the head, neck, or axilla noted.   Eyes: No conjunctival inflammation or lid edema is present. There is no scleral icterus.  Ears:  External ear exam shows no significant lesions or deformities.  Otoscopic examination reveals clear canals, tympanic membranes are intact bilaterally without bulging, retraction, inflammation or discharge.  Nose:  External nasal examination shows no deformity or inflammation. Nasal mucosa are pink and moist without lesions or exudates. No septal dislocation or deviation.No obstruction to airflow.   Oral exam: Dental hygiene is good; lips and gums are healthy appearing.There is no oropharyngeal erythema or exudate noted.   Neck:  No deformities, thyromegaly, masses, or tenderness noted.   Supple with full range of motion without pain.   Heart:  Normal rate and regular rhythm. S1 and S2 normal without gallop, murmur, click, rub or other extra sounds.   Lungs:Chest clear to auscultation; no wheezes, rhonchi,rales ,or rubs present.  Extremities:  No cyanosis, edema, or clubbing  noted    Skin: Warm & dry w/o jaundice or tenting.      Assessment & Plan:  #1  acute bronchitis w/o bronchospasm #2 non allergic rhinitis Plan: See orders and recommendations

## 2015-01-22 ENCOUNTER — Ambulatory Visit: Payer: BLUE CROSS/BLUE SHIELD | Admitting: Internal Medicine

## 2015-02-10 ENCOUNTER — Encounter: Payer: Self-pay | Admitting: Internal Medicine

## 2015-02-10 ENCOUNTER — Other Ambulatory Visit (INDEPENDENT_AMBULATORY_CARE_PROVIDER_SITE_OTHER): Payer: Medicare Other

## 2015-02-10 ENCOUNTER — Ambulatory Visit (INDEPENDENT_AMBULATORY_CARE_PROVIDER_SITE_OTHER): Payer: Medicare Other | Admitting: Internal Medicine

## 2015-02-10 VITALS — BP 136/76 | HR 57 | Temp 97.6°F | Resp 16 | Ht 76.0 in | Wt 256.0 lb

## 2015-02-10 DIAGNOSIS — N138 Other obstructive and reflux uropathy: Secondary | ICD-10-CM

## 2015-02-10 DIAGNOSIS — N401 Enlarged prostate with lower urinary tract symptoms: Secondary | ICD-10-CM

## 2015-02-10 DIAGNOSIS — E1140 Type 2 diabetes mellitus with diabetic neuropathy, unspecified: Secondary | ICD-10-CM | POA: Diagnosis not present

## 2015-02-10 DIAGNOSIS — Z0000 Encounter for general adult medical examination without abnormal findings: Secondary | ICD-10-CM | POA: Diagnosis not present

## 2015-02-10 DIAGNOSIS — E785 Hyperlipidemia, unspecified: Secondary | ICD-10-CM | POA: Diagnosis not present

## 2015-02-10 DIAGNOSIS — I10 Essential (primary) hypertension: Secondary | ICD-10-CM

## 2015-02-10 DIAGNOSIS — Z7189 Other specified counseling: Secondary | ICD-10-CM | POA: Insufficient documentation

## 2015-02-10 DIAGNOSIS — E118 Type 2 diabetes mellitus with unspecified complications: Secondary | ICD-10-CM

## 2015-02-10 DIAGNOSIS — N403 Nodular prostate with lower urinary tract symptoms: Secondary | ICD-10-CM

## 2015-02-10 LAB — CBC WITH DIFFERENTIAL/PLATELET
Basophils Absolute: 0 10*3/uL (ref 0.0–0.1)
Basophils Relative: 0.6 % (ref 0.0–3.0)
Eosinophils Absolute: 0.2 10*3/uL (ref 0.0–0.7)
Eosinophils Relative: 3.6 % (ref 0.0–5.0)
HCT: 46.4 % (ref 39.0–52.0)
Hemoglobin: 15.8 g/dL (ref 13.0–17.0)
Lymphocytes Relative: 19.1 % (ref 12.0–46.0)
Lymphs Abs: 1.3 10*3/uL (ref 0.7–4.0)
MCHC: 34.1 g/dL (ref 30.0–36.0)
MCV: 86 fl (ref 78.0–100.0)
Monocytes Absolute: 0.7 10*3/uL (ref 0.1–1.0)
Monocytes Relative: 9.9 % (ref 3.0–12.0)
Neutro Abs: 4.6 10*3/uL (ref 1.4–7.7)
Neutrophils Relative %: 66.8 % (ref 43.0–77.0)
Platelets: 294 10*3/uL (ref 150.0–400.0)
RBC: 5.39 Mil/uL (ref 4.22–5.81)
RDW: 14.8 % (ref 11.5–15.5)
WBC: 6.9 10*3/uL (ref 4.0–10.5)

## 2015-02-10 LAB — LIPID PANEL
Cholesterol: 161 mg/dL (ref 0–200)
HDL: 43.3 mg/dL (ref >39.00)
LDL Cholesterol: 97 mg/dL (ref 0–99)
NonHDL: 117.7
Total CHOL/HDL Ratio: 4
Triglycerides: 102 mg/dL (ref 0.0–149.0)
VLDL: 20.4 mg/dL (ref 0.0–40.0)

## 2015-02-10 LAB — BASIC METABOLIC PANEL
BUN: 30 mg/dL — ABNORMAL HIGH (ref 6–23)
CO2: 31 mEq/L (ref 19–32)
Calcium: 10.1 mg/dL (ref 8.4–10.5)
Chloride: 104 mEq/L (ref 96–112)
Creatinine, Ser: 1.22 mg/dL (ref 0.40–1.50)
GFR: 61.86 mL/min (ref >60.00)
Glucose, Bld: 116 mg/dL — ABNORMAL HIGH (ref 70–99)
Potassium: 4.3 mEq/L (ref 3.5–5.1)
Sodium: 143 mEq/L (ref 135–145)

## 2015-02-10 LAB — URINALYSIS, ROUTINE W REFLEX MICROSCOPIC
Bilirubin Urine: NEGATIVE
Hgb urine dipstick: NEGATIVE
Ketones, ur: NEGATIVE
Leukocytes, UA: NEGATIVE
Nitrite: NEGATIVE
RBC / HPF: NONE SEEN (ref 0–?)
Specific Gravity, Urine: 1.025 (ref 1.000–1.030)
Total Protein, Urine: NEGATIVE
Urine Glucose: NEGATIVE
Urobilinogen, UA: 0.2 (ref 0.0–1.0)
WBC, UA: NONE SEEN (ref 0–?)
pH: 5.5 (ref 5.0–8.0)

## 2015-02-10 LAB — FECAL OCCULT BLOOD, GUAIAC: Fecal Occult Blood: NEGATIVE

## 2015-02-10 LAB — MICROALBUMIN / CREATININE URINE RATIO
Creatinine,U: 99.5 mg/dL
Microalb Creat Ratio: 0.9 mg/g (ref 0.0–30.0)
Microalb, Ur: 0.9 mg/dL (ref 0.0–1.9)

## 2015-02-10 LAB — TSH: TSH: 1.83 u[IU]/mL (ref 0.35–4.50)

## 2015-02-10 LAB — PSA: PSA: 0.12 ng/mL (ref 0.10–4.00)

## 2015-02-10 LAB — HEMOGLOBIN A1C: Hgb A1c MFr Bld: 6.6 % — ABNORMAL HIGH (ref 4.6–6.5)

## 2015-02-10 NOTE — Progress Notes (Signed)
   Subjective:    Patient ID: Thomas Chung, male    DOB: 1941-10-22, 74 y.o.   MRN: [id number]  Hypertension This is a chronic problem. The current episode started more than 1 year ago. The problem has been gradually improving since onset. The problem is controlled. Pertinent negatives include no anxiety, blurred vision, chest pain, headaches, malaise/fatigue, neck pain, orthopnea, palpitations, peripheral edema, PND, shortness of breath or sweats. Past treatments include beta blockers, angiotensin blockers and diuretics. The current treatment provides moderate improvement. There are no compliance problems.  Hypertensive end-organ damage includes CAD/MI.      Review of Systems  Constitutional: Negative.  Negative for fever, chills, malaise/fatigue, diaphoresis, appetite change and fatigue.  HENT: Negative.   Eyes: Negative.  Negative for blurred vision.  Respiratory: Negative.  Negative for cough, choking, chest tightness, shortness of breath and stridor.   Cardiovascular: Negative.  Negative for chest pain, palpitations, orthopnea, leg swelling and PND.  Gastrointestinal: Negative.  Negative for abdominal pain.  Endocrine: Negative.   Genitourinary: Negative.   Musculoskeletal: Negative.  Negative for neck pain.  Skin: Negative.   Allergic/Immunologic: Negative.   Neurological: Negative.  Negative for dizziness, tremors, weakness and headaches.  Hematological: Negative.  Negative for adenopathy. Does not bruise/bleed easily.  Psychiatric/Behavioral: Negative.        Objective:   Physical Exam  Constitutional: He is oriented to person, place, and time. He appears well-developed and well-nourished. No distress.  HENT:  Head: Normocephalic and atraumatic.  Mouth/Throat: Oropharynx is clear and moist. No oropharyngeal exudate.  Eyes: Conjunctivae are normal. Right eye exhibits no discharge. Left eye exhibits no discharge. No scleral icterus.  Neck: Normal range of motion. Neck  supple. No JVD present. No tracheal deviation present. No thyromegaly present.  Cardiovascular: Normal rate, regular rhythm, normal heart sounds and intact distal pulses.  Exam reveals no gallop and no friction rub.   No murmur heard. Pulmonary/Chest: Effort normal and breath sounds normal. No stridor. No respiratory distress. He has no wheezes. He has no rales. He exhibits no tenderness.  Abdominal: Soft. Bowel sounds are normal. He exhibits no distension and no mass. There is no tenderness. There is no rebound and no guarding. Hernia confirmed negative in the right inguinal area and confirmed negative in the left inguinal area.  Genitourinary: Rectum normal, testes normal and penis normal. Rectal exam shows no external hemorrhoid, no internal hemorrhoid, no fissure, no mass, no tenderness and anal tone normal. Guaiac negative stool. Prostate is enlarged (small nodule over the left lobe). Prostate is not tender. Right testis shows no mass, no swelling and no tenderness. Right testis is descended. Left testis shows no mass, no swelling and no tenderness. Left testis is descended. Circumcised. No penile erythema or penile tenderness. No discharge found.  Musculoskeletal: Normal range of motion. He exhibits no edema or tenderness.  Lymphadenopathy:    He has no cervical adenopathy.       Right: No inguinal adenopathy present.       Left: No inguinal adenopathy present.  Neurological: He is oriented to person, place, and time.  Skin: Skin is warm and dry. No rash noted. He is not diaphoretic. No erythema. No pallor.  Psychiatric: He has a normal mood and affect. His behavior is normal. Judgment and thought content normal.  Vitals reviewed.         Assessment & Plan:

## 2015-02-10 NOTE — Assessment & Plan Note (Signed)
He has controlled his blood sugars well without meds Will recheck his A1C and will treat if indicated

## 2015-02-10 NOTE — Patient Instructions (Signed)

## 2015-02-10 NOTE — Assessment & Plan Note (Signed)
He is doing well on lipitor Will recheck his FLP May add zetia if LDL is not at goal

## 2015-02-10 NOTE — Assessment & Plan Note (Signed)
His BP is well controlled Will monitor his lytes and renal function 

## 2015-02-10 NOTE — Progress Notes (Signed)
Pre visit review using our clinic review tool, if applicable. No additional management support is needed unless otherwise documented below in the visit note. 

## 2015-02-10 NOTE — Assessment & Plan Note (Signed)
His exam is unchanged Will check his PSA to screen for prostate cancer

## 2015-02-11 DIAGNOSIS — I739 Peripheral vascular disease, unspecified: Secondary | ICD-10-CM | POA: Diagnosis not present

## 2015-02-11 DIAGNOSIS — L603 Nail dystrophy: Secondary | ICD-10-CM | POA: Diagnosis not present

## 2015-02-11 DIAGNOSIS — E1151 Type 2 diabetes mellitus with diabetic peripheral angiopathy without gangrene: Secondary | ICD-10-CM | POA: Diagnosis not present

## 2015-02-11 DIAGNOSIS — L84 Corns and callosities: Secondary | ICD-10-CM | POA: Diagnosis not present

## 2015-02-18 ENCOUNTER — Other Ambulatory Visit: Payer: Self-pay | Admitting: Internal Medicine

## 2015-03-11 ENCOUNTER — Encounter: Payer: Self-pay | Admitting: Cardiology

## 2015-04-22 DIAGNOSIS — E1151 Type 2 diabetes mellitus with diabetic peripheral angiopathy without gangrene: Secondary | ICD-10-CM | POA: Diagnosis not present

## 2015-04-22 DIAGNOSIS — L603 Nail dystrophy: Secondary | ICD-10-CM | POA: Diagnosis not present

## 2015-04-22 DIAGNOSIS — I739 Peripheral vascular disease, unspecified: Secondary | ICD-10-CM | POA: Diagnosis not present

## 2015-06-09 ENCOUNTER — Other Ambulatory Visit: Payer: Self-pay | Admitting: Internal Medicine

## 2015-06-09 ENCOUNTER — Encounter: Payer: Self-pay | Admitting: Internal Medicine

## 2015-06-09 ENCOUNTER — Other Ambulatory Visit (INDEPENDENT_AMBULATORY_CARE_PROVIDER_SITE_OTHER): Payer: Medicare Other

## 2015-06-09 ENCOUNTER — Ambulatory Visit (INDEPENDENT_AMBULATORY_CARE_PROVIDER_SITE_OTHER): Payer: Medicare Other | Admitting: Internal Medicine

## 2015-06-09 VITALS — BP 112/68 | HR 69 | Temp 97.5°F | Resp 16 | Ht 76.0 in | Wt 259.0 lb

## 2015-06-09 DIAGNOSIS — I10 Essential (primary) hypertension: Secondary | ICD-10-CM

## 2015-06-09 DIAGNOSIS — R197 Diarrhea, unspecified: Secondary | ICD-10-CM

## 2015-06-09 DIAGNOSIS — E118 Type 2 diabetes mellitus with unspecified complications: Secondary | ICD-10-CM

## 2015-06-09 LAB — BASIC METABOLIC PANEL
BUN: 24 mg/dL — ABNORMAL HIGH (ref 6–23)
CO2: 26 mEq/L (ref 19–32)
Calcium: 9.7 mg/dL (ref 8.4–10.5)
Chloride: 107 mEq/L (ref 96–112)
Creatinine, Ser: 1.12 mg/dL (ref 0.40–1.50)
GFR: 68.21 mL/min (ref >60.00)
Glucose, Bld: 112 mg/dL — ABNORMAL HIGH (ref 70–99)
Potassium: 3.9 mEq/L (ref 3.5–5.1)
Sodium: 142 mEq/L (ref 135–145)

## 2015-06-09 LAB — CBC WITH DIFFERENTIAL/PLATELET
Basophils Absolute: 0 10*3/uL (ref 0.0–0.1)
Basophils Relative: 0.5 % (ref 0.0–3.0)
Eosinophils Absolute: 0.3 10*3/uL (ref 0.0–0.7)
Eosinophils Relative: 3.5 % (ref 0.0–5.0)
HCT: 47.1 % (ref 39.0–52.0)
Hemoglobin: 15.6 g/dL (ref 13.0–17.0)
Lymphocytes Relative: 16.2 % (ref 12.0–46.0)
Lymphs Abs: 1.4 10*3/uL (ref 0.7–4.0)
MCHC: 33 g/dL (ref 30.0–36.0)
MCV: 86.8 fl (ref 78.0–100.0)
Monocytes Absolute: 0.8 10*3/uL (ref 0.1–1.0)
Monocytes Relative: 9.6 % (ref 3.0–12.0)
Neutro Abs: 5.9 10*3/uL (ref 1.4–7.7)
Neutrophils Relative %: 70.2 % (ref 43.0–77.0)
Platelets: 337 10*3/uL (ref 150.0–400.0)
RBC: 5.43 Mil/uL (ref 4.22–5.81)
RDW: 15.2 % (ref 11.5–15.5)
WBC: 8.3 10*3/uL (ref 4.0–10.5)

## 2015-06-09 LAB — SEDIMENTATION RATE: Sed Rate: 3 mm/hr (ref 0–22)

## 2015-06-09 LAB — HEMOGLOBIN A1C: Hgb A1c MFr Bld: 6.2 % (ref 4.6–6.5)

## 2015-06-09 NOTE — Progress Notes (Signed)
Subjective:  Patient ID: Thomas Chung, male    DOB: 01/02/1941  Age: 74 y.o. MRN: [id number]  CC: Diabetes and Diarrhea   HPI ISHMAIL MCMANAMON presents for for follow-up on diabetes but he also complains of a 2 week history of loose stools. He says he is having about 3-4 loose bowel movements per day. He thinks it's because his he's been eating a lot of fruit. His also had a little bit of weight gain. He offers no other complaints.  Outpatient Prescriptions Prior to Visit  Medication Sig Dispense Refill  . aspirin 81 MG tablet Take 81 mg by mouth daily.      Marland Kitchen atorvastatin (LIPITOR) 40 MG tablet TAKE 1 TABLET BY MOUTH EVERY DAY 90 tablet 3  . COD LIVER OIL PO Take by mouth.    Marland Kitchen glucose blood (BAYER CONTOUR NEXT TEST) test strip Use to test blood sugar once daily ICD 10 code E11 8 100 each 3  . Lancets MISC Test twice daily (Patient taking differently: Test once daily) 100 each 11  . losartan-hydrochlorothiazide (HYZAAR) 100-12.5 MG per tablet TAKE 1 TABLET EVERY DAY FOR HYPERTENSION 90 tablet 1  . metoprolol succinate (TOPROL-XL) 50 MG 24 hr tablet TAKE 1 TABLET BY MOUTH ONCE DAILY 90 tablet 3  . L-Methylfolate-Algae-B12-B6 (METANX) 3-90.314-2-35 MG CAPS Take 1 capsule by mouth 2 (two) times daily. (Patient not taking: Reported on 06/09/2015) 180 capsule 3   No facility-administered medications prior to visit.    ROS Review of Systems  Constitutional: Negative.  Negative for fever, chills, diaphoresis, activity change, appetite change, fatigue and unexpected weight change.  HENT: Negative.  Negative for trouble swallowing and voice change.   Eyes: Negative.   Respiratory: Negative.  Negative for cough, choking, chest tightness, shortness of breath and stridor.   Cardiovascular: Negative.  Negative for chest pain, palpitations and leg swelling.  Gastrointestinal: Positive for diarrhea. Negative for nausea, vomiting, abdominal pain, constipation, blood in stool, abdominal  distention, anal bleeding and rectal pain.  Endocrine: Negative.  Negative for polydipsia, polyphagia and polyuria.  Genitourinary: Negative.   Musculoskeletal: Negative.   Skin: Negative.   Allergic/Immunologic: Negative.   Neurological: Negative.   Hematological: Negative.  Negative for adenopathy. Does not bruise/bleed easily.  Psychiatric/Behavioral: Negative.     Objective:  BP 112/68 mmHg  Pulse 69  Temp(Src) 97.5 F (36.4 C) (Oral)  Resp 16  Ht 6\' 4"  (1.93 m)  Wt 259 lb (117.482 kg)  BMI 31.54 kg/m2  SpO2 95%  BP Readings from Last 3 Encounters:  06/09/15 112/68  02/10/15 136/76  01/21/15 124/68    Wt Readings from Last 3 Encounters:  06/09/15 259 lb (117.482 kg)  02/10/15 256 lb (116.121 kg)  01/21/15 258 lb 0.8 oz (117.051 kg)    Physical Exam  Constitutional: He is oriented to person, place, and time.  Non-toxic appearance. He does not have a sickly appearance. He does not appear ill. No distress.  HENT:  Mouth/Throat: Oropharynx is clear and moist. No oropharyngeal exudate.  Eyes: Conjunctivae are normal. Right eye exhibits no discharge. Left eye exhibits no discharge. No scleral icterus.  Neck: Normal range of motion. Neck supple. No JVD present. No tracheal deviation present. No thyromegaly present.  Cardiovascular: Normal rate, regular rhythm, normal heart sounds and intact distal pulses.  Exam reveals no gallop and no friction rub.   No murmur heard. Pulmonary/Chest: Effort normal and breath sounds normal. No stridor. No respiratory distress. He has no wheezes. He  has no rales. He exhibits no tenderness.  Abdominal: Soft. Bowel sounds are normal. He exhibits no distension and no mass. There is no tenderness. There is no rebound and no guarding.  Musculoskeletal: Normal range of motion. He exhibits no edema or tenderness.  Lymphadenopathy:    He has no cervical adenopathy.  Neurological: He is oriented to person, place, and time.  Skin: Skin is warm and  dry. No rash noted. He is not diaphoretic. No erythema. No pallor.  Psychiatric: He has a normal mood and affect. His behavior is normal. Judgment and thought content normal.  Vitals reviewed.   Lab Results  Component Value Date   WBC 8.3 06/09/2015   HGB 15.6 06/09/2015   HCT 47.1 06/09/2015   PLT 337.0 06/09/2015   GLUCOSE 112* 06/09/2015   CHOL 161 02/10/2015   TRIG 102.0 02/10/2015   HDL 43.30 02/10/2015   LDLCALC 97 02/10/2015   ALT 28 12/10/2013   AST 26 12/10/2013   NA 142 06/09/2015   K 3.9 06/09/2015   CL 107 06/09/2015   CREATININE 1.12 06/09/2015   BUN 24* 06/09/2015   CO2 26 06/09/2015   TSH 1.83 02/10/2015   PSA 0.12 02/10/2015   HGBA1C 6.2 06/09/2015   MICROALBUR 0.9 02/10/2015    No results found.  Assessment & Plan:   Cartier was seen today for diabetes and diarrhea.  Diagnoses and all orders for this visit:  Type II diabetes mellitus with manifestations- his blood sugars are well controlled.- Orders: -     Basic metabolic panel; Future -     Hemoglobin A1c; Future  Essential hypertension, benign- his blood pressure is well-controlled, lites and renal function are stable. Orders: -     Basic metabolic panel; Future  Diarrhea- he does not appear to be toxic and his white blood cell count is normal. He will decrease his fruit intake and see if the diarrhea resolves. I have asked him to submit a stool specimen to check his stool studies for infection and inflammation. Orders: -     CBC with Differential/Platelet; Future -     Sedimentation rate; Future -     Fecal lactoferrin; Future -     Clostridium difficile EIA; Future -     Giardia/cryptosporidium (EIA); Future -     Ova and parasite examination; Future   I have discontinued Mr. Goodspeed's [place]. I am also having him maintain his aspirin, COD LIVER OIL PO, atorvastatin, losartan-hydrochlorothiazide, glucose blood, Lancets, metoprolol succinate, and Benfotiamine.  Meds ordered this encounter    Medications  . Benfotiamine 150 MG CAPS    Sig: Take by mouth 2 (two) times daily.     Follow-up: Return in about 3 weeks (around 06/30/2015).  Scarlette Calico, MD

## 2015-06-09 NOTE — Patient Instructions (Signed)

## 2015-06-09 NOTE — Progress Notes (Signed)
Pre visit review using our clinic review tool, if applicable. No additional management support is needed unless otherwise documented below in the visit note. 

## 2015-06-09 NOTE — Progress Notes (Signed)
Cardiology Office Note   Date:  06/10/2015   ID:  Thomas Chung, DOB Jul 30, 1941, MRN [id number]  PCP:  Thomas Calico, MD    Chief Complaint  Patient presents with  . Follow-up    family history of early CAD      History of Present Illness: Thomas Chung is a 74 y.o. male with a history of HTN and dyslipidemia.  He denies any chest pain or pressure, DOE or SOB. He denies any LE edema, palpitations or syncope.He says that he does aerobics 3 days weekly and Tai Chi twice weekly. He also walks and rides a bike as well as swims.He does have a family history of CAD and sees me yearly for evaluation.   Past Medical History  Diagnosis Date  . Depression   . Hypertension   . Hyperlipidemia   . Fatty liver   . Hx of colonic polyps   . Neuromuscular disorder     neuropathy feet  . HOH (hard of hearing)     bilateral hearing aides  . Glucose intolerance (impaired glucose tolerance)     Past Surgical History  Procedure Laterality Date  . Vasectomy    . Tibia fracture surgery  2001    right with hardware  . Wrist fracture surgery  2002    right  . Colonoscopy  2006, 2009, 07/28/2011    2006 12 and 7 mm TVadenoma and adenoma 2009: 3 small adenomas 2012,:36m rectal polyp  . Colonoscopy    . Orif finger fracture  02/08/2012    Procedure: OPEN REDUCTION INTERNAL FIXATION (ORIF) METACARPAL (FINGER) FRACTURE;  Surgeon: RCammie Sickle, MD;  Location: [facility]  Service: Orthopedics;  Laterality: Right;  right small finger middle phalanx  . Squamous cell carcinoma excision       Current Outpatient Prescriptions  Medication Sig Dispense Refill  . aspirin 81 MG tablet Take 81 mg by mouth daily.      .Marland Kitchenatorvastatin (LIPITOR) 40 MG tablet TAKE 1 TABLET BY MOUTH EVERY DAY 90 tablet 3  . Benfotiamine 150 MG CAPS Take by mouth 2 (two) times daily.    . COD LIVER OIL PO Take by mouth.    .Marland Kitchenglucose blood (BAYER CONTOUR NEXT TEST) test strip Use  to test blood sugar once daily ICD 10 code E11 8 100 each 3  . Lancets MISC Test twice daily 100 each 11  . losartan-hydrochlorothiazide (HYZAAR) 100-12.5 MG per tablet TAKE 1 TABLET EVERY DAY FOR HYPERTENSION 90 tablet 1  . metoprolol succinate (TOPROL-XL) 50 MG 24 hr tablet TAKE 1 TABLET BY MOUTH ONCE DAILY 90 tablet 3   No current facility-administered medications for this visit.    Allergies:   Prevnar    Social History:  The patient  reports that he has quit smoking. He has never used smokeless tobacco. He reports that he drinks about 1.8 oz of alcohol per week. He reports that he does not use illicit drugs.   Family History:  The patient's family history includes Alcohol abuse in an other family member; Arrhythmia in his mother; Coronary artery disease in an other family member; Diabetes in an other family member; Heart attack in his mother; Heart disease in his mother; Heart failure in his father. There is no history of Cancer, Stroke, Hyperlipidemia, Hypertension, or Kidney disease.    ROS:  Please see the history of  present illness.   Otherwise, review of systems are positive for none.   All other systems are reviewed and negative.    PHYSICAL EXAM: VS:  BP 120/60 mmHg  Pulse 68  Ht _0  (1.93 m)  Wt 259 lb 6.4 oz (117.663 kg)  BMI 31.59 kg/m2 , BMI Body mass index is 31.59 kg/(m^2). GEN: Well nourished, well developed, in no acute distress HEENT: normal Neck: no JVD, carotid bruits, or masses Cardiac: RRR; no murmurs, rubs, or gallops,no edema  Respiratory:  clear to auscultation bilaterally, normal work of breathing GI: soft, nontender, nondistended, + BS MS: no deformity or atrophy Skin: warm and dry, no rash Neuro:  Strength and sensation are intact Psych: euthymic mood, full affect   EKG:  EKG is ordered today. The ekg ordered today demonstrates NSR at 69 bpm with no ST changes   Recent Labs: 02/10/2015: TSH 1.83 06/09/2015: BUN 24*; Creatinine, Ser 1.12;  Hemoglobin 15.6; Platelets 337.0; Potassium 3.9; Sodium 142    Lipid Panel    Component Value Date/Time   CHOL 161 02/10/2015 0909   TRIG 102.0 02/10/2015 0909   HDL 43.30 02/10/2015 0909   CHOLHDL 4 02/10/2015 0909   VLDL 20.4 02/10/2015 0909   LDLCALC 97 02/10/2015 0909      Wt Readings from Last 3 Encounters:  06/10/15 259 lb 6.4 oz (117.663 kg)  06/09/15 259 lb (117.482 kg)  02/10/15 256 lb (116.121 kg)      ASSESSMENT AND PLAN:  1. Dyslipidemia - followed by PCP - continue statin. I have encouraged him to increase his aerobic exercise to lose some weight 2. HTN - controlled on Toprol and Hyzaar 3. Family history of CAD at an early age - He is completely asymptomatic. I will set him up for an ETT to rule out ischemia for yearly followup of cardiac risk factors.    Current medicines are reviewed at length with the patient today.  The patient does not have concerns regarding medicines.  The following changes have been made:  no change  Labs/ tests ordered today: See above Assessment and Plan No orders of the defined types were placed in this encounter.     Disposition:   FU with me in 1 year  Signed, Sueanne [name], MD  06/10/2015 3:02 PM    [facility] [address], [place], [place]  [address] Phone: [phone]; Fax: [phone]

## 2015-06-10 ENCOUNTER — Encounter: Payer: Self-pay | Admitting: Cardiology

## 2015-06-10 ENCOUNTER — Ambulatory Visit (INDEPENDENT_AMBULATORY_CARE_PROVIDER_SITE_OTHER): Payer: Medicare Other | Admitting: Cardiology

## 2015-06-10 VITALS — BP 120/60 | HR 68 | Ht 76.0 in | Wt 259.4 lb

## 2015-06-10 DIAGNOSIS — E785 Hyperlipidemia, unspecified: Secondary | ICD-10-CM | POA: Diagnosis not present

## 2015-06-10 DIAGNOSIS — Z8249 Family history of ischemic heart disease and other diseases of the circulatory system: Secondary | ICD-10-CM

## 2015-06-10 DIAGNOSIS — I10 Essential (primary) hypertension: Secondary | ICD-10-CM

## 2015-06-10 LAB — C. DIFFICILE GDH AND TOXIN A/B
C. difficile GDH: NOT DETECTED
C. difficile Toxin A/B: NOT DETECTED

## 2015-06-10 LAB — GIARDIA/CRYPTOSPORIDIUM (EIA)
Cryptosporidium Screen (EIA): NEGATIVE
Giardia Screen (EIA): NEGATIVE

## 2015-06-10 LAB — OVA AND PARASITE EXAMINATION: OP: NONE SEEN

## 2015-06-10 NOTE — Patient Instructions (Signed)
Medication Instructions:  Your physician recommends that you continue on your current medications as directed. Please refer to the Current Medication list given to you today.   Labwork: None  Testing/Procedures: Your physician has requested that you have an exercise tolerance test. For further information please visit [email or web address]. Please also follow instruction sheet, as given.  Follow-Up: Your physician wants you to follow-up in: 1 year with Dr. Radford Pax. You will receive a reminder letter in the mail two months in advance. If you don't receive a letter, please call our office to schedule the follow-up appointment.   Any Other Special Instructions Will Be Listed Below (If Applicable).

## 2015-06-11 ENCOUNTER — Encounter: Payer: Self-pay | Admitting: Internal Medicine

## 2015-06-11 LAB — FECAL LACTOFERRIN, QUANT: Lactoferrin: POSITIVE

## 2015-06-14 ENCOUNTER — Other Ambulatory Visit: Payer: Self-pay

## 2015-06-14 MED ORDER — LOSARTAN POTASSIUM-HCTZ 100-12.5 MG PO TABS: ORAL_TABLET | ORAL | Status: DC

## 2015-06-30 ENCOUNTER — Encounter: Payer: Self-pay | Admitting: Internal Medicine

## 2015-06-30 ENCOUNTER — Other Ambulatory Visit: Payer: Medicare Other

## 2015-06-30 ENCOUNTER — Ambulatory Visit (INDEPENDENT_AMBULATORY_CARE_PROVIDER_SITE_OTHER): Payer: Medicare Other | Admitting: Internal Medicine

## 2015-06-30 ENCOUNTER — Other Ambulatory Visit: Payer: Self-pay | Admitting: Internal Medicine

## 2015-06-30 VITALS — BP 138/84 | HR 60 | Temp 97.9°F | Resp 16 | Ht 76.0 in | Wt 261.0 lb

## 2015-06-30 DIAGNOSIS — R197 Diarrhea, unspecified: Secondary | ICD-10-CM

## 2015-06-30 NOTE — Patient Instructions (Signed)

## 2015-06-30 NOTE — Progress Notes (Signed)
Subjective:  Patient ID: Thomas Chung, male    DOB: 02-Jun-1941  Age: 74 y.o. MRN: [id number]  CC: Diarrhea   HPI Thomas Chung presents for follow-up on recent episode of diarrhea. He reports that he has had a contact with someone who had viral gastroenteritis. He reports today that he is feeling better. He is only having 2 bowel movements per day and he describes them as mushy and smeared. His stool studies were negative with the exception of lactoferrin which was positive.  Outpatient Prescriptions Prior to Visit  Medication Sig Dispense Refill  . aspirin 81 MG tablet Take 81 mg by mouth daily.      Marland Kitchen atorvastatin (LIPITOR) 40 MG tablet TAKE 1 TABLET BY MOUTH EVERY DAY 90 tablet 3  . Benfotiamine 150 MG CAPS Take by mouth 2 (two) times daily.    . COD LIVER OIL PO Take by mouth.    Marland Kitchen glucose blood (BAYER CONTOUR NEXT TEST) test strip Use to test blood sugar once daily ICD 10 code E11 8 100 each 3  . Lancets MISC Test twice daily 100 each 11  . losartan-hydrochlorothiazide (HYZAAR) 100-12.5 MG per tablet TAKE 1 TABLET EVERY DAY FOR HYPERTENSION 90 tablet 1  . metoprolol succinate (TOPROL-XL) 50 MG 24 hr tablet TAKE 1 TABLET BY MOUTH ONCE DAILY 90 tablet 3   No facility-administered medications prior to visit.    ROS Review of Systems  Constitutional: Negative.  Negative for fever, chills, diaphoresis, activity change, appetite change, fatigue and unexpected weight change.  HENT: Negative.  Negative for trouble swallowing and voice change.   Eyes: Negative.   Respiratory: Negative.  Negative for cough, choking, chest tightness, shortness of breath and stridor.   Cardiovascular: Negative.  Negative for chest pain, palpitations and leg swelling.  Gastrointestinal: Positive for diarrhea. Negative for nausea, vomiting, abdominal pain, constipation, blood in stool and rectal pain.  Endocrine: Negative.   Genitourinary: Negative.  Negative for difficulty urinating.    Musculoskeletal: Negative.   Skin: Negative.  Negative for rash.  Allergic/Immunologic: Negative.   Neurological: Negative.  Negative for dizziness, syncope, speech difficulty, weakness, light-headedness, numbness and headaches.  Hematological: Negative.  Negative for adenopathy. Does not bruise/bleed easily.  Psychiatric/Behavioral: Negative.     Objective:  BP 138/84 mmHg  Pulse 56  Temp(Src) 97.6 F (36.4 C) (Oral)  Resp 16  Ht 6\' 4"  (1.93 m)  Wt 261 lb (118.389 kg)  BMI 31.78 kg/m2  SpO2 97%  BP Readings from Last 3 Encounters:  06/30/15 138/84  06/10/15 120/60  06/09/15 112/68    Wt Readings from Last 3 Encounters:  06/30/15 261 lb (118.389 kg)  06/10/15 259 lb 6.4 oz (117.663 kg)  06/09/15 259 lb (117.482 kg)    Physical Exam  Constitutional: He is oriented to person, place, and time.  Non-toxic appearance. He does not have a sickly appearance. He does not appear ill. No distress.  HENT:  Mouth/Throat: Oropharynx is clear and moist. No oropharyngeal exudate.  Eyes: Conjunctivae are normal. Right eye exhibits no discharge. Left eye exhibits no discharge. No scleral icterus.  Neck: Normal range of motion. Neck supple. No JVD present. No tracheal deviation present. No thyromegaly present.  Cardiovascular: Normal rate, regular rhythm, normal heart sounds and intact distal pulses.  Exam reveals no gallop and no friction rub.   No murmur heard. Pulmonary/Chest: Effort normal and breath sounds normal. No stridor. No respiratory distress. He has no wheezes. He has no rales. He exhibits  no tenderness.  Abdominal: Soft. Normal appearance and bowel sounds are normal. He exhibits no distension and no mass. There is no hepatosplenomegaly or splenomegaly. There is no tenderness. There is no rebound, no guarding and no CVA tenderness.  Musculoskeletal: Normal range of motion. He exhibits no edema or tenderness.  Lymphadenopathy:    He has no cervical adenopathy.  Neurological: He  is oriented to person, place, and time.  Skin: Skin is warm and dry. No rash noted. He is not diaphoretic. No erythema. No pallor.  Psychiatric: He has a normal mood and affect. His behavior is normal. Judgment and thought content normal.  Vitals reviewed.   Lab Results  Component Value Date   WBC 8.3 06/09/2015   HGB 15.6 06/09/2015   HCT 47.1 06/09/2015   PLT 337.0 06/09/2015   GLUCOSE 112* 06/09/2015   CHOL 161 02/10/2015   TRIG 102.0 02/10/2015   HDL 43.30 02/10/2015   LDLCALC 97 02/10/2015   ALT 28 12/10/2013   AST 26 12/10/2013   NA 142 06/09/2015   K 3.9 06/09/2015   CL 107 06/09/2015   CREATININE 1.12 06/09/2015   BUN 24* 06/09/2015   CO2 26 06/09/2015   TSH 1.83 02/10/2015   PSA 0.12 02/10/2015   HGBA1C 6.2 06/09/2015   MICROALBUR 0.9 02/10/2015   Results for Thomas Chung, Thomas Chung (MRN [id number]) as of 06/30/2015 11:57  Ref. Range 06/09/2015 12:22  Giardia Screen (EIA) Unknown NEGATIVE  GIARDIA/CRYPTOSPORIDIUM (EIA) Unknown Rpt  Cryptosporidium Screen (EIA) Unknown NEGATIVE  OP Unknown No Ova or Parasit...  OVA AND PARASITE EXAMINATION Unknown Rpt    No results found.  Assessment & Plan:   Thomas Chung was seen today for diarrhea.  Diagnoses and all orders for this visit:  Diarrhea- improvement noted. I will recheck his lactoferrin level and if that remains positive will consider referral to GI for possible lower endoscopy. I will also check him for C. difficile infection again. -     Fecal lactoferrin; Future -     Clostridium difficile EIA; Future   I am having Thomas Chung maintain his aspirin, COD LIVER OIL PO, atorvastatin, glucose blood, Lancets, metoprolol succinate, Benfotiamine, and losartan-hydrochlorothiazide.  No orders of the defined types were placed in this encounter.     Follow-up: Return if symptoms worsen or fail to improve.  Scarlette Calico, MD

## 2015-06-30 NOTE — Progress Notes (Signed)
Pre visit review using our clinic review tool, if applicable. No additional management support is needed unless otherwise documented below in the visit note. 

## 2015-07-01 ENCOUNTER — Encounter (HOSPITAL_COMMUNITY): Payer: Medicare Other

## 2015-07-01 ENCOUNTER — Encounter: Payer: Self-pay | Admitting: Internal Medicine

## 2015-07-01 LAB — C. DIFFICILE GDH AND TOXIN A/B
C. difficile GDH: NOT DETECTED
C. difficile Toxin A/B: NOT DETECTED

## 2015-07-01 LAB — FECAL LACTOFERRIN, QUANT: Lactoferrin: NEGATIVE

## 2015-07-06 ENCOUNTER — Telehealth (HOSPITAL_COMMUNITY): Payer: Self-pay

## 2015-07-06 NOTE — Telephone Encounter (Signed)
Encounter complete. 

## 2015-07-07 ENCOUNTER — Telehealth (HOSPITAL_COMMUNITY): Payer: Self-pay

## 2015-07-07 NOTE — Telephone Encounter (Signed)
Encounter complete. 

## 2015-07-08 ENCOUNTER — Ambulatory Visit (HOSPITAL_COMMUNITY)
Admission: RE | Admit: 2015-07-08 | Discharge: 2015-07-08 | Disposition: A | Discharge status: Home / Self Care | Payer: Medicare Other | Source: Ambulatory Visit | Attending: Cardiology | Admitting: Cardiology

## 2015-07-08 DIAGNOSIS — Z8249 Family history of ischemic heart disease and other diseases of the circulatory system: Secondary | ICD-10-CM | POA: Diagnosis not present

## 2015-07-08 LAB — EXERCISE TOLERANCE TEST
Estimated workload: 12.6 METS
Exercise duration (min): 10 min
Exercise duration (sec): 31 s
MPHR: 147 {beats}/min
Peak HR: 150 {beats}/min
Percent HR: 102 %
RPE: 16
Rest HR: 86 {beats}/min

## 2015-07-23 ENCOUNTER — Ambulatory Visit (INDEPENDENT_AMBULATORY_CARE_PROVIDER_SITE_OTHER): Payer: Medicare Other

## 2015-07-23 DIAGNOSIS — Z23 Encounter for immunization: Secondary | ICD-10-CM | POA: Diagnosis not present

## 2015-08-12 ENCOUNTER — Other Ambulatory Visit: Payer: Self-pay

## 2015-08-12 MED ORDER — ATORVASTATIN CALCIUM 40 MG PO TABS: ORAL_TABLET | ORAL | Status: DC

## 2015-08-19 DIAGNOSIS — L603 Nail dystrophy: Secondary | ICD-10-CM | POA: Diagnosis not present

## 2015-08-19 DIAGNOSIS — E1151 Type 2 diabetes mellitus with diabetic peripheral angiopathy without gangrene: Secondary | ICD-10-CM | POA: Diagnosis not present

## 2015-08-19 DIAGNOSIS — I739 Peripheral vascular disease, unspecified: Secondary | ICD-10-CM | POA: Diagnosis not present

## 2015-08-26 ENCOUNTER — Telehealth: Payer: Self-pay | Admitting: Family Medicine

## 2015-08-26 ENCOUNTER — Ambulatory Visit
Admission: RE | Admit: 2015-08-26 | Discharge: 2015-08-26 | Disposition: A | Discharge status: Home / Self Care | Payer: Medicare Other | Source: Ambulatory Visit | Attending: Family Medicine | Admitting: Family Medicine

## 2015-08-26 ENCOUNTER — Encounter: Payer: Self-pay | Admitting: Family Medicine

## 2015-08-26 ENCOUNTER — Ambulatory Visit (INDEPENDENT_AMBULATORY_CARE_PROVIDER_SITE_OTHER): Payer: Medicare Other | Admitting: Family Medicine

## 2015-08-26 VITALS — BP 122/84 | HR 77 | Temp 97.6°F | Ht 76.0 in | Wt 253.0 lb

## 2015-08-26 DIAGNOSIS — R6883 Chills (without fever): Secondary | ICD-10-CM | POA: Diagnosis not present

## 2015-08-26 DIAGNOSIS — R9389 Abnormal findings on diagnostic imaging of other specified body structures: Secondary | ICD-10-CM

## 2015-08-26 DIAGNOSIS — R509 Fever, unspecified: Secondary | ICD-10-CM | POA: Diagnosis not present

## 2015-08-26 DIAGNOSIS — J929 Pleural plaque without asbestos: Secondary | ICD-10-CM

## 2015-08-26 DIAGNOSIS — R05 Cough: Secondary | ICD-10-CM | POA: Diagnosis not present

## 2015-08-26 LAB — POCT INFLUENZA A/B: Influenza A, POC: NEGATIVE

## 2015-08-26 MED ORDER — OSELTAMIVIR PHOSPHATE 75 MG PO CAPS
75.0000 mg | ORAL_CAPSULE | Freq: Two times a day (BID) | ORAL | Status: DC
Start: 2015-08-26 — End: 2015-09-09

## 2015-08-26 NOTE — Progress Notes (Signed)
  Tommi Rumps, MD Phone: [phone]  Thomas Chung is a 74 y.o. male who presents today for same day visit.  Fever: onset 4 days ago with sudden onset chills. Felt fine prior to this. Notes fever to 101.3 F this am and took tylenol for this. Had improvement with this. Has had sweats and fatigue. No rhinorrhea, cough, congestion, headaches, shortness of breath, abdominal pain, nausea, vomiting, diarrhea, dysuria, frequency, urgency, or rash. 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 Had flu shot last month.   PMH: former smoker   ROS see HPI  Objective  Physical Exam Filed Vitals:   08/26/15 0901  BP: 122/84  Pulse: 77  Temp: 97.6 F (36.4 C)    Physical Exam  Constitutional: He is well-developed, well-nourished, and in no distress.  Non-toxic  HENT:  Head: Normocephalic and atraumatic.  Right Ear: External ear normal.  Left Ear: External ear normal.  Mouth/Throat: Oropharynx is clear and moist. No oropharyngeal exudate.  Normal TM bilaterally  Eyes: Conjunctivae are normal. Pupils are equal, round, and reactive to light.  Neck: Normal range of motion. Neck supple.  Cardiovascular: Normal rate, regular rhythm and normal heart sounds.  Exam reveals no gallop and no friction rub.   No murmur heard. Pulmonary/Chest: Effort normal and breath sounds normal. No respiratory distress. He has no wheezes. He has no rales.  Abdominal: Soft. Bowel sounds are normal. He exhibits no distension. There is no tenderness. There is no rebound and no guarding.  Musculoskeletal: He exhibits no edema.  Lymphadenopathy:    He has no cervical adenopathy.  Neurological: He is alert. Gait normal.  Skin: Skin is warm and dry. He is not diaphoretic.     Assessment/Plan: Please see individual problem list.  Chills Patient with sudden onset chills 4 days ago after being in a nursing home volunteering. Suspicious for influenza  given sudden onset symptoms. Could be PNA with abnormal lung sounds, though this seems less likely given non-focal lung sounds. No other focal abnormalities on exam or history. Has mild crackles on lung exam. No shortness of breath and normal O2 sat. Appears non-toxic. Will check rapid flu and CXR. If CXR positive for PNA will treat for this. Rapid flu was negative, though symptoms suspicious for flu in >33 yo with diabetes thus will treat with tamiflu. Creatinine clearance >60 so is able to do full dose tamiflu. Tylenol prn. Given return precautions.     Orders Placed This Encounter  Procedures  . DG Chest 2 View    Standing Status: Future     Number of Occurrences: 1     Standing Expiration Date: 10/25/2016    Order Specific Question:  Reason for Exam (SYMPTOM  OR DIAGNOSIS REQUIRED)    Answer:  fever, crackles on exam    Order Specific Question:  Preferred imaging location?    Answer:  [facility]  . POCT Influenza A/B    Meds ordered this encounter  Medications  . oseltamivir (TAMIFLU) 75 MG capsule    Sig: Take 1 capsule (75 mg total) by mouth 2 (two) times daily.    Dispense:  10 capsule    Refill:  0    Tommi Rumps

## 2015-08-26 NOTE — Assessment & Plan Note (Addendum)
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 Suspicious for influenza given sudden onset symptoms. Could be PNA with abnormal lung sounds, though this seems less likely given non-focal lung sounds. No other focal abnormalities on exam or history. Has mild crackles on lung exam. No shortness of breath and normal O2 sat. Appears non-toxic. Will check rapid flu and CXR. If CXR positive for PNA will treat for this. Rapid flu was negative, though symptoms suspicious for flu in >42 yo with diabetes thus will treat with tamiflu. Creatinine clearance >60 so is able to do full dose tamiflu. Tylenol prn. Given return precautions.

## 2015-08-26 NOTE — Progress Notes (Signed)
Pre visit review using our clinic review tool, if applicable. No additional management support is needed unless otherwise documented below in the visit note. 

## 2015-08-26 NOTE — Telephone Encounter (Signed)
Called and spoke with the patient regarding the CXR results. Advised there is no PNA. Discussed that there are old rib injuries and that the radiologist noted pleural thickening. Discussed possible causes of this including reaction to prior injury, inflammation, and cancer. Discussed options for work up and patient was agreeable to CT scan chest to evaluate this further. Will place this order and office will set up the appointment.

## 2015-08-26 NOTE — Patient Instructions (Signed)
Nice to meet you. Your illness is possibly related to influenza or a pneumonia. We will check a chest x-ray to evaluate this further.  We will call with the results of this.  If you develop shortness of breath, chest pain, further fever, abdominal pain, nausea, vomiting, diarrhea, or do not improve please seek medical attention.

## 2015-08-31 ENCOUNTER — Other Ambulatory Visit: Payer: Self-pay | Admitting: Internal Medicine

## 2015-08-31 ENCOUNTER — Other Ambulatory Visit (INDEPENDENT_AMBULATORY_CARE_PROVIDER_SITE_OTHER): Payer: Medicare Other

## 2015-08-31 DIAGNOSIS — I10 Essential (primary) hypertension: Secondary | ICD-10-CM | POA: Diagnosis not present

## 2015-08-31 LAB — BASIC METABOLIC PANEL
BUN: 17 mg/dL (ref 6–23)
CO2: 29 mEq/L (ref 19–32)
Calcium: 9.6 mg/dL (ref 8.4–10.5)
Chloride: 108 mEq/L (ref 96–112)
Creatinine, Ser: 1.11 mg/dL (ref 0.40–1.50)
GFR: 68.88 mL/min (ref >60.00)
Glucose, Bld: 111 mg/dL — ABNORMAL HIGH (ref 70–99)
Potassium: 4.2 mEq/L (ref 3.5–5.1)
Sodium: 144 mEq/L (ref 135–145)

## 2015-09-06 ENCOUNTER — Encounter: Payer: Self-pay | Admitting: Internal Medicine

## 2015-09-06 ENCOUNTER — Ambulatory Visit (INDEPENDENT_AMBULATORY_CARE_PROVIDER_SITE_OTHER)
Admission: RE | Admit: 2015-09-06 | Discharge: 2015-09-06 | Disposition: A | Discharge status: Home / Self Care | Payer: Medicare Other | Source: Ambulatory Visit | Attending: Family Medicine | Admitting: Family Medicine

## 2015-09-06 DIAGNOSIS — J941 Fibrothorax: Secondary | ICD-10-CM | POA: Diagnosis not present

## 2015-09-06 DIAGNOSIS — J929 Pleural plaque without asbestos: Secondary | ICD-10-CM

## 2015-09-06 DIAGNOSIS — R938 Abnormal findings on diagnostic imaging of other specified body structures: Secondary | ICD-10-CM

## 2015-09-06 DIAGNOSIS — R9389 Abnormal findings on diagnostic imaging of other specified body structures: Secondary | ICD-10-CM

## 2015-09-06 MED ORDER — IOHEXOL 300 MG/ML  SOLN: 80.0000 mL | Freq: Once | INTRAMUSCULAR | Status: DC | PRN

## 2015-09-07 ENCOUNTER — Telehealth: Payer: Self-pay | Admitting: Family Medicine

## 2015-09-07 NOTE — Telephone Encounter (Signed)
Spoke with patient regarding CT scan results. Informed patient of pleural thickening that is underlying prior rib fractures. This could be reactive changes. Discuss having patient follow up with his PCP to discuss if any further workup is necessary. Also advised of calcified nodes and remote granulomatous disease seen on CT scan. Also noted calcifications in the aorta and coronary arteries as well. Discussed that he should follow-up with his PCP for further evaluation and management of these issues and patient notes he has an appointment this Thursday for follow-up. Advised patient to keep this.

## 2015-09-09 ENCOUNTER — Other Ambulatory Visit (INDEPENDENT_AMBULATORY_CARE_PROVIDER_SITE_OTHER): Payer: Medicare Other

## 2015-09-09 ENCOUNTER — Encounter: Payer: Self-pay | Admitting: Internal Medicine

## 2015-09-09 ENCOUNTER — Ambulatory Visit (INDEPENDENT_AMBULATORY_CARE_PROVIDER_SITE_OTHER): Payer: Medicare Other | Admitting: Internal Medicine

## 2015-09-09 VITALS — BP 124/64 | HR 73 | Temp 97.7°F | Resp 16 | Ht 76.0 in | Wt 255.0 lb

## 2015-09-09 DIAGNOSIS — I10 Essential (primary) hypertension: Secondary | ICD-10-CM

## 2015-09-09 DIAGNOSIS — E118 Type 2 diabetes mellitus with unspecified complications: Secondary | ICD-10-CM

## 2015-09-09 DIAGNOSIS — K8020 Calculus of gallbladder without cholecystitis without obstruction: Secondary | ICD-10-CM | POA: Insufficient documentation

## 2015-09-09 LAB — HEMOGLOBIN A1C: Hgb A1c MFr Bld: 6.2 % (ref 4.6–6.5)

## 2015-09-09 NOTE — Progress Notes (Signed)
Pre visit review using our clinic review tool, if applicable. No additional management support is needed unless otherwise documented below in the visit note. 

## 2015-09-09 NOTE — Patient Instructions (Signed)

## 2015-09-09 NOTE — Progress Notes (Signed)
Subjective:  Patient ID: Thomas Chung, male    DOB: 1941/08/10  Age: 74 y.o. MRN: [id number]  CC: Hypertension and Diabetes   HPI Thomas Chung presents for follow-up on hypertension and diabetes. He was seen a few weeks ago by another provider for a febrile upper respiratory illness. A chest x-ray and CT scan were done. This showed benign findings. His symptoms have resolved. The CT scan did show gallstones. He denies any episodes of abdominal pain, nausea, or vomiting. He feels well today and offers no complaints.  Outpatient Prescriptions Prior to Visit  Medication Sig Dispense Refill  . aspirin 81 MG tablet Take 81 mg by mouth daily.      Marland Kitchen atorvastatin (LIPITOR) 40 MG tablet TAKE 1 TABLET BY MOUTH EVERY DAY 90 tablet 3  . Benfotiamine 150 MG CAPS Take by mouth 2 (two) times daily.    . COD LIVER OIL PO Take by mouth.    Marland Kitchen glucose blood (BAYER CONTOUR NEXT TEST) test strip Use to test blood sugar once daily ICD 10 code E11 8 100 each 3  . Lancets MISC Test twice daily 100 each 11  . losartan-hydrochlorothiazide (HYZAAR) 100-12.5 MG per tablet TAKE 1 TABLET EVERY DAY FOR HYPERTENSION 90 tablet 1  . metoprolol succinate (TOPROL-XL) 50 MG 24 hr tablet TAKE 1 TABLET BY MOUTH ONCE DAILY 90 tablet 3  . oseltamivir (TAMIFLU) 75 MG capsule Take 1 capsule (75 mg total) by mouth 2 (two) times daily. 10 capsule 0   No facility-administered medications prior to visit.    ROS Review of Systems  Constitutional: Negative.  Negative for fever, chills, diaphoresis, appetite change and fatigue.  HENT: Negative.  Negative for congestion, sinus pressure, sore throat and trouble swallowing.   Eyes: Negative.   Respiratory: Negative.  Negative for cough, choking, chest tightness, shortness of breath and stridor.   Cardiovascular: Negative.  Negative for chest pain, palpitations and leg swelling.  Gastrointestinal: Negative.  Negative for nausea, vomiting, abdominal pain, diarrhea, constipation  and blood in stool.  Endocrine: Negative.   Genitourinary: Negative.   Musculoskeletal: Negative.  Negative for myalgias, back pain, joint swelling and arthralgias.  Skin: Negative.  Negative for rash.  Allergic/Immunologic: Negative.   Neurological: Negative.   Hematological: Negative.  Negative for adenopathy. Does not bruise/bleed easily.  Psychiatric/Behavioral: Negative.     Objective:  BP 124/64 mmHg  Pulse 73  Temp(Src) 97.7 F (36.5 C) (Oral)  Resp 16  Ht 6\' 4"  (1.93 m)  Wt 255 lb (115.667 kg)  BMI 31.05 kg/m2  SpO2 96%  BP Readings from Last 3 Encounters:  09/09/15 124/64  08/26/15 122/84  06/30/15 138/84    Wt Readings from Last 3 Encounters:  09/09/15 255 lb (115.667 kg)  08/26/15 253 lb (114.76 kg)  06/30/15 261 lb (118.389 kg)    Physical Exam  Constitutional: He is oriented to person, place, and time. He appears well-developed and well-nourished. No distress.  HENT:  Mouth/Throat: Oropharynx is clear and moist. No oropharyngeal exudate.  Eyes: Conjunctivae are normal. Right eye exhibits no discharge. Left eye exhibits no discharge. No scleral icterus.  Neck: Normal range of motion. Neck supple. No JVD present. No tracheal deviation present. No thyromegaly present.  Cardiovascular: Normal rate, regular rhythm, normal heart sounds and intact distal pulses.  Exam reveals no gallop and no friction rub.   No murmur heard. Pulmonary/Chest: Effort normal and breath sounds normal. No stridor. No respiratory distress. He has no wheezes. He has  no rales. He exhibits no tenderness.  Abdominal: Soft. Bowel sounds are normal. He exhibits no distension and no mass. There is no tenderness. There is no rebound and no guarding.  Musculoskeletal: Normal range of motion. He exhibits no edema or tenderness.  Lymphadenopathy:    He has no cervical adenopathy.  Neurological: He is oriented to person, place, and time.  Skin: Skin is warm and dry. No rash noted. He is not  diaphoretic. No erythema. No pallor.  Psychiatric: He has a normal mood and affect. His behavior is normal. Judgment and thought content normal.  Vitals reviewed.   Lab Results  Component Value Date   WBC 8.3 06/09/2015   HGB 15.6 06/09/2015   HCT 47.1 06/09/2015   PLT 337.0 06/09/2015   GLUCOSE 111* 08/31/2015   CHOL 161 02/10/2015   TRIG 102.0 02/10/2015   HDL 43.30 02/10/2015   LDLCALC 97 02/10/2015   ALT 28 12/10/2013   AST 26 12/10/2013   NA 144 08/31/2015   K 4.2 08/31/2015   CL 108 08/31/2015   CREATININE 1.11 08/31/2015   BUN 17 08/31/2015   CO2 29 08/31/2015   TSH 1.83 02/10/2015   PSA 0.12 02/10/2015   HGBA1C 6.2 09/09/2015   MICROALBUR 0.9 02/10/2015    Ct Chest W Contrast  09/06/2015  CLINICAL DATA:  Pleural thickening. Abnormal chest x-ray. Smoking exposure. EXAM: CT CHEST WITH CONTRAST TECHNIQUE: Multidetector CT imaging of the chest was performed during intravenous contrast administration. CONTRAST:  80 mL Omnipaque 300 COMPARISON:  Chest x-ray 08/26/2015. FINDINGS: Multiple healed right-sided rib fractures resulted in the appearance of thickened pleura on the right. Posterior healed left-sided rib fractures are noted as well. There is mild thickening of the pleura, worse on the right. The heart is upper limits of normal for size. Coronary artery calcifications are present. There is no significant mediastinal or axillary adenopathy. No significant pleural or pericardial effusion is present. Calcified subcarinal and right hilar lymph nodes are present. There is a calcified left AP window node is well. Atherosclerotic calcifications are present within the aortic arch without aneurysm or significant stenosis of the great vessels. The thoracic inlet is within normal limits. The esophagus is unremarkable. The visualized portions of the liver are normal. The spleen is mildly enlarged with granulomatous calcifications. Gallstones are present without inflammatory changes to  suggest cholecystitis. The visualized upper abdomen is otherwise within normal limits. Minimal scarring a bronchiectasis are present at the right lung base. There is focal calcification compatible with remote granulomatous change. No other focal nodule, mass, or airspace disease is present. Minimal pleural thickening is noted along the areas of previous fracture on the right. There is minimal pleural thickening on the left. Remote healed fractures are present on the left is well. No acute fractures are present. Mild degenerative endplate changes are present in the thoracic spine. IMPRESSION: 1. Minimal pleural thickening is present bilaterally in association with prior fractures. This is limited by the major fissure on both sides and is likely reactive. There is no focal nodularity or enhancement. 2. 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 3. 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 4. Atherosclerotic changes including coronary artery disease. 5. Cholelithiasis without evidence for cholecystitis. 6. No acute cardiopulmonary disease. Electronically Signed   By: San Morelle M.D.   On: 09/06/2015 14:44    Assessment & Plan:   Yee was seen today for hypertension and diabetes.  Diagnoses and all orders for  this visit:  Type 2 diabetes mellitus with complication, without long-term current use of insulin ([place])- his blood sugars are well-controlled. -     Hemoglobin A1c; Future  Asymptomatic gallstones- the stones are asymptomatic and he does not wish to see a general surgeon about having his gallbladder removed.  Essential hypertension, benign- his blood pressure is well-controlled, recent electrolytes and renal function were stable.  I have discontinued Mr. Pflug's oseltamivir. I am also having him maintain his aspirin, COD LIVER OIL PO, glucose blood, Lancets, metoprolol succinate, Benfotiamine, losartan-hydrochlorothiazide,  and atorvastatin.  No orders of the defined types were placed in this encounter.     Follow-up: Return in about 6 months (around 03/09/2016).  Scarlette Calico, MD

## 2015-09-13 DIAGNOSIS — L820 Inflamed seborrheic keratosis: Secondary | ICD-10-CM | POA: Diagnosis not present

## 2015-09-13 DIAGNOSIS — D225 Melanocytic nevi of trunk: Secondary | ICD-10-CM | POA: Diagnosis not present

## 2015-09-13 DIAGNOSIS — Z85828 Personal history of other malignant neoplasm of skin: Secondary | ICD-10-CM | POA: Diagnosis not present

## 2015-09-13 DIAGNOSIS — L812 Freckles: Secondary | ICD-10-CM | POA: Diagnosis not present

## 2015-09-13 DIAGNOSIS — L570 Actinic keratosis: Secondary | ICD-10-CM | POA: Diagnosis not present

## 2015-09-13 DIAGNOSIS — L821 Other seborrheic keratosis: Secondary | ICD-10-CM | POA: Diagnosis not present

## 2015-10-12 DIAGNOSIS — H04123 Dry eye syndrome of bilateral lacrimal glands: Secondary | ICD-10-CM | POA: Diagnosis not present

## 2015-10-12 DIAGNOSIS — H35033 Hypertensive retinopathy, bilateral: Secondary | ICD-10-CM | POA: Diagnosis not present

## 2015-10-12 DIAGNOSIS — E119 Type 2 diabetes mellitus without complications: Secondary | ICD-10-CM | POA: Diagnosis not present

## 2015-10-12 LAB — HM DIABETES EYE EXAM

## 2015-10-23 NOTE — Addendum Note (Signed)
Addended by: Janith Lima on: 10/23/2015 09:32 AM   Modules accepted: Miquel Dunn

## 2015-11-04 DIAGNOSIS — I739 Peripheral vascular disease, unspecified: Secondary | ICD-10-CM | POA: Diagnosis not present

## 2015-11-04 DIAGNOSIS — E1151 Type 2 diabetes mellitus with diabetic peripheral angiopathy without gangrene: Secondary | ICD-10-CM | POA: Diagnosis not present

## 2015-11-04 DIAGNOSIS — L603 Nail dystrophy: Secondary | ICD-10-CM | POA: Diagnosis not present

## 2016-01-01 ENCOUNTER — Other Ambulatory Visit: Payer: Self-pay | Admitting: Internal Medicine

## 2016-01-11 ENCOUNTER — Ambulatory Visit (INDEPENDENT_AMBULATORY_CARE_PROVIDER_SITE_OTHER): Payer: Medicare Other | Admitting: Internal Medicine

## 2016-01-11 ENCOUNTER — Ambulatory Visit: Payer: Medicare Other | Admitting: Internal Medicine

## 2016-01-11 ENCOUNTER — Encounter: Payer: Self-pay | Admitting: Internal Medicine

## 2016-01-11 VITALS — BP 108/64 | HR 64 | Temp 97.6°F | Resp 16 | Ht 76.0 in | Wt 249.8 lb

## 2016-01-11 DIAGNOSIS — J069 Acute upper respiratory infection, unspecified: Secondary | ICD-10-CM

## 2016-01-11 DIAGNOSIS — I10 Essential (primary) hypertension: Secondary | ICD-10-CM

## 2016-01-11 DIAGNOSIS — N403 Nodular prostate with lower urinary tract symptoms: Secondary | ICD-10-CM

## 2016-01-11 DIAGNOSIS — E118 Type 2 diabetes mellitus with unspecified complications: Secondary | ICD-10-CM | POA: Diagnosis not present

## 2016-01-11 DIAGNOSIS — E785 Hyperlipidemia, unspecified: Secondary | ICD-10-CM

## 2016-01-11 DIAGNOSIS — B9789 Other viral agents as the cause of diseases classified elsewhere: Secondary | ICD-10-CM

## 2016-01-11 DIAGNOSIS — N401 Enlarged prostate with lower urinary tract symptoms: Secondary | ICD-10-CM

## 2016-01-11 DIAGNOSIS — N138 Other obstructive and reflux uropathy: Secondary | ICD-10-CM

## 2016-01-11 MED ORDER — LOSARTAN POTASSIUM-HCTZ 100-12.5 MG PO TABS: 1.0000 | ORAL_TABLET | Freq: Every day | ORAL | Status: DC

## 2016-01-11 MED ORDER — HYDROCODONE-HOMATROPINE 5-1.5 MG/5ML PO SYRP: 5.0000 mL | ORAL_SOLUTION | Freq: Three times a day (TID) | ORAL | Status: DC | PRN

## 2016-01-11 NOTE — Patient Instructions (Signed)

## 2016-01-11 NOTE — Progress Notes (Signed)
Pre visit review using our clinic review tool, if applicable. No additional management support is needed unless otherwise documented below in the visit note. 

## 2016-01-12 ENCOUNTER — Other Ambulatory Visit (INDEPENDENT_AMBULATORY_CARE_PROVIDER_SITE_OTHER): Payer: Medicare Other

## 2016-01-12 ENCOUNTER — Encounter: Payer: Self-pay | Admitting: Internal Medicine

## 2016-01-12 DIAGNOSIS — N401 Enlarged prostate with lower urinary tract symptoms: Secondary | ICD-10-CM | POA: Diagnosis not present

## 2016-01-12 DIAGNOSIS — E785 Hyperlipidemia, unspecified: Secondary | ICD-10-CM | POA: Diagnosis not present

## 2016-01-12 DIAGNOSIS — E118 Type 2 diabetes mellitus with unspecified complications: Secondary | ICD-10-CM | POA: Diagnosis not present

## 2016-01-12 DIAGNOSIS — N403 Nodular prostate with lower urinary tract symptoms: Secondary | ICD-10-CM

## 2016-01-12 DIAGNOSIS — I10 Essential (primary) hypertension: Secondary | ICD-10-CM

## 2016-01-12 DIAGNOSIS — N138 Other obstructive and reflux uropathy: Secondary | ICD-10-CM

## 2016-01-12 LAB — BASIC METABOLIC PANEL
BUN: 28 mg/dL — ABNORMAL HIGH (ref 6–23)
CO2: 29 mEq/L (ref 19–32)
Calcium: 10.1 mg/dL (ref 8.4–10.5)
Chloride: 106 mEq/L (ref 96–112)
Creatinine, Ser: 1.31 mg/dL (ref 0.40–1.50)
GFR: 56.83 mL/min — ABNORMAL LOW (ref >60.00)
Glucose, Bld: 106 mg/dL — ABNORMAL HIGH (ref 70–99)
Potassium: 5.3 mEq/L — ABNORMAL HIGH (ref 3.5–5.1)
Sodium: 144 mEq/L (ref 135–145)

## 2016-01-12 LAB — LIPID PANEL
Cholesterol: 115 mg/dL (ref 0–200)
HDL: 39.3 mg/dL (ref >39.00)
LDL Cholesterol: 59 mg/dL (ref 0–99)
NonHDL: 75.23
Total CHOL/HDL Ratio: 3
Triglycerides: 83 mg/dL (ref 0.0–149.0)
VLDL: 16.6 mg/dL (ref 0.0–40.0)

## 2016-01-12 LAB — URINALYSIS, ROUTINE W REFLEX MICROSCOPIC
Bilirubin Urine: NEGATIVE
Hgb urine dipstick: NEGATIVE
Ketones, ur: NEGATIVE
Leukocytes, UA: NEGATIVE
Nitrite: NEGATIVE
RBC / HPF: NONE SEEN (ref 0–?)
Specific Gravity, Urine: 1.015 (ref 1.000–1.030)
Total Protein, Urine: NEGATIVE
Urine Glucose: NEGATIVE
Urobilinogen, UA: 0.2 (ref 0.0–1.0)
WBC, UA: NONE SEEN (ref 0–?)
pH: 6 (ref 5.0–8.0)

## 2016-01-12 LAB — MICROALBUMIN / CREATININE URINE RATIO
Creatinine,U: 119.1 mg/dL
Microalb Creat Ratio: 0.8 mg/g (ref 0.0–30.0)
Microalb, Ur: 0.9 mg/dL (ref 0.0–1.9)

## 2016-01-12 LAB — PSA: PSA: 0.07 ng/mL — ABNORMAL LOW (ref 0.10–4.00)

## 2016-01-12 LAB — HEMOGLOBIN A1C: Hgb A1c MFr Bld: 6.1 % (ref 4.6–6.5)

## 2016-01-12 LAB — TSH: TSH: 1.3 u[IU]/mL (ref 0.35–4.50)

## 2016-01-12 MED ORDER — LOSARTAN POTASSIUM 100 MG PO TABS: 100.0000 mg | ORAL_TABLET | Freq: Every day | ORAL | Status: DC

## 2016-01-12 NOTE — Progress Notes (Signed)
Subjective:  Patient ID: Thomas Chung, male    DOB: [birth year]  Age: 75 y.o. MRN: [id number]  CC: URI; Hypertension; Hyperlipidemia; and Diabetes   HPI Thomas Chung presents for follow-up on the above medical concerns, this was a scheduled visit but in the interim, for the last 3-4 days he has had a sore throat and nonproductive cough with runny nose. He denies fever, chills, shortness of breath, or night sweats.  Outpatient Prescriptions Prior to Visit  Medication Sig Dispense Refill  . aspirin 81 MG tablet Take 81 mg by mouth daily.      Marland Kitchen atorvastatin (LIPITOR) 40 MG tablet TAKE 1 TABLET BY MOUTH EVERY DAY 90 tablet 3  . Benfotiamine 150 MG CAPS Take by mouth 2 (two) times daily.    . COD LIVER OIL PO Take by mouth.    Marland Kitchen glucose blood (BAYER CONTOUR NEXT TEST) test strip Use to test blood sugar once daily ICD 10 code E11 8 100 each 3  . Lancets MISC Test twice daily 100 each 11  . metoprolol succinate (TOPROL-XL) 50 MG 24 hr tablet TAKE 1 TABLET BY MOUTH ONCE DAILY 90 tablet 3  . losartan-hydrochlorothiazide (HYZAAR) 100-12.5 MG tablet TAKE 1 TABLET EVERY DAY FOR HYPERTENSION 90 tablet 1   No facility-administered medications prior to visit.    ROS Review of Systems  Constitutional: Negative.  Negative for fever, chills, diaphoresis, appetite change and fatigue.  HENT: Positive for postnasal drip, rhinorrhea and sore throat. Negative for congestion, facial swelling, sinus pressure, trouble swallowing and voice change.   Eyes: Negative.   Respiratory: Positive for cough. Negative for apnea, choking, chest tightness, shortness of breath, wheezing and stridor.   Cardiovascular: Negative.  Negative for chest pain, palpitations and leg swelling.  Gastrointestinal: Negative.  Negative for nausea, vomiting, abdominal pain, diarrhea and constipation.  Endocrine: Negative.  Negative for polydipsia, polyphagia and polyuria.  Genitourinary: Negative.   Musculoskeletal: Negative.     Skin: Negative.  Negative for color change and rash.  Allergic/Immunologic: Negative.   Neurological: Negative.  Negative for dizziness, tremors, weakness, light-headedness, numbness and headaches.  Hematological: Negative.  Negative for adenopathy. Does not bruise/bleed easily.  Psychiatric/Behavioral: Negative.     Objective:  BP 108/64 mmHg  Pulse 64  Temp(Src) 97.6 F (36.4 C) (Oral)  Resp 16  Ht 6\' 4"  (1.93 m)  Wt 249 lb 12 oz (113.286 kg)  BMI 30.41 kg/m2  SpO2 95%  BP Readings from Last 3 Encounters:  01/11/16 108/64  09/09/15 124/64  08/26/15 122/84    Wt Readings from Last 3 Encounters:  01/11/16 249 lb 12 oz (113.286 kg)  09/09/15 255 lb (115.667 kg)  08/26/15 253 lb (114.76 kg)    Physical Exam  Constitutional: He is oriented to person, place, and time. He appears well-developed and well-nourished.  Non-toxic appearance. He does not have a sickly appearance. He does not appear ill. No distress.  HENT:  Head: Normocephalic and atraumatic.  Mouth/Throat: Oropharynx is clear and moist and mucous membranes are normal. Mucous membranes are not pale, not dry and not cyanotic. No oral lesions. No trismus in the jaw. No uvula swelling. No oropharyngeal exudate, posterior oropharyngeal edema, posterior oropharyngeal erythema or tonsillar abscesses.  Eyes: Conjunctivae are normal. Right eye exhibits no discharge. Left eye exhibits no discharge. No scleral icterus.  Neck: Normal range of motion. Neck supple. No JVD present. No tracheal deviation present. No thyromegaly present.  Cardiovascular: Normal rate, regular rhythm, normal heart sounds  and intact distal pulses.  Exam reveals no gallop and no friction rub.   No murmur heard. Pulmonary/Chest: Effort normal and breath sounds normal. No stridor. No respiratory distress. He has no wheezes. He has no rales. He exhibits no tenderness.  Abdominal: Soft. Bowel sounds are normal. He exhibits no distension and no mass. There is  no tenderness. There is no rebound and no guarding.  Musculoskeletal: Normal range of motion. He exhibits no edema or tenderness.  Lymphadenopathy:    He has no cervical adenopathy.  Neurological: He is oriented to person, place, and time.  Skin: Skin is warm and dry. No rash noted. He is not diaphoretic. No erythema. No pallor.  Vitals reviewed.   Lab Results  Component Value Date   WBC 8.3 06/09/2015   HGB 15.6 06/09/2015   HCT 47.1 06/09/2015   PLT 337.0 06/09/2015   GLUCOSE 106* 01/12/2016   CHOL 115 01/12/2016   TRIG 83.0 01/12/2016   HDL 39.30 01/12/2016   LDLCALC 59 01/12/2016   ALT 28 12/10/2013   AST 26 12/10/2013   NA 144 01/12/2016   K 5.3* 01/12/2016   CL 106 01/12/2016   CREATININE 1.31 01/12/2016   BUN 28* 01/12/2016   CO2 29 01/12/2016   TSH 1.30 01/12/2016   PSA 0.07* 01/12/2016   HGBA1C 6.1 01/12/2016   MICROALBUR 0.9 01/12/2016    Ct Chest W Contrast  09/06/2015  CLINICAL DATA:  Pleural thickening. Abnormal chest x-ray. Smoking exposure. EXAM: CT CHEST WITH CONTRAST TECHNIQUE: Multidetector CT imaging of the chest was performed during intravenous contrast administration. CONTRAST:  80 mL Omnipaque 300 COMPARISON:  Chest x-ray 08/26/2015. FINDINGS: Multiple healed right-sided rib fractures resulted in the appearance of thickened pleura on the right. Posterior healed left-sided rib fractures are noted as well. There is mild thickening of the pleura, worse on the right. The heart is upper limits of normal for size. Coronary artery calcifications are present. There is no significant mediastinal or axillary adenopathy. No significant pleural or pericardial effusion is present. Calcified subcarinal and right hilar lymph nodes are present. There is a calcified left AP window node is well. Atherosclerotic calcifications are present within the aortic arch without aneurysm or significant stenosis of the great vessels. The thoracic inlet is within normal limits. The  esophagus is unremarkable. The visualized portions of the liver are normal. The spleen is mildly enlarged with granulomatous calcifications. Gallstones are present without inflammatory changes to suggest cholecystitis. The visualized upper abdomen is otherwise within normal limits. Minimal scarring a bronchiectasis are present at the right lung base. There is focal calcification compatible with remote granulomatous change. No other focal nodule, mass, or airspace disease is present. Minimal pleural thickening is noted along the areas of previous fracture on the right. There is minimal pleural thickening on the left. Remote healed fractures are present on the left is well. No acute fractures are present. Mild degenerative endplate changes are present in the thoracic spine. IMPRESSION: 1. Minimal pleural thickening is present bilaterally in association with prior fractures. This is limited by the major fissure on both sides and is likely reactive. There is no focal nodularity or enhancement. 2. 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 3. 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 4. Atherosclerotic changes including coronary artery disease. 5. Cholelithiasis without evidence for cholecystitis. 6. No acute cardiopulmonary disease. Electronically Signed   By: San Morelle M.D.   On: 09/06/2015 14:44    Assessment &  Plan:   Thomas Chung was seen today for uri, hypertension, hyperlipidemia and diabetes.  Diagnoses and all orders for this visit:  Type 2 diabetes mellitus with complication, without long-term current use of insulin ([place])- his blood sugars are adequately well controlled, no medication is needed at this time. -     Basic metabolic panel; Future -     Microalbumin / creatinine urine ratio; Future -     Hemoglobin A1c; Future  Essential hypertension, benign- his systolic blood pressure is over treated and he has an increase in his  BUN and a slight decrease in his GFR, I don't think he needs to take a diuretic anymore so I have changed his losartan HCT to just plain losartan -     Urinalysis, Routine w reflex microscopic (not at [facility]); Future -     Basic metabolic panel; Future  Prostate nodule with urinary obstruction -     PSA; Future  Hyperlipidemia with target LDL less than 100- he has achieved his LDL goal is doing well on the statin -     Lipid panel; Future -     TSH; Future  Viral URI with cough- this is viral, will treat symptomatically -     HYDROcodone-homatropine (HYCODAN) 5-1.5 MG/5ML syrup; Take 5 mLs by mouth every 8 (eight) hours as needed for cough.  Other orders -     losartan-hydrochlorothiazide (HYZAAR) 100-12.5 MG tablet; Take 1 tablet by mouth daily.   I have changed Thomas Chung's losartan-hydrochlorothiazide. I am also having him start on HYDROcodone-homatropine. Additionally, I am having him maintain his aspirin, COD LIVER OIL PO, glucose blood, Lancets, metoprolol succinate, Benfotiamine, and atorvastatin.  Meds ordered this encounter  Medications  . losartan-hydrochlorothiazide (HYZAAR) 100-12.5 MG tablet    Sig: Take 1 tablet by mouth daily.    Dispense:  90 tablet    Refill:  1  . HYDROcodone-homatropine (HYCODAN) 5-1.5 MG/5ML syrup    Sig: Take 5 mLs by mouth every 8 (eight) hours as needed for cough.    Dispense:  120 mL    Refill:  0     Follow-up: Return in about 3 weeks (around 02/01/2016).  Scarlette Calico, MD

## 2016-01-26 DIAGNOSIS — E1151 Type 2 diabetes mellitus with diabetic peripheral angiopathy without gangrene: Secondary | ICD-10-CM | POA: Diagnosis not present

## 2016-01-26 DIAGNOSIS — L603 Nail dystrophy: Secondary | ICD-10-CM | POA: Diagnosis not present

## 2016-01-26 DIAGNOSIS — I739 Peripheral vascular disease, unspecified: Secondary | ICD-10-CM | POA: Diagnosis not present

## 2016-01-28 ENCOUNTER — Ambulatory Visit (INDEPENDENT_AMBULATORY_CARE_PROVIDER_SITE_OTHER): Payer: Medicare Other | Admitting: Nurse Practitioner

## 2016-01-28 ENCOUNTER — Encounter: Payer: Self-pay | Admitting: Nurse Practitioner

## 2016-01-28 VITALS — BP 140/84 | HR 75 | Temp 99.1°F | Ht 76.0 in | Wt 254.0 lb

## 2016-01-28 DIAGNOSIS — J069 Acute upper respiratory infection, unspecified: Secondary | ICD-10-CM

## 2016-01-28 DIAGNOSIS — B9789 Other viral agents as the cause of diseases classified elsewhere: Principal | ICD-10-CM

## 2016-01-28 MED ORDER — AMOXICILLIN-POT CLAVULANATE 875-125 MG PO TABS: 1.0000 | ORAL_TABLET | Freq: Two times a day (BID) | ORAL | Status: DC

## 2016-01-28 NOTE — Progress Notes (Signed)
Patient ID: Thomas Chung, male    DOB: [birth year]  Age: 75 y.o. MRN: [id number]  CC: No chief complaint on file.   HPI Thomas Chung presents for CC of cough/chills x 18 days.  1) Worsening since Wed.  Wed. Felt poorly and had a cough with chills Denies fever  Lots of fatigue- stopped his usual exercise and swimming due to symptoms  Cough- white phlegm  Thursday felt worse with chills- didn't take temp Treatment to date: Net pot daily  Denies sick contacts Dorsal surface of foot- warm right   History Thomas Chung has a past medical history of Depression; Hypertension; Hyperlipidemia; Fatty liver; colonic polyps; Neuromuscular disorder (Thomas Chung); HOH (hard of hearing); and Glucose intolerance (impaired glucose tolerance).   He has past surgical history that includes Vasectomy; Tibia fracture surgery (2001); Wrist fracture surgery (2002); Colonoscopy (2006, 2009, 07/28/2011); Colonoscopy; ORIF finger fracture (02/08/2012); and Squamous cell carcinoma excision.   His family history includes Arrhythmia in his mother; Heart attack in his mother; Heart disease in his mother; Heart failure in his father. There is no history of Cancer, Stroke, Hyperlipidemia, Hypertension, or Kidney disease.He reports that he has quit smoking. He has never used smokeless tobacco. He reports that he drinks about 1.8 oz of alcohol per week. He reports that he does not use illicit drugs.  Outpatient Prescriptions Prior to Visit  Medication Sig Dispense Refill  . aspirin 81 MG tablet Take 81 mg by mouth daily.      Marland Kitchen atorvastatin (LIPITOR) 40 MG tablet TAKE 1 TABLET BY MOUTH EVERY DAY 90 tablet 3  . Benfotiamine 150 MG CAPS Take by mouth 2 (two) times daily.    . COD LIVER OIL PO Take by mouth.    Marland Kitchen glucose blood (BAYER CONTOUR NEXT TEST) test strip Use to test blood sugar once daily ICD 10 code E11 8 100 each 3  . HYDROcodone-homatropine (HYCODAN) 5-1.5 MG/5ML syrup Take 5 mLs by mouth every 8 (eight) hours as needed  for cough. 120 mL 0  . Lancets MISC Test twice daily 100 each 11  . losartan (COZAAR) 100 MG tablet Take 1 tablet (100 mg total) by mouth daily. 90 tablet 3  . metoprolol succinate (TOPROL-XL) 50 MG 24 hr tablet TAKE 1 TABLET BY MOUTH ONCE DAILY 90 tablet 3   No facility-administered medications prior to visit.    ROS Review of Systems  Constitutional: Positive for fatigue. Negative for fever, chills and diaphoresis.  HENT: Positive for congestion, postnasal drip and rhinorrhea.   Eyes: Negative for visual disturbance.  Respiratory: Positive for cough. Negative for chest tightness, shortness of breath and wheezing.   Cardiovascular: Negative for chest pain, palpitations and leg swelling.  Gastrointestinal: Negative for nausea, vomiting and diarrhea.  Skin:       Temp change to right foot  Neurological: Negative for dizziness, light-headedness and headaches.    Objective:  BP 140/84 mmHg  Pulse 75  Temp(Src) 99.1 F (37.3 C) (Oral)  Ht 6\' 4"  (1.93 m)  Wt 254 lb (115.214 kg)  BMI 30.93 kg/m2  SpO2 95%  Physical Exam  Constitutional: He is oriented to person, place, and time. He appears well-developed and well-nourished. No distress.  HENT:  Head: Normocephalic and atraumatic.  Right Ear: External ear normal.  Left Ear: External ear normal.  Mouth/Throat: No oropharyngeal exudate.  TMs clear bilaterally  Eyes: EOM are normal. Pupils are equal, round, and reactive to light. Right eye exhibits no discharge. Left eye exhibits no  discharge. No scleral icterus.  Neck: Normal range of motion. Neck supple.  Cardiovascular: Normal rate, regular rhythm and normal heart sounds.  Exam reveals no gallop and no friction rub.   No murmur heard. Pulmonary/Chest: Effort normal and breath sounds normal. No respiratory distress. He has no wheezes. He has no rales. He exhibits no tenderness.  Lymphadenopathy:    He has no cervical adenopathy.  Neurological: He is alert and oriented to person,  place, and time.  Skin: Skin is warm and dry. No rash noted. He is not diaphoretic.  Slightly warm dorsal surface of right foot- no sign of infection, looks to have PVD  Psychiatric: He has a normal mood and affect. His behavior is normal. Judgment and thought content normal.   Assessment & Plan:   There are no diagnoses linked to this encounter. I am having Thomas Chung start on amoxicillin-clavulanate. I am also having him maintain his aspirin, COD LIVER OIL PO, glucose blood, Lancets, metoprolol succinate, Benfotiamine, atorvastatin, HYDROcodone-homatropine, and losartan.  Meds ordered this encounter  Medications  . amoxicillin-clavulanate (AUGMENTIN) 875-125 MG tablet    Sig: Take 1 tablet by mouth 2 (two) times daily.    Dispense:  14 tablet    Refill:  0    Order Specific Question:  Supervising Provider    Answer:  Thomas Chung [[id number]]     Follow-up: Return if symptoms worsen or fail to improve.

## 2016-01-28 NOTE — Patient Instructions (Signed)
Continue cough syrup and ibuprofen as needed.  Rest, fluids, and Augmentin. This may take a week or two to get fully over.

## 2016-01-28 NOTE — Progress Notes (Signed)
Pre visit review using our clinic review tool, if applicable. No additional management support is needed unless otherwise documented below in the visit note. 

## 2016-02-01 ENCOUNTER — Ambulatory Visit: Payer: Medicare Other | Admitting: Internal Medicine

## 2016-02-01 NOTE — Assessment & Plan Note (Addendum)
New Onset Due to length of symptoms with worsening will treat empirically  Augmentin was sent to the pharmacy (will help if foot has issues also)  Encouraged Probiotics Continue OTC measures  FU prn worsening/failure to improve.

## 2016-02-07 ENCOUNTER — Encounter: Payer: Self-pay | Admitting: Internal Medicine

## 2016-02-07 ENCOUNTER — Ambulatory Visit (INDEPENDENT_AMBULATORY_CARE_PROVIDER_SITE_OTHER): Payer: Medicare Other | Admitting: Internal Medicine

## 2016-02-07 ENCOUNTER — Ambulatory Visit (INDEPENDENT_AMBULATORY_CARE_PROVIDER_SITE_OTHER)
Admission: RE | Admit: 2016-02-07 | Discharge: 2016-02-07 | Disposition: A | Discharge status: Home / Self Care | Payer: Medicare Other | Source: Ambulatory Visit | Attending: Internal Medicine | Admitting: Internal Medicine

## 2016-02-07 VITALS — BP 112/70 | HR 68 | Temp 98.2°F | Resp 16 | Ht 76.0 in | Wt 244.0 lb

## 2016-02-07 DIAGNOSIS — J069 Acute upper respiratory infection, unspecified: Secondary | ICD-10-CM | POA: Diagnosis not present

## 2016-02-07 DIAGNOSIS — R05 Cough: Secondary | ICD-10-CM

## 2016-02-07 DIAGNOSIS — B9789 Other viral agents as the cause of diseases classified elsewhere: Principal | ICD-10-CM

## 2016-02-07 DIAGNOSIS — R059 Cough, unspecified: Secondary | ICD-10-CM

## 2016-02-07 NOTE — Progress Notes (Signed)
Pre visit review using our clinic review tool, if applicable. No additional management support is needed unless otherwise documented below in the visit note. 

## 2016-02-07 NOTE — Progress Notes (Signed)
Subjective:  Patient ID: Thomas Chung, male    DOB: [birth year]  Age: 75 y.o. MRN: [id number]  CC: URI and Cough   HPI Thomas Chung presents for follow-up on recent upper respiratory infection. He is getting better. He has a mild, persistent cough that is productive of clear to white phlegm. He denies shortness of breath, sore throat, fever, chills, night sweats.  Outpatient Prescriptions Prior to Visit  Medication Sig Dispense Refill  . aspirin 81 MG tablet Take 81 mg by mouth daily.      Marland Kitchen atorvastatin (LIPITOR) 40 MG tablet TAKE 1 TABLET BY MOUTH EVERY DAY 90 tablet 3  . Benfotiamine 150 MG CAPS Take by mouth 2 (two) times daily.    . COD LIVER OIL PO Take by mouth.    Marland Kitchen glucose blood (BAYER CONTOUR NEXT TEST) test strip Use to test blood sugar once daily ICD 10 code E11 8 100 each 3  . HYDROcodone-homatropine (HYCODAN) 5-1.5 MG/5ML syrup Take 5 mLs by mouth every 8 (eight) hours as needed for cough. 120 mL 0  . Lancets MISC Test twice daily 100 each 11  . losartan (COZAAR) 100 MG tablet Take 1 tablet (100 mg total) by mouth daily. 90 tablet 3  . metoprolol succinate (TOPROL-XL) 50 MG 24 hr tablet TAKE 1 TABLET BY MOUTH ONCE DAILY 90 tablet 3  . amoxicillin-clavulanate (AUGMENTIN) 875-125 MG tablet Take 1 tablet by mouth 2 (two) times daily. 14 tablet 0   No facility-administered medications prior to visit.    ROS Review of Systems  Constitutional: Negative.  Negative for fever, chills, diaphoresis, appetite change and fatigue.  HENT: Negative.  Negative for congestion, rhinorrhea, sinus pressure, sore throat and trouble swallowing.   Eyes: Negative.   Respiratory: Positive for cough. Negative for choking, chest tightness, shortness of breath, wheezing and stridor.   Cardiovascular: Negative.  Negative for chest pain, palpitations and leg swelling.  Gastrointestinal: Negative.  Negative for nausea, vomiting, abdominal pain, diarrhea, constipation and blood in stool.    Endocrine: Negative.   Genitourinary: Negative.   Musculoskeletal: Negative.   Skin: Negative.  Negative for color change and rash.  Allergic/Immunologic: Negative.   Neurological: Negative.  Negative for dizziness.  Hematological: Negative.  Negative for adenopathy. Does not bruise/bleed easily.  Psychiatric/Behavioral: Negative.     Objective:  BP 112/70 mmHg  Pulse 68  Temp(Src) 98.2 F (36.8 C) (Oral)  Resp 16  Ht 6\' 4"  (1.93 m)  Wt 244 lb (110.678 kg)  BMI 29.71 kg/m2  SpO2 95%  BP Readings from Last 3 Encounters:  02/07/16 112/70  01/28/16 140/84  01/11/16 108/64    Wt Readings from Last 3 Encounters:  02/07/16 244 lb (110.678 kg)  01/28/16 254 lb (115.214 kg)  01/11/16 249 lb 12 oz (113.286 kg)    Physical Exam  Constitutional: He is oriented to person, place, and time. He appears well-developed and well-nourished. No distress.  HENT:  Mouth/Throat: Oropharynx is clear and moist. No oropharyngeal exudate.  Eyes: Conjunctivae are normal. Right eye exhibits no discharge. Left eye exhibits no discharge. No scleral icterus.  Neck: Normal range of motion. Neck supple. No JVD present. No tracheal deviation present. No thyromegaly present.  Cardiovascular: Normal rate, regular rhythm, normal heart sounds and intact distal pulses.  Exam reveals no gallop and no friction rub.   No murmur heard. Pulmonary/Chest: Effort normal and breath sounds normal. No stridor. No respiratory distress. He has no wheezes. He has no rales. He  exhibits no tenderness.  Abdominal: Soft. Bowel sounds are normal. He exhibits no distension and no mass. There is no tenderness. There is no rebound and no guarding.  Musculoskeletal: Normal range of motion. He exhibits no edema or tenderness.  Lymphadenopathy:    He has no cervical adenopathy.  Neurological: He is oriented to person, place, and time.  Skin: Skin is warm and dry. No rash noted. He is not diaphoretic. No erythema. No pallor.   Vitals reviewed.   Lab Results  Component Value Date   WBC 8.3 06/09/2015   HGB 15.6 06/09/2015   HCT 47.1 06/09/2015   PLT 337.0 06/09/2015   GLUCOSE 106* 01/12/2016   CHOL 115 01/12/2016   TRIG 83.0 01/12/2016   HDL 39.30 01/12/2016   LDLCALC 59 01/12/2016   ALT 28 12/10/2013   AST 26 12/10/2013   NA 144 01/12/2016   K 5.3* 01/12/2016   CL 106 01/12/2016   CREATININE 1.31 01/12/2016   BUN 28* 01/12/2016   CO2 29 01/12/2016   TSH 1.30 01/12/2016   PSA 0.07* 01/12/2016   HGBA1C 6.1 01/12/2016   MICROALBUR 0.9 01/12/2016    Ct Chest W Contrast  09/06/2015  CLINICAL DATA:  Pleural thickening. Abnormal chest x-ray. Smoking exposure. EXAM: CT CHEST WITH CONTRAST TECHNIQUE: Multidetector CT imaging of the chest was performed during intravenous contrast administration. CONTRAST:  80 mL Omnipaque 300 COMPARISON:  Chest x-ray 08/26/2015. FINDINGS: Multiple healed right-sided rib fractures resulted in the appearance of thickened pleura on the right. Posterior healed left-sided rib fractures are noted as well. There is mild thickening of the pleura, worse on the right. The heart is upper limits of normal for size. Coronary artery calcifications are present. There is no significant mediastinal or axillary adenopathy. No significant pleural or pericardial effusion is present. Calcified subcarinal and right hilar lymph nodes are present. There is a calcified left AP window node is well. Atherosclerotic calcifications are present within the aortic arch without aneurysm or significant stenosis of the great vessels. The thoracic inlet is within normal limits. The esophagus is unremarkable. The visualized portions of the liver are normal. The spleen is mildly enlarged with granulomatous calcifications. Gallstones are present without inflammatory changes to suggest cholecystitis. The visualized upper abdomen is otherwise within normal limits. Minimal scarring a bronchiectasis are present at the right  lung base. There is focal calcification compatible with remote granulomatous change. No other focal nodule, mass, or airspace disease is present. Minimal pleural thickening is noted along the areas of previous fracture on the right. There is minimal pleural thickening on the left. Remote healed fractures are present on the left is well. No acute fractures are present. Mild degenerative endplate changes are present in the thoracic spine. IMPRESSION: 1. Minimal pleural thickening is present bilaterally in association with prior fractures. This is limited by the major fissure on both sides and is likely reactive. There is no focal nodularity or enhancement. 2. 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 3. 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 4. Atherosclerotic changes including coronary artery disease. 5. Cholelithiasis without evidence for cholecystitis. 6. No acute cardiopulmonary disease. Electronically Signed   By: San Morelle M.D.   On: 09/06/2015 14:44    Assessment & Plan:   Piper was seen today for uri and cough.  Diagnoses and all orders for this visit:  Viral URI with cough- improvement noted, he does not want to take a cough suppressant.  Cough- His  chest x-ray is normal -     DG Chest 2 View; Future   I have discontinued Mr. Garciamartinez's amoxicillin-clavulanate. I am also having him maintain his aspirin, COD LIVER OIL PO, glucose blood, Lancets, metoprolol succinate, Benfotiamine, atorvastatin, HYDROcodone-homatropine, and losartan.  No orders of the defined types were placed in this encounter.     Follow-up: Return if symptoms worsen or fail to improve.  Scarlette Calico, MD

## 2016-02-07 NOTE — Patient Instructions (Signed)

## 2016-02-08 ENCOUNTER — Encounter: Payer: Self-pay | Admitting: Internal Medicine

## 2016-03-19 ENCOUNTER — Other Ambulatory Visit: Payer: Self-pay | Admitting: Internal Medicine

## 2016-04-26 DIAGNOSIS — I739 Peripheral vascular disease, unspecified: Secondary | ICD-10-CM | POA: Diagnosis not present

## 2016-04-26 DIAGNOSIS — L603 Nail dystrophy: Secondary | ICD-10-CM | POA: Diagnosis not present

## 2016-04-26 DIAGNOSIS — E1151 Type 2 diabetes mellitus with diabetic peripheral angiopathy without gangrene: Secondary | ICD-10-CM | POA: Diagnosis not present

## 2016-05-10 ENCOUNTER — Ambulatory Visit (INDEPENDENT_AMBULATORY_CARE_PROVIDER_SITE_OTHER): Payer: Medicare Other | Admitting: Internal Medicine

## 2016-05-10 ENCOUNTER — Encounter: Payer: Self-pay | Admitting: Internal Medicine

## 2016-05-10 ENCOUNTER — Other Ambulatory Visit (INDEPENDENT_AMBULATORY_CARE_PROVIDER_SITE_OTHER): Payer: Medicare Other

## 2016-05-10 VITALS — BP 120/78 | HR 59 | Temp 97.7°F | Resp 16 | Ht 76.0 in | Wt 251.0 lb

## 2016-05-10 DIAGNOSIS — Z0000 Encounter for general adult medical examination without abnormal findings: Secondary | ICD-10-CM

## 2016-05-10 DIAGNOSIS — E1140 Type 2 diabetes mellitus with diabetic neuropathy, unspecified: Secondary | ICD-10-CM | POA: Diagnosis not present

## 2016-05-10 DIAGNOSIS — E118 Type 2 diabetes mellitus with unspecified complications: Secondary | ICD-10-CM

## 2016-05-10 DIAGNOSIS — I10 Essential (primary) hypertension: Secondary | ICD-10-CM

## 2016-05-10 DIAGNOSIS — E785 Hyperlipidemia, unspecified: Secondary | ICD-10-CM

## 2016-05-10 LAB — CBC WITH DIFFERENTIAL/PLATELET
Basophils Absolute: 0 10*3/uL (ref 0.0–0.1)
Basophils Relative: 0.3 % (ref 0.0–3.0)
Eosinophils Absolute: 0.2 10*3/uL (ref 0.0–0.7)
Eosinophils Relative: 2.8 % (ref 0.0–5.0)
HCT: 42.6 % (ref 39.0–52.0)
Hemoglobin: 14.3 g/dL (ref 13.0–17.0)
Lymphocytes Relative: 14.3 % (ref 12.0–46.0)
Lymphs Abs: 1.1 10*3/uL (ref 0.7–4.0)
MCHC: 33.5 g/dL (ref 30.0–36.0)
MCV: 80.6 fl (ref 78.0–100.0)
Monocytes Absolute: 0.7 10*3/uL (ref 0.1–1.0)
Monocytes Relative: 9.4 % (ref 3.0–12.0)
Neutro Abs: 5.7 10*3/uL (ref 1.4–7.7)
Neutrophils Relative %: 73.2 % (ref 43.0–77.0)
Platelets: 366 10*3/uL (ref 150.0–400.0)
RBC: 5.29 Mil/uL (ref 4.22–5.81)
RDW: 16.8 % — ABNORMAL HIGH (ref 11.5–15.5)
WBC: 7.8 10*3/uL (ref 4.0–10.5)

## 2016-05-10 LAB — FECAL OCCULT BLOOD, GUAIAC: Fecal Occult Blood: NEGATIVE

## 2016-05-10 LAB — COMPREHENSIVE METABOLIC PANEL
ALT: 22 U/L (ref 0–53)
AST: 21 U/L (ref 0–37)
Albumin: 4.4 g/dL (ref 3.5–5.2)
Alkaline Phosphatase: 54 U/L (ref 39–117)
BUN: 24 mg/dL — ABNORMAL HIGH (ref 6–23)
CO2: 27 mEq/L (ref 19–32)
Calcium: 9.2 mg/dL (ref 8.4–10.5)
Chloride: 107 mEq/L (ref 96–112)
Creatinine, Ser: 1.04 mg/dL (ref 0.40–1.50)
GFR: 74.11 mL/min (ref >60.00)
Glucose, Bld: 102 mg/dL — ABNORMAL HIGH (ref 70–99)
Potassium: 4.1 mEq/L (ref 3.5–5.1)
Sodium: 140 mEq/L (ref 135–145)
Total Bilirubin: 0.8 mg/dL (ref 0.2–1.2)
Total Protein: 6.8 g/dL (ref 6.0–8.3)

## 2016-05-10 LAB — HEMOGLOBIN A1C: Hgb A1c MFr Bld: 6.1 % (ref 4.6–6.5)

## 2016-05-10 NOTE — Progress Notes (Signed)
Subjective:  Patient ID: Thomas Chung, male    DOB: Apr 12, 1941  Age: 75 y.o. MRN: [id number]  CC: Annual Exam; Hypertension; and Diabetes   HPI [name] presents for a CPX.  He tells me his blood pressure has been well controlled on the combination of losartan and metoprolol. He has had no recent episodes of chest pain/shortness of breath/palpitations/dyspnea on exertion/edema/fatigue.  Outpatient Prescriptions Prior to Visit  Medication Sig Dispense Refill  . aspirin 81 MG tablet Take 81 mg by mouth daily.      Marland Kitchen atorvastatin (LIPITOR) 40 MG tablet TAKE 1 TABLET BY MOUTH EVERY DAY 90 tablet 3  . Benfotiamine 150 MG CAPS Take by mouth 2 (two) times daily.    . COD LIVER OIL PO Take by mouth.    Marland Kitchen glucose blood (BAYER CONTOUR NEXT TEST) test strip Use to test blood sugar once daily ICD 10 code E11 8 100 each 3  . Lancets MISC Test twice daily 100 each 11  . losartan (COZAAR) 100 MG tablet Take 1 tablet (100 mg total) by mouth daily. 90 tablet 3  . metoprolol succinate (TOPROL-XL) 50 MG 24 hr tablet TAKE 1 TABLET BY MOUTH DAILY 90 tablet 1  . HYDROcodone-homatropine (HYCODAN) 5-1.5 MG/5ML syrup Take 5 mLs by mouth every 8 (eight) hours as needed for cough. 120 mL 0   No facility-administered medications prior to visit.    ROS Review of Systems  Constitutional: Negative.  Negative for chills, activity change, fatigue and unexpected weight change.  HENT: Negative.  Negative for trouble swallowing.   Eyes: Negative.  Negative for visual disturbance.  Respiratory: Negative.  Negative for apnea, cough, chest tightness and wheezing.   Cardiovascular: Negative.  Negative for chest pain, palpitations and leg swelling.  Gastrointestinal: Negative.  Negative for nausea, vomiting, abdominal pain, diarrhea and constipation.  Endocrine: Negative.  Negative for polydipsia, polyphagia and polyuria.  Genitourinary: Negative.  Negative for dysuria, urgency, frequency, difficulty  urinating, genital sores and testicular pain.  Musculoskeletal: Negative.  Negative for myalgias, back pain, joint swelling and arthralgias.  Skin: Negative.  Negative for color change and rash.  Allergic/Immunologic: Negative.   Neurological: Negative.  Negative for dizziness, tremors, syncope, speech difficulty, weakness, light-headedness, numbness and headaches.  Hematological: Negative.  Negative for adenopathy. Does not bruise/bleed easily.  Psychiatric/Behavioral: Negative.     Objective:  BP 120/78 mmHg  Pulse 59  Temp(Src) 97.7 F (36.5 C) (Oral)  Resp 16  Ht 6\' 4"  (1.93 m)  Wt 251 lb (113.853 kg)  BMI 30.57 kg/m2  SpO2 92%  BP Readings from Last 3 Encounters:  05/10/16 120/78  02/07/16 112/70  01/28/16 140/84    Wt Readings from Last 3 Encounters:  05/10/16 251 lb (113.853 kg)  02/07/16 244 lb (110.678 kg)  01/28/16 254 lb (115.214 kg)    Physical Exam  Constitutional: He is oriented to person, place, and time. He appears well-developed and well-nourished. No distress.  HENT:  Mouth/Throat: Oropharynx is clear and moist. No oropharyngeal exudate.  Eyes: Conjunctivae are normal. Right eye exhibits no discharge. Left eye exhibits no discharge. No scleral icterus.  Neck: Normal range of motion. Neck supple. No JVD present. No tracheal deviation present. No thyromegaly present.  Cardiovascular: Normal rate, regular rhythm, normal heart sounds and intact distal pulses.  Exam reveals no gallop and no friction rub.   No murmur heard. EKG ----  Sinus  Bradycardia  WITHIN NORMAL LIMITS  Pulmonary/Chest: Effort normal and breath sounds  normal. No stridor. No respiratory distress. He has no wheezes. He has no rales. He exhibits no tenderness.  Abdominal: Soft. Bowel sounds are normal. He exhibits no distension and no mass. There is no tenderness. There is no rebound and no guarding. Hernia confirmed negative in the right inguinal area and confirmed negative in the left  inguinal area.  Genitourinary: Rectum normal, testes normal and penis normal. Rectal exam shows no external hemorrhoid, no internal hemorrhoid, no fissure, no mass, no tenderness and anal tone normal. Guaiac negative stool. Prostate is enlarged (1+ smooth symm BPH). Prostate is not tender. Right testis shows no mass, no swelling and no tenderness. Right testis is descended. Left testis shows no mass, no swelling and no tenderness. Left testis is descended. Circumcised. No penile erythema or penile tenderness. No discharge found.  Musculoskeletal: Normal range of motion. He exhibits no edema or tenderness.  Lymphadenopathy:    He has no cervical adenopathy.       Right: No inguinal adenopathy present.       Left: No inguinal adenopathy present.  Neurological: He is oriented to person, place, and time.  Skin: Skin is warm and dry. No rash noted. He is not diaphoretic. No erythema. No pallor.  Vitals reviewed.   Lab Results  Component Value Date   WBC 7.8 05/10/2016   HGB 14.3 05/10/2016   HCT 42.6 05/10/2016   PLT 366.0 05/10/2016   GLUCOSE 102* 05/10/2016   CHOL 115 01/12/2016   TRIG 83.0 01/12/2016   HDL 39.30 01/12/2016   LDLCALC 59 01/12/2016   ALT 22 05/10/2016   AST 21 05/10/2016   NA 140 05/10/2016   K 4.1 05/10/2016   CL 107 05/10/2016   CREATININE 1.04 05/10/2016   BUN 24* 05/10/2016   CO2 27 05/10/2016   TSH 1.30 01/12/2016   PSA 0.07* 01/12/2016   HGBA1C 6.1 05/10/2016   MICROALBUR 0.9 01/12/2016    Dg Chest 2 View  02/08/2016  CLINICAL DATA:  Cough. EXAM: CHEST  2 VIEW COMPARISON:  CT 09/06/2015.  Chest x-ray 08/26/2015 . FINDINGS: Mediastinum hilar structures normal. Lungs are clear. Cardiomegaly with normal pulmonary vascularity. No focal infiltrate. No pleural effusion or pneumothorax. Multiple right rib fractures. IMPRESSION: 1.  Cardiomegaly, no pulmonary venous congestion. 2. No focal pulmonary infiltrate. Electronically Signed   By: Marcello Moores  Register   On:  02/08/2016 08:09    Assessment & Plan:   Ruthford was seen today for annual exam, hypertension and diabetes.  Diagnoses and all orders for this visit:  HTN (hypertension), benign- his blood pressure is well-controlled, EKG is negative for LVH, electrolytes and renal function are stable. -     EKG 12-Lead -     Comprehensive metabolic panel; Future  Essential hypertension, benign -     Comprehensive metabolic panel; Future -     CBC with Differential/Platelet; Future  Type 2 diabetes mellitus with complication, without long-term current use of insulin ([place])- his A1c is 6.1%, blood sugars are well-controlled. -     Hemoglobin A1c; Future  Hyperlipidemia with target LDL less than 100 -     Cancel: Lipid panel; Future  Diabetic neuropathy, painful ([place])- he has no significant symptoms that need to be treated.  Routine general medical examination at a health care facility   I have discontinued Mr. Hovatter's HYDROcodone-homatropine. I am also having him maintain his aspirin, COD LIVER OIL PO, glucose blood, Lancets, Benfotiamine, atorvastatin, losartan, and metoprolol succinate.  No orders of the defined types  were placed in this encounter.   See AVS for instructions about healthy living and anticipatory guidance.  Follow-up: Return in about 6 months (around 11/09/2016).  Scarlette Calico, MD

## 2016-05-10 NOTE — Progress Notes (Signed)
Pre visit review using our clinic review tool, if applicable. No additional management support is needed unless otherwise documented below in the visit note. 

## 2016-05-10 NOTE — Patient Instructions (Signed)

## 2016-05-11 NOTE — Assessment & Plan Note (Signed)

## 2016-06-12 ENCOUNTER — Encounter: Payer: Self-pay | Admitting: Cardiology

## 2016-06-26 ENCOUNTER — Encounter: Payer: Self-pay | Admitting: Internal Medicine

## 2016-06-27 ENCOUNTER — Other Ambulatory Visit: Payer: Self-pay | Admitting: *Deleted

## 2016-06-27 MED ORDER — GLUCOSE BLOOD VI STRP: ORAL_STRIP | 3 refills | Status: DC

## 2016-06-29 ENCOUNTER — Encounter: Payer: Self-pay | Admitting: Cardiology

## 2016-06-29 ENCOUNTER — Ambulatory Visit (INDEPENDENT_AMBULATORY_CARE_PROVIDER_SITE_OTHER): Payer: Medicare Other | Admitting: Cardiology

## 2016-06-29 VITALS — BP 134/66 | HR 74 | Ht 76.0 in | Wt 256.4 lb

## 2016-06-29 DIAGNOSIS — I10 Essential (primary) hypertension: Secondary | ICD-10-CM

## 2016-06-29 DIAGNOSIS — E785 Hyperlipidemia, unspecified: Secondary | ICD-10-CM | POA: Diagnosis not present

## 2016-06-29 DIAGNOSIS — Z8249 Family history of ischemic heart disease and other diseases of the circulatory system: Secondary | ICD-10-CM

## 2016-06-29 NOTE — Progress Notes (Signed)
Cardiology Office Note    Date:  06/29/2016   ID:  Thomas Chung, DOB 07-20-1941, MRN [id number]  PCP:  Scarlette Calico, MD  Cardiologist:  Fransico Him, MD   Chief Complaint  Patient presents with  . Hypertension  . Hyperlipidemia    History of Present Illness:  Thomas Chung is a 75 y.o. male with a history of HTN and dyslipidemia.  He denies any chest pain or pressure, DOE or SOB. He denies any LE edema, palpitations or syncope.He says that he does aerobics 4 days weekly and Tai Chi twice weekly.He does have a family history of CAD and sees me yearly for evaluation.    Past Medical History:  Diagnosis Date  . Depression   . Fatty liver   . Glucose intolerance (impaired glucose tolerance)   . HOH (hard of hearing)    bilateral hearing aides  . Hx of colonic polyps   . Hyperlipidemia   . Hypertension   . Neuromuscular disorder ([place])    neuropathy feet    Past Surgical History:  Procedure Laterality Date  . COLONOSCOPY  2006, 2009, 07/28/2011   2006 12 and 7 mm TVadenoma and adenoma 2009: 3 small adenomas 2012,:39m rectal polyp  . COLONOSCOPY    . ORIF FINGER FRACTURE  02/08/2012   Procedure: OPEN REDUCTION INTERNAL FIXATION (ORIF) METACARPAL (FINGER) FRACTURE;  Surgeon: RCammie Sickle, MD;  Location: [facility]  Service: Orthopedics;  Laterality: Right;  right small finger middle phalanx  . SQUAMOUS CELL CARCINOMA EXCISION    . TIBIA FRACTURE SURGERY  2001   right with hardware  . VASECTOMY    . WRIST FRACTURE SURGERY  2002   right    Current Medications: Outpatient Medications Prior to Visit  Medication Sig Dispense Refill  . aspirin 81 MG tablet Take 81 mg by mouth daily.      .Marland Kitchenatorvastatin (LIPITOR) 40 MG tablet TAKE 1 TABLET BY MOUTH EVERY DAY 90 tablet 3  . Benfotiamine 150 MG CAPS Take by mouth 2 (two) times daily.    . COD LIVER OIL PO Take by mouth.    .Marland Kitchenglucose blood (BAYER CONTOUR NEXT TEST) test strip Use to test blood  sugar once daily ICD 10 code E11. 8 100 each 3  . Lancets MISC Test twice daily 100 each 11  . losartan (COZAAR) 100 MG tablet Take 1 tablet (100 mg total) by mouth daily. 90 tablet 3  . metoprolol succinate (TOPROL-XL) 50 MG 24 hr tablet TAKE 1 TABLET BY MOUTH DAILY 90 tablet 1   No facility-administered medications prior to visit.      Allergies:   Prevnar [pneumococcal 13-val conj vacc]   Social History   Social History  . Marital status: Married    Spouse name: N/A  . Number of children: N/A  . Years of education: N/A   Occupational History  . retired Retired   Social History Main Topics  . Smoking status: Former SResearch scientist (life sciences) . Smokeless tobacco: Never Used  . Alcohol use 1.8 oz/week    3 Standard drinks or equivalent per week  . Drug use: No  . Sexual activity: Yes   Other Topics Concern  . None   Social History Narrative   No regular exercise     Family History:  The patient's family history includes Arrhythmia in his mother; Heart attack in his mother; Heart disease in his mother; Heart failure in his father.   ROS:  Please see the history of present illness.    Review of Systems  HENT: Positive for hearing loss.    All other systems reviewed and are negative.   PHYSICAL EXAM:   VS:  BP 134/66   Pulse 74   Ht 6' 4" (1.93 m)   Wt 256 lb 6.4 oz (116.3 kg)   SpO2 96%   BMI 31.21 kg/m    GEN: Well nourished, well developed, in no acute distress  HEENT: normal  Neck: no JVD, carotid bruits, or masses Cardiac: RRR; no murmurs, rubs, or gallops,no edema.  Intact distal pulses bilaterally.  Respiratory:  clear to auscultation bilaterally, normal work of breathing GI: soft, nontender, nondistended, + BS MS: no deformity or atrophy  Skin: warm and dry, no rash Neuro:  Alert and Oriented x 3, Strength and sensation are intact Psych: euthymic mood, full affect  Wt Readings from Last 3 Encounters:  06/29/16 256 lb 6.4 oz (116.3 kg)  05/10/16 251 lb (113.9 kg)    02/07/16 244 lb (110.7 kg)      Studies/Labs Reviewed:   EKG:  EKG is not ordered today.    Recent Labs: 01/12/2016: TSH 1.30 05/10/2016: ALT 22; BUN 24; Creatinine, Ser 1.04; Hemoglobin 14.3; Platelets 366.0; Potassium 4.1; Sodium 140   Lipid Panel    Component Value Date/Time   CHOL 115 01/12/2016 0759   TRIG 83.0 01/12/2016 0759   HDL 39.30 01/12/2016 0759   CHOLHDL 3 01/12/2016 0759   VLDL 16.6 01/12/2016 0759   LDLCALC 59 01/12/2016 0759    Additional studies/ records that were reviewed today include:  none    ASSESSMENT:    1. Essential hypertension, benign   2. Family history of early CAD   3. Hyperlipidemia with target LDL less than 100      PLAN:  In order of problems listed above:  1. HTN - BP controlled on current meds. Continue ARB and BB. 2. Family history of CAD - he had a normal stress test last year and is asymptomatic from a cardiac standpoint.  3. Hyperlipidemia - LDL is at goal at 59.  Continue statin.    Medication Adjustments/Labs and Tests Ordered: Current medicines are reviewed at length with the patient today.  Concerns regarding medicines are outlined above.  Medication changes, Labs and Tests ordered today are listed in the Patient Instructions below.  There are no Patient Instructions on file for this visit.   Signed, Fransico Him, MD  06/29/2016 2:51 PM    [facility] [address], [place], [place]  [address] Phone: [phone]; Fax: [phone]

## 2016-06-29 NOTE — Patient Instructions (Signed)

## 2016-07-19 IMAGING — CT CT CHEST W/ CM
2 of 3 series · 15 of 36 positions shown, 18 images · IV contrast (Omnipaque 300)
Comparison: Chest x-ray 08/26/2015.

CLINICAL DATA: Pleural thickening. Abnormal chest x-ray. Smoking
exposure.

EXAM:
CT CHEST WITH CONTRAST
TECHNIQUE: Multidetector CT imaging of the chest was performed during
intravenous contrast administration.
CONTRAST:  80 mL Omnipaque 300

[Series 2: chest routine with · axial · 0.77mm/px · z∈[-332,-72]mm · 12 of 62 slices shown, 15 images]
[im 5/62  mediastinal]
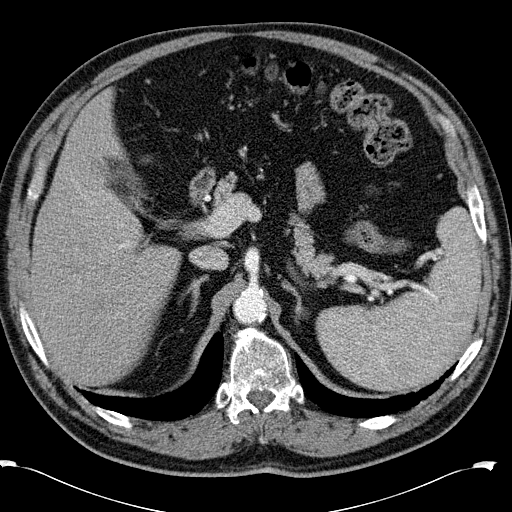
[im 5/62  lung]
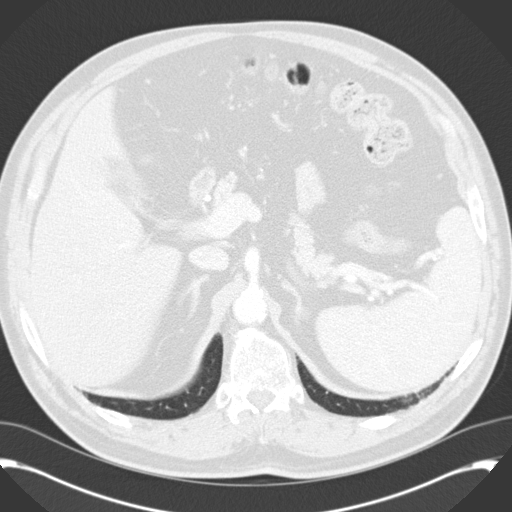
[im 10/62  lung]
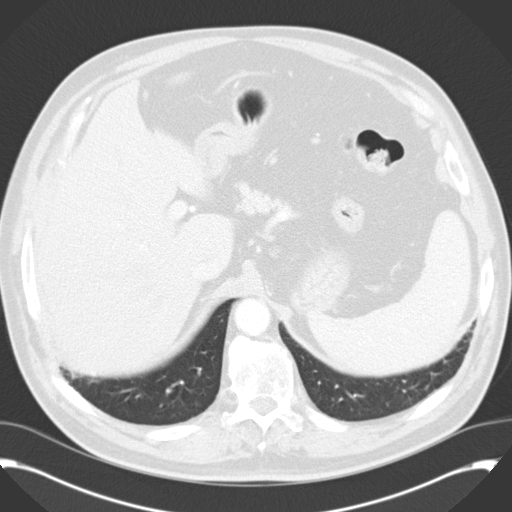
[im 14/62  lung]
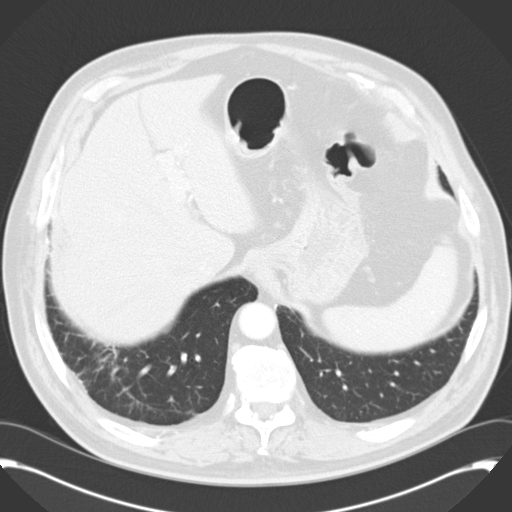
[im 19/62  lung]
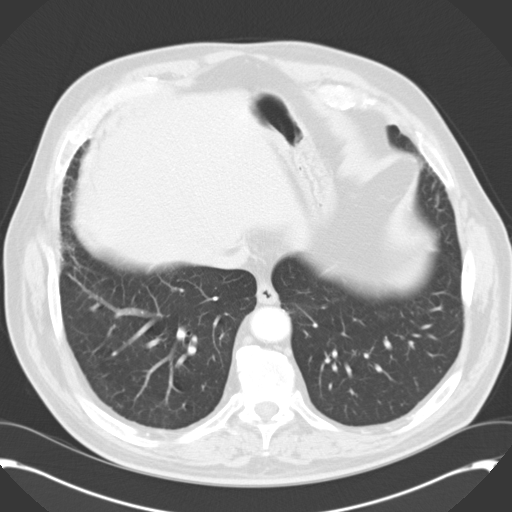
[im 23/62  mediastinal]
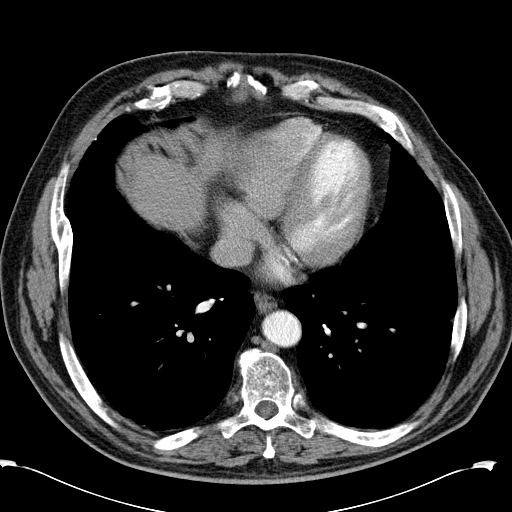
[im 23/62  lung]
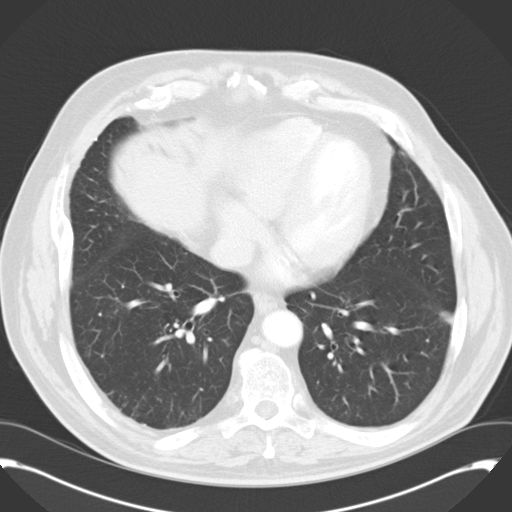
[im 28/62  lung]
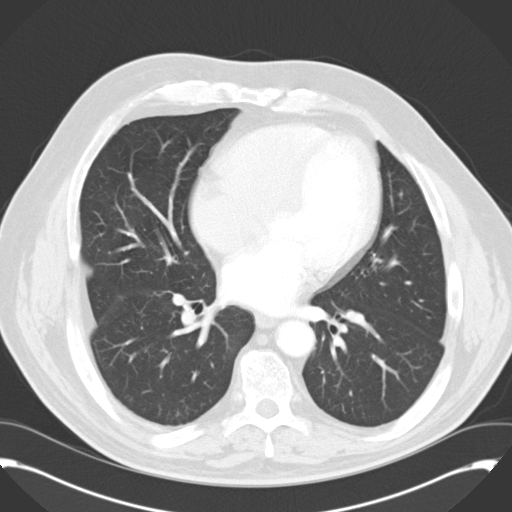
[im 34/62  lung]
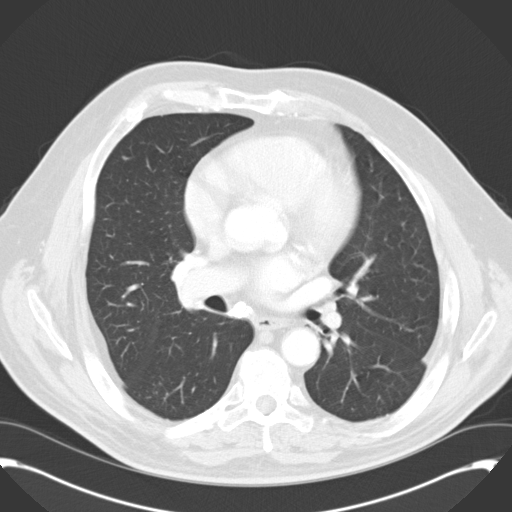
[im 39/62  lung]
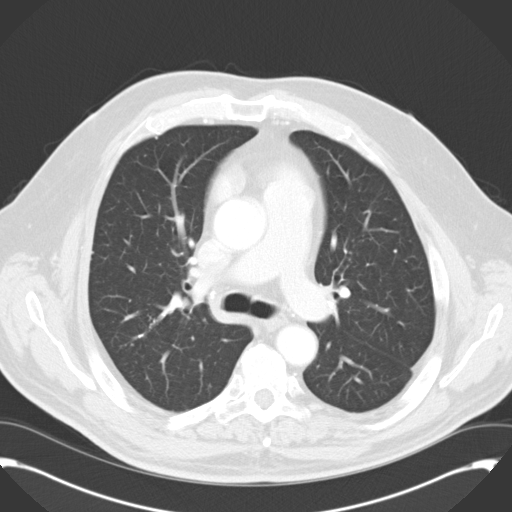
[im 43/62  mediastinal]
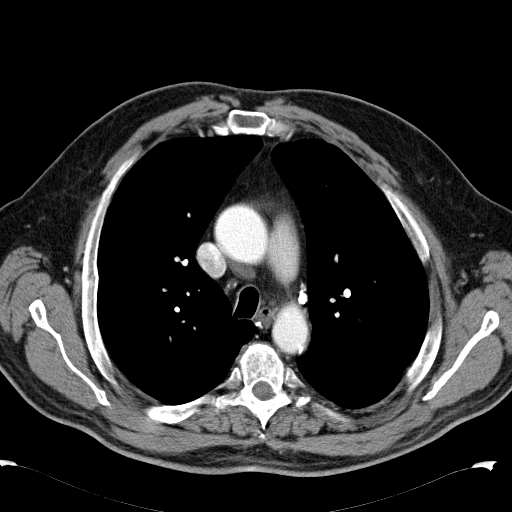
[im 43/62  lung]
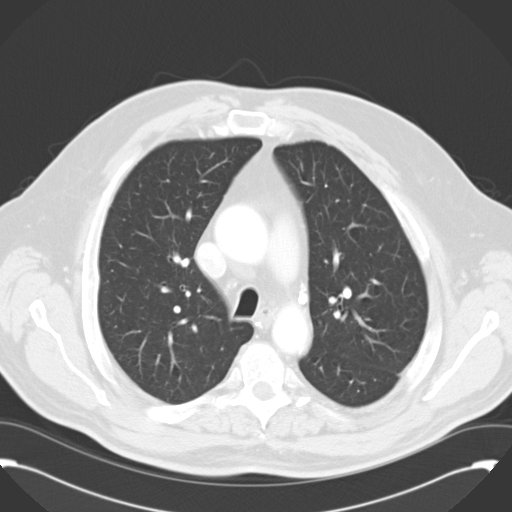
[im 48/62  lung]
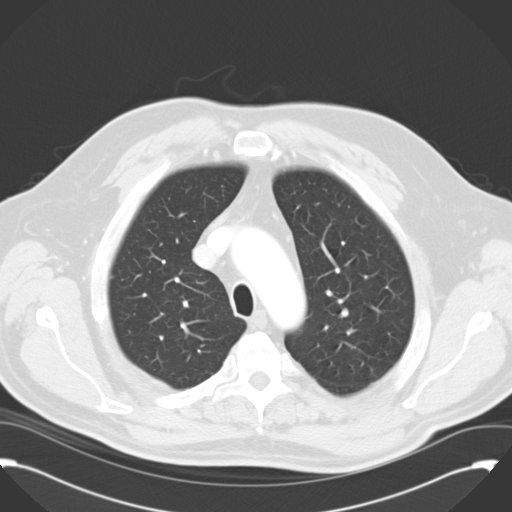
[im 52/62  lung]
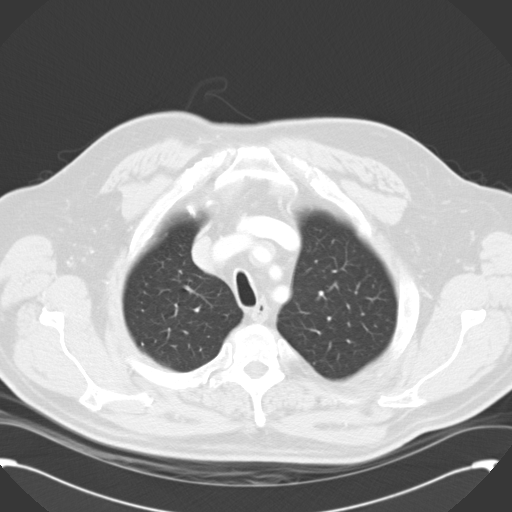
[im 57/62  lung]
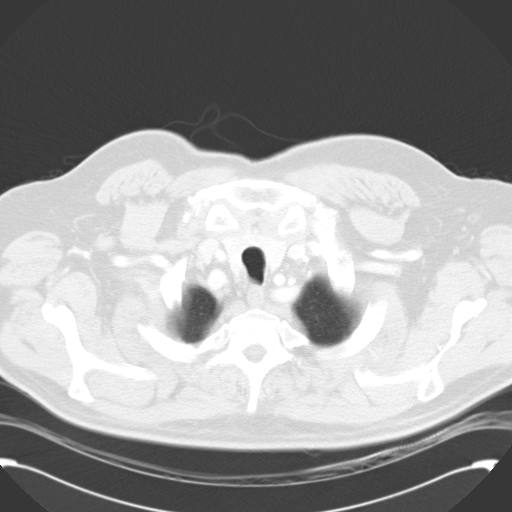

[Series 602: cor · coronal · 0.84mm/px · 3 of 149 slices shown]
[im 30/149  lung]
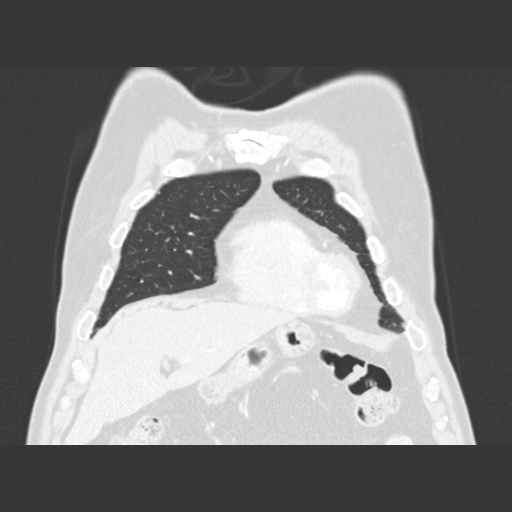
[im 60/149  lung]
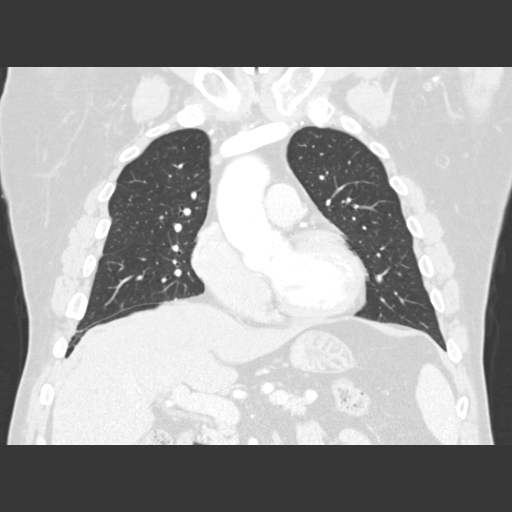
[im 89/149  lung]
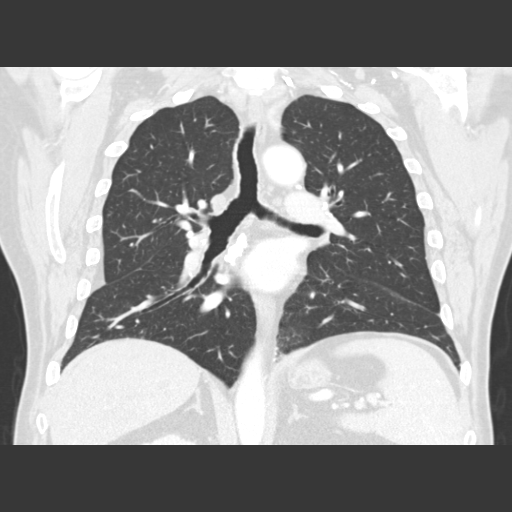

[15 of 36 positions shown; findings below may reference images not displayed]

FINDINGS: Multiple healed right-sided rib fractures resulted in the appearance
of thickened pleura on the right. Posterior healed left-sided rib
fractures are noted as well. There is mild thickening of the pleura,
worse on the right. The heart is upper limits of normal for size.
Coronary artery calcifications are present. There is no significant
mediastinal or axillary adenopathy. No significant pleural or
pericardial effusion is present. Calcified subcarinal and right
hilar lymph nodes are present. There is a calcified left AP window
node is well.

Atherosclerotic calcifications are present within the aortic arch
without aneurysm or significant stenosis of the great vessels.

The thoracic inlet is within normal limits. The esophagus is
unremarkable.

The visualized portions of the liver are normal. The spleen is
mildly enlarged with granulomatous calcifications. Gallstones are
present without inflammatory changes to suggest cholecystitis. The
visualized upper abdomen is otherwise within normal limits.

Minimal scarring a bronchiectasis are present at the right lung
base. There is focal calcification compatible with remote
granulomatous change. No other focal nodule, mass, or airspace
disease is present. Minimal pleural thickening is noted along the
areas of previous fracture on the right. There is minimal pleural
thickening on the left. Remote healed fractures are present on the
left is well. No acute fractures are present. Mild degenerative
endplate changes are present in the thoracic spine.
IMPRESSION: 1. Minimal pleural thickening is present bilaterally in association
with prior fractures. This is limited by the major fissure on both
sides and is likely reactive. There is no focal nodularity or
enhancement.
2. Granulomatous changes with calcified mediastinal lymph nodes and
remote scarring at the right lung base.
3. Granulomatous calcifications in the spleen.
4. Atherosclerotic changes including coronary artery disease.
5. Cholelithiasis without evidence for cholecystitis.
6. No acute cardiopulmonary disease.

## 2016-07-20 DIAGNOSIS — E1142 Type 2 diabetes mellitus with diabetic polyneuropathy: Secondary | ICD-10-CM | POA: Diagnosis not present

## 2016-07-20 DIAGNOSIS — L603 Nail dystrophy: Secondary | ICD-10-CM | POA: Diagnosis not present

## 2016-07-20 DIAGNOSIS — I739 Peripheral vascular disease, unspecified: Secondary | ICD-10-CM | POA: Diagnosis not present

## 2016-07-28 ENCOUNTER — Telehealth: Payer: Self-pay | Admitting: *Deleted

## 2016-07-28 MED ORDER — GLUCOSE BLOOD VI STRP: ORAL_STRIP | 3 refills | Status: DC

## 2016-07-28 NOTE — Telephone Encounter (Signed)
Pt left msg on triage stating pharmacy has sent fax needing to know how many x's he test BS. Fax was to Dr. Linna Darner but need Dr. Ronnald Ramp to send back. Sent updated rx to [facility]...Thomas Chung

## 2016-08-02 ENCOUNTER — Encounter: Payer: Self-pay | Admitting: Internal Medicine

## 2016-08-09 ENCOUNTER — Encounter: Payer: Self-pay | Admitting: Internal Medicine

## 2016-08-09 ENCOUNTER — Ambulatory Visit (INDEPENDENT_AMBULATORY_CARE_PROVIDER_SITE_OTHER): Payer: Medicare Other

## 2016-08-09 DIAGNOSIS — Z23 Encounter for immunization: Secondary | ICD-10-CM | POA: Diagnosis not present

## 2016-09-06 ENCOUNTER — Other Ambulatory Visit: Payer: Self-pay | Admitting: Internal Medicine

## 2016-09-12 DIAGNOSIS — C44619 Basal cell carcinoma of skin of left upper limb, including shoulder: Secondary | ICD-10-CM | POA: Diagnosis not present

## 2016-09-12 DIAGNOSIS — D225 Melanocytic nevi of trunk: Secondary | ICD-10-CM | POA: Diagnosis not present

## 2016-09-12 DIAGNOSIS — L918 Other hypertrophic disorders of the skin: Secondary | ICD-10-CM | POA: Diagnosis not present

## 2016-09-12 DIAGNOSIS — Z85828 Personal history of other malignant neoplasm of skin: Secondary | ICD-10-CM | POA: Diagnosis not present

## 2016-09-12 DIAGNOSIS — L821 Other seborrheic keratosis: Secondary | ICD-10-CM | POA: Diagnosis not present

## 2016-09-12 DIAGNOSIS — L570 Actinic keratosis: Secondary | ICD-10-CM | POA: Diagnosis not present

## 2016-09-12 DIAGNOSIS — D485 Neoplasm of uncertain behavior of skin: Secondary | ICD-10-CM | POA: Diagnosis not present

## 2016-09-12 DIAGNOSIS — L853 Xerosis cutis: Secondary | ICD-10-CM | POA: Diagnosis not present

## 2016-09-21 ENCOUNTER — Ambulatory Visit (AMBULATORY_SURGERY_CENTER): Payer: Self-pay

## 2016-09-21 VITALS — Ht 76.0 in | Wt 258.6 lb

## 2016-09-21 DIAGNOSIS — Z86010 Personal history of colonic polyps: Secondary | ICD-10-CM

## 2016-09-21 NOTE — Progress Notes (Signed)
Per pt, no allergies to soy or egg products.Pt not taking any weight loss meds or using  O2 at home. 

## 2016-09-27 ENCOUNTER — Encounter: Payer: Self-pay | Admitting: Internal Medicine

## 2016-10-05 ENCOUNTER — Encounter: Payer: Self-pay | Admitting: Internal Medicine

## 2016-10-05 ENCOUNTER — Ambulatory Visit (AMBULATORY_SURGERY_CENTER): Payer: Medicare Other | Admitting: Internal Medicine

## 2016-10-05 VITALS — BP 111/65 | HR 48 | Temp 98.2°F | Resp 10 | Ht 76.0 in | Wt 258.0 lb

## 2016-10-05 DIAGNOSIS — Z86010 Personal history of colonic polyps: Secondary | ICD-10-CM

## 2016-10-05 DIAGNOSIS — E119 Type 2 diabetes mellitus without complications: Secondary | ICD-10-CM | POA: Diagnosis not present

## 2016-10-05 DIAGNOSIS — I10 Essential (primary) hypertension: Secondary | ICD-10-CM | POA: Diagnosis not present

## 2016-10-05 LAB — HM COLONOSCOPY

## 2016-10-05 MED ORDER — SODIUM CHLORIDE 0.9 % IV SOLN: 500.0000 mL | INTRAVENOUS | Status: DC

## 2016-10-05 NOTE — Patient Instructions (Addendum)
   No polyps today.  Could consider repeat colonoscopy vs. other testing in 5 years if fit.  I appreciate the opportunity to care for you. Gatha Mayer, MD, FACG  YOU HAD AN ENDOSCOPIC PROCEDURE TODAY AT [facility]:   Refer to the procedure report that was given to you for any specific questions about what was found during the examination.  If the procedure report does not answer your questions, please call your gastroenterologist to clarify.  If you requested that your care partner not be given the details of your procedure findings, then the procedure report has been included in a sealed envelope for you to review at your convenience later.  YOU SHOULD EXPECT: Some feelings of bloating in the abdomen. Passage of more gas than usual.  Walking can help get rid of the air that was put into your GI tract during the procedure and reduce the bloating. If you had a lower endoscopy (such as a colonoscopy or flexible sigmoidoscopy) you may notice spotting of blood in your stool or on the toilet paper. If you underwent a bowel prep for your procedure, you may not have a normal bowel movement for a few days.  Please Note:  You might notice some irritation and congestion in your nose or some drainage.  This is from the oxygen used during your procedure.  There is no need for concern and it should clear up in a day or so.  SYMPTOMS TO REPORT IMMEDIATELY:   Following lower endoscopy (colonoscopy or flexible sigmoidoscopy):  Excessive amounts of blood in the stool  Significant tenderness or worsening of abdominal pains  Swelling of the abdomen that is new, acute  Fever of 100F or higher  For urgent or emergent issues, a gastroenterologist can be reached at any hour by calling [phone].  DIET:  We do recommend a small meal at first, but then you may proceed to your regular diet.  Drink plenty of fluids but you should avoid alcoholic beverages for 24 hours.  ACTIVITY:   You should plan to take it easy for the rest of today and you should NOT DRIVE or use heavy machinery until tomorrow (because of the sedation medicines used during the test).    FOLLOW UP: Our staff will call the number listed on your records the next business day following your procedure to check on you and address any questions or concerns that you may have regarding the information given to you following your procedure. If we do not reach you, we will leave a message.  However, if you are feeling well and you are not experiencing any problems, there is no need to return our call.  We will assume that you have returned to your regular daily activities without incident.  SIGNATURES/CONFIDENTIALITY: You and/or your care partner have signed paperwork which will be entered into your electronic medical record.  These signatures attest to the fact that that the information above on your After Visit Summary has been reviewed and is understood.  Full responsibility of the confidentiality of this discharge information lies with you and/or your care-partner.  Next colonoscopy- 5 years  Please continue your normal medications

## 2016-10-05 NOTE — Op Note (Signed)
[facility] Patient Name: Thomas Chung Procedure Date: 10/05/2016 11:11 AM MRN: [id number] Endoscopist: Gatha Mayer , MD Age: 75 Referring MD:  Date of Birth: April 21, 1941 Gender: Male Account #: [id number] Procedure:                Colonoscopy Indications:              Surveillance: Personal history of adenomatous                            polyps on last colonoscopy 5 years ago Medicines:                Propofol per Anesthesia, Monitored Anesthesia Care Procedure:                Pre-Anesthesia Assessment:                           - Prior to the procedure, a History and Physical                            was performed, and patient medications and                            allergies were reviewed. The patient's tolerance of                            previous anesthesia was also reviewed. The risks                            and benefits of the procedure and the sedation                            options and risks were discussed with the patient.                            All questions were answered, and informed consent                            was obtained. Prior Anticoagulants: The patient                            last took aspirin on the day of the procedure. ASA                            Grade Assessment: II - A patient with mild systemic                            disease. After reviewing the risks and benefits,                            the patient was deemed in satisfactory condition to                            undergo the procedure.  After obtaining informed consent, the colonoscope                            was passed under direct vision. Throughout the                            procedure, the patient's blood pressure, pulse, and                            oxygen saturations were monitored continuously. The                            Model CF-[id number]) scope was introduced                            through the  anus and advanced to the the cecum,                            identified by appendiceal orifice and ileocecal                            valve. The colonoscopy was technically difficult                            and complex due to significant looping. Successful                            completion of the procedure was aided by changing                            the patient to a prone position and using manual                            pressure. Used forceps to pull cecum into view. The                            quality of the bowel preparation was excellent. The                            bowel preparation used was Miralax. The ileocecal                            valve, appendiceal orifice, and rectum were                            photographed. Scope In: 11:25:43 AM Scope Out: 11:44:31 AM Scope Withdrawal Time: 0 hours 11 minutes 21 seconds  Total Procedure Duration: 0 hours 18 minutes 48 seconds  Findings:                 The perianal and digital rectal examinations were                            normal.  The entire examined colon appeared normal on direct                            and retroflexion views. It was redundant and                            pressure, position change and forceps manipulation                            were used. Complications:            No immediate complications. Estimated Blood Loss:     Estimated blood loss: none. Impression:               - The entire examined colon is normal on direct and                            retroflexion views.                           - No specimens collected.                           - Redundant colon. Recommendation:           - Patient has a contact number available for                            emergencies. The signs and symptoms of potential                            delayed complications were discussed with the                            patient. Return to normal activities  tomorrow.                            Written discharge instructions were provided to the                            patient.                           - Resume previous diet.                           - Continue present medications.                           - Repeat colonoscopy in 5 years for surveillance.                           - POSSIBLE REPEAT EXAM IN 5 YRS IF FIT Gatha Mayer, MD 10/05/2016 11:51:23 AM This report has been signed electronically.

## 2016-10-05 NOTE — Progress Notes (Signed)
To recovery Report to [facility]

## 2016-10-06 ENCOUNTER — Telehealth: Payer: Self-pay

## 2016-10-06 NOTE — Telephone Encounter (Signed)
  Follow up Call-  Call back number 10/05/2016  Post procedure Call Back phone  # [phone]  Permission to leave phone message Yes  Some recent data might be hidden     Patient questions:  Do you have a fever, pain , or abdominal swelling? No. Pain Score  0 *  Have you tolerated food without any problems? Yes.    Have you been able to return to your normal activities? Yes.    Do you have any questions about your discharge instructions: Diet   No. Medications  No. Follow up visit  No.  Do you have questions or concerns about your Care? No.  Actions: * If pain score is 4 or above: No action needed, pain <4.

## 2016-10-16 DIAGNOSIS — L603 Nail dystrophy: Secondary | ICD-10-CM | POA: Diagnosis not present

## 2016-10-16 DIAGNOSIS — I739 Peripheral vascular disease, unspecified: Secondary | ICD-10-CM | POA: Diagnosis not present

## 2016-10-16 DIAGNOSIS — E1151 Type 2 diabetes mellitus with diabetic peripheral angiopathy without gangrene: Secondary | ICD-10-CM | POA: Diagnosis not present

## 2016-10-25 DIAGNOSIS — E119 Type 2 diabetes mellitus without complications: Secondary | ICD-10-CM | POA: Diagnosis not present

## 2016-10-25 DIAGNOSIS — H02831 Dermatochalasis of right upper eyelid: Secondary | ICD-10-CM | POA: Diagnosis not present

## 2016-10-25 DIAGNOSIS — H02834 Dermatochalasis of left upper eyelid: Secondary | ICD-10-CM | POA: Diagnosis not present

## 2016-10-25 DIAGNOSIS — H35033 Hypertensive retinopathy, bilateral: Secondary | ICD-10-CM | POA: Diagnosis not present

## 2016-10-25 LAB — HM DIABETES EYE EXAM

## 2016-11-01 DIAGNOSIS — M7751 Other enthesopathy of right foot: Secondary | ICD-10-CM | POA: Diagnosis not present

## 2016-11-01 DIAGNOSIS — M19071 Primary osteoarthritis, right ankle and foot: Secondary | ICD-10-CM | POA: Diagnosis not present

## 2016-11-01 DIAGNOSIS — M25571 Pain in right ankle and joints of right foot: Secondary | ICD-10-CM | POA: Diagnosis not present

## 2016-11-07 ENCOUNTER — Encounter: Payer: Self-pay | Admitting: Internal Medicine

## 2016-11-08 ENCOUNTER — Ambulatory Visit (INDEPENDENT_AMBULATORY_CARE_PROVIDER_SITE_OTHER): Payer: Medicare Other | Admitting: Internal Medicine

## 2016-11-08 ENCOUNTER — Other Ambulatory Visit (INDEPENDENT_AMBULATORY_CARE_PROVIDER_SITE_OTHER): Payer: Medicare Other

## 2016-11-08 ENCOUNTER — Encounter: Payer: Self-pay | Admitting: Internal Medicine

## 2016-11-08 VITALS — BP 168/78 | HR 68 | Temp 97.9°F | Resp 16 | Ht 76.0 in | Wt 257.0 lb

## 2016-11-08 DIAGNOSIS — E118 Type 2 diabetes mellitus with unspecified complications: Secondary | ICD-10-CM

## 2016-11-08 DIAGNOSIS — I10 Essential (primary) hypertension: Secondary | ICD-10-CM | POA: Diagnosis not present

## 2016-11-08 DIAGNOSIS — Z23 Encounter for immunization: Secondary | ICD-10-CM

## 2016-11-08 LAB — BASIC METABOLIC PANEL
BUN: 21 mg/dL (ref 6–23)
CO2: 29 mEq/L (ref 19–32)
Calcium: 9.6 mg/dL (ref 8.4–10.5)
Chloride: 104 mEq/L (ref 96–112)
Creatinine, Ser: 1.23 mg/dL (ref 0.40–1.50)
GFR: 60.98 mL/min (ref >60.00)
Glucose, Bld: 117 mg/dL — ABNORMAL HIGH (ref 70–99)
Potassium: 4.2 mEq/L (ref 3.5–5.1)
Sodium: 141 mEq/L (ref 135–145)

## 2016-11-08 LAB — URINALYSIS, ROUTINE W REFLEX MICROSCOPIC
Bilirubin Urine: NEGATIVE
Hgb urine dipstick: NEGATIVE
Ketones, ur: NEGATIVE
Leukocytes, UA: NEGATIVE
Nitrite: NEGATIVE
Specific Gravity, Urine: 1.02 (ref 1.000–1.030)
Total Protein, Urine: NEGATIVE
Urine Glucose: NEGATIVE
Urobilinogen, UA: 0.2 (ref 0.0–1.0)
pH: 6 (ref 5.0–8.0)

## 2016-11-08 LAB — HEMOGLOBIN A1C: Hgb A1c MFr Bld: 6.3 % (ref 4.6–6.5)

## 2016-11-08 MED ORDER — AZILSARTAN-CHLORTHALIDONE 40-12.5 MG PO TABS: 1.0000 | ORAL_TABLET | Freq: Every day | ORAL | 0 refills | Status: DC

## 2016-11-08 NOTE — Patient Instructions (Signed)
Hypertension Hypertension, commonly called high blood pressure, is when the force of blood pumping through your arteries is too strong. Your arteries are the blood vessels that carry blood from your heart throughout your body. A blood pressure reading consists of a higher number over a lower number, such as 110/72. The higher number (systolic) is the pressure inside your arteries when your heart pumps. The lower number (diastolic) is the pressure inside your arteries when your heart relaxes. Ideally you want your blood pressure below 120/80. Hypertension forces your heart to work harder to pump blood. Your arteries may become narrow or stiff. Having untreated or uncontrolled hypertension can cause heart attack, stroke, kidney disease, and other problems. What increases the risk? Some risk factors for high blood pressure are controllable. Others are not. Risk factors you cannot control include:  Race. You may be at higher risk if you are African American.  Age. Risk increases with age.  Gender. Men are at higher risk than women before age 45 years. After age 65, women are at higher risk than men. Risk factors you can control include:  Not getting enough exercise or physical activity.  Being overweight.  Getting too much fat, sugar, calories, or salt in your diet.  Drinking too much alcohol. What are the signs or symptoms? Hypertension does not usually cause signs or symptoms. Extremely high blood pressure (hypertensive crisis) may cause headache, anxiety, shortness of breath, and nosebleed. How is this diagnosed? To check if you have hypertension, your health care provider will measure your blood pressure while you are seated, with your arm held at the level of your heart. It should be measured at least twice using the same arm. Certain conditions can cause a difference in blood pressure between your right and left arms. A blood pressure reading that is higher than normal on one occasion does  not mean that you need treatment. If it is not clear whether you have high blood pressure, you may be asked to return on a different day to have your blood pressure checked again. Or, you may be asked to monitor your blood pressure at home for 1 or more weeks. How is this treated? Treating high blood pressure includes making lifestyle changes and possibly taking medicine. Living a healthy lifestyle can help lower high blood pressure. You may need to change some of your habits. Lifestyle changes may include:  Following the DASH diet. This diet is high in fruits, vegetables, and whole grains. It is low in salt, red meat, and added sugars.  Keep your sodium intake below 2,300 mg per day.  Getting at least 30-45 minutes of aerobic exercise at least 4 times per week.  Losing weight if necessary.  Not smoking.  Limiting alcoholic beverages.  Learning ways to reduce stress. Your health care provider may prescribe medicine if lifestyle changes are not enough to get your blood pressure under control, and if one of the following is true:  You are 18-59 years of age and your systolic blood pressure is above 140.  You are 60 years of age or older, and your systolic blood pressure is above 150.  Your diastolic blood pressure is above 90.  You have diabetes, and your systolic blood pressure is over 140 or your diastolic blood pressure is over 90.  You have kidney disease and your blood pressure is above 140/90.  You have heart disease and your blood pressure is above 140/90. Your personal target blood pressure may vary depending on your medical   conditions, your age, and other factors. Follow these instructions at home:  Have your blood pressure rechecked as directed by your health care provider.  Take medicines only as directed by your health care provider. Follow the directions carefully. Blood pressure medicines must be taken as prescribed. The medicine does not work as well when you skip  doses. Skipping doses also puts you at risk for problems.  Do not smoke.  Monitor your blood pressure at home as directed by your health care provider. Contact a health care provider if:  You think you are having a reaction to medicines taken.  You have recurrent headaches or feel dizzy.  You have swelling in your ankles.  You have trouble with your vision. Get help right away if:  You develop a severe headache or confusion.  You have unusual weakness, numbness, or feel faint.  You have severe chest or abdominal pain.  You vomit repeatedly.  You have trouble breathing. This information is not intended to replace advice given to you by your health care provider. Make sure you discuss any questions you have with your health care provider. Document Released: 11/06/2005 Document Revised: 04/13/2016 Document Reviewed: 08/29/2013 Elsevier Interactive Patient Education  2017 [employer]  

## 2016-11-08 NOTE — Progress Notes (Signed)
Subjective:  Patient ID: Thomas Chung, male    DOB: 12-05-1940  Age: 75 y.o. MRN: [id number]  CC: Hypertension and Diabetes   HPI Thomas Chung presents for f/up. He is concerned that his blood pressure is not adequately well controlled. He has recently gotten a blood pressure at home as high as 200/100. He was asymptomatic with respect to this. He has had no recent episodes of headache/blurred vision/chest pain/shortness of breath/palpitations/edema/fatigue. He is taking losartan and hydrochlorothiazide.  He tells me his blood sugars have been well controlled.  Outpatient Medications Prior to Visit  Medication Sig Dispense Refill  . aspirin 81 MG tablet Take 81 mg by mouth daily.      Marland Kitchen atorvastatin (LIPITOR) 40 MG tablet TAKE 1 TABLET BY MOUTH DAILY 90 tablet 3  . Benfotiamine 150 MG CAPS Take by mouth 2 (two) times daily.    . COD LIVER OIL PO Take by mouth. Take 5 ml daily    . glucose blood (BAYER CONTOUR NEXT TEST) test strip Use to test blood sugar once daily I CD 10 code E11. 8 100 each 3  . Lancets MISC Test twice daily 100 each 11  . metoprolol succinate (TOPROL-XL) 50 MG 24 hr tablet TAKE 1 TABLET BY MOUTH DAILY 90 tablet 3  . losartan (COZAAR) 100 MG tablet Take 1 tablet (100 mg total) by mouth daily. (Patient not taking: Reported on 11/08/2016) 90 tablet 3  . 0.9 %  sodium chloride infusion      No facility-administered medications prior to visit.     ROS Review of Systems  Constitutional: Negative.  Negative for activity change, chills, diaphoresis, fatigue and unexpected weight change.  HENT: Negative.   Eyes: Negative for photophobia and visual disturbance.  Respiratory: Negative for apnea, cough, chest tightness, shortness of breath and wheezing.   Cardiovascular: Negative for chest pain, palpitations and leg swelling.  Gastrointestinal: Negative for abdominal pain, constipation, diarrhea, nausea and vomiting.  Endocrine: Negative.  Negative for  polydipsia, polyphagia and polyuria.  Genitourinary: Negative.  Negative for difficulty urinating, dysuria, frequency, hematuria and urgency.  Musculoskeletal: Negative.  Negative for back pain and neck pain.  Skin: Negative.  Negative for color change and rash.  Allergic/Immunologic: Negative.   Neurological: Negative.  Negative for dizziness, weakness, numbness and headaches.  Hematological: Negative.  Negative for adenopathy. Does not bruise/bleed easily.  Psychiatric/Behavioral: Negative.     Objective:  BP (!) 168/78 (BP Location: Left Arm, Patient Position: Sitting, Cuff Size: Normal)   Pulse 68   Temp 97.9 F (36.6 C) (Oral)   Resp 16   Ht 6\' 4"  (1.93 m)   Wt 257 lb (116.6 kg)   SpO2 96%   BMI 31.28 kg/m   BP Readings from Last 3 Encounters:  11/08/16 (!) 168/78  10/05/16 111/65  06/29/16 134/66    Wt Readings from Last 3 Encounters:  11/08/16 257 lb (116.6 kg)  10/05/16 258 lb (117 kg)  09/21/16 258 lb 9.6 oz (117.3 kg)    Physical Exam  Constitutional: He is oriented to person, place, and time.  HENT:  Mouth/Throat: Oropharynx is clear and moist. No oropharyngeal exudate.  Eyes: Conjunctivae are normal. Right eye exhibits no discharge. Left eye exhibits no discharge. No scleral icterus.  Neck: Normal range of motion. Neck supple. No JVD present. No tracheal deviation present. No thyromegaly present.  Cardiovascular: Normal rate, regular rhythm, normal heart sounds and intact distal pulses.  Exam reveals no gallop and no friction  rub.   No murmur heard. Pulmonary/Chest: Effort normal and breath sounds normal. No stridor. No respiratory distress. He has no wheezes. He has no rales. He exhibits no tenderness.  Abdominal: Soft. Bowel sounds are normal. He exhibits no distension and no mass. There is no tenderness. There is no rebound and no guarding.  Musculoskeletal: Normal range of motion. He exhibits no edema, tenderness or deformity.  Lymphadenopathy:    He has  no cervical adenopathy.  Neurological: He is oriented to person, place, and time.  Skin: Skin is warm and dry. No rash noted. He is not diaphoretic. No erythema. No pallor.  Vitals reviewed.   Lab Results  Component Value Date   WBC 7.8 05/10/2016   HGB 14.3 05/10/2016   HCT 42.6 05/10/2016   PLT 366.0 05/10/2016   GLUCOSE 117 (H) 11/08/2016   CHOL 115 01/12/2016   TRIG 83.0 01/12/2016   HDL 39.30 01/12/2016   LDLCALC 59 01/12/2016   ALT 22 05/10/2016   AST 21 05/10/2016   NA 141 11/08/2016   K 4.2 11/08/2016   CL 104 11/08/2016   CREATININE 1.23 11/08/2016   BUN 21 11/08/2016   CO2 29 11/08/2016   TSH 1.30 01/12/2016   PSA 0.07 (L) 01/12/2016   HGBA1C 6.3 11/08/2016   MICROALBUR 0.9 01/12/2016    Dg Chest 2 View  Result Date: 02/08/2016 CLINICAL DATA:  Cough. EXAM: CHEST  2 VIEW COMPARISON:  CT 09/06/2015.  Chest x-ray 08/26/2015 . FINDINGS: Mediastinum hilar structures normal. Lungs are clear. Cardiomegaly with normal pulmonary vascularity. No focal infiltrate. No pleural effusion or pneumothorax. Multiple right rib fractures. IMPRESSION: 1.  Cardiomegaly, no pulmonary venous congestion. 2. No focal pulmonary infiltrate. Electronically Signed   By: Marcello Moores  Register   On: 02/08/2016 08:09    Assessment & Plan:   [name] was seen today for hypertension and diabetes.  Diagnoses and all orders for this visit:  Type 2 diabetes mellitus with complication, without long-term current use of insulin ([place])- his A1c is at 6.3%. His blood sugars are adequately well-controlled on no medication. He was praised for his excellent lifestyle modifications. -     Basic metabolic panel; Future -     Hemoglobin A1c; Future -     Azilsartan-Chlorthalidone (EDARBYCLOR) 40-12.5 MG TABS; Take 1 tablet by mouth daily.  Essential hypertension, benign- his labs are negative for any secondary causes or end organ damage. I will change his antihypertensive regimen to a more potent ARB and a more  potent thiazide diuretic. -     Basic metabolic panel; Future -     Azilsartan-Chlorthalidone (EDARBYCLOR) 40-12.5 MG TABS; Take 1 tablet by mouth daily. -     Urinalysis, Routine w reflex microscopic; Future  Need for prophylactic vaccination and inoculation against influenza   I have discontinued Mr. Magid's losartan. I am also having him start on Azilsartan-Chlorthalidone. Additionally, I am having him maintain his aspirin, COD LIVER OIL PO, Lancets, Benfotiamine, glucose blood, atorvastatin, and metoprolol succinate. We will stop administering sodium chloride.  Meds ordered this encounter  Medications  . Azilsartan-Chlorthalidone (EDARBYCLOR) 40-12.5 MG TABS    Sig: Take 1 tablet by mouth daily.    Dispense:  28 tablet    Refill:  0     Follow-up: Return in about 4 weeks (around 12/06/2016).  Scarlette Calico, MD

## 2016-11-08 NOTE — Progress Notes (Signed)
Pre visit review using our clinic review tool, if applicable. No additional management support is needed unless otherwise documented below in the visit note. 

## 2016-11-09 ENCOUNTER — Encounter: Payer: Self-pay | Admitting: Internal Medicine

## 2016-11-16 DIAGNOSIS — B351 Tinea unguium: Secondary | ICD-10-CM | POA: Diagnosis not present

## 2016-11-16 DIAGNOSIS — M25571 Pain in right ankle and joints of right foot: Secondary | ICD-10-CM | POA: Diagnosis not present

## 2016-11-16 DIAGNOSIS — M79672 Pain in left foot: Secondary | ICD-10-CM | POA: Diagnosis not present

## 2016-11-16 DIAGNOSIS — M79671 Pain in right foot: Secondary | ICD-10-CM | POA: Diagnosis not present

## 2016-12-06 ENCOUNTER — Ambulatory Visit: Payer: Medicare Other | Admitting: Internal Medicine

## 2016-12-11 ENCOUNTER — Ambulatory Visit (INDEPENDENT_AMBULATORY_CARE_PROVIDER_SITE_OTHER): Payer: Medicare Other | Admitting: Internal Medicine

## 2016-12-11 ENCOUNTER — Encounter: Payer: Self-pay | Admitting: Internal Medicine

## 2016-12-11 VITALS — BP 130/80 | HR 56 | Temp 97.7°F | Resp 16 | Ht 76.0 in | Wt 265.2 lb

## 2016-12-11 DIAGNOSIS — E118 Type 2 diabetes mellitus with unspecified complications: Secondary | ICD-10-CM

## 2016-12-11 DIAGNOSIS — I10 Essential (primary) hypertension: Secondary | ICD-10-CM

## 2016-12-11 DIAGNOSIS — R0600 Dyspnea, unspecified: Secondary | ICD-10-CM

## 2016-12-11 DIAGNOSIS — R0609 Other forms of dyspnea: Secondary | ICD-10-CM

## 2016-12-11 MED ORDER — VALSARTAN 320 MG PO TABS: 320.0000 mg | ORAL_TABLET | Freq: Every day | ORAL | 1 refills | Status: DC

## 2016-12-11 NOTE — Progress Notes (Signed)
Pre visit review using our clinic review tool, if applicable. No additional management support is needed unless otherwise documented below in the visit note. 

## 2016-12-11 NOTE — Patient Instructions (Signed)
Hypertension Hypertension, commonly called high blood pressure, is when the force of blood pumping through your arteries is too strong. Your arteries are the blood vessels that carry blood from your heart throughout your body. A blood pressure reading consists of a higher number over a lower number, such as 110/72. The higher number (systolic) is the pressure inside your arteries when your heart pumps. The lower number (diastolic) is the pressure inside your arteries when your heart relaxes. Ideally you want your blood pressure below 120/80. Hypertension forces your heart to work harder to pump blood. Your arteries may become narrow or stiff. Having untreated or uncontrolled hypertension can cause heart attack, stroke, kidney disease, and other problems. What increases the risk? Some risk factors for high blood pressure are controllable. Others are not. Risk factors you cannot control include:  Race. You may be at higher risk if you are African American.  Age. Risk increases with age.  Gender. Men are at higher risk than women before age 45 years. After age 65, women are at higher risk than men. Risk factors you can control include:  Not getting enough exercise or physical activity.  Being overweight.  Getting too much fat, sugar, calories, or salt in your diet.  Drinking too much alcohol. What are the signs or symptoms? Hypertension does not usually cause signs or symptoms. Extremely high blood pressure (hypertensive crisis) may cause headache, anxiety, shortness of breath, and nosebleed. How is this diagnosed? To check if you have hypertension, your health care provider will measure your blood pressure while you are seated, with your arm held at the level of your heart. It should be measured at least twice using the same arm. Certain conditions can cause a difference in blood pressure between your right and left arms. A blood pressure reading that is higher than normal on one occasion does  not mean that you need treatment. If it is not clear whether you have high blood pressure, you may be asked to return on a different day to have your blood pressure checked again. Or, you may be asked to monitor your blood pressure at home for 1 or more weeks. How is this treated? Treating high blood pressure includes making lifestyle changes and possibly taking medicine. Living a healthy lifestyle can help lower high blood pressure. You may need to change some of your habits. Lifestyle changes may include:  Following the DASH diet. This diet is high in fruits, vegetables, and whole grains. It is low in salt, red meat, and added sugars.  Keep your sodium intake below 2,300 mg per day.  Getting at least 30-45 minutes of aerobic exercise at least 4 times per week.  Losing weight if necessary.  Not smoking.  Limiting alcoholic beverages.  Learning ways to reduce stress. Your health care provider may prescribe medicine if lifestyle changes are not enough to get your blood pressure under control, and if one of the following is true:  You are 18-59 years of age and your systolic blood pressure is above 140.  You are 60 years of age or older, and your systolic blood pressure is above 150.  Your diastolic blood pressure is above 90.  You have diabetes, and your systolic blood pressure is over 140 or your diastolic blood pressure is over 90.  You have kidney disease and your blood pressure is above 140/90.  You have heart disease and your blood pressure is above 140/90. Your personal target blood pressure may vary depending on your medical   conditions, your age, and other factors. Follow these instructions at home:  Have your blood pressure rechecked as directed by your health care provider.  Take medicines only as directed by your health care provider. Follow the directions carefully. Blood pressure medicines must be taken as prescribed. The medicine does not work as well when you skip  doses. Skipping doses also puts you at risk for problems.  Do not smoke.  Monitor your blood pressure at home as directed by your health care provider. Contact a health care provider if:  You think you are having a reaction to medicines taken.  You have recurrent headaches or feel dizzy.  You have swelling in your ankles.  You have trouble with your vision. Get help right away if:  You develop a severe headache or confusion.  You have unusual weakness, numbness, or feel faint.  You have severe chest or abdominal pain.  You vomit repeatedly.  You have trouble breathing. This information is not intended to replace advice given to you by your health care provider. Make sure you discuss any questions you have with your health care provider. Document Released: 11/06/2005 Document Revised: 04/13/2016 Document Reviewed: 08/29/2013 Elsevier Interactive Patient Education  2017 [employer]  

## 2016-12-11 NOTE — Progress Notes (Signed)
Subjective:  Patient ID: Thomas Chung, male    DOB: September 20, 1941  Age: 76 y.o. MRN: [id number]  CC: Hypertension   HPI Thomas Chung presents for follow-up on hypertension. When I last saw him his blood pressure was elevated so I asked him to start taking Edarbyclor. He tells me that he has not tolerated this medication. Since starting it he complains of weight gain, mood changes, fatigue and lethargy, DOE, muscle aches. He denies chest pain, diaphoresis, palpitations, near syncope, edema, or paresthesias.  Outpatient Medications Prior to Visit  Medication Sig Dispense Refill  . aspirin 81 MG tablet Take 81 mg by mouth daily.      Marland Kitchen atorvastatin (LIPITOR) 40 MG tablet TAKE 1 TABLET BY MOUTH DAILY 90 tablet 3  . Benfotiamine 150 MG CAPS Take by mouth 2 (two) times daily.    . COD LIVER OIL PO Take by mouth. Take 5 ml daily    . glucose blood (BAYER CONTOUR NEXT TEST) test strip Use to test blood sugar once daily I CD 10 code E11. 8 100 each 3  . Lancets MISC Test twice daily 100 each 11  . metoprolol succinate (TOPROL-XL) 50 MG 24 hr tablet TAKE 1 TABLET BY MOUTH DAILY 90 tablet 3  . Azilsartan-Chlorthalidone (EDARBYCLOR) 40-12.5 MG TABS Take 1 tablet by mouth daily. 28 tablet 0   No facility-administered medications prior to visit.     ROS Review of Systems  Constitutional: Positive for fatigue and unexpected weight change. Negative for activity change, appetite change, chills and diaphoresis.  HENT: Negative for trouble swallowing.   Eyes: Negative for visual disturbance.  Respiratory: Positive for shortness of breath (DOE). Negative for choking, chest tightness and wheezing.   Cardiovascular: Negative for chest pain, palpitations and leg swelling.  Gastrointestinal: Negative for abdominal pain, constipation, diarrhea, nausea and vomiting.  Endocrine: Negative for cold intolerance and heat intolerance.  Genitourinary: Negative.  Negative for difficulty urinating and urgency.    Musculoskeletal: Negative for arthralgias, back pain, joint swelling and myalgias.  Skin: Negative.  Negative for color change and rash.  Neurological: Negative.  Negative for dizziness, weakness, light-headedness, numbness and headaches.  Hematological: Negative.  Negative for adenopathy. Does not bruise/bleed easily.  Psychiatric/Behavioral: Negative.     Objective:  BP 130/80 (BP Location: Right Arm, Patient Position: Sitting, Cuff Size: Large)   Pulse (!) 56   Temp 97.7 F (36.5 C) (Oral)   Resp 16   Ht 6\' 4"  (1.93 m)   Wt 265 lb 4 oz (120.3 kg)   SpO2 96%   BMI 32.29 kg/m   BP Readings from Last 3 Encounters:  12/11/16 130/80  11/08/16 (!) 168/78  10/05/16 111/65    Wt Readings from Last 3 Encounters:  12/11/16 265 lb 4 oz (120.3 kg)  11/08/16 257 lb (116.6 kg)  10/05/16 258 lb (117 kg)    Physical Exam  Constitutional: He is oriented to person, place, and time. No distress.  HENT:  Mouth/Throat: Oropharynx is clear and moist. No oropharyngeal exudate.  Eyes: Conjunctivae are normal. Right eye exhibits no discharge. Left eye exhibits no discharge. No scleral icterus.  Neck: Normal range of motion. Neck supple. No JVD present. No tracheal deviation present. No thyromegaly present.  Cardiovascular: Normal rate, regular rhythm, normal heart sounds and intact distal pulses.  Exam reveals no gallop and no friction rub.   No murmur heard. EKG ---  Sinus  Bradycardia  WITHIN NORMAL LIMITS   Pulmonary/Chest: Effort normal and  breath sounds normal. No respiratory distress. He has no wheezes. He has no rales. He exhibits no tenderness.  Abdominal: Soft. Bowel sounds are normal. He exhibits no distension and no mass. There is no tenderness. There is no rebound and no guarding.  Musculoskeletal: Normal range of motion. He exhibits edema (trace pitting edema in BLE). He exhibits no tenderness or deformity.  Lymphadenopathy:    He has no cervical adenopathy.  Neurological:  He is oriented to person, place, and time.  Skin: Skin is warm and dry. No rash noted. He is not diaphoretic. No erythema. No pallor.  Vitals reviewed.   Lab Results  Component Value Date   WBC 7.8 05/10/2016   HGB 14.3 05/10/2016   HCT 42.6 05/10/2016   PLT 366.0 05/10/2016   GLUCOSE 117 (H) 11/08/2016   CHOL 115 01/12/2016   TRIG 83.0 01/12/2016   HDL 39.30 01/12/2016   LDLCALC 59 01/12/2016   ALT 22 05/10/2016   AST 21 05/10/2016   NA 141 11/08/2016   K 4.2 11/08/2016   CL 104 11/08/2016   CREATININE 1.23 11/08/2016   BUN 21 11/08/2016   CO2 29 11/08/2016   TSH 1.30 01/12/2016   PSA 0.07 (L) 01/12/2016   HGBA1C 6.3 11/08/2016   MICROALBUR 0.9 01/12/2016    Dg Chest 2 View  Result Date: 02/08/2016 CLINICAL DATA:  Cough. EXAM: CHEST  2 VIEW COMPARISON:  CT 09/06/2015.  Chest x-ray 08/26/2015 . FINDINGS: Mediastinum hilar structures normal. Lungs are clear. Cardiomegaly with normal pulmonary vascularity. No focal infiltrate. No pleural effusion or pneumothorax. Multiple right rib fractures. IMPRESSION: 1.  Cardiomegaly, no pulmonary venous congestion. 2. No focal pulmonary infiltrate. Electronically Signed   By: Marcello Moores  Register   On: 02/08/2016 08:09    Assessment & Plan:   Thomas Chung was seen today for hypertension.  Diagnoses and all orders for this visit:  DOE (dyspnea on exertion)- His EKG is normal today, he had a normal ETT about a year and a half ago so I don't think his DOE is related to coronary artery disease. I am concerned that he is experiencing symptoms related to the thiazide diuretic. Will discontinue chlorthalidone and will change to an ARB. -     EKG 12-Lead  Type 2 diabetes mellitus with complication, without long-term current use of insulin ([facility]) -     valsartan (DIOVAN) 320 MG tablet; Take 1 tablet (320 mg total) by mouth daily.  Essential hypertension, benign- it sounds like he is not tolerating the thiazide diuretic so I have asked him to discontinue  chlorthalidone and will try to control his blood pressure with an ARB and beta blocker. -     valsartan (DIOVAN) 320 MG tablet; Take 1 tablet (320 mg total) by mouth daily.   I have discontinued [name]'s Azilsartan-Chlorthalidone. I am also having him start on valsartan. Additionally, I am having him maintain his aspirin, COD LIVER OIL PO, Lancets, Benfotiamine, glucose blood, atorvastatin, metoprolol succinate, ketoconazole, and terbinafine.  Meds ordered this encounter  Medications  . ketoconazole (NIZORAL) 2 % cream    Sig: APP EXT TO FEET QD    Refill:  2  . terbinafine (LAMISIL) 250 MG tablet    Sig: TK 1 T PO D    Refill:  0  . valsartan (DIOVAN) 320 MG tablet    Sig: Take 1 tablet (320 mg total) by mouth daily.    Dispense:  90 tablet    Refill:  1  Follow-up: Return in about 3 months (around 03/11/2017).  Scarlette Calico, MD

## 2016-12-14 DIAGNOSIS — M79672 Pain in left foot: Secondary | ICD-10-CM | POA: Diagnosis not present

## 2016-12-14 DIAGNOSIS — B351 Tinea unguium: Secondary | ICD-10-CM | POA: Diagnosis not present

## 2016-12-14 DIAGNOSIS — M79671 Pain in right foot: Secondary | ICD-10-CM | POA: Diagnosis not present

## 2017-01-04 DIAGNOSIS — L603 Nail dystrophy: Secondary | ICD-10-CM | POA: Diagnosis not present

## 2017-01-04 DIAGNOSIS — I739 Peripheral vascular disease, unspecified: Secondary | ICD-10-CM | POA: Diagnosis not present

## 2017-01-04 DIAGNOSIS — E1151 Type 2 diabetes mellitus with diabetic peripheral angiopathy without gangrene: Secondary | ICD-10-CM | POA: Diagnosis not present

## 2017-01-08 DIAGNOSIS — M79672 Pain in left foot: Secondary | ICD-10-CM | POA: Diagnosis not present

## 2017-01-08 DIAGNOSIS — B351 Tinea unguium: Secondary | ICD-10-CM | POA: Diagnosis not present

## 2017-01-08 DIAGNOSIS — M79671 Pain in right foot: Secondary | ICD-10-CM | POA: Diagnosis not present

## 2017-02-05 DIAGNOSIS — B351 Tinea unguium: Secondary | ICD-10-CM | POA: Diagnosis not present

## 2017-02-05 DIAGNOSIS — M79671 Pain in right foot: Secondary | ICD-10-CM | POA: Diagnosis not present

## 2017-02-05 DIAGNOSIS — M79672 Pain in left foot: Secondary | ICD-10-CM | POA: Diagnosis not present

## 2017-02-26 ENCOUNTER — Ambulatory Visit (INDEPENDENT_AMBULATORY_CARE_PROVIDER_SITE_OTHER): Payer: Medicare Other | Admitting: Internal Medicine

## 2017-02-26 ENCOUNTER — Other Ambulatory Visit (INDEPENDENT_AMBULATORY_CARE_PROVIDER_SITE_OTHER): Payer: Medicare Other

## 2017-02-26 ENCOUNTER — Encounter: Payer: Self-pay | Admitting: Internal Medicine

## 2017-02-26 VITALS — BP 140/70 | HR 59 | Temp 97.8°F | Ht 76.0 in | Wt 256.8 lb

## 2017-02-26 DIAGNOSIS — E785 Hyperlipidemia, unspecified: Secondary | ICD-10-CM

## 2017-02-26 DIAGNOSIS — A09 Infectious gastroenteritis and colitis, unspecified: Secondary | ICD-10-CM

## 2017-02-26 DIAGNOSIS — I10 Essential (primary) hypertension: Secondary | ICD-10-CM

## 2017-02-26 DIAGNOSIS — E118 Type 2 diabetes mellitus with unspecified complications: Secondary | ICD-10-CM

## 2017-02-26 DIAGNOSIS — E1169 Type 2 diabetes mellitus with other specified complication: Secondary | ICD-10-CM | POA: Diagnosis not present

## 2017-02-26 DIAGNOSIS — N521 Erectile dysfunction due to diseases classified elsewhere: Secondary | ICD-10-CM | POA: Diagnosis not present

## 2017-02-26 LAB — CBC WITH DIFFERENTIAL/PLATELET
Basophils Absolute: 0 10*3/uL (ref 0.0–0.1)
Basophils Relative: 0.5 % (ref 0.0–3.0)
Eosinophils Absolute: 0.3 10*3/uL (ref 0.0–0.7)
Eosinophils Relative: 2.8 % (ref 0.0–5.0)
HCT: 46.6 % (ref 39.0–52.0)
Hemoglobin: 15.8 g/dL (ref 13.0–17.0)
Lymphocytes Relative: 10.6 % — ABNORMAL LOW (ref 12.0–46.0)
Lymphs Abs: 1 10*3/uL (ref 0.7–4.0)
MCHC: 33.9 g/dL (ref 30.0–36.0)
MCV: 85.1 fl (ref 78.0–100.0)
Monocytes Absolute: 0.8 10*3/uL (ref 0.1–1.0)
Monocytes Relative: 9.2 % (ref 3.0–12.0)
Neutro Abs: 6.9 10*3/uL (ref 1.4–7.7)
Neutrophils Relative %: 76.9 % (ref 43.0–77.0)
Platelets: 372 10*3/uL (ref 150.0–400.0)
RBC: 5.48 Mil/uL (ref 4.22–5.81)
RDW: 16.8 % — ABNORMAL HIGH (ref 11.5–15.5)
WBC: 9 10*3/uL (ref 4.0–10.5)

## 2017-02-26 LAB — URINALYSIS, ROUTINE W REFLEX MICROSCOPIC
Bilirubin Urine: NEGATIVE
Hgb urine dipstick: NEGATIVE
Ketones, ur: NEGATIVE
Leukocytes, UA: NEGATIVE
Nitrite: NEGATIVE
RBC / HPF: NONE SEEN (ref 0–?)
Specific Gravity, Urine: 1.015 (ref 1.000–1.030)
Total Protein, Urine: NEGATIVE
Urine Glucose: NEGATIVE
Urobilinogen, UA: 0.2 (ref 0.0–1.0)
WBC, UA: NONE SEEN (ref 0–?)
pH: 6 (ref 5.0–8.0)

## 2017-02-26 LAB — COMPREHENSIVE METABOLIC PANEL
ALT: 17 U/L (ref 0–53)
AST: 17 U/L (ref 0–37)
Albumin: 4.6 g/dL (ref 3.5–5.2)
Alkaline Phosphatase: 51 U/L (ref 39–117)
BUN: 19 mg/dL (ref 6–23)
CO2: 28 mEq/L (ref 19–32)
Calcium: 9.6 mg/dL (ref 8.4–10.5)
Chloride: 107 mEq/L (ref 96–112)
Creatinine, Ser: 1.1 mg/dL (ref 0.40–1.50)
GFR: 69.32 mL/min (ref >60.00)
Glucose, Bld: 104 mg/dL — ABNORMAL HIGH (ref 70–99)
Potassium: 4.1 mEq/L (ref 3.5–5.1)
Sodium: 141 mEq/L (ref 135–145)
Total Bilirubin: 0.8 mg/dL (ref 0.2–1.2)
Total Protein: 7 g/dL (ref 6.0–8.3)

## 2017-02-26 LAB — TSH: TSH: 1.37 u[IU]/mL (ref 0.35–4.50)

## 2017-02-26 LAB — LIPID PANEL
Cholesterol: 136 mg/dL (ref 0–200)
HDL: 37.1 mg/dL — ABNORMAL LOW (ref >39.00)
LDL Cholesterol: 81 mg/dL (ref 0–99)
NonHDL: 98.85
Total CHOL/HDL Ratio: 4
Triglycerides: 87 mg/dL (ref 0.0–149.0)
VLDL: 17.4 mg/dL (ref 0.0–40.0)

## 2017-02-26 LAB — MICROALBUMIN / CREATININE URINE RATIO
Creatinine,U: 95.4 mg/dL
Microalb Creat Ratio: 1 mg/g (ref 0.0–30.0)
Microalb, Ur: 1 mg/dL (ref 0.0–1.9)

## 2017-02-26 LAB — HEMOGLOBIN A1C: Hgb A1c MFr Bld: 6.4 % (ref 4.6–6.5)

## 2017-02-26 MED ORDER — SILDENAFIL CITRATE 20 MG PO TABS: 80.0000 mg | ORAL_TABLET | Freq: Every day | ORAL | 11 refills | Status: DC | PRN

## 2017-02-26 NOTE — Progress Notes (Signed)
Subjective:  Patient ID: Thomas Chung, male    DOB: [birth year]  Age: 76 y.o. MRN: [id number]  CC: Diarrhea   HPI Thomas Chung presents for a 2 week hx of diarrhea - 3 brown, watery stools per day. He has not taken anything to control the diarrhea. He denies N/V/abd pain/F/C/LOA/wt. Loss/known exposure to a GI pathogen.  Outpatient Medications Prior to Visit  Medication Sig Dispense Refill  . aspirin 81 MG tablet Take 81 mg by mouth daily.      Marland Kitchen atorvastatin (LIPITOR) 40 MG tablet TAKE 1 TABLET BY MOUTH DAILY 90 tablet 3  . Benfotiamine 150 MG CAPS Take by mouth 2 (two) times daily.    . COD LIVER OIL PO Take by mouth. Take 5 ml daily    . glucose blood (BAYER CONTOUR NEXT TEST) test strip Use to test blood sugar once daily I CD 10 code E11. 8 100 each 3  . ketoconazole (NIZORAL) 2 % cream APP EXT TO FEET QD  2  . Lancets MISC Test twice daily 100 each 11  . metoprolol succinate (TOPROL-XL) 50 MG 24 hr tablet TAKE 1 TABLET BY MOUTH DAILY 90 tablet 3  . valsartan (DIOVAN) 320 MG tablet Take 1 tablet (320 mg total) by mouth daily. 90 tablet 1  . terbinafine (LAMISIL) 250 MG tablet TK 1 T PO D  0   No facility-administered medications prior to visit.     ROS Review of Systems  Constitutional: Negative for activity change, chills, diaphoresis, fatigue and fever.  HENT: Negative.  Negative for trouble swallowing.   Eyes: Negative for visual disturbance.  Respiratory: Negative for cough, chest tightness, shortness of breath and wheezing.   Cardiovascular: Negative for chest pain, palpitations and leg swelling.  Gastrointestinal: Positive for diarrhea. Negative for abdominal pain, blood in stool, constipation, nausea and vomiting.  Endocrine: Negative.  Negative for polydipsia, polyphagia and polyuria.  Genitourinary: Negative.  Negative for difficulty urinating and dysuria.       + ED  Musculoskeletal: Negative.  Negative for back pain and myalgias.  Skin: Negative.   Negative for color change and rash.  Allergic/Immunologic: Negative.   Neurological: Negative.  Negative for dizziness, weakness, light-headedness and numbness.  Hematological: Negative for adenopathy. Does not bruise/bleed easily.  Psychiatric/Behavioral: Negative.     Objective:  BP 140/70 (BP Location: Left Arm, Patient Position: Sitting, Cuff Size: Normal)   Pulse (!) 59   Temp 97.8 F (36.6 C) (Oral)   Ht 6\' 4"  (1.93 m)   Wt 256 lb 12 oz (116.5 kg)   SpO2 98%   BMI 31.25 kg/m   BP Readings from Last 3 Encounters:  02/26/17 140/70  12/11/16 130/80  11/08/16 (!) 168/78    Wt Readings from Last 3 Encounters:  02/26/17 256 lb 12 oz (116.5 kg)  12/11/16 265 lb 4 oz (120.3 kg)  11/08/16 257 lb (116.6 kg)    Physical Exam  Constitutional: He is oriented to person, place, and time.  Non-toxic appearance. He does not have a sickly appearance. He does not appear ill. No distress.  HENT:  Mouth/Throat: Oropharynx is clear and moist. No oropharyngeal exudate.  Eyes: Conjunctivae are normal. Right eye exhibits no discharge. Left eye exhibits no discharge. No scleral icterus.  Neck: Normal range of motion. Neck supple. No JVD present. No tracheal deviation present. No thyromegaly present.  Cardiovascular: Normal rate, regular rhythm, normal heart sounds and intact distal pulses.  Exam reveals no gallop and no  friction rub.   No murmur heard. Pulmonary/Chest: Effort normal and breath sounds normal. No respiratory distress. He has no wheezes. He has no rales. He exhibits no tenderness.  Abdominal: Normal appearance. He exhibits no distension, no ascites and no mass. Bowel sounds are increased. There is no hepatosplenomegaly, splenomegaly or hepatomegaly. There is no tenderness. There is no rebound, no guarding and no CVA tenderness.  Musculoskeletal: Normal range of motion. He exhibits no edema, tenderness or deformity.  Lymphadenopathy:    He has no cervical adenopathy.    Neurological: He is oriented to person, place, and time.  Skin: Skin is warm and dry. No rash noted. He is not diaphoretic. No erythema. No pallor.  Vitals reviewed.   Lab Results  Component Value Date   WBC 7.8 05/10/2016   HGB 14.3 05/10/2016   HCT 42.6 05/10/2016   PLT 366.0 05/10/2016   GLUCOSE 117 (H) 11/08/2016   CHOL 115 01/12/2016   TRIG 83.0 01/12/2016   HDL 39.30 01/12/2016   LDLCALC 59 01/12/2016   ALT 22 05/10/2016   AST 21 05/10/2016   NA 141 11/08/2016   K 4.2 11/08/2016   CL 104 11/08/2016   CREATININE 1.23 11/08/2016   BUN 21 11/08/2016   CO2 29 11/08/2016   TSH 1.30 01/12/2016   PSA 0.07 (L) 01/12/2016   HGBA1C 6.3 11/08/2016   MICROALBUR 0.9 01/12/2016    Dg Chest 2 View  Result Date: 02/08/2016 CLINICAL DATA:  Cough. EXAM: CHEST  2 VIEW COMPARISON:  CT 09/06/2015.  Chest x-ray 08/26/2015 . FINDINGS: Mediastinum hilar structures normal. Lungs are clear. Cardiomegaly with normal pulmonary vascularity. No focal infiltrate. No pleural effusion or pneumothorax. Multiple right rib fractures. IMPRESSION: 1.  Cardiomegaly, no pulmonary venous congestion. 2. No focal pulmonary infiltrate. Electronically Signed   By: Marcello Moores  Register   On: 02/08/2016 08:09    Assessment & Plan:   [name] was seen today for diarrhea.  Diagnoses and all orders for this visit:  Essential hypertension, benign- his blood pressure is well-controlled, lites and renal function are normal -     Comprehensive metabolic panel; Future -     Urinalysis, Routine w reflex microscopic; Future  Type 2 diabetes mellitus with complication, without long-term current use of insulin ([place])- his A1c is 6.3%, his blood sugars are well-controlled. -     Comprehensive metabolic panel; Future -     Hemoglobin A1c; Future -     Microalbumin / creatinine urine ratio; Future  Hyperlipidemia with target LDL less than 100- he has achieved his LDL goal is doing well on the statin. -     Lipid panel;  Future -     TSH; Future  Diarrhea of infectious origin- His white cell count is normal at 7.8 so I don't think he has a serious GI infection like C. difficile, Salmonella, or Shigella. I have ordered a GI PCR panel to try to identify what's causing the diarrhea. For now, will withhold antibiotic therapy and I have asked him to start Imodium A-D as needed for the diarrhea. -     CBC with Differential/Platelet; Future -     Gastrointestinal Pathogen Panel PCR; Future -     Fecal lactoferrin, quant; Future  Erectile dysfunction associated with type 2 diabetes mellitus (HCC) -     sildenafil (REVATIO) 20 MG tablet; Take 4 tablets (80 mg total) by mouth daily as needed.   I have discontinued Thomas Chung's terbinafine. I am also having him start on  sildenafil. Additionally, I am having him maintain his aspirin, COD LIVER OIL PO, Lancets, Benfotiamine, glucose blood, atorvastatin, metoprolol succinate, ketoconazole, and valsartan.  Meds ordered this encounter  Medications  . sildenafil (REVATIO) 20 MG tablet    Sig: Take 4 tablets (80 mg total) by mouth daily as needed.    Dispense:  60 tablet    Refill:  11     Follow-up: Return in about 3 weeks (around 03/19/2017).  Scarlette Calico, MD

## 2017-02-26 NOTE — Patient Instructions (Signed)

## 2017-02-26 NOTE — Progress Notes (Signed)
Pre visit review using our clinic review tool, if applicable. No additional management support is needed unless otherwise documented below in the visit note. 

## 2017-02-27 ENCOUNTER — Encounter: Payer: Self-pay | Admitting: Internal Medicine

## 2017-02-27 ENCOUNTER — Other Ambulatory Visit: Payer: Medicare Other

## 2017-02-27 DIAGNOSIS — A09 Infectious gastroenteritis and colitis, unspecified: Secondary | ICD-10-CM | POA: Diagnosis not present

## 2017-02-28 LAB — FECAL LACTOFERRIN, QUANT: Lactoferrin: POSITIVE

## 2017-03-01 LAB — GASTROINTESTINAL PATHOGEN PANEL PCR
C. difficile Tox A/B, PCR: NOT DETECTED
Campylobacter, PCR: NOT DETECTED
Cryptosporidium, PCR: NOT DETECTED
E coli (ETEC) LT/ST PCR: NOT DETECTED
E coli (STEC) stx1/stx2, PCR: NOT DETECTED
E coli 0157, PCR: NOT DETECTED
Giardia lamblia, PCR: NOT DETECTED
Norovirus, PCR: NOT DETECTED
Rotavirus A, PCR: NOT DETECTED
Salmonella, PCR: NOT DETECTED
Shigella, PCR: NOT DETECTED

## 2017-03-02 ENCOUNTER — Encounter: Payer: Self-pay | Admitting: Internal Medicine

## 2017-03-05 ENCOUNTER — Encounter: Payer: Self-pay | Admitting: Internal Medicine

## 2017-03-05 ENCOUNTER — Ambulatory Visit (INDEPENDENT_AMBULATORY_CARE_PROVIDER_SITE_OTHER): Payer: Medicare Other | Admitting: Internal Medicine

## 2017-03-05 VITALS — BP 122/72 | HR 62 | Temp 98.2°F | Resp 16 | Ht 76.0 in | Wt 255.0 lb

## 2017-03-05 DIAGNOSIS — J988 Other specified respiratory disorders: Secondary | ICD-10-CM

## 2017-03-05 MED ORDER — CEFDINIR 300 MG PO CAPS: 300.0000 mg | ORAL_CAPSULE | Freq: Two times a day (BID) | ORAL | 0 refills | Status: AC

## 2017-03-05 NOTE — Progress Notes (Signed)
Subjective:  Patient ID: Thomas Chung, male    DOB: 1940-12-25  Age: 76 y.o. MRN: [id number]  CC: URI   HPI Thomas Chung presents for A 2 day history of sore throat, nasal congestion, fatigue, cough productive of thick yellow phlegm with chills and night sweats and fever to 102 degrees. He denies lymphadenopathy, earaches, rash, abdominal pain, nausea, vomiting, or diarrhea. He has a prescription cough syrup at home that he's been taking to control his symptoms.  Outpatient Medications Prior to Visit  Medication Sig Dispense Refill  . aspirin 81 MG tablet Take 81 mg by mouth daily.      Marland Kitchen atorvastatin (LIPITOR) 40 MG tablet TAKE 1 TABLET BY MOUTH DAILY 90 tablet 3  . Benfotiamine 150 MG CAPS Take by mouth 2 (two) times daily.    . COD LIVER OIL PO Take by mouth. Take 5 ml daily    . glucose blood (BAYER CONTOUR NEXT TEST) test strip Use to test blood sugar once daily I CD 10 code E11. 8 100 each 3  . ketoconazole (NIZORAL) 2 % cream APP EXT TO FEET QD  2  . Lancets MISC Test twice daily 100 each 11  . metoprolol succinate (TOPROL-XL) 50 MG 24 hr tablet TAKE 1 TABLET BY MOUTH DAILY 90 tablet 3  . sildenafil (REVATIO) 20 MG tablet Take 4 tablets (80 mg total) by mouth daily as needed. 60 tablet 11  . valsartan (DIOVAN) 320 MG tablet Take 1 tablet (320 mg total) by mouth daily. 90 tablet 1   No facility-administered medications prior to visit.     ROS Review of Systems  Constitutional: Positive for chills, fatigue and fever. Negative for activity change, appetite change and diaphoresis.  HENT: Positive for congestion, rhinorrhea and sore throat. Negative for facial swelling, sinus pain, sinus pressure, trouble swallowing and voice change.   Eyes: Negative.   Respiratory: Positive for cough. Negative for chest tightness and shortness of breath.   Cardiovascular: Negative.  Negative for chest pain, palpitations and leg swelling.  Gastrointestinal: Negative for abdominal pain,  constipation, diarrhea, nausea and vomiting.  Endocrine: Negative.   Genitourinary: Negative.  Negative for difficulty urinating.  Musculoskeletal: Negative.  Negative for back pain and myalgias.  Skin: Negative.  Negative for color change, pallor and rash.  Allergic/Immunologic: Negative.   Neurological: Negative.  Negative for dizziness, weakness and numbness.  Hematological: Negative for adenopathy. Does not bruise/bleed easily.  Psychiatric/Behavioral: Negative.     Objective:  BP 122/72 (BP Location: Left Arm, Patient Position: Sitting, Cuff Size: Large)   Pulse 62   Temp 98.2 F (36.8 C) (Oral)   Resp 16   Ht 6\' 4"  (1.93 m)   Wt 255 lb (115.7 kg)   SpO2 98%   BMI 31.04 kg/m   BP Readings from Last 3 Encounters:  03/05/17 122/72  02/26/17 140/70  12/11/16 130/80    Wt Readings from Last 3 Encounters:  03/05/17 255 lb (115.7 kg)  02/26/17 256 lb 12 oz (116.5 kg)  12/11/16 265 lb 4 oz (120.3 kg)    Physical Exam  Constitutional: He is oriented to person, place, and time.  Non-toxic appearance. He does not have a sickly appearance. He does not appear ill. No distress.  HENT:  Right Ear: Hearing, tympanic membrane, external ear and ear canal normal.  Left Ear: Hearing, tympanic membrane, external ear and ear canal normal.  Nose: Rhinorrhea present. Right sinus exhibits no maxillary sinus tenderness and no frontal sinus  tenderness. Left sinus exhibits no maxillary sinus tenderness and no frontal sinus tenderness.  Mouth/Throat: No oral lesions. No trismus in the jaw. No uvula swelling. Posterior oropharyngeal erythema present. No oropharyngeal exudate, posterior oropharyngeal edema or tonsillar abscesses.  Eyes: Conjunctivae are normal. Right eye exhibits no discharge. Left eye exhibits no discharge. No scleral icterus.  Neck: Normal range of motion. Neck supple. No JVD present. No tracheal deviation present. No thyromegaly present.  Cardiovascular: Normal rate, regular  rhythm, normal heart sounds and intact distal pulses.  Exam reveals no gallop and no friction rub.   No murmur heard. Pulmonary/Chest: Effort normal and breath sounds normal. No stridor. No respiratory distress. He has no wheezes. He has no rales. He exhibits no tenderness.  Abdominal: Soft. Bowel sounds are normal. He exhibits no distension and no mass. There is no tenderness. There is no rebound and no guarding.  Musculoskeletal: Normal range of motion. He exhibits no edema, tenderness or deformity.  Lymphadenopathy:    He has no cervical adenopathy.  Neurological: He is oriented to person, place, and time.  Skin: Skin is warm and dry. No rash noted. He is not diaphoretic. No erythema. No pallor.  Vitals reviewed.   Lab Results  Component Value Date   WBC 9.0 02/26/2017   HGB 15.8 02/26/2017   HCT 46.6 02/26/2017   PLT 372.0 02/26/2017   GLUCOSE 104 (H) 02/26/2017   CHOL 136 02/26/2017   TRIG 87.0 02/26/2017   HDL 37.10 (L) 02/26/2017   LDLCALC 81 02/26/2017   ALT 17 02/26/2017   AST 17 02/26/2017   NA 141 02/26/2017   K 4.1 02/26/2017   CL 107 02/26/2017   CREATININE 1.10 02/26/2017   BUN 19 02/26/2017   CO2 28 02/26/2017   TSH 1.37 02/26/2017   PSA 0.07 (L) 01/12/2016   HGBA1C 6.4 02/26/2017   MICROALBUR 1.0 02/26/2017    Dg Chest 2 View  Result Date: 02/08/2016 CLINICAL DATA:  Cough. EXAM: CHEST  2 VIEW COMPARISON:  CT 09/06/2015.  Chest x-ray 08/26/2015 . FINDINGS: Mediastinum hilar structures normal. Lungs are clear. Cardiomegaly with normal pulmonary vascularity. No focal infiltrate. No pleural effusion or pneumothorax. Multiple right rib fractures. IMPRESSION: 1.  Cardiomegaly, no pulmonary venous congestion. 2. No focal pulmonary infiltrate. Electronically Signed   By: Thomas Chung  Register   On: 02/08/2016 08:09    Assessment & Plan:   Thomas Chung was seen today for uri.  Diagnoses and all orders for this visit:  RTI (respiratory tract infection)- I will treat the  infection with Omnicef, I encouraged him to use the cough suppressant he has at home to control his symptoms. -     cefdinir (OMNICEF) 300 MG capsule; Take 1 capsule (300 mg total) by mouth 2 (two) times daily.   I am having Mr. Zachow start on cefdinir. I am also having him maintain his aspirin, COD LIVER OIL PO, Lancets, Benfotiamine, glucose blood, atorvastatin, metoprolol succinate, ketoconazole, valsartan, and sildenafil.  Meds ordered this encounter  Medications  . cefdinir (OMNICEF) 300 MG capsule    Sig: Take 1 capsule (300 mg total) by mouth 2 (two) times daily.    Dispense:  20 capsule    Refill:  0     Follow-up: Return in about 3 weeks (around 03/26/2017).  Scarlette Calico, MD

## 2017-03-05 NOTE — Progress Notes (Signed)
Pre visit review using our clinic review tool, if applicable. No additional management support is needed unless otherwise documented below in the visit note. 

## 2017-03-05 NOTE — Patient Instructions (Signed)
Cough, Adult Coughing is a reflex that clears your throat and your airways. Coughing helps to heal and protect your lungs. It is normal to cough occasionally, but a cough that happens with other symptoms or lasts a long time may be a sign of a condition that needs treatment. A cough may last only 2-3 weeks (acute), or it may last longer than 8 weeks (chronic). What are the causes? Coughing is commonly caused by:  Breathing in substances that irritate your lungs.  A viral or bacterial respiratory infection.  Allergies.  Asthma.  Postnasal drip.  Smoking.  Acid backing up from the stomach into the esophagus (gastroesophageal reflux).  Certain medicines.  Chronic lung problems, including COPD (or rarely, lung cancer).  Other medical conditions such as heart failure.  Follow these instructions at home: Pay attention to any changes in your symptoms. Take these actions to help with your discomfort:  Take medicines only as told by your health care provider. ? If you were prescribed an antibiotic medicine, take it as told by your health care provider. Do not stop taking the antibiotic even if you start to feel better. ? Talk with your health care provider before you take a cough suppressant medicine.  Drink enough fluid to keep your urine clear or pale yellow.  If the air is dry, use a cold steam vaporizer or humidifier in your bedroom or your home to help loosen secretions.  Avoid anything that causes you to cough at work or at home.  If your cough is worse at night, try sleeping in a semi-upright position.  Avoid cigarette smoke. If you smoke, quit smoking. If you need help quitting, ask your health care provider.  Avoid caffeine.  Avoid alcohol.  Rest as needed.  Contact a health care provider if:  You have new symptoms.  You cough up pus.  Your cough does not get better after 2-3 weeks, or your cough gets worse.  You cannot control your cough with suppressant  medicines and you are losing sleep.  You develop pain that is getting worse or pain that is not controlled with pain medicines.  You have a fever.  You have unexplained weight loss.  You have night sweats. Get help right away if:  You cough up blood.  You have difficulty breathing.  Your heartbeat is very fast. This information is not intended to replace advice given to you by your health care provider. Make sure you discuss any questions you have with your health care provider. Document Released: 05/05/2011 Document Revised: 04/13/2016 Document Reviewed: 01/13/2015 Elsevier Interactive Patient Education  2017 [employer] Inc.  

## 2017-03-12 ENCOUNTER — Ambulatory Visit: Payer: Medicare Other | Admitting: Internal Medicine

## 2017-03-15 ENCOUNTER — Ambulatory Visit: Payer: Medicare Other | Admitting: Internal Medicine

## 2017-03-16 DIAGNOSIS — L821 Other seborrheic keratosis: Secondary | ICD-10-CM | POA: Diagnosis not present

## 2017-03-16 DIAGNOSIS — B351 Tinea unguium: Secondary | ICD-10-CM | POA: Diagnosis not present

## 2017-03-16 DIAGNOSIS — L570 Actinic keratosis: Secondary | ICD-10-CM | POA: Diagnosis not present

## 2017-03-16 DIAGNOSIS — D692 Other nonthrombocytopenic purpura: Secondary | ICD-10-CM | POA: Diagnosis not present

## 2017-03-16 DIAGNOSIS — Z85828 Personal history of other malignant neoplasm of skin: Secondary | ICD-10-CM | POA: Diagnosis not present

## 2017-03-16 DIAGNOSIS — L84 Corns and callosities: Secondary | ICD-10-CM | POA: Diagnosis not present

## 2017-03-20 ENCOUNTER — Ambulatory Visit (INDEPENDENT_AMBULATORY_CARE_PROVIDER_SITE_OTHER): Payer: Medicare Other | Admitting: Internal Medicine

## 2017-03-20 ENCOUNTER — Encounter: Payer: Self-pay | Admitting: Internal Medicine

## 2017-03-20 VITALS — BP 148/82 | HR 55 | Temp 98.1°F | Resp 16 | Ht 76.0 in | Wt 260.5 lb

## 2017-03-20 DIAGNOSIS — I10 Essential (primary) hypertension: Secondary | ICD-10-CM

## 2017-03-20 DIAGNOSIS — J988 Other specified respiratory disorders: Secondary | ICD-10-CM

## 2017-03-20 NOTE — Progress Notes (Signed)
Subjective:  Patient ID: Thomas Chung, male    DOB: 1941/03/26  Age: 76 y.o. MRN: [id number]  CC: URI   HPI Thomas Chung presents for f/up after a recent URI - his sx's have resolved. He feels well today and offers no complaints.  Outpatient Medications Prior to Visit  Medication Sig Dispense Refill  . aspirin 81 MG tablet Take 81 mg by mouth daily.      Marland Kitchen atorvastatin (LIPITOR) 40 MG tablet TAKE 1 TABLET BY MOUTH DAILY 90 tablet 3  . Benfotiamine 150 MG CAPS Take by mouth 2 (two) times daily.    . COD LIVER OIL PO Take by mouth. Take 5 ml daily    . glucose blood (BAYER CONTOUR NEXT TEST) test strip Use to test blood sugar once daily I CD 10 code E11. 8 100 each 3  . Lancets MISC Test twice daily 100 each 11  . metoprolol succinate (TOPROL-XL) 50 MG 24 hr tablet TAKE 1 TABLET BY MOUTH DAILY 90 tablet 3  . sildenafil (REVATIO) 20 MG tablet Take 4 tablets (80 mg total) by mouth daily as needed. 60 tablet 11  . valsartan (DIOVAN) 320 MG tablet Take 1 tablet (320 mg total) by mouth daily. 90 tablet 1  . ketoconazole (NIZORAL) 2 % cream APP EXT TO FEET QD  2   No facility-administered medications prior to visit.     ROS Review of Systems  Constitutional: Negative for chills, diaphoresis, fatigue, fever and unexpected weight change.  HENT: Negative.  Negative for sinus pain, sore throat and trouble swallowing.   Eyes: Negative.   Respiratory: Negative.  Negative for cough, shortness of breath and wheezing.   Cardiovascular: Negative for chest pain, palpitations and leg swelling.  Gastrointestinal: Negative for abdominal pain, constipation, diarrhea, nausea and vomiting.  Endocrine: Negative.   Genitourinary: Negative.  Negative for difficulty urinating.  Musculoskeletal: Negative.  Negative for back pain and myalgias.  Skin: Negative.  Negative for color change and rash.  Allergic/Immunologic: Negative.   Neurological: Negative.  Negative for dizziness, weakness and  numbness.  Hematological: Negative for adenopathy. Does not bruise/bleed easily.  Psychiatric/Behavioral: Negative.     Objective:  BP (!) 148/82 (BP Location: Left Arm, Patient Position: Sitting, Cuff Size: Normal)   Pulse (!) 55   Temp 98.1 F (36.7 C) (Oral)   Resp 16   Ht 6\' 4"  (1.93 m)   Wt 260 lb 8 oz (118.2 kg)   SpO2 97%   BMI 31.71 kg/m   BP Readings from Last 3 Encounters:  03/20/17 (!) 148/82  03/05/17 122/72  02/26/17 140/70    Wt Readings from Last 3 Encounters:  03/20/17 260 lb 8 oz (118.2 kg)  03/05/17 255 lb (115.7 kg)  02/26/17 256 lb 12 oz (116.5 kg)    Physical Exam  Constitutional: He is oriented to person, place, and time. No distress.  HENT:  Mouth/Throat: Oropharynx is clear and moist. No oropharyngeal exudate.  Eyes: Conjunctivae are normal. Right eye exhibits no discharge. Left eye exhibits no discharge. No scleral icterus.  Neck: Normal range of motion. Neck supple. No JVD present. No tracheal deviation present. No thyromegaly present.  Cardiovascular: Normal rate, regular rhythm, normal heart sounds and intact distal pulses.  Exam reveals no gallop and no friction rub.   No murmur heard. Pulmonary/Chest: Effort normal and breath sounds normal. No stridor. No respiratory distress. He has no wheezes. He has no rales. He exhibits no tenderness.  Abdominal: Soft. Bowel  sounds are normal. He exhibits no distension and no mass. There is no tenderness. There is no rebound and no guarding.  Musculoskeletal: Normal range of motion. He exhibits no edema, tenderness or deformity.  Lymphadenopathy:    He has no cervical adenopathy.  Neurological: He is oriented to person, place, and time.  Skin: Skin is warm and dry. No rash noted. He is not diaphoretic. No erythema. No pallor.  Vitals reviewed.   Lab Results  Component Value Date   WBC 9.0 02/26/2017   HGB 15.8 02/26/2017   HCT 46.6 02/26/2017   PLT 372.0 02/26/2017   GLUCOSE 104 (H) 02/26/2017    CHOL 136 02/26/2017   TRIG 87.0 02/26/2017   HDL 37.10 (L) 02/26/2017   LDLCALC 81 02/26/2017   ALT 17 02/26/2017   AST 17 02/26/2017   NA 141 02/26/2017   K 4.1 02/26/2017   CL 107 02/26/2017   CREATININE 1.10 02/26/2017   BUN 19 02/26/2017   CO2 28 02/26/2017   TSH 1.37 02/26/2017   PSA 0.07 (L) 01/12/2016   HGBA1C 6.4 02/26/2017   MICROALBUR 1.0 02/26/2017    Dg Chest 2 View  Result Date: 02/08/2016 CLINICAL DATA:  Cough. EXAM: CHEST  2 VIEW COMPARISON:  CT 09/06/2015.  Chest x-ray 08/26/2015 . FINDINGS: Mediastinum hilar structures normal. Lungs are clear. Cardiomegaly with normal pulmonary vascularity. No focal infiltrate. No pleural effusion or pneumothorax. Multiple right rib fractures. IMPRESSION: 1.  Cardiomegaly, no pulmonary venous congestion. 2. No focal pulmonary infiltrate. Electronically Signed   By: Marcello Moores  Register   On: 02/08/2016 08:09    Assessment & Plan:   Garvey was seen today for uri.  Diagnoses and all orders for this visit:  RTI (respiratory tract infection)- this has resolved  Essential hypertension, benign- his BP is well controlled   I have discontinued Mr. Kotter's ketoconazole. I am also having him maintain his aspirin, COD LIVER OIL PO, Lancets, Benfotiamine, glucose blood, atorvastatin, metoprolol succinate, valsartan, and sildenafil.  No orders of the defined types were placed in this encounter.    Follow-up: Return if symptoms worsen or fail to improve.  Scarlette Calico, MD

## 2017-03-20 NOTE — Patient Instructions (Signed)
Upper Respiratory Infection, Adult Most upper respiratory infections (URIs) are caused by a virus. A URI affects the nose, throat, and upper air passages. The most common type of URI is often called "the common cold." Follow these instructions at home:  Take medicines only as told by your doctor.  Gargle warm saltwater or take cough drops to comfort your throat as told by your doctor.  Use a warm mist humidifier or inhale steam from a shower to increase air moisture. This may make it easier to breathe.  Drink enough fluid to keep your pee (urine) clear or pale yellow.  Eat soups and other clear broths.  Have a healthy diet.  Rest as needed.  Go back to work when your fever is gone or your doctor says it is okay.  You may need to stay home longer to avoid giving your URI to others.  You can also wear a face mask and wash your hands often to prevent spread of the virus.  Use your inhaler more if you have asthma.  Do not use any tobacco products, including cigarettes, chewing tobacco, or electronic cigarettes. If you need help quitting, ask your doctor. Contact a doctor if:  You are getting worse, not better.  Your symptoms are not helped by medicine.  You have chills.  You are getting more short of breath.  You have brown or red mucus.  You have yellow or brown discharge from your nose.  You have pain in your face, especially when you bend forward.  You have a fever.  You have puffy (swollen) neck glands.  You have pain while swallowing.  You have white areas in the back of your throat. Get help right away if:  You have very bad or constant:  Headache.  Ear pain.  Pain in your forehead, behind your eyes, and over your cheekbones (sinus pain).  Chest pain.  You have long-lasting (chronic) lung disease and any of the following:  Wheezing.  Long-lasting cough.  Coughing up blood.  A change in your usual mucus.  You have a stiff neck.  You have  changes in your:  Vision.  Hearing.  Thinking.  Mood. This information is not intended to replace advice given to you by your health care provider. Make sure you discuss any questions you have with your health care provider. Document Released: 04/24/2008 Document Revised: 07/09/2016 Document Reviewed: 02/11/2014 Elsevier Interactive Patient Education  2017 [employer]  

## 2017-03-20 NOTE — Progress Notes (Signed)
Pre visit review using our clinic review tool, if applicable. No additional management support is needed unless otherwise documented below in the visit note. 

## 2017-03-29 DIAGNOSIS — I739 Peripheral vascular disease, unspecified: Secondary | ICD-10-CM | POA: Diagnosis not present

## 2017-03-29 DIAGNOSIS — L603 Nail dystrophy: Secondary | ICD-10-CM | POA: Diagnosis not present

## 2017-03-29 DIAGNOSIS — E1151 Type 2 diabetes mellitus with diabetic peripheral angiopathy without gangrene: Secondary | ICD-10-CM | POA: Diagnosis not present

## 2017-05-10 DIAGNOSIS — B351 Tinea unguium: Secondary | ICD-10-CM | POA: Diagnosis not present

## 2017-05-10 DIAGNOSIS — M79672 Pain in left foot: Secondary | ICD-10-CM | POA: Diagnosis not present

## 2017-05-10 DIAGNOSIS — M79671 Pain in right foot: Secondary | ICD-10-CM | POA: Diagnosis not present

## 2017-06-04 ENCOUNTER — Encounter: Payer: Self-pay | Admitting: Internal Medicine

## 2017-06-05 ENCOUNTER — Other Ambulatory Visit: Payer: Self-pay | Admitting: Internal Medicine

## 2017-06-05 DIAGNOSIS — I10 Essential (primary) hypertension: Secondary | ICD-10-CM

## 2017-06-05 DIAGNOSIS — E118 Type 2 diabetes mellitus with unspecified complications: Secondary | ICD-10-CM

## 2017-06-05 MED ORDER — TELMISARTAN 80 MG PO TABS: 80.0000 mg | ORAL_TABLET | Freq: Every day | ORAL | 1 refills | Status: DC

## 2017-06-07 ENCOUNTER — Other Ambulatory Visit: Payer: Self-pay | Admitting: Internal Medicine

## 2017-06-07 DIAGNOSIS — B351 Tinea unguium: Secondary | ICD-10-CM | POA: Diagnosis not present

## 2017-06-07 DIAGNOSIS — M79671 Pain in right foot: Secondary | ICD-10-CM | POA: Diagnosis not present

## 2017-06-07 DIAGNOSIS — E118 Type 2 diabetes mellitus with unspecified complications: Secondary | ICD-10-CM

## 2017-06-07 DIAGNOSIS — M79672 Pain in left foot: Secondary | ICD-10-CM | POA: Diagnosis not present

## 2017-06-07 DIAGNOSIS — I10 Essential (primary) hypertension: Secondary | ICD-10-CM

## 2017-06-07 MED ORDER — LOSARTAN POTASSIUM 100 MG PO TABS: 100.0000 mg | ORAL_TABLET | Freq: Every day | ORAL | 3 refills | Status: DC

## 2017-07-02 ENCOUNTER — Encounter: Payer: Self-pay | Admitting: Cardiology

## 2017-07-12 DIAGNOSIS — M79671 Pain in right foot: Secondary | ICD-10-CM | POA: Diagnosis not present

## 2017-07-12 DIAGNOSIS — M79672 Pain in left foot: Secondary | ICD-10-CM | POA: Diagnosis not present

## 2017-07-12 DIAGNOSIS — B351 Tinea unguium: Secondary | ICD-10-CM | POA: Diagnosis not present

## 2017-07-18 ENCOUNTER — Encounter: Payer: Self-pay | Admitting: *Deleted

## 2017-07-18 ENCOUNTER — Ambulatory Visit: Payer: Medicare Other | Admitting: Cardiology

## 2017-07-20 NOTE — Progress Notes (Signed)
Pre visit review using our clinic review tool, if applicable. No additional management support is needed unless otherwise documented below in the visit note. 

## 2017-07-20 NOTE — Progress Notes (Addendum)
Subjective:   Thomas Chung is a 76 y.o. male who presents for Medicare Annual/Subsequent preventive examination.  Review of Systems:  No ROS.  Medicare Wellness Visit. Additional risk factors are reflected in the social history.  Cardiac Risk Factors include: advanced age (>52men, >43 women);diabetes mellitus;dyslipidemia;hypertension;male gender Sleep patterns: feels rested on waking, gets up 1 times nightly to void and sleeps 6-8 hours nightly.    Home Safety/Smoke Alarms: Feels safe in home. Smoke alarms in place.  Living environment; residence and Firearm Safety: 1-story house/ trailer, no firearms, Lives with wife,  no needs for DME, good support system. Seat Belt Safety/Bike Helmet: Wears seat belt.   Male:   CCS- Last 10/05/16, recall 5 years   PSA-  Lab Results  Component Value Date   PSA 0.07 (L) 01/12/2016   PSA 0.12 02/10/2015   PSA 0.11 12/10/2013       Objective:    Vitals: BP 134/78   Pulse 66   Temp 97.9 F (36.6 C) (Oral)   Resp 16   Ht 6\' 4"  (1.93 m)   Wt 263 lb (119.3 kg)   SpO2 97%   BMI 32.01 kg/m   Body mass index is 32.01 kg/m.  Tobacco History  Smoking Status  . Former Smoker  . Quit date: 09/21/1969  Smokeless Tobacco  . Never Used     Counseling given: Not Answered   Past Medical History:  Diagnosis Date  . Depression   . Fatty liver   . Glucose intolerance (impaired glucose tolerance)   . HOH (hard of hearing)    bilateral hearing aides  . Hx of colonic polyps   . Hyperlipidemia   . Hypertension   . Neuromuscular disorder ([place])    neuropathy feet   Past Surgical History:  Procedure Laterality Date  . COLONOSCOPY  2006, 2009, 07/28/2011   2006 12 and 7 mm TVadenoma and adenoma 2009: 3 small adenomas 2012,:97mm rectal polyp  . COLONOSCOPY    . ORIF FINGER FRACTURE  02/08/2012   Procedure: OPEN REDUCTION INTERNAL FIXATION (ORIF) METACARPAL (FINGER) FRACTURE;  Surgeon: Cammie Sickle., MD;  Location: [facility];  Service: Orthopedics;  Laterality: Right;  right small finger middle phalanx  . SQUAMOUS CELL CARCINOMA EXCISION     on scalp and nose  . TIBIA FRACTURE SURGERY  2001   right with hardware  . VASECTOMY    . WRIST FRACTURE SURGERY  2002   right   Family History  Problem Relation Age of Onset  . Arrhythmia Mother   . Heart attack Mother   . Heart disease Mother   . Heart failure Father   . Diabetes Father   . Coronary artery disease Unknown        1st degree relative<60  . Alcohol abuse Unknown   . Diabetes Unknown   . Stomach cancer Sister   . Cancer Neg Hx   . Stroke Neg Hx   . Hyperlipidemia Neg Hx   . Hypertension Neg Hx   . Kidney disease Neg Hx    History  Sexual Activity  . Sexual activity: Yes    Outpatient Encounter Prescriptions as of 07/24/2017  Medication Sig  . aspirin 81 MG tablet Take 81 mg by mouth daily.    Marland Kitchen atorvastatin (LIPITOR) 40 MG tablet TAKE 1 TABLET BY MOUTH DAILY  . Benfotiamine 150 MG CAPS Take by mouth 2 (two) times daily.  . COD LIVER OIL PO Take by mouth. Take  5 ml daily  . glucose blood (BAYER CONTOUR NEXT TEST) test strip Use to test blood sugar once daily I CD 10 code E11. 8  . Lancets MISC Test twice daily  . losartan (COZAAR) 100 MG tablet Take 1 tablet (100 mg total) by mouth daily.  . metoprolol succinate (TOPROL-XL) 50 MG 24 hr tablet TAKE 1 TABLET BY MOUTH DAILY  . sildenafil (REVATIO) 20 MG tablet Take 4 tablets (80 mg total) by mouth daily as needed.   No facility-administered encounter medications on file as of 07/24/2017.     Activities of Daily Living In your present state of health, do you have any difficulty performing the following activities: 07/24/2017  Hearing? N  Vision? N  Difficulty concentrating or making decisions? N  Walking or climbing stairs? N  Dressing or bathing? N  Doing errands, shopping? N  Preparing Food and eating ? N  Using the Toilet? N  In the past six months, have you accidently leaked  urine? N  Do you have problems with loss of bowel control? N  Managing your Medications? N  Managing your Finances? N  Housekeeping or managing your Housekeeping? N  Some recent data might be hidden    Patient Care Team: Janith Lima, MD as PCP - General Sueanne [name], MD as Consulting Physician (Cardiology) Danella Sensing, MD as Consulting Physician (Dermatology) Gatha Mayer, MD as Consulting Physician (Gastroenterology) Calvert Cantor, MD as Consulting Physician (Ophthalmology) Chelsea Aus, DDS as Consulting Physician (Dentistry) Jarome Matin, MD as Consulting Physician (Dermatology)   Assessment:    Physical assessment deferred to PCP.  Exercise Activities and Dietary recommendations Current Exercise Habits: Structured exercise class, Type of exercise: yoga;calisthenics;strength training/weights, Time (Minutes): 45, Intensity: Moderate, Exercise limited by: None identified  Diet (meal preparation, eat out, water intake, caffeinated beverages, dairy products, fruits and vegetables): in general, a "healthy" diet  , well balanced, eats a variety of fruits and vegetables daily, limits salt, fat/cholesterol, sugar, caffeine, drinks 6-8 glasses of water daily.   Goals    . Be as spiritually and physically healThy as possible      Fall Risk Fall Risk  07/24/2017 05/11/2016 05/10/2016 02/10/2015 01/15/2015  Falls in the past year? No No No No No   Depression Screen PHQ 2/9 Scores 07/24/2017 05/11/2016 05/10/2016 02/10/2015  PHQ - 2 Score 0 0 0 0  PHQ- 9 Score 0 - - -    Cognitive Function MMSE - Mini Mental State Exam 07/24/2017  Orientation to time 5  Orientation to Place 5  Registration 3  Attention/ Calculation 5  Recall 3  Language- name 2 objects 2  Language- repeat 1  Language- follow 3 step command 3  Language- read & follow direction 1  Write a sentence 1  Copy design 1  Total score 30        Immunization History  Administered Date(s) Administered  . Influenza  Split 08/29/2011, 08/12/2012  . Influenza Whole 08/17/2009, 08/17/2010  . Influenza, High Dose Seasonal PF 07/23/2015, 08/09/2016, 07/24/2017  . Influenza,inj,Quad PF,6+ Mos 08/07/2013, 07/29/2014  . Pneumococcal Conjugate-13 04/09/2014  . Pneumococcal Polysaccharide-23 08/17/2010  . Td 04/29/2010  . Zoster 08/07/2013   Screening Tests Health Maintenance  Topic Date Due  . INFLUENZA VACCINE  06/20/2017  . HEMOGLOBIN A1C  08/28/2017  . OPHTHALMOLOGY EXAM  10/25/2017  . FOOT EXAM  11/08/2017  . TETANUS/TDAP  04/29/2020  . COLONOSCOPY  10/05/2021  . PNA vac Low Risk Adult  Completed  Plan:  Continue doing brain stimulating activities (puzzles, reading, adult coloring books, staying active) to keep memory sharp.   Continue to eat heart healthy diet (full of fruits, vegetables, whole grains, lean protein, water--limit salt, fat, and sugar intake) and increase physical activity as tolerated.     I have personally reviewed and noted the following in the patient's chart:   . Medical and social history . Use of alcohol, tobacco or illicit drugs  . Current medications and supplements . Functional ability and status . Nutritional status . Physical activity . Advanced directives . List of other physicians . Vitals . Screenings to include cognitive, depression, and falls . Referrals and appointments  In addition, I have reviewed and discussed with patient certain preventive protocols, quality metrics, and best practice recommendations. A written personalized care plan for preventive services as well as general preventive health recommendations were provided to patient.     Michiel Cowboy, RN  07/24/2017  Medical screening examination/treatment/procedure(s) were performed by non-physician practitioner and as supervising physician I was immediately available for consultation/collaboration. I agree with above. Scarlette Calico, MD

## 2017-07-24 ENCOUNTER — Ambulatory Visit: Payer: Medicare Other | Admitting: Internal Medicine

## 2017-07-24 ENCOUNTER — Encounter: Payer: Self-pay | Admitting: Internal Medicine

## 2017-07-24 ENCOUNTER — Other Ambulatory Visit (INDEPENDENT_AMBULATORY_CARE_PROVIDER_SITE_OTHER): Payer: Medicare Other

## 2017-07-24 ENCOUNTER — Ambulatory Visit (INDEPENDENT_AMBULATORY_CARE_PROVIDER_SITE_OTHER): Payer: Medicare Other | Admitting: Internal Medicine

## 2017-07-24 VITALS — BP 134/78 | HR 66 | Temp 97.9°F | Resp 16 | Ht 76.0 in | Wt 263.0 lb

## 2017-07-24 DIAGNOSIS — I10 Essential (primary) hypertension: Secondary | ICD-10-CM

## 2017-07-24 DIAGNOSIS — Z23 Encounter for immunization: Secondary | ICD-10-CM

## 2017-07-24 DIAGNOSIS — H6123 Impacted cerumen, bilateral: Secondary | ICD-10-CM | POA: Diagnosis not present

## 2017-07-24 DIAGNOSIS — E118 Type 2 diabetes mellitus with unspecified complications: Secondary | ICD-10-CM | POA: Diagnosis not present

## 2017-07-24 DIAGNOSIS — Z0000 Encounter for general adult medical examination without abnormal findings: Secondary | ICD-10-CM

## 2017-07-24 LAB — BASIC METABOLIC PANEL
BUN: 24 mg/dL — ABNORMAL HIGH (ref 6–23)
CO2: 26 mEq/L (ref 19–32)
Calcium: 9.6 mg/dL (ref 8.4–10.5)
Chloride: 106 mEq/L (ref 96–112)
Creatinine, Ser: 1.12 mg/dL (ref 0.40–1.50)
GFR: 67.82 mL/min (ref >60.00)
Glucose, Bld: 86 mg/dL (ref 70–99)
Potassium: 4.3 mEq/L (ref 3.5–5.1)
Sodium: 141 mEq/L (ref 135–145)

## 2017-07-24 LAB — HEMOGLOBIN A1C: Hgb A1c MFr Bld: 6.3 % (ref 4.6–6.5)

## 2017-07-24 NOTE — Patient Instructions (Addendum)
Continue doing brain stimulating activities (puzzles, reading, adult coloring books, staying active) to keep memory sharp.   Continue to eat heart healthy diet (full of fruits, vegetables, whole grains, lean protein, water--limit salt, fat, and sugar intake) and increase physical activity as tolerated.   Thomas Chung , Thank you for taking time to come for your Medicare Wellness Visit. I appreciate your ongoing commitment to your health goals. Please review the following plan we discussed and let me know if I can assist you in the future.   These are the goals we discussed: Goals    . Be as spiritually and physically healhy as possible       This is a list of the screening recommended for you and due dates:  Health Maintenance  Topic Date Due  . Flu Shot  06/20/2017  . Hemoglobin A1C  08/28/2017  . Eye exam for diabetics  10/25/2017  . Complete foot exam   11/08/2017  . Tetanus Vaccine  04/29/2020  . Colon Cancer Screening  10/05/2021  . Pneumonia vaccines  Completed      High-Fiber Diet Fiber, also called dietary fiber, is a type of carbohydrate found in fruits, vegetables, whole grains, and beans. A high-fiber diet can have many health benefits. Your health care provider may recommend a high-fiber diet to help:  Prevent constipation. Fiber can make your bowel movements more regular.  Lower your cholesterol.  Relieve hemorrhoids, uncomplicated diverticulosis, or irritable bowel syndrome.  Prevent overeating as part of a weight-loss plan.  Prevent heart disease, type 2 diabetes, and certain cancers.  What is my plan? The recommended daily intake of fiber includes:  38 grams for men under age 20.  36 grams for men over age 63.  62 grams for women under age 52.  60 grams for women over age 23.  You can get the recommended daily intake of dietary fiber by eating a variety of fruits, vegetables, grains, and beans. Your health care provider may also recommend a fiber  supplement if it is not possible to get enough fiber through your diet. What do I need to know about a high-fiber diet?  Fiber supplements have not been widely studied for their effectiveness, so it is better to get fiber through food sources.  Always check the fiber content on thenutrition facts label of any prepackaged food. Look for foods that contain at least 5 grams of fiber per serving.  Ask your dietitian if you have questions about specific foods that are related to your condition, especially if those foods are not listed in the following section.  Increase your daily fiber consumption gradually. Increasing your intake of dietary fiber too quickly may cause bloating, cramping, or gas.  Drink plenty of water. Water helps you to digest fiber. What foods can I eat? Grains Whole-grain breads. Multigrain cereal. Oats and oatmeal. Brown rice. Barley. Bulgur wheat. [place]. Bran muffins. Popcorn. Rye wafer crackers. Vegetables Sweet potatoes. Spinach. Kale. Artichokes. Cabbage. Broccoli. Green peas. Carrots. Squash. Fruits Berries. Pears. Apples. Oranges. Avocados. Prunes and raisins. Dried figs. Meats and Other Protein Sources Navy, kidney, pinto, and soy beans. Split peas. Lentils. Nuts and seeds. Dairy Fiber-fortified yogurt. Beverages Fiber-fortified soy milk. Fiber-fortified orange juice. Other Fiber bars. The items listed above may not be a complete list of recommended foods or beverages. Contact your dietitian for more options. What foods are not recommended? Grains White bread. Pasta made with refined flour. White rice. Vegetables Fried potatoes. Canned vegetables. Well-cooked vegetables. Fruits Fruit juice.  Cooked, strained fruit. Meats and Other Protein Sources Fatty cuts of meat. Fried Sales executive or fried fish. Dairy Milk. Yogurt. Cream cheese. Sour cream. Beverages Soft drinks. Other Cakes and pastries. Butter and oils. The items listed above may not be a complete  list of foods and beverages to avoid. Contact your dietitian for more information. What are some tips for including high-fiber foods in my diet?  Eat a wide variety of high-fiber foods.  Make sure that half of all grains consumed each day are whole grains.  Replace breads and cereals made from refined flour or white flour with whole-grain breads and cereals.  Replace white rice with brown rice, bulgur wheat, or millet.  Start the day with a breakfast that is high in fiber, such as a cereal that contains at least 5 grams of fiber per serving.  Use beans in place of meat in soups, salads, or pasta.  Eat high-fiber snacks, such as berries, raw vegetables, nuts, or popcorn. This information is not intended to replace advice given to you by your health care provider. Make sure you discuss any questions you have with your health care provider. Document Released: 11/06/2005 Document Revised: 04/13/2016 Document Reviewed: 04/21/2014 Elsevier Interactive Patient Education  2017 Elsevier Inc. Influenza Virus Vaccine (Flucelvax) What is this medicine? INFLUENZA VIRUS VACCINE (in floo EN zuh VAHY ruhs vak SEEN) helps to reduce the risk of getting influenza also known as the flu. The vaccine only helps protect you against some strains of the flu. This medicine may be used for other purposes; ask your health care provider or pharmacist if you have questions. COMMON BRAND NAME(S): FLUCELVAX What should I tell my health care provider before I take this medicine? They need to know if you have any of these conditions: -bleeding disorder like hemophilia -fever or infection -Guillain-Barre syndrome or other neurological problems -immune system problems -infection with the human immunodeficiency virus (HIV) or AIDS -low blood platelet counts -multiple sclerosis -an unusual or allergic reaction to influenza virus vaccine, other medicines, foods, dyes or preservatives -pregnant or trying to get  pregnant -breast-feeding How should I use this medicine? This vaccine is for injection into a muscle. It is given by a health care professional. A copy of Vaccine Information Statements will be given before each vaccination. Read this sheet carefully each time. The sheet may change frequently. Talk to your pediatrician regarding the use of this medicine in children. Special care may be needed. Overdosage: If you think you've taken too much of this medicine contact a poison control center or emergency room at once. Overdosage: If you think you have taken too much of this medicine contact a poison control center or emergency room at once. NOTE: This medicine is only for you. Do not share this medicine with others. What if I miss a dose? This does not apply. What may interact with this medicine? -chemotherapy or radiation therapy -medicines that lower your immune system like etanercept, anakinra, infliximab, and adalimumab -medicines that treat or prevent blood clots like warfarin -phenytoin -steroid medicines like prednisone or cortisone -theophylline -vaccines This list may not describe all possible interactions. Give your health care provider a list of all the medicines, herbs, non-prescription drugs, or dietary supplements you use. Also tell them if you smoke, drink alcohol, or use illegal drugs. Some items may interact with your medicine. What should I watch for while using this medicine? Report any side effects that do not go away within 3 days to your doctor or health  care professional. Call your health care provider if any unusual symptoms occur within 6 weeks of receiving this vaccine. You may still catch the flu, but the illness is not usually as bad. You cannot get the flu from the vaccine. The vaccine will not protect against colds or other illnesses that may cause fever. The vaccine is needed every year. What side effects may I notice from receiving this medicine? Side effects that  you should report to your doctor or health care professional as soon as possible: -allergic reactions like skin rash, itching or hives, swelling of the face, lips, or tongue Side effects that usually do not require medical attention (Report these to your doctor or health care professional if they continue or are bothersome.): -fever -headache -muscle aches and pains -pain, tenderness, redness, or swelling at the injection site -tiredness This list may not describe all possible side effects. Call your doctor for medical advice about side effects. You may report side effects to FDA at 1-800-FDA-1088. Where should I keep my medicine? The vaccine will be given by a health care professional in a clinic, pharmacy, doctor's office, or other health care setting. You will not be given vaccine doses to store at home. NOTE: This sheet is a summary. It may not cover all possible information. If you have questions about this medicine, talk to your doctor, pharmacist, or health care provider.  2018 Elsevier/Gold Standard (2011-10-18 14:06:47)

## 2017-07-24 NOTE — Progress Notes (Signed)
Subjective:  Patient ID: Thomas Chung, male    DOB: May 18, 1941  Age: 76 y.o. MRN: [id number]  CC: Medicare Wellness; Diabetes; and Hypertension   HPI Thomas Chung presents for f/up - he complains of LOH and ears feeling "stopped up" over the last few weeks. He offers no other complaints today.  Outpatient Medications Prior to Visit  Medication Sig Dispense Refill  . aspirin 81 MG tablet Take 81 mg by mouth daily.      Marland Kitchen atorvastatin (LIPITOR) 40 MG tablet TAKE 1 TABLET BY MOUTH DAILY 90 tablet 3  . Benfotiamine 150 MG CAPS Take by mouth 2 (two) times daily.    . COD LIVER OIL PO Take by mouth. Take 5 ml daily    . glucose blood (BAYER CONTOUR NEXT TEST) test strip Use to test blood sugar once daily I CD 10 code E11. 8 100 each 3  . Lancets MISC Test twice daily 100 each 11  . losartan (COZAAR) 100 MG tablet Take 1 tablet (100 mg total) by mouth daily. 90 tablet 3  . metoprolol succinate (TOPROL-XL) 50 MG 24 hr tablet TAKE 1 TABLET BY MOUTH DAILY 90 tablet 3  . sildenafil (REVATIO) 20 MG tablet Take 4 tablets (80 mg total) by mouth daily as needed. 60 tablet 11   No facility-administered medications prior to visit.     ROS Review of Systems  Constitutional: Negative.  Negative for diaphoresis, fatigue and unexpected weight change.  HENT: Positive for ear pain and hearing loss. Negative for ear discharge, facial swelling, sore throat and trouble swallowing.   Eyes: Negative.  Negative for visual disturbance.  Respiratory: Negative.  Negative for cough, choking, shortness of breath and wheezing.   Cardiovascular: Negative for chest pain, palpitations and leg swelling.  Gastrointestinal: Negative for abdominal pain, blood in stool, constipation, diarrhea, nausea and vomiting.  Endocrine: Negative.  Negative for polydipsia, polyphagia and polyuria.  Genitourinary: Negative.  Negative for difficulty urinating, dysuria, frequency and urgency.  Musculoskeletal: Negative.   Negative for back pain and myalgias.  Skin: Negative.  Negative for color change and rash.  Allergic/Immunologic: Negative.   Neurological: Negative.   Hematological: Negative for adenopathy. Does not bruise/bleed easily.  Psychiatric/Behavioral: Negative.     Objective:  BP 134/78   Pulse 66   Temp 97.9 F (36.6 C) (Oral)   Resp 16   Ht 6' 4"  (1.93 m)   Wt 263 lb (119.3 kg)   SpO2 97%   BMI 32.01 kg/m   BP Readings from Last 3 Encounters:  07/24/17 134/78  03/20/17 (!) 148/82  03/05/17 122/72    Wt Readings from Last 3 Encounters:  07/24/17 263 lb (119.3 kg)  03/20/17 260 lb 8 oz (118.2 kg)  03/05/17 255 lb (115.7 kg)    Physical Exam  Constitutional: No distress.  HENT:  Right Ear: Hearing, tympanic membrane, external ear and ear canal normal.  Left Ear: Hearing, tympanic membrane, external ear and ear canal normal.  Mouth/Throat: Oropharynx is clear and moist. No oropharyngeal exudate.  There is a cerumen impaction in both external auditory canals. I put Colace in the ears and then irrigated them and used an ear pic to remove the impacted cerumen. After that the examination of his EACs and TMs are normal. His hearing has returned to normal.  Eyes: Conjunctivae are normal. Right eye exhibits no discharge. Left eye exhibits no discharge. No scleral icterus.  Neck: Normal range of motion. Neck supple. No JVD present. No  thyromegaly present.  Cardiovascular: Normal rate, regular rhythm and intact distal pulses.  Exam reveals no gallop and no friction rub.   No murmur heard. Pulmonary/Chest: Effort normal and breath sounds normal. No respiratory distress. He has no wheezes. He has no rales. He exhibits no tenderness.  Abdominal: Soft. Bowel sounds are normal. He exhibits no distension and no mass. There is no tenderness. There is no rebound and no guarding.  Lymphadenopathy:    He has no cervical adenopathy.  Skin: He is not diaphoretic.  Vitals reviewed.   Lab  Results  Component Value Date   WBC 9.0 02/26/2017   HGB 15.8 02/26/2017   HCT 46.6 02/26/2017   PLT 372.0 02/26/2017   GLUCOSE 86 07/24/2017   CHOL 136 02/26/2017   TRIG 87.0 02/26/2017   HDL 37.10 (L) 02/26/2017   LDLCALC 81 02/26/2017   ALT 17 02/26/2017   AST 17 02/26/2017   NA 141 07/24/2017   K 4.3 07/24/2017   CL 106 07/24/2017   CREATININE 1.12 07/24/2017   BUN 24 (H) 07/24/2017   CO2 26 07/24/2017   TSH 1.37 02/26/2017   PSA 0.07 (L) 01/12/2016   HGBA1C 6.3 07/24/2017   MICROALBUR 1.0 02/26/2017    Dg Chest 2 View  Result Date: 02/08/2016 CLINICAL DATA:  Cough. EXAM: CHEST  2 VIEW COMPARISON:  CT 09/06/2015.  Chest x-ray 08/26/2015 . FINDINGS: Mediastinum hilar structures normal. Lungs are clear. Cardiomegaly with normal pulmonary vascularity. No focal infiltrate. No pleural effusion or pneumothorax. Multiple right rib fractures. IMPRESSION: 1.  Cardiomegaly, no pulmonary venous congestion. 2. No focal pulmonary infiltrate. Electronically Signed   By: Marcello Moores  Register   On: 02/08/2016 08:09    Assessment & Plan:   Thomas Chung was seen today for medicare wellness, diabetes and hypertension.  Diagnoses and all orders for this visit:  Type 2 diabetes mellitus with complication, without long-term current use of insulin ([place])- His A1c is down to 6.3%. His blood sugars are adequately well-controlled. His renal function is normal. -     Basic metabolic panel; Future -     Hemoglobin A1c; Future  Essential hypertension, benign- his blood pressures well controlled. Electrolytes and renal function are normal. -     Basic metabolic panel; Future  Need for influenza vaccination -     Flu vaccine HIGH DOSE PF (Fluzone High dose)  Encounter for Medicare annual wellness exam  Hearing loss due to cerumen impaction, bilateral- cerumen impaction removed today.   I am having Thomas Chung maintain his aspirin, COD LIVER OIL PO, Lancets, Benfotiamine, glucose blood, atorvastatin,  metoprolol succinate, sildenafil, and losartan.  No orders of the defined types were placed in this encounter.    Follow-up: Return in about 6 months (around 01/21/2018).  Scarlette Calico, MD

## 2017-08-06 NOTE — Progress Notes (Signed)
Cardiology Office Note:    Date:  08/07/2017   ID:  Thomas Chung, DOB August 16, 1941, MRN [id number]  PCP:  Janith Lima, MD  Cardiologist:  Fransico Him, MD   Referring MD: Janith Lima, MD   Chief Complaint  Patient presents with  . Hypertension  . Hyperlipidemia    History of Present Illness:    Thomas Chung is a 76 y.o. male with a hx of HTN and dyslipidemia.  He is here today for followup and is doing well.  He denies any chest pain or pressure, SOB, DOE, PND, orthopnea, LE edema, dizziness, palpitations or syncope.  He exercises with aerobics and Tai Chi. He has had some problems recently with BP control.  His Valsartan was changed to Losartan which has not kept his BP controlled.  He takes both his antihypertensive meds at night and is not on a diuretic.   Past Medical History:  Diagnosis Date  . Depression   . Fatty liver   . Glucose intolerance (impaired glucose tolerance)   . HOH (hard of hearing)    bilateral hearing aides  . Hx of colonic polyps   . Hyperlipidemia   . Hypertension   . Neuromuscular disorder ([place])    neuropathy feet    Past Surgical History:  Procedure Laterality Date  . COLONOSCOPY  2006, 2009, 07/28/2011   2006 12 and 7 mm TVadenoma and adenoma 2009: 3 small adenomas 2012,:10m rectal polyp  . COLONOSCOPY    . ORIF FINGER FRACTURE  02/08/2012   Procedure: OPEN REDUCTION INTERNAL FIXATION (ORIF) METACARPAL (FINGER) FRACTURE;  Surgeon: RCammie Sickle, MD;  [facility]  Service: Orthopedics;  Laterality: Right;  right small finger middle phalanx  . SQUAMOUS CELL CARCINOMA EXCISION     on scalp and nose  . TIBIA FRACTURE SURGERY  2001   right with hardware  . VASECTOMY    . WRIST FRACTURE SURGERY  2002   right    Current Medications: Current Meds  Medication Sig  . aspirin 81 MG tablet Take 81 mg by mouth daily.    .Marland Kitchenatorvastatin (LIPITOR) 40 MG tablet TAKE 1 TABLET BY MOUTH DAILY  . Benfotiamine 150  MG CAPS Take by mouth 2 (two) times daily.  . COD LIVER OIL PO Take by mouth. Take 5 ml daily  . glucose blood (BAYER CONTOUR NEXT TEST) test strip Use to test blood sugar once daily I CD 10 code E11. 8  . Lancets MISC Test twice daily  . losartan (COZAAR) 100 MG tablet Take 1 tablet (100 mg total) by mouth daily.  . metoprolol succinate (TOPROL-XL) 50 MG 24 hr tablet TAKE 1 TABLET BY MOUTH DAILY  . sildenafil (REVATIO) 20 MG tablet Take 4 tablets (80 mg total) by mouth daily as needed.  . terbinafine (LAMISIL) 250 MG tablet TK 1 T PO QD     Allergies:   Prevnar [pneumococcal 13-val conj vacc]   Social History   Social History  . Marital status: Married    Spouse name: N/A  . Number of children: N/A  . Years of education: N/A   Occupational History  . retired Retired   Social History Main Topics  . Smoking status: Former Smoker    Quit date: 09/21/1969  . Smokeless tobacco: Never Used  . Alcohol use 3.6 oz/week    6 Cans of beer per week  . Drug use: No  . Sexual activity: Yes   Other Topics  Concern  . None   Social History Narrative   No regular exercise     Family History: The patient's family history includes Alcohol abuse in his unknown relative; Arrhythmia in his mother; Coronary artery disease in his unknown relative; Diabetes in his father and unknown relative; Heart attack in his mother; Heart disease in his mother; Heart failure in his father; Stomach cancer in his sister. There is no history of Cancer, Stroke, Hyperlipidemia, Hypertension, or Kidney disease.  ROS:   Please see the history of present illness.     All other systems reviewed and are negative.  EKGs/Labs/Other Studies Reviewed:    The following studies were reviewed today: none  EKG:  EKG is not  ordered today.    Recent Labs: 02/26/2017: ALT 17; Hemoglobin 15.8; Platelets 372.0; TSH 1.37 07/24/2017: BUN 24; Creatinine, Ser 1.12; Potassium 4.3; Sodium 141   Recent Lipid Panel    Component  Value Date/Time   CHOL 136 02/26/2017 1202   TRIG 87.0 02/26/2017 1202   HDL 37.10 (L) 02/26/2017 1202   CHOLHDL 4 02/26/2017 1202   VLDL 17.4 02/26/2017 1202   LDLCALC 81 02/26/2017 1202    Physical Exam:    VS:  BP (!) 148/80   Pulse (!) 57   Ht 6' 4" (1.93 m)   Wt 269 lb (122 kg)   SpO2 94%   BMI 32.74 kg/m     Wt Readings from Last 3 Encounters:  08/07/17 269 lb (122 kg)  07/24/17 263 lb (119.3 kg)  03/20/17 260 lb 8 oz (118.2 kg)     GEN:  Well nourished, well developed in no acute distress HEENT: Normal NECK: No JVD; No carotid bruits LYMPHATICS: No lymphadenopathy CARDIAC: RRR, no murmurs, rubs, gallops RESPIRATORY:  Clear to auscultation without rales, wheezing or rhonchi  ABDOMEN: Soft, non-tender, non-distended MUSCULOSKELETAL:  No edema; No deformity  SKIN: Warm and dry NEUROLOGIC:  Alert and oriented x 3 PSYCHIATRIC:  Normal affect   ASSESSMENT:    1. Essential hypertension, benign   2. Hyperlipidemia with target LDL less than 100    PLAN:    In order of problems listed above:  1. HTN - his BP is borderline controlled on exam today.  He will continue on Toprol XL 69m daily and Losartan 1066mdaily.  I have instructed him to take Losartan in the am and Toprol at night.  I will add Chlorthalidone 2557maily and have him followup in HTN clinic in 1 week. I encouraged him to try to follow a low sodium diet as he is still salting food some and eating out about 4 times weekly.   2. Hyperlipidemia - his LDL goal is < 100. He will continue on atorvastatin 49m61mily.  His last LDL was 81 in April 2018.    Medication Adjustments/Labs and Tests Ordered: Current medicines are reviewed at length with the patient today.  Concerns regarding medicines are outlined above.  No orders of the defined types were placed in this encounter.  No orders of the defined types were placed in this encounter.   Signed, TracFransico Him  08/07/2017 2:02 PM    [facility]

## 2017-08-07 ENCOUNTER — Ambulatory Visit (INDEPENDENT_AMBULATORY_CARE_PROVIDER_SITE_OTHER): Payer: Medicare Other | Admitting: Cardiology

## 2017-08-07 ENCOUNTER — Encounter: Payer: Self-pay | Admitting: Cardiology

## 2017-08-07 VITALS — BP 148/80 | HR 57 | Ht 76.0 in | Wt 269.0 lb

## 2017-08-07 DIAGNOSIS — I10 Essential (primary) hypertension: Secondary | ICD-10-CM

## 2017-08-07 DIAGNOSIS — E785 Hyperlipidemia, unspecified: Secondary | ICD-10-CM

## 2017-08-07 MED ORDER — CHLORTHALIDONE 25 MG PO TABS: ORAL_TABLET | ORAL | 6 refills | Status: DC

## 2017-08-07 NOTE — Patient Instructions (Addendum)
Start taking Losartan 100 mg every morning    Start taking Chlorthalidone 25 mg every morning       Schedule appointment with Hypertension Clinic next available and have bmet done at appointment      Your physician wants you to follow-up in: 1 year. You will receive a reminder letter in the mail two months in advance. If you don't receive a letter, please call our office to schedule the follow-up appointment.

## 2017-08-10 DIAGNOSIS — M79675 Pain in left toe(s): Secondary | ICD-10-CM | POA: Diagnosis not present

## 2017-08-10 DIAGNOSIS — B351 Tinea unguium: Secondary | ICD-10-CM | POA: Diagnosis not present

## 2017-08-10 DIAGNOSIS — M79674 Pain in right toe(s): Secondary | ICD-10-CM | POA: Diagnosis not present

## 2017-08-16 ENCOUNTER — Other Ambulatory Visit: Payer: Self-pay | Admitting: Internal Medicine

## 2017-09-03 ENCOUNTER — Other Ambulatory Visit: Payer: Self-pay | Admitting: Internal Medicine

## 2017-09-05 ENCOUNTER — Other Ambulatory Visit: Payer: Medicare Other | Admitting: *Deleted

## 2017-09-05 ENCOUNTER — Ambulatory Visit (INDEPENDENT_AMBULATORY_CARE_PROVIDER_SITE_OTHER): Payer: Medicare Other | Admitting: Pharmacist

## 2017-09-05 VITALS — BP 144/82 | HR 55

## 2017-09-05 DIAGNOSIS — I10 Essential (primary) hypertension: Secondary | ICD-10-CM | POA: Diagnosis not present

## 2017-09-05 DIAGNOSIS — E785 Hyperlipidemia, unspecified: Secondary | ICD-10-CM

## 2017-09-05 LAB — BASIC METABOLIC PANEL
BUN/Creatinine Ratio: 30 — ABNORMAL HIGH (ref 10–24)
BUN: 34 mg/dL — ABNORMAL HIGH (ref 8–27)
CO2: 23 mmol/L (ref 20–29)
Calcium: 9.6 mg/dL (ref 8.6–10.2)
Chloride: 101 mmol/L (ref 96–106)
Creatinine, Ser: 1.13 mg/dL (ref 0.76–1.27)
GFR calc Af Amer: 73 mL/min/{1.73_m2} (ref >59)
GFR calc non Af Amer: 63 mL/min/{1.73_m2} (ref >59)
Glucose: 102 mg/dL — ABNORMAL HIGH (ref 65–99)
Potassium: 4.1 mmol/L (ref 3.5–5.2)
Sodium: 137 mmol/L (ref 134–144)

## 2017-09-05 MED ORDER — AMLODIPINE BESYLATE 5 MG PO TABS: 5.0000 mg | ORAL_TABLET | Freq: Every day | ORAL | 11 refills | Status: DC

## 2017-09-05 NOTE — Patient Instructions (Signed)
It was nice to meet you today  Start taking amlodipine 5mg  daily - take this in the evening  Continue taking all of your other blood pressure medications  Continue to monitor your blood pressure at home  Try to limit fast food and sodium intake  Follow up in blood pressure clinic in 4 weeks

## 2017-09-05 NOTE — Progress Notes (Signed)
Patient ID: Thomas Chung                 DOB: March 01, 1941                      MRN: [id number]     HPI: Thomas Chung is a 76 y.o. male referred by Dr. Radford Pax to HTN clinic. PMH is significant for HLD and HTN. Pt was affected by valsartan recall and switched to losartan, which was less effective at controlling his BP. BP was elevated to 148/80 at last visit 1 month ago and pt was started on chlorthalidone 64m daily. His Toprol was moved to PM dosing.  Pt reports feeling well overall. He denies dizziness, blurred vision, falls, or headache. He has been taking his losartan and chlorthalidone in the AM and his Toprol in the PM. At his last visit, he was encouraged to follow a low sodium diet since he was adding salt to his food and eating out about 4 times weekly. Pt does a good job of reading sodium content on nutrition labels and only purchasing food with less than 5042mof sodium, however he is still eating fast food multiple times a week. Before his visit this morning, he ate a [employer] sausage egg mcmuffin. He does note that his BP is lower when he uses his sildenafil due to its vasodilatory effects. He drinks ~3 cups of coffee each day and has had 1 so far this morning.  Current HTN meds: Toprol 5053maily (PM), losartan 100m73mily (AM), chlorthalidone 25mg76mly (AM) BP goal: <130/80mmH40mamily History: Coronary artery disease in his unknown relative; Diabetes in his father and unknown relative; Heart attack in his mother; Heart disease in his mother; Heart failure in his father.  Social History: Former smoker ,quit in 1970. Drinks ~6 beers per week. Denies illicit drug use.  Diet: Doesn't add salt to food at home but eats at restaurants frequently. Limits food purchases to < 500mg s75mm on nutrition labels. Drinks 3 cups of coffee each day  Exercise: Tai chi, senior aerobics, walking   Home BP readings: 120-140/60-70s, HR 50-60s. Rare low of 107/58, 171/87 once after eating high  sodium content meal  Wt Readings from Last 3 Encounters:  08/07/17 269 lb (122 kg)  07/24/17 263 lb (119.3 kg)  03/20/17 260 lb 8 oz (118.2 kg)   BP Readings from Last 3 Encounters:  08/07/17 (!) 148/80  07/24/17 134/78  03/20/17 (!) 148/82   Pulse Readings from Last 3 Encounters:  08/07/17 (!) 57  07/24/17 66  03/20/17 (!) 55    Renal function: CrCl cannot be calculated (Patient's most recent lab result is older than the maximum 21 days allowed.).  Past Medical History:  Diagnosis Date  . Depression   . Fatty liver   . Glucose intolerance (impaired glucose tolerance)   . HOH (hard of hearing)    bilateral hearing aides  . Hx of colonic polyps   . Hyperlipidemia   . Hypertension   . Neuromuscular disorder ([facility])   [place]    Current Outpatient Prescriptions on File Prior to Visit  Medication Sig Dispense Refill  . aspirin 81 MG tablet Take 81 mg by mouth daily.      . atorvMarland Kitchenstatin (LIPITOR) 40 MG tablet TAKE 1 TABLET BY MOUTH DAILY 90 tablet 1  . Benfotiamine 150 MG CAPS Take by mouth 2 (two) times daily.    . chlorthalidone (HYGROTON) 25 MG tablet  Take 25 mg every morning 30 tablet 6  . COD LIVER OIL PO Take by mouth. Take 5 ml daily    . CONTOUR NEXT TEST test strip USE TO TEST BLOOD SUGAR ONCE DAILY 100 each 3  . Lancets MISC Test twice daily 100 each 11  . losartan (COZAAR) 100 MG tablet Take 1 tablet (100 mg total) by mouth daily. 90 tablet 3  . metoprolol succinate (TOPROL-XL) 50 MG 24 hr tablet TAKE 1 TABLET BY MOUTH DAILY 90 tablet 1  . sildenafil (REVATIO) 20 MG tablet Take 4 tablets (80 mg total) by mouth daily as needed. 60 tablet 11  . terbinafine (LAMISIL) 250 MG tablet TK 1 T PO QD  0   No current facility-administered medications on file prior to visit.     Allergies  Allergen Reactions  . Prevnar [Pneumococcal 13-Val Conj Vacc]     Joint pain, swelling     Assessment/Plan:  1. Hypertension - BP is improved but remains above goal  <130/60mHg. Clinic reading is consistent with home readings, which tend to fluctuate based on sodium content of patient's meals. He will continue to work on decreasing intake of fast food meals as this is likely driving most of patient's elevated BP readings at home. Will also start amlodipine 560mdaily. Checking BMET today with recent addition of chlorthalidone. Pt will continue his Toprol 5089maily, losartan 100m22mily, and chlorthalidone 25mg40mly. F/u in HTN clinic in 4 weeks.  Megan E. Supple, PharmD, CPP, BCACPSatellite Beach [id number]: [phone]: [phone]/2018 11:07 AM

## 2017-09-09 ENCOUNTER — Encounter: Payer: Self-pay | Admitting: Cardiology

## 2017-09-17 DIAGNOSIS — L570 Actinic keratosis: Secondary | ICD-10-CM | POA: Diagnosis not present

## 2017-09-17 DIAGNOSIS — L821 Other seborrheic keratosis: Secondary | ICD-10-CM | POA: Diagnosis not present

## 2017-09-17 DIAGNOSIS — L812 Freckles: Secondary | ICD-10-CM | POA: Diagnosis not present

## 2017-09-17 DIAGNOSIS — Z85828 Personal history of other malignant neoplasm of skin: Secondary | ICD-10-CM | POA: Diagnosis not present

## 2017-09-17 DIAGNOSIS — D225 Melanocytic nevi of trunk: Secondary | ICD-10-CM | POA: Diagnosis not present

## 2017-09-17 DIAGNOSIS — D2272 Melanocytic nevi of left lower limb, including hip: Secondary | ICD-10-CM | POA: Diagnosis not present

## 2017-09-17 DIAGNOSIS — D2271 Melanocytic nevi of right lower limb, including hip: Secondary | ICD-10-CM | POA: Diagnosis not present

## 2017-10-03 ENCOUNTER — Ambulatory Visit (INDEPENDENT_AMBULATORY_CARE_PROVIDER_SITE_OTHER): Payer: Medicare Other | Admitting: Pharmacist

## 2017-10-03 VITALS — BP 146/72 | HR 59

## 2017-10-03 DIAGNOSIS — I10 Essential (primary) hypertension: Secondary | ICD-10-CM | POA: Diagnosis not present

## 2017-10-03 MED ORDER — AMLODIPINE BESYLATE 10 MG PO TABS: 10.0000 mg | ORAL_TABLET | Freq: Every day | ORAL | 11 refills | Status: DC

## 2017-10-03 NOTE — Progress Notes (Signed)
Patient ID: LILBURN STRAW                 DOB: 15-May-1941                      MRN: [id number]     HPI: Thomas Chung is a 76 y.o. male referred by Dr. Radford Pax to HTN clinic. PMH is significant for HLD and HTN. Pt was recently affected by valsartan recall and switched to losartan, which was less effective for BP control so chlorthalidone was added. At initial HTN clinic visit pt reported fluctuating BPs at home based upon Na intake with meals. BP remained uncontrolled so amlodipine '5mg'$  was started and he was encouraged to reduce salt intake.  Pt presents to clinic in good spirits. He reports that his BP has improved at home since starting the amlodipine and he has not had AEs. He denies falls, HA, blurry vision, and dizziness.  He has increased his exercise routine and tries to remain active. He reports he has tried to do better with limiting Na but this has been difficult when he goes out to eat. He does note that he had salty soup yesterday.   Current HTN meds: Toprol XL '50mg'$  daily (AM), losartan '100mg'$  daily (AM), chlorthalidone '25mg'$  daily (PM), amlodipine '5mg'$  daily (PM)  Previously Tried: valsartan (recall)  BP goal: <130/46mHg  Family History: Coronary artery disease in his unknown relative; Diabetes in his father and unknown relative; Heart attack in his mother; Heart disease in his mother; Heart failure in his father.  Social History: Former smoker, quit in 1970. Drinks ~6 beers per week. Denies illicit drug use.  Diet: Doesn't add salt to food at home but eats at restaurants frequently. Limits food purchases to < '500mg'$  sodium on nutrition labels. Drinks 3 cups of coffee each day  Exercise: Tai chi, senior aerobics, or walking about 4x per week  Home BP readings: BP 120-130s/70s, HR 60s  Wt Readings from Last 3 Encounters:  08/07/17 269 lb (122 kg)  07/24/17 263 lb (119.3 kg)  03/20/17 260 lb 8 oz (118.2 kg)   BP Readings from Last 3 Encounters:  09/05/17 (!) 144/82    08/07/17 (!) 148/80  07/24/17 134/78   Pulse Readings from Last 3 Encounters:  09/05/17 (!) 55  08/07/17 (!) 57  07/24/17 66    Renal function: CrCl cannot be calculated (Patient's most recent lab result is older than the maximum 21 days allowed.).  Past Medical History:  Diagnosis Date  . Depression   . Fatty liver   . Glucose intolerance (impaired glucose tolerance)   . HOH (hard of hearing)    bilateral hearing aides  . Hx of colonic polyps   . Hyperlipidemia   . Hypertension   . [place]romuscular disorder ([place]    neuropathy feet    Current Outpatient Medications on File Prior to Visit  Medication Sig Dispense Refill  . amLODipine (NORVASC) 5 MG tablet Take 1 tablet (5 mg total) by mouth daily. 30 tablet 11  . aspirin 81 MG tablet Take 81 mg by mouth daily.      .Marland Kitchenatorvastatin (LIPITOR) 40 MG tablet TAKE 1 TABLET BY MOUTH DAILY 90 tablet 1  . Benfotiamine 150 MG CAPS Take by mouth 2 (two) times daily.    . chlorthalidone (HYGROTON) 25 MG tablet Take 25 mg every morning 30 tablet 6  . COD LIVER OIL PO Take by mouth. Take 5 ml daily    .  CONTOUR NEXT TEST test strip USE TO TEST BLOOD SUGAR ONCE DAILY 100 each 3  . Lancets MISC Test twice daily 100 each 11  . losartan (COZAAR) 100 MG tablet Take 1 tablet (100 mg total) by mouth daily. 90 tablet 3  . metoprolol succinate (TOPROL-XL) 50 MG 24 hr tablet TAKE 1 TABLET BY MOUTH DAILY 90 tablet 1  . sildenafil (REVATIO) 20 MG tablet Take 4 tablets (80 mg total) by mouth daily as needed. 60 tablet 11  . terbinafine (LAMISIL) 250 MG tablet TK 1 T PO QD  0   No current facility-administered medications on file prior to visit.     Allergies  Allergen Reactions  . Prevnar [Pneumococcal 13-Val Conj Vacc]     Joint pain, swelling     Assessment/Plan:  1. Hypertension - BP remains above goal <130/80 mmHg in clinic today. Pt endorses eating salty soup yesterday which could be contributing to poor BP control in clinic this  morning, however about half of SBP readings at home have been above goal as well. Will continue chlorthalidone '25mg'$  daily, metoprolol succinate '50mg'$  daily, and losartan '100mg'$  daily and increase amlodipine to '10mg'$  daily. Pt encouraged to remain active and continue to try to limit salt intake. Will F/U in clinic in 4 weeks. If BP remains elevated, may need to consider spironolactone or hydralazine.   Arrie Senate, PharmD PGY-2 Cardiology Pharmacy Resident Pager: [phone] 10/03/2017

## 2017-10-03 NOTE — Patient Instructions (Signed)
It was great to see you today.  Continue taking chlorthalidone 25mg  daily, metoprolol succinate 50mg  daily, and losartan 100mg  daily.  Increase your amlodipine to 10mg  daily.  Return to clinic in 4 weeks.

## 2017-10-08 DIAGNOSIS — I739 Peripheral vascular disease, unspecified: Secondary | ICD-10-CM | POA: Diagnosis not present

## 2017-10-08 DIAGNOSIS — M79672 Pain in left foot: Secondary | ICD-10-CM | POA: Diagnosis not present

## 2017-10-08 DIAGNOSIS — B351 Tinea unguium: Secondary | ICD-10-CM | POA: Diagnosis not present

## 2017-10-08 DIAGNOSIS — E1151 Type 2 diabetes mellitus with diabetic peripheral angiopathy without gangrene: Secondary | ICD-10-CM | POA: Diagnosis not present

## 2017-10-08 DIAGNOSIS — L603 Nail dystrophy: Secondary | ICD-10-CM | POA: Diagnosis not present

## 2017-10-08 DIAGNOSIS — M79671 Pain in right foot: Secondary | ICD-10-CM | POA: Diagnosis not present

## 2017-10-26 LAB — HM DIABETES EYE EXAM

## 2017-11-07 ENCOUNTER — Ambulatory Visit (INDEPENDENT_AMBULATORY_CARE_PROVIDER_SITE_OTHER): Payer: Medicare Other | Admitting: Pharmacist

## 2017-11-07 VITALS — BP 126/72 | HR 60

## 2017-11-07 DIAGNOSIS — H02834 Dermatochalasis of left upper eyelid: Secondary | ICD-10-CM | POA: Diagnosis not present

## 2017-11-07 DIAGNOSIS — I10 Essential (primary) hypertension: Secondary | ICD-10-CM

## 2017-11-07 DIAGNOSIS — H02831 Dermatochalasis of right upper eyelid: Secondary | ICD-10-CM | POA: Diagnosis not present

## 2017-11-07 DIAGNOSIS — E119 Type 2 diabetes mellitus without complications: Secondary | ICD-10-CM | POA: Diagnosis not present

## 2017-11-07 DIAGNOSIS — H35033 Hypertensive retinopathy, bilateral: Secondary | ICD-10-CM | POA: Diagnosis not present

## 2017-11-07 LAB — HM DIABETES EYE EXAM

## 2017-11-07 NOTE — Progress Notes (Signed)
Patient ID: Thomas Chung                 DOB: 05/27/1941                      MRN: [id number]     HPI: Thomas Chung is a 76 y.o. male patient of Thomas Chung who presents today for hypertension follow up. PMH is significant for HLD and HTN. Pt was recently affected by valsartan recall and switched to losartan, which was less effective for BP control so chlorthalidone was added. At initial HTN clinic visit pt reported fluctuating BPs at home based upon Na intake with meals. BP remained uncontrolled so amlodipine 26m was started and he was encouraged to reduce salt intake. At his most recent visit his amlodipine was further titrated to 142mdaily.  He presents today for follow up. Pt reports wife noticed some swelling in legs, but he states he has not noticed except for days after excessive salt intake. He feels he is doing well and has seen improvement in his blood pressures.    Current HTN meds: Toprol XL 5035maily (PM), losartan 100m71mily (AM), chlorthalidone 25mg50mly (AM), amlodipine 10mg 30my (PM)  Previously Tried: valsartan (recall)  BP goal:<130/80mmHg52mmily History:Coronary artery disease in his unknown relative; Diabetes in his father and unknown relative; Heart attack in his mother; Heart disease in his mother; Heart failure in his father.  Social History:Former smoker, quit in 1970. Drinks ~6 beers per week. Denies illicit drug use.  Diet:Doesn't add salt to food at home but eats at restaurants frequently. Limits food purchases to < 500mg so39m on nutrition labels. Drinks 3 cups of coffee each day  Exercise:Tai chi, senior aerobics, or walkingabout 4x per week  Home BP readings:   Wt Readings from Last 3 Encounters:  08/07/17 269 lb (122 kg)  07/24/17 263 lb (119.3 kg)  03/20/17 260 lb 8 oz (118.2 kg)   BP Readings from Last 3 Encounters:  11/07/17 126/72  10/03/17 (!) 146/72  09/05/17 (!) 144/82   Pulse Readings from Last 3 Encounters:    11/07/17 60  10/03/17 (!) 59  09/05/17 (!) 55    Renal function: CrCl cannot be calculated (Patient's most recent lab result is older than the maximum 21 days allowed.).  Past Medical History:  Diagnosis Date  . Depression   . Fatty liver   . Glucose intolerance (impaired glucose tolerance)   . HOH (hard of hearing)    bilateral hearing aides  . Hx of colonic polyps   . Hyperlipidemia   . Hypertension   . Neuromuscular disorder ([facility])    [place]    Current Outpatient Medications on File Prior to Visit  Medication Sig Dispense Refill  . amLODipine (NORVASC) 10 MG tablet Take 1 tablet (10 mg total) daily by mouth. 30 tablet 11  . aspirin 81 MG tablet Take 81 mg by mouth daily.      . atorvaMarland Kitchentatin (LIPITOR) 40 MG tablet TAKE 1 TABLET BY MOUTH DAILY 90 tablet 1  . Benfotiamine 150 MG CAPS Take by mouth 2 (two) times daily.    . chlorthalidone (HYGROTON) 25 MG tablet Take 25 mg every morning 30 tablet 6  . COD LIVER OIL PO Take by mouth. Take 5 ml daily    . CONTOUR NEXT TEST test strip USE TO TEST BLOOD SUGAR ONCE DAILY 100 each 3  . Lancets MISC Test twice daily 100 each 11  .  losartan (COZAAR) 100 MG tablet Take 1 tablet (100 mg total) by mouth daily. 90 tablet 3  . metoprolol succinate (TOPROL-XL) 50 MG 24 hr tablet TAKE 1 TABLET BY MOUTH DAILY 90 tablet 1  . sildenafil (REVATIO) 20 MG tablet Take 4 tablets (80 mg total) by mouth daily as needed. 60 tablet 11  . terbinafine (LAMISIL) 250 MG tablet TK 1 T PO QD  0   No current facility-administered medications on file prior to visit.     Allergies  Allergen Reactions  . Prevnar [Pneumococcal 13-Val Conj Vacc]     Joint pain, swelling    Blood pressure 126/72, pulse 60.   Assessment/Plan: Hypertension: BP today at goal. No change with medications. He will continue to monitor his pressures and for any swelling in his ankles. Follow up in HTN clinic in 2 months. Advised to call sooner if any persistent swelling  develops.    Thank you, Thomas Chung. Thomas Chung, [place]

## 2017-11-07 NOTE — Patient Instructions (Signed)
Return for a follow up appointment in 2 months  Check your blood pressure at home daily (if able) and keep record of the readings.  Take your BP meds as follows: Continue all as prescribed  [id number] - Thomas Chung   Bring all of your meds, your BP cuff and your record of home blood pressures to your next appointment.  Exercise as you're able, try to walk approximately 30 minutes per day.  Keep salt intake to a minimum, especially watch canned and prepared boxed foods.  Eat more fresh fruits and vegetables and fewer canned items.  Avoid eating in fast food restaurants.    HOW TO TAKE YOUR BLOOD PRESSURE: . Rest 5 minutes before taking your blood pressure. .  Don't smoke or drink caffeinated beverages for at least 30 minutes before. . Take your blood pressure before (not after) you eat. . Sit comfortably with your back supported and both feet on the floor (don't cross your legs). . Elevate your arm to heart level on a table or a desk. . Use the proper sized cuff. It should fit smoothly and snugly around your bare upper arm. There should be enough room to slip a fingertip under the cuff. The bottom edge of the cuff should be 1 inch above the crease of the elbow. . Ideally, take 3 measurements at one sitting and record the average.

## 2017-11-18 ENCOUNTER — Encounter: Payer: Self-pay | Admitting: Pharmacist

## 2017-11-27 ENCOUNTER — Other Ambulatory Visit: Payer: Self-pay | Admitting: Internal Medicine

## 2017-11-27 DIAGNOSIS — E118 Type 2 diabetes mellitus with unspecified complications: Secondary | ICD-10-CM

## 2017-11-27 DIAGNOSIS — I10 Essential (primary) hypertension: Secondary | ICD-10-CM

## 2017-11-27 DIAGNOSIS — E785 Hyperlipidemia, unspecified: Secondary | ICD-10-CM

## 2017-11-27 MED ORDER — LOSARTAN POTASSIUM 100 MG PO TABS: 100.0000 mg | ORAL_TABLET | Freq: Every day | ORAL | 1 refills | Status: DC

## 2017-11-27 MED ORDER — ATORVASTATIN CALCIUM 40 MG PO TABS: 40.0000 mg | ORAL_TABLET | Freq: Every day | ORAL | 1 refills | Status: DC

## 2017-11-27 MED ORDER — METOPROLOL SUCCINATE ER 50 MG PO TB24: 50.0000 mg | ORAL_TABLET | Freq: Every day | ORAL | 1 refills | Status: DC

## 2017-11-29 ENCOUNTER — Other Ambulatory Visit: Payer: Self-pay | Admitting: Cardiology

## 2017-11-29 MED ORDER — CHLORTHALIDONE 25 MG PO TABS: ORAL_TABLET | ORAL | 2 refills | Status: DC

## 2017-11-29 MED ORDER — AMLODIPINE BESYLATE 10 MG PO TABS: 10.0000 mg | ORAL_TABLET | Freq: Every day | ORAL | 2 refills | Status: DC

## 2017-12-18 ENCOUNTER — Telehealth: Payer: Self-pay

## 2017-12-18 NOTE — Telephone Encounter (Signed)
error 

## 2017-12-27 ENCOUNTER — Telehealth: Payer: Self-pay | Admitting: Pharmacist

## 2017-12-27 ENCOUNTER — Other Ambulatory Visit: Payer: Self-pay | Admitting: *Deleted

## 2017-12-27 NOTE — Telephone Encounter (Signed)
PT called to report his pressure has been 29-19T systolic today and he has been feeling weak and dizzy. He states until today his pressures have done well on his current doses ranging 110-120s/70s.  Advised he take only 5mg  amlodipine tonight. If symptoms worsen to head in to be evaluated. Also advised that if pressures remain low tomorrow to call and plan to continue amlodipine 5mg  daily. If pressures return to normal resume 10mg  daily and continue to monitor. He does have an apt in HTN clinic in 2 weeks which can be arranged for sooner if needed.

## 2018-01-01 ENCOUNTER — Encounter: Payer: Self-pay | Admitting: Internal Medicine

## 2018-01-04 MED ORDER — GLUCOSE BLOOD VI STRP: ORAL_STRIP | 12 refills | Status: DC

## 2018-01-04 MED ORDER — TRUEPLUS LANCETS 30G MISC: 3 refills | Status: DC

## 2018-01-04 MED ORDER — BLOOD GLUCOSE MONITOR KIT: PACK | 0 refills | Status: DC

## 2018-01-09 ENCOUNTER — Ambulatory Visit (INDEPENDENT_AMBULATORY_CARE_PROVIDER_SITE_OTHER): Payer: Medicare HMO | Admitting: Pharmacist

## 2018-01-09 DIAGNOSIS — I10 Essential (primary) hypertension: Secondary | ICD-10-CM | POA: Diagnosis not present

## 2018-01-09 NOTE — Progress Notes (Signed)
Patient ID: Thomas Chung                 DOB: 1941/08/10                      MRN: [id number]     HPI: MARSHAL ESKEW is a 77 y.o. male patient of Dr. Radford Pax who presents today for hypertension follow up. PMH is significant for HLD and HTN. Pt was recently affected by valsartan recall and switched to losartan, which was less effective for BP control so chlorthalidone was added. At initial HTN clinic visit pt reported fluctuating BPs at home based upon Na intake with meals. BP remained uncontrolled so amlodipine 88m was started and he was encouraged to reduce salt intake. At his most recent visit his medications were maintained. He has since called with low systolic measurements ([id number] and dizziness. He was advised to take 1/2 amlodipine dose that day and call if pressures remained low.   He presents today for follow up. In the last 2 weeks he has taken the lower dose of amlodipine 3 times. When he takes sildenafil generally the day after are the days that he has low pressures.  He also reports that he notices that his pressure is higher a day after dietary indiscretion (high sodium meal). He has had some dizziness with the lower pressures. He denies chest pain and SOB.     Current HTN meds: Toprol XL 540mdaily (PM), losartan 10044maily (AM), chlorthalidone 68m30mily (AM), amlodipine 10mg66mly (PM)  Previously Tried: valsartan (recall)  BP goal:<130/80mmH54mamily History:Coronary artery disease in his unknown relative; Diabetes in his father and unknown relative; Heart attack in his mother; Heart disease in his mother; Heart failure in his father.  Social History:Former smoker, quit in 1970. Drinks ~6 beers per week. Denies illicit drug use.  Diet:Doesn't add salt to food at home but eats at restaurants frequently. Limits food purchases to < 500mg s59mm on nutrition labels. Drinks 3 cups of coffee each day  Exercise:Tai chi, senior aerobics, or walkingabout 4x per  week  Home BP readings: 89-145/58-80 - mostly 110s/60s HR 50s and 60s  Wt Readings from Last 3 Encounters:  08/07/17 269 lb (122 kg)  07/24/17 263 lb (119.3 kg)  03/20/17 260 lb 8 oz (118.2 kg)   BP Readings from Last 3 Encounters:  01/09/18 106/68  11/07/17 126/72  10/03/17 (!) 146/72   Pulse Readings from Last 3 Encounters:  01/09/18 (!) 52  11/07/17 60  10/03/17 (!) 59    Renal function: CrCl cannot be calculated (Patient's most recent lab result is older than the maximum 21 days allowed.).  Past Medical History:  Diagnosis Date  . Depression   . Fatty liver   . Glucose intolerance (impaired glucose tolerance)   . HOH (hard of hearing)    bilateral hearing aides  . Hx of colonic polyps   . Hyperlipidemia   . Hypertension   . Neuromuscular disorder (HCC)   [place]    Current Outpatient Medications on File Prior to Visit  Medication Sig Dispense Refill  . amLODipine (NORVASC) 10 MG tablet Take 1 tablet (10 mg total) by mouth daily. 90 tablet 2  . aspirin 81 MG tablet Take 81 mg by mouth daily.      . atorvMarland Kitchenstatin (LIPITOR) 40 MG tablet Take 1 tablet (40 mg total) by mouth daily. 90 tablet 1  . Benfotiamine 150 MG CAPS Take by mouth  2 (two) times daily.    . blood glucose meter kit and supplies KIT Use to test blood sugar twice daily. DX: E11.8 1 each 0  . chlorthalidone (HYGROTON) 25 MG tablet Take 25 mg every morning 90 tablet 2  . COD LIVER OIL PO Take by mouth. Take 5 ml daily    . glucose blood (COOL BLOOD GLUCOSE TEST STRIPS) test strip Use as instructed to test blood sugar twice daily. DX: E11.8 100 each 12  . losartan (COZAAR) 100 MG tablet Take 1 tablet (100 mg total) by mouth daily. 90 tablet 1  . metoprolol succinate (TOPROL-XL) 25 MG 24 hr tablet Take 2 tablets (50 mg total) by mouth daily. Take with or immediately following a meal.    . sildenafil (REVATIO) 20 MG tablet Take 4 tablets (80 mg total) by mouth daily as needed. 60 tablet 11  .  TRUEPLUS LANCETS 30G MISC Use to test blood sugar twice daily. E11.8 200 each 3   No current facility-administered medications on file prior to visit.     Allergies  Allergen Reactions  . Prevnar [Pneumococcal 13-Val Conj Vacc]     Joint pain, swelling    Blood pressure 106/68, pulse (!) 52.   Assessment/Plan: Hypertension: BP today is below goal. HR is also low. Will decrease his metoprolol succinate to 74m daily to hopefully prevent low blood pressure readings and help with low HR. Will continue all other medications as prescribed. Will follow up in HTN clinic in 3-4 weeks for additional medication adjustment.    Thank you, KLelan Pons APatterson Hammersmith PDelco

## 2018-01-09 NOTE — Patient Instructions (Addendum)
Return for a follow up appointment in 3-4 weeks  Check your blood pressure at home daily (if able) and keep record of the readings.  Take your BP meds as follows: DECREASE metoprolol to 25mg  daily (take 1/2 tablet of your current supply) CONTINUE all other medications as prescribed  Bring all of your meds, your BP cuff and your record of home blood pressures to your next appointment.  Exercise as you're able, try to walk approximately 30 minutes per day.  Keep salt intake to a minimum, especially watch canned and prepared boxed foods.  Eat more fresh fruits and vegetables and fewer canned items.  Avoid eating in fast food restaurants.    HOW TO TAKE YOUR BLOOD PRESSURE: . Rest 5 minutes before taking your blood pressure. .  Don't smoke or drink caffeinated beverages for at least 30 minutes before. . Take your blood pressure before (not after) you eat. . Sit comfortably with your back supported and both feet on the floor (don't cross your legs). . Elevate your arm to heart level on a table or a desk. . Use the proper sized cuff. It should fit smoothly and snugly around your bare upper arm. There should be enough room to slip a fingertip under the cuff. The bottom edge of the cuff should be 1 inch above the crease of the elbow. . Ideally, take 3 measurements at one sitting and record the average.

## 2018-01-22 ENCOUNTER — Other Ambulatory Visit (INDEPENDENT_AMBULATORY_CARE_PROVIDER_SITE_OTHER): Payer: Medicare HMO

## 2018-01-22 ENCOUNTER — Ambulatory Visit (INDEPENDENT_AMBULATORY_CARE_PROVIDER_SITE_OTHER): Payer: Medicare HMO | Admitting: Internal Medicine

## 2018-01-22 ENCOUNTER — Encounter: Payer: Self-pay | Admitting: Internal Medicine

## 2018-01-22 VITALS — BP 126/70 | HR 72 | Temp 97.7°F | Resp 16 | Ht 76.0 in | Wt 268.0 lb

## 2018-01-22 DIAGNOSIS — E785 Hyperlipidemia, unspecified: Secondary | ICD-10-CM

## 2018-01-22 DIAGNOSIS — N138 Other obstructive and reflux uropathy: Secondary | ICD-10-CM | POA: Diagnosis not present

## 2018-01-22 DIAGNOSIS — N403 Nodular prostate with lower urinary tract symptoms: Secondary | ICD-10-CM | POA: Diagnosis not present

## 2018-01-22 DIAGNOSIS — E118 Type 2 diabetes mellitus with unspecified complications: Secondary | ICD-10-CM | POA: Diagnosis not present

## 2018-01-22 DIAGNOSIS — I10 Essential (primary) hypertension: Secondary | ICD-10-CM

## 2018-01-22 DIAGNOSIS — Z20828 Contact with and (suspected) exposure to other viral communicable diseases: Secondary | ICD-10-CM | POA: Diagnosis not present

## 2018-01-22 DIAGNOSIS — K219 Gastro-esophageal reflux disease without esophagitis: Secondary | ICD-10-CM | POA: Insufficient documentation

## 2018-01-22 LAB — MICROALBUMIN / CREATININE URINE RATIO
Creatinine,U: 63.6 mg/dL
Microalb Creat Ratio: 1.2 mg/g (ref 0.0–30.0)
Microalb, Ur: 0.7 mg/dL (ref 0.0–1.9)

## 2018-01-22 LAB — CBC WITH DIFFERENTIAL/PLATELET
Basophils Absolute: 0.1 10*3/uL (ref 0.0–0.1)
Basophils Relative: 0.8 % (ref 0.0–3.0)
Eosinophils Absolute: 0.5 10*3/uL (ref 0.0–0.7)
Eosinophils Relative: 4.2 % (ref 0.0–5.0)
HCT: 45.6 % (ref 39.0–52.0)
Hemoglobin: 15.1 g/dL (ref 13.0–17.0)
Lymphocytes Relative: 9.3 % — ABNORMAL LOW (ref 12.0–46.0)
Lymphs Abs: 1.1 10*3/uL (ref 0.7–4.0)
MCHC: 33.2 g/dL (ref 30.0–36.0)
MCV: 84.8 fl (ref 78.0–100.0)
Monocytes Absolute: 1.2 10*3/uL — ABNORMAL HIGH (ref 0.1–1.0)
Monocytes Relative: 10.2 % (ref 3.0–12.0)
Neutro Abs: 8.6 10*3/uL — ABNORMAL HIGH (ref 1.4–7.7)
Neutrophils Relative %: 75.5 % (ref 43.0–77.0)
Platelets: 428 10*3/uL — ABNORMAL HIGH (ref 150.0–400.0)
RBC: 5.38 Mil/uL (ref 4.22–5.81)
RDW: 13.8 % (ref 11.5–15.5)
WBC: 11.4 10*3/uL — ABNORMAL HIGH (ref 4.0–10.5)

## 2018-01-22 LAB — HEMOGLOBIN A1C: Hgb A1c MFr Bld: 6.4 % (ref 4.6–6.5)

## 2018-01-22 LAB — COMPREHENSIVE METABOLIC PANEL
ALT: 18 U/L (ref 0–53)
AST: 17 U/L (ref 0–37)
Albumin: 4.4 g/dL (ref 3.5–5.2)
Alkaline Phosphatase: 61 U/L (ref 39–117)
BUN: 24 mg/dL — ABNORMAL HIGH (ref 6–23)
CO2: 28 mEq/L (ref 19–32)
Calcium: 9.9 mg/dL (ref 8.4–10.5)
Chloride: 101 mEq/L (ref 96–112)
Creatinine, Ser: 1.05 mg/dL (ref 0.40–1.50)
GFR: 72.96 mL/min (ref >60.00)
Glucose, Bld: 81 mg/dL (ref 70–99)
Potassium: 4.1 mEq/L (ref 3.5–5.1)
Sodium: 136 mEq/L (ref 135–145)
Total Bilirubin: 0.9 mg/dL (ref 0.2–1.2)
Total Protein: 6.8 g/dL (ref 6.0–8.3)

## 2018-01-22 LAB — LIPID PANEL
Cholesterol: 114 mg/dL (ref 0–200)
HDL: 38.1 mg/dL — ABNORMAL LOW (ref >39.00)
LDL Cholesterol: 54 mg/dL (ref 0–99)
NonHDL: 75.54
Total CHOL/HDL Ratio: 3
Triglycerides: 108 mg/dL (ref 0.0–149.0)
VLDL: 21.6 mg/dL (ref 0.0–40.0)

## 2018-01-22 LAB — URINALYSIS, ROUTINE W REFLEX MICROSCOPIC
Bilirubin Urine: NEGATIVE
Hgb urine dipstick: NEGATIVE
Ketones, ur: NEGATIVE
Leukocytes, UA: NEGATIVE
Nitrite: NEGATIVE
RBC / HPF: NONE SEEN (ref 0–?)
Specific Gravity, Urine: 1.015 (ref 1.000–1.030)
Total Protein, Urine: NEGATIVE
Urine Glucose: NEGATIVE
Urobilinogen, UA: 0.2 (ref 0.0–1.0)
WBC, UA: NONE SEEN (ref 0–?)
pH: 6 (ref 5.0–8.0)

## 2018-01-22 LAB — PSA: PSA: 0.07 ng/mL — ABNORMAL LOW (ref 0.10–4.00)

## 2018-01-22 MED ORDER — OSELTAMIVIR PHOSPHATE 75 MG PO CAPS: 75.0000 mg | ORAL_CAPSULE | Freq: Every day | ORAL | 0 refills | Status: AC

## 2018-01-22 MED ORDER — RANITIDINE HCL 150 MG PO CAPS: 150.0000 mg | ORAL_CAPSULE | Freq: Two times a day (BID) | ORAL | 1 refills | Status: DC

## 2018-01-22 NOTE — Patient Instructions (Signed)
Gastroesophageal Reflux Disease, Adult Normally, food travels down the esophagus and stays in the stomach to be digested. However, when a person has gastroesophageal reflux disease (GERD), food and stomach acid move back up into the esophagus. When this happens, the esophagus becomes sore and inflamed. Over time, GERD can create small holes (ulcers) in the lining of the esophagus. What are the causes? This condition is caused by a problem with the muscle between the esophagus and the stomach (lower esophageal sphincter, or LES). Normally, the LES muscle closes after food passes through the esophagus to the stomach. When the LES is weakened or abnormal, it does not close properly, and that allows food and stomach acid to go back up into the esophagus. The LES can be weakened by certain dietary substances, medicines, and medical conditions, including:  Tobacco use.  Pregnancy.  Having a hiatal hernia.  Heavy alcohol use.  Certain foods and beverages, such as coffee, chocolate, onions, and peppermint.  What increases the risk? This condition is more likely to develop in:  People who have an increased body weight.  People who have connective tissue disorders.  People who use NSAID medicines.  What are the signs or symptoms? Symptoms of this condition include:  Heartburn.  Difficult or painful swallowing.  The feeling of having a lump in the throat.  Abitter taste in the mouth.  Bad breath.  Having a large amount of saliva.  Having an upset or bloated stomach.  Belching.  Chest pain.  Shortness of breath or wheezing.  Ongoing (chronic) cough or a night-time cough.  Wearing away of tooth enamel.  Weight loss.  Different conditions can cause chest pain. Make sure to see your health care provider if you experience chest pain. How is this diagnosed? Your health care provider will take a medical history and perform a physical exam. To determine if you have mild or severe  GERD, your health care provider may also monitor how you respond to treatment. You may also have other tests, including:  An endoscopy toexamine your stomach and esophagus with a small camera.  A test thatmeasures the acidity level in your esophagus.  A test thatmeasures how much pressure is on your esophagus.  A barium swallow or modified barium swallow to show the shape, size, and functioning of your esophagus.  How is this treated? The goal of treatment is to help relieve your symptoms and to prevent complications. Treatment for this condition may vary depending on how severe your symptoms are. Your health care provider may recommend:  Changes to your diet.  Medicine.  Surgery.  Follow these instructions at home: Diet  Follow a diet as recommended by your health care provider. This may involve avoiding foods and drinks such as: ? Coffee and tea (with or without caffeine). ? Drinks that containalcohol. ? Energy drinks and sports drinks. ? Carbonated drinks or sodas. ? Chocolate and cocoa. ? Peppermint and mint flavorings. ? Garlic and onions. ? Horseradish. ? Spicy and acidic foods, including peppers, chili powder, curry powder, vinegar, hot sauces, and barbecue sauce. ? Citrus fruit juices and citrus fruits, such as oranges, lemons, and limes. ? Tomato-based foods, such as red sauce, chili, salsa, and pizza with red sauce. ? Fried and fatty foods, such as donuts, french fries, potato chips, and high-fat dressings. ? High-fat meats, such as hot dogs and fatty cuts of red and white meats, such as rib eye steak, sausage, ham, and bacon. ? High-fat dairy items, such as whole milk,   butter, and cream cheese.  Eat small, frequent meals instead of large meals.  Avoid drinking large amounts of liquid with your meals.  Avoid eating meals during the 2-3 hours before bedtime.  Avoid lying down right after you eat.  Do not exercise right after you eat. General  instructions  Pay attention to any changes in your symptoms.  Take over-the-counter and prescription medicines only as told by your health care provider. Do not take aspirin, ibuprofen, or other NSAIDs unless your health care provider told you to do so.  Do not use any tobacco products, including cigarettes, chewing tobacco, and e-cigarettes. If you need help quitting, ask your health care provider.  Wear loose-fitting clothing. Do not wear anything tight around your waist that causes pressure on your abdomen.  Raise (elevate) the head of your bed 6 inches (15cm).  Try to reduce your stress, such as with yoga or meditation. If you need help reducing stress, ask your health care provider.  If you are overweight, reduce your weight to an amount that is healthy for you. Ask your health care provider for guidance about a safe weight loss goal.  Keep all follow-up visits as told by your health care provider. This is important. Contact a health care provider if:  You have new symptoms.  You have unexplained weight loss.  You have difficulty swallowing, or it hurts to swallow.  You have wheezing or a persistent cough.  Your symptoms do not improve with treatment.  You have a hoarse voice. Get help right away if:  You have pain in your arms, neck, jaw, teeth, or back.  You feel sweaty, dizzy, or light-headed.  You have chest pain or shortness of breath.  You vomit and your vomit looks like blood or coffee grounds.  You faint.  Your stool is bloody or black.  You cannot swallow, drink, or eat. This information is not intended to replace advice given to you by your health care provider. Make sure you discuss any questions you have with your health care provider. Document Released: 08/16/2005 Document Revised: 04/05/2016 Document Reviewed: 03/03/2015 Elsevier Interactive Patient Education  2018 Elsevier Inc.  

## 2018-01-22 NOTE — Progress Notes (Signed)
Subjective:  Patient ID: Thomas Chung, male    DOB: 31-Oct-1941  Age: 77 y.o. MRN: [id number]  CC: Gastroesophageal Reflux; Hyperlipidemia; Hypertension; and Diabetes   HPI Thomas Chung presents for f/up -He has had a few episodes of heartburn recently so his wife gave him her supply of ranitidine and he says it has helped control the symptoms.  He otherwise feels well and offers complaints.  He tells me his blood pressure and his blood sugars have been well controlled.  He tells me he spent the day yesterday with his granddaughter who is since been diagnosed with influenza A.  He wants to take a Tamiflu prophylaxis.  Outpatient Medications Prior to Visit  Medication Sig Dispense Refill  . amLODipine (NORVASC) 10 MG tablet Take 1 tablet (10 mg total) by mouth daily. 90 tablet 2  . aspirin 81 MG tablet Take 81 mg by mouth daily.      Marland Kitchen atorvastatin (LIPITOR) 40 MG tablet Take 1 tablet (40 mg total) by mouth daily. 90 tablet 1  . Benfotiamine 150 MG CAPS Take by mouth 2 (two) times daily.    . blood glucose meter kit and supplies KIT Use to test blood sugar twice daily. DX: E11.8 1 each 0  . chlorthalidone (HYGROTON) 25 MG tablet Take 25 mg every morning 90 tablet 2  . COD LIVER OIL PO Take by mouth. Take 5 ml daily    . glucose blood (COOL BLOOD GLUCOSE TEST STRIPS) test strip Use as instructed to test blood sugar twice daily. DX: E11.8 100 each 12  . losartan (COZAAR) 100 MG tablet Take 1 tablet (100 mg total) by mouth daily. 90 tablet 1  . metoprolol succinate (TOPROL-XL) 25 MG 24 hr tablet Take 2 tablets (50 mg total) by mouth daily. Take with or immediately following a meal.    . sildenafil (REVATIO) 20 MG tablet Take 4 tablets (80 mg total) by mouth daily as needed. 60 tablet 11  . TRUEPLUS LANCETS 30G MISC Use to test blood sugar twice daily. E11.8 200 each 3   No facility-administered medications prior to visit.     ROS Review of Systems  Constitutional: Negative for  appetite change, diaphoresis, fatigue and unexpected weight change.  HENT: Negative.  Negative for sinus pressure, sore throat, trouble swallowing and voice change.   Eyes: Negative.  Negative for visual disturbance.  Respiratory: Negative for cough, chest tightness, shortness of breath and wheezing.   Cardiovascular: Negative for chest pain, palpitations and leg swelling.  Gastrointestinal: Negative for abdominal pain, blood in stool, diarrhea, nausea and vomiting.  Endocrine: Negative.  Negative for polydipsia, polyphagia and polyuria.  Genitourinary: Negative.  Negative for difficulty urinating, hematuria and urgency.  Musculoskeletal: Negative.  Negative for back pain and myalgias.  Skin: Negative.  Negative for color change, pallor and rash.  Allergic/Immunologic: Negative.   Neurological: Negative.  Negative for dizziness, weakness and light-headedness.  Hematological: Negative for adenopathy. Does not bruise/bleed easily.  Psychiatric/Behavioral: Negative.     Objective:  BP 126/70 (BP Location: Left Arm, Patient Position: Sitting, Cuff Size: Large)   Pulse 72   Temp 97.7 F (36.5 C) (Oral)   Resp 16   Ht '6\' 4"'$  (1.93 m)   Wt 268 lb (121.6 kg)   SpO2 95%   BMI 32.62 kg/m   BP Readings from Last 3 Encounters:  01/22/18 126/70  01/09/18 106/68  11/07/17 126/72    Wt Readings from Last 3 Encounters:  01/22/18 268 lb (  121.6 kg)  08/07/17 269 lb (122 kg)  07/24/17 263 lb (119.3 kg)    Physical Exam  Constitutional: He is oriented to person, place, and time. No distress.  HENT:  Mouth/Throat: Oropharynx is clear and moist. No oropharyngeal exudate.  Eyes: Conjunctivae are normal. Left eye exhibits no discharge. No scleral icterus.  Neck: Normal range of motion. Neck supple. No JVD present. No thyromegaly present.  Cardiovascular: Normal rate, regular rhythm and normal heart sounds. Exam reveals no gallop.  No murmur heard. Pulmonary/Chest: Effort normal and breath  sounds normal. No respiratory distress. He has no wheezes. He has no rales.  Abdominal: Soft. Bowel sounds are normal. He exhibits no distension and no mass. There is no tenderness. There is no guarding.  Musculoskeletal: Normal range of motion. He exhibits no edema or tenderness.  Lymphadenopathy:    He has no cervical adenopathy.  Neurological: He is alert and oriented to person, place, and time.  Skin: Skin is warm and dry. No rash noted. He is not diaphoretic. No erythema. No pallor.  Vitals reviewed.   Lab Results  Component Value Date   WBC 11.4 (H) 01/22/2018   HGB 15.1 01/22/2018   HCT 45.6 01/22/2018   PLT 428.0 (H) 01/22/2018   GLUCOSE 81 01/22/2018   CHOL 114 01/22/2018   TRIG 108.0 01/22/2018   HDL 38.10 (L) 01/22/2018   LDLCALC 54 01/22/2018   ALT 18 01/22/2018   AST 17 01/22/2018   NA 136 01/22/2018   K 4.1 01/22/2018   CL 101 01/22/2018   CREATININE 1.05 01/22/2018   BUN 24 (H) 01/22/2018   CO2 28 01/22/2018   TSH 1.04 01/22/2018   PSA 0.07 (L) 01/22/2018   HGBA1C 6.4 01/22/2018   MICROALBUR 0.7 01/22/2018    Dg Chest 2 View  Result Date: 02/08/2016 CLINICAL DATA:  Cough. EXAM: CHEST  2 VIEW COMPARISON:  CT 09/06/2015.  Chest x-ray 08/26/2015 . FINDINGS: Mediastinum hilar structures normal. Lungs are clear. Cardiomegaly with normal pulmonary vascularity. No focal infiltrate. No pleural effusion or pneumothorax. Multiple right rib fractures. IMPRESSION: 1.  Cardiomegaly, no pulmonary venous congestion. 2. No focal pulmonary infiltrate. Electronically Signed   By: Marcello Moores  Register   On: 02/08/2016 08:09    Assessment & Plan:   [place] was seen today for gastroesophageal reflux, hyperlipidemia, hypertension and diabetes.  Diagnoses and all orders for this visit:  Essential hypertension, benign- His blood pressure is well controlled.  Labs are negative for any secondary causes or endorgan damage. -     Comprehensive metabolic panel; Future -     CBC with  Differential/Platelet; Future -     Urinalysis, Routine w reflex microscopic; Future -     Thyroid Panel With TSH; Future  Type 2 diabetes mellitus with complication, without long-term current use of insulin ([place])- His A1c is at 6.4%.  His blood sugars are adequately well controlled. -     Microalbumin / creatinine urine ratio; Future -     Hemoglobin A1c; Future  Hyperlipidemia with target LDL less than 100- He has achieved his LDL goal and is doing well on the statin. -     Lipid panel; Future -     Thyroid Panel With TSH; Future  Prostate nodule with urinary obstruction- His PSA remains very low which is reassuring that he does not have prostate cancer. -     PSA; Future  GERD without esophagitis- He is getting symptom relief with ranitidine.  Will continue. -  ranitidine (ZANTAC) 150 MG capsule; Take 1 capsule (150 mg total) by mouth 2 (two) times daily.  Exposure to influenza -     oseltamivir (TAMIFLU) 75 MG capsule; Take 1 capsule (75 mg total) by mouth daily for 10 days.   I am having [name] start on ranitidine and oseltamivir. I am also having him maintain his aspirin, COD LIVER OIL PO, Benfotiamine, sildenafil, losartan, atorvastatin, amLODipine, chlorthalidone, glucose blood, blood glucose meter kit and supplies, TRUEPLUS LANCETS 30G, and metoprolol succinate.  Meds ordered this encounter  Medications  . ranitidine (ZANTAC) 150 MG capsule    Sig: Take 1 capsule (150 mg total) by mouth 2 (two) times daily.    Dispense:  180 capsule    Refill:  1  . oseltamivir (TAMIFLU) 75 MG capsule    Sig: Take 1 capsule (75 mg total) by mouth daily for 10 days.    Dispense:  10 capsule    Refill:  0     Follow-up: Return in about 6 months (around 07/25/2018).  Scarlette Calico, MD

## 2018-01-23 ENCOUNTER — Encounter: Payer: Self-pay | Admitting: Internal Medicine

## 2018-01-23 LAB — THYROID PANEL WITH TSH
Free Thyroxine Index: 2.4 (ref 1.4–3.8)
T3 Uptake: 32 % (ref 22–35)
T4, Total: 7.5 ug/dL (ref 4.9–10.5)
TSH: 1.04 mIU/L (ref 0.40–4.50)

## 2018-01-28 ENCOUNTER — Ambulatory Visit (INDEPENDENT_AMBULATORY_CARE_PROVIDER_SITE_OTHER): Payer: Medicare HMO | Admitting: Family Medicine

## 2018-01-28 ENCOUNTER — Ambulatory Visit (HOSPITAL_COMMUNITY)
Admission: RE | Admit: 2018-01-28 | Discharge: 2018-01-28 | Disposition: A | Discharge status: Home / Self Care | Payer: Medicare HMO | Source: Ambulatory Visit | Attending: Family Medicine | Admitting: Family Medicine

## 2018-01-28 ENCOUNTER — Other Ambulatory Visit: Payer: Self-pay | Admitting: Family Medicine

## 2018-01-28 ENCOUNTER — Encounter: Payer: Self-pay | Admitting: Family Medicine

## 2018-01-28 VITALS — BP 130/80 | HR 77 | Temp 97.6°F | Wt 263.1 lb

## 2018-01-28 DIAGNOSIS — M7989 Other specified soft tissue disorders: Secondary | ICD-10-CM

## 2018-01-28 DIAGNOSIS — M25562 Pain in left knee: Secondary | ICD-10-CM

## 2018-01-28 DIAGNOSIS — M79662 Pain in left lower leg: Secondary | ICD-10-CM | POA: Diagnosis not present

## 2018-01-28 MED ORDER — COLCHICINE 0.6 MG PO TABS
0.6000 mg | ORAL_TABLET | Freq: Two times a day (BID) | ORAL | 2 refills | Status: DC
Start: 2018-01-28 — End: 2018-07-23

## 2018-01-28 MED ORDER — NAPROXEN 500 MG PO TABS: 500.0000 mg | ORAL_TABLET | Freq: Two times a day (BID) | ORAL | 1 refills | Status: DC

## 2018-01-28 NOTE — Patient Instructions (Signed)
Please take the stain every other day while you are taking the colchicine  We will call you with the results from today  Please follow up with me in 1-2 weeks if your pain hasn't improved.

## 2018-01-28 NOTE — Telephone Encounter (Unsigned)
Copied from [place] [id number]. Topic: Quick Communication - Rx Refill/Question >> Jan 28, 2018  4:47 PM Neva Seat wrote: Colchicine 0.6 mg 60 tab count $250.  Pt needing a new less expensive medication.  [facility] - [place], [address] AT [address] [place] [address] Phone: [phone] Fax: [phone]

## 2018-01-28 NOTE — Progress Notes (Signed)
Thomas Chung - 77 y.o. male MRN [id number]  Date of birth: [birth year]  SUBJECTIVE:  Including CC & ROS.  Chief Complaint  Patient presents with  . Acute Visit    left knee pain/ also patient has back pain     Thomas Chung is a 77 y.o. male that is  Presenting with left knee pain. The pain is anterior nature and has been going on for 2 weeks. The pain is moderate in severity. He has tried over-the-counter medications with limited improvement. He denies any injury. He denies any locking or giving way. He has had some redness and swelling. He denies any fevers. He has had lower extremity swelling below the knee. His ankle has +1 pitting edema. He denies any history of blood clots. He did take 2 tablets of aspirin. He denies any shortness of breath.e denies any pain of his gastrocnemius. He does practice tai chi but was not unable to perform that last week due to pain.   Review of Systems  Constitutional: Negative for fever.  HENT: Negative for congestion.   Respiratory: Negative for cough.   Cardiovascular: Positive for leg swelling. Negative for chest pain.  Gastrointestinal: Negative for abdominal pain.  Musculoskeletal: Positive for arthralgias. Negative for gait problem.  Skin: Negative for color change.  Allergic/Immunologic: Negative for immunocompromised state.  Neurological: Negative for weakness.  Hematological: Negative for adenopathy.  Psychiatric/Behavioral: Negative for agitation.    HISTORY: Past Medical, Surgical, Social, and Family History Reviewed & Updated per EMR.   Pertinent Historical Findings include:  Past Medical History:  Diagnosis Date  . Depression   . Fatty liver   . Glucose intolerance (impaired glucose tolerance)   . HOH (hard of hearing)    bilateral hearing aides  . Hx of colonic polyps   . Hyperlipidemia   . Hypertension   . Neuromuscular disorder ([place])    neuropathy feet    Past Surgical History:  Procedure Laterality Date  .  COLONOSCOPY  2006, 2009, 07/28/2011   2006 12 and 7 mm TVadenoma and adenoma 2009: 3 small adenomas 2012,:55m rectal polyp  . COLONOSCOPY    . ORIF FINGER FRACTURE  02/08/2012   Procedure: OPEN REDUCTION INTERNAL FIXATION (ORIF) METACARPAL (FINGER) FRACTURE;  Surgeon: RCammie Sickle, MD;  [facility]  Service: Orthopedics;  Laterality: Right;  right small finger middle phalanx  . SQUAMOUS CELL CARCINOMA EXCISION     on scalp and nose  . TIBIA FRACTURE SURGERY  2001   right with hardware  . VASECTOMY    . WRIST FRACTURE SURGERY  2002   right    Allergies  Allergen Reactions  . Prevnar [Pneumococcal 13-Val Conj Vacc]     Joint pain, swelling    Family History  Problem Relation Age of Onset  . Arrhythmia Mother   . Heart attack Mother   . Heart disease Mother   . Heart failure Father   . Diabetes Father   . Coronary artery disease Unknown        1st degree relative<60  . Alcohol abuse Unknown   . Diabetes Unknown   . Stomach cancer Sister   . Cancer Neg Hx   . Stroke Neg Hx   . Hyperlipidemia Neg Hx   . Hypertension Neg Hx   . Kidney disease Neg Hx      Social History   Socioeconomic History  . Marital status: Married    Spouse name: Not on file  .  Number of children: Not on file  . Years of education: Not on file  . Highest education level: Not on file  Social Needs  . Financial resource strain: Not on file  . Food insecurity - worry: Not on file  . Food insecurity - inability: Not on file  . Transportation needs - medical: Not on file  . Transportation needs - non-medical: Not on file  Occupational History  . Occupation: retired    Fish farm manager: RETIRED  Tobacco Use  . Smoking status: Former Smoker    Last attempt to quit: 09/21/1969    Years since quitting: 48.3  . Smokeless tobacco: Never Used  Substance and Sexual Activity  . Alcohol use: Yes    Alcohol/week: 3.6 oz    Types: 6 Cans of beer per week  . Drug use: No  . Sexual  activity: Yes  Other Topics Concern  . Not on file  Social History Narrative   No regular exercise     PHYSICAL EXAM:  VS: BP 130/80 (BP Location: Right Arm, Patient Position: Sitting, Cuff Size: Normal)   Pulse 77   Temp 97.6 F (36.4 C) (Oral)   Wt 263 lb 1.9 oz (119.4 kg)   BMI 32.03 kg/m  Physical Exam Gen: NAD, alert, cooperative with exam, well-appearing ENT: normal lips, normal nasal mucosa,  Eye: normal EOM, normal conjunctiva and lids CV: having +1 pitting edema of the left lower extremity below the knee, +2 pedal pulses   Resp: no accessory muscle use, non-labored,  Skin: no rashes, no areas of induration  Neuro: normal tone, normal sensation to touch Psych:  normal insight, alert and oriented MSK:  Left Knee:  No overlying erythema or significant effusion. No specific area of tenderness. Normal range of motion. No stability with valgus testing. Normal gait. No pain with patellar grind. Neurovascular intact.  Limited ultrasound: Left knee:  No significant effusion in the suprapatellar pouch. Mild outpouching of the medial meniscus but no significant degenerative changes on the medial or lateral joint line Normal Quadriceps tendon. The appears to be effusion at the insertion point of the patellar tendon with some hypervascularity in this area. Appears to have a double blind sign at the patellofemoral joint. This is possible for uric acid collection.  Summary:  Inflammatory changes at the insertion of the patellar tendon. Findings suggestive for possible elevation of uric acid  Ultrasound and interpretation by Clearance Coots, MD           ASSESSMENT & PLAN:   Pain of left knee and lower leg Possible for gout flare with findings with uric acid. Doubtful for infection. Doesn't appear to have significant degenerative changes.  - colchicine today  - if no improvement would consider injection and imaging. May need to check uric acid level   Leg  swelling Having unilateral leg swelling below the left knee that is painful today. Could be associated with inflammatory changes if associated with gout. Will check to rule out clot. - venous duplex today - if no suggestion of compression and elevation.

## 2018-01-28 NOTE — Assessment & Plan Note (Signed)
Possible for gout flare with findings with uric acid. Doubtful for infection. Doesn't appear to have significant degenerative changes.  - colchicine today  - if no improvement would consider injection and imaging. May need to check uric acid level

## 2018-01-28 NOTE — Telephone Encounter (Signed)
Left VM for patient. If he calls back please have him speak with a nurse/CMA and inform that we can use naproxen for gout. He should take until his symptoms have stopped and then two days being symptom free.   If any questions then please take the best time and phone number to call and I will try to call him back.   Rosemarie Ax, MD [facility] 01/28/2018, 6:04 PM

## 2018-01-28 NOTE — Assessment & Plan Note (Signed)
Having unilateral leg swelling below the left knee that is painful today. Could be associated with inflammatory changes if associated with gout. Will check to rule out clot. - venous duplex today - if no suggestion of compression and elevation.

## 2018-01-28 NOTE — Progress Notes (Signed)
VASCULAR LAB PRELIMINARY  PRELIMINARY  PRELIMINARY  PRELIMINARY  Left lower extremity venous duplex completed.    Preliminary report:  There is no DVT or SVT noted in the left lower extremity.  Thomas Chung, RVT 01/28/2018, 12:08 PM

## 2018-01-30 ENCOUNTER — Ambulatory Visit (INDEPENDENT_AMBULATORY_CARE_PROVIDER_SITE_OTHER): Payer: Medicare HMO | Admitting: Pharmacist

## 2018-01-30 DIAGNOSIS — B351 Tinea unguium: Secondary | ICD-10-CM | POA: Diagnosis not present

## 2018-01-30 DIAGNOSIS — M79675 Pain in left toe(s): Secondary | ICD-10-CM | POA: Diagnosis not present

## 2018-01-30 DIAGNOSIS — I10 Essential (primary) hypertension: Secondary | ICD-10-CM

## 2018-01-30 DIAGNOSIS — M79674 Pain in right toe(s): Secondary | ICD-10-CM | POA: Diagnosis not present

## 2018-01-30 MED ORDER — METOPROLOL SUCCINATE ER 25 MG PO TB24: 25.0000 mg | ORAL_TABLET | Freq: Every day | ORAL | 3 refills | Status: DC

## 2018-01-30 NOTE — Progress Notes (Signed)
Patient ID: Thomas Chung                 DOB: Jul 04, 1941                      MRN: [id number]     HPI: Thomas Chung is a 77 y.o. male patient of Dr. Radford Pax who presents today for hypertension follow up. PMH is significant for HLD and HTN. Pt was recently affected by valsartan recall and switched to losartan, which was less effective for BP control so chlorthalidone was added. At initial HTN clinic visit pt reported fluctuating BPs at home based upon Na intake with meals. BP remained uncontrolled so amlodipine '5mg'$  was started and he was encouraged to reduce salt intake. At his most recent visit his medications were maintained. At his most recent OV his HR and BP remained low and his Toprol was decreased to '25mg'$  daily.   He presents today for follow up. He reports that he had gout flare on Sunday. He previously has had a mild gout flare, but nothing this severe. He reports that he does feel much better otherwise since our last visit. He states his HR is running more consistently in the 60s and low 70s (only one reading in the 50s) since backing down on Toprol. His pressures have also been mostly good at home.     Current HTN meds: Toprol XL '25mg'$  daily (PM), losartan '100mg'$  daily (AM), chlorthalidone '25mg'$  daily (AM), amlodipine '10mg'$  daily (PM)  Previously Tried: valsartan (recall)  BP goal:<130/87mHg  Family History:Coronary artery disease in his unknown relative; Diabetes in his father and unknown relative; Heart attack in his mother; Heart disease in his mother; Heart failure in his father.  Social History:Former smoker, quit in 1970. Drinks ~6 beers per week. Denies illicit drug use.  Diet:Doesn't add salt to food at home but eats at restaurants frequently. Limits food purchases to < '500mg'$  sodium on nutrition labels. Drinks 3 cups of coffee each day  Exercise:Tai chi, senior aerobics, or walkingabout 4x per week  Home BP readings: 89-145/58-80 - mostly 110s/60s HR 50s and  60s  Wt Readings from Last 3 Encounters:  01/28/18 263 lb 1.9 oz (119.4 kg)  01/22/18 268 lb (121.6 kg)  08/07/17 269 lb (122 kg)   BP Readings from Last 3 Encounters:  01/30/18 124/72  01/28/18 130/80  01/22/18 126/70   Pulse Readings from Last 3 Encounters:  01/30/18 68  01/28/18 77  01/22/18 72    Renal function: Estimated Creatinine Clearance: 84.5 mL/min (by C-G formula based on SCr of 1.05 mg/dL).  Past Medical History:  Diagnosis Date  . Depression   . Fatty liver   . Glucose intolerance (impaired glucose tolerance)   . HOH (hard of hearing)    bilateral hearing aides  . Hx of colonic polyps   . Hyperlipidemia   . Hypertension   . Neuromuscular disorder ([place]    neuropathy feet    Current Outpatient Medications on File Prior to Visit  Medication Sig Dispense Refill  . amLODipine (NORVASC) 10 MG tablet Take 1 tablet (10 mg total) by mouth daily. 90 tablet 2  . aspirin 81 MG tablet Take 81 mg by mouth daily.      .Marland Kitchenatorvastatin (LIPITOR) 40 MG tablet Take 1 tablet (40 mg total) by mouth daily. 90 tablet 1  . Benfotiamine 150 MG CAPS Take by mouth 2 (two) times daily.    . blood glucose meter kit  and supplies KIT Use to test blood sugar twice daily. DX: E11.8 1 each 0  . chlorthalidone (HYGROTON) 25 MG tablet Take 25 mg every morning 90 tablet 2  . COD LIVER OIL PO Take by mouth. Take 5 ml daily    . colchicine 0.6 MG tablet Take 1 tablet (0.6 mg total) by mouth 2 (two) times daily. 60 tablet 2  . glucose blood (COOL BLOOD GLUCOSE TEST STRIPS) test strip Use as instructed to test blood sugar twice daily. DX: E11.8 100 each 12  . losartan (COZAAR) 100 MG tablet Take 1 tablet (100 mg total) by mouth daily. 90 tablet 1  . naproxen (NAPROSYN) 500 MG tablet TAKE 1 TABLET(500 MG) BY MOUTH TWICE DAILY WITH A MEAL 180 tablet 1  . oseltamivir (TAMIFLU) 75 MG capsule Take 1 capsule (75 mg total) by mouth daily for 10 days. 10 capsule 0  . ranitidine (ZANTAC) 150 MG capsule  Take 1 capsule (150 mg total) by mouth 2 (two) times daily. 180 capsule 1  . sildenafil (REVATIO) 20 MG tablet Take 4 tablets (80 mg total) by mouth daily as needed. 60 tablet 11  . TRUEPLUS LANCETS 30G MISC Use to test blood sugar twice daily. E11.8 200 each 3   No current facility-administered medications on file prior to visit.     Allergies  Allergen Reactions  . Prevnar [Pneumococcal 13-Val Conj Vacc]     Joint pain, swelling    Blood pressure 124/72, pulse 68.   Assessment/Plan: Hypertension: BP today is at goal. Will continue all medications as prescribed. With the relatively recent addition of chlorthalidone did advise to monitor for more frequent gout flares and to let us know if these persist since thiazide diuretics can increase risk for gout flares. Of note losartan is uric acid lowering and could decrease risk of gout. Since pressures are controlled and he is doing well now with decreased Toprol will have him follow up with Dr. Radford Pax as scheduled and HTN clinic as needed.     Thank you, Lelan Pons. Patterson Hammers[place], [place]

## 2018-01-30 NOTE — Patient Instructions (Addendum)
CONTINUE all medication as prescribed.   Watch gout flares and call if flares persist.   Follow up with Dr. Radford Pax as scheduled. Call if you need an additional appointment with hypertension clinic. [phone].

## 2018-02-01 ENCOUNTER — Encounter: Payer: Self-pay | Admitting: Pharmacist

## 2018-02-11 ENCOUNTER — Encounter: Payer: Self-pay | Admitting: Internal Medicine

## 2018-02-25 ENCOUNTER — Other Ambulatory Visit (INDEPENDENT_AMBULATORY_CARE_PROVIDER_SITE_OTHER): Payer: Medicare HMO

## 2018-02-25 ENCOUNTER — Ambulatory Visit (INDEPENDENT_AMBULATORY_CARE_PROVIDER_SITE_OTHER): Payer: Medicare HMO | Admitting: Internal Medicine

## 2018-02-25 ENCOUNTER — Encounter: Payer: Self-pay | Admitting: Internal Medicine

## 2018-02-25 VITALS — BP 136/74 | HR 69 | Resp 16 | Ht 76.0 in | Wt 268.0 lb

## 2018-02-25 DIAGNOSIS — I10 Essential (primary) hypertension: Secondary | ICD-10-CM

## 2018-02-25 LAB — CBC WITH DIFFERENTIAL/PLATELET
Basophils Absolute: 0.1 10*3/uL (ref 0.0–0.1)
Basophils Relative: 0.6 % (ref 0.0–3.0)
Eosinophils Absolute: 0.6 10*3/uL (ref 0.0–0.7)
Eosinophils Relative: 6.1 % — ABNORMAL HIGH (ref 0.0–5.0)
HCT: 45.2 % (ref 39.0–52.0)
Hemoglobin: 15.3 g/dL (ref 13.0–17.0)
Lymphocytes Relative: 12.1 % (ref 12.0–46.0)
Lymphs Abs: 1.1 10*3/uL (ref 0.7–4.0)
MCHC: 33.9 g/dL (ref 30.0–36.0)
MCV: 84.3 fl (ref 78.0–100.0)
Monocytes Absolute: 1.1 10*3/uL — ABNORMAL HIGH (ref 0.1–1.0)
Monocytes Relative: 11.3 % (ref 3.0–12.0)
Neutro Abs: 6.6 10*3/uL (ref 1.4–7.7)
Neutrophils Relative %: 69.9 % (ref 43.0–77.0)
Platelets: 407 10*3/uL — ABNORMAL HIGH (ref 150.0–400.0)
RBC: 5.36 Mil/uL (ref 4.22–5.81)
RDW: 14.5 % (ref 11.5–15.5)
WBC: 9.4 10*3/uL (ref 4.0–10.5)

## 2018-02-25 NOTE — Progress Notes (Signed)
Subjective:  Patient ID: Thomas Chung, male    DOB: 1941/01/30  Age: 77 y.o. MRN: [id number]  CC: Hypertension   HPI [name] presents for f/up - He was recently seen by someone else for left knee pain and worsening swelling in both lower extremities, worse on the left than the right.  He was diagnosed and treated for gout in the left knee.  The pain and swelling in the left knee has resolved.  The edema in the lower extremities has not.  An ultrasound was negative for DVT.  The edema continues to bother him.  He also recently saw someone else and his dose of amlodipine has been increased in an effort to try to control his blood pressure.  He tells me at home his blood pressure has been well controlled.  Outpatient Medications Prior to Visit  Medication Sig Dispense Refill  . aspirin 81 MG tablet Take 81 mg by mouth daily.      Marland Kitchen atorvastatin (LIPITOR) 40 MG tablet Take 1 tablet (40 mg total) by mouth daily. 90 tablet 1  . Benfotiamine 150 MG CAPS Take by mouth 2 (two) times daily.    . blood glucose meter kit and supplies KIT Use to test blood sugar twice daily. DX: E11.8 1 each 0  . chlorthalidone (HYGROTON) 25 MG tablet Take 25 mg every morning 90 tablet 2  . COD LIVER OIL PO Take by mouth. Take 5 ml daily    . colchicine 0.6 MG tablet Take 1 tablet (0.6 mg total) by mouth 2 (two) times daily. 60 tablet 2  . glucose blood (COOL BLOOD GLUCOSE TEST STRIPS) test strip Use as instructed to test blood sugar twice daily. DX: E11.8 100 each 12  . losartan (COZAAR) 100 MG tablet Take 1 tablet (100 mg total) by mouth daily. 90 tablet 1  . metoprolol succinate (TOPROL-XL) 25 MG 24 hr tablet Take 1 tablet (25 mg total) by mouth daily. Take with or immediately following a meal. 90 tablet 3  . ranitidine (ZANTAC) 150 MG capsule Take 1 capsule (150 mg total) by mouth 2 (two) times daily. 180 capsule 1  . sildenafil (REVATIO) 20 MG tablet Take 4 tablets (80 mg total) by mouth daily as needed.  60 tablet 11  . TRUEPLUS LANCETS 30G MISC Use to test blood sugar twice daily. E11.8 200 each 3  . amLODipine (NORVASC) 10 MG tablet Take 1 tablet (10 mg total) by mouth daily. 90 tablet 2  . naproxen (NAPROSYN) 500 MG tablet TAKE 1 TABLET(500 MG) BY MOUTH TWICE DAILY WITH A MEAL 180 tablet 1   No facility-administered medications prior to visit.     ROS Review of Systems  Constitutional: Negative for appetite change, diaphoresis, fatigue and unexpected weight change.  HENT: Negative.   Eyes: Negative for visual disturbance.  Respiratory: Negative for cough, chest tightness, shortness of breath and wheezing.   Cardiovascular: Positive for leg swelling. Negative for chest pain and palpitations.  Gastrointestinal: Negative for abdominal pain, constipation, diarrhea, nausea and vomiting.  Endocrine: Negative.  Negative for cold intolerance and heat intolerance.  Genitourinary: Negative.  Negative for difficulty urinating, dysuria and urgency.  Musculoskeletal: Negative for arthralgias, back pain, myalgias and neck pain.  Skin: Negative.  Negative for color change, pallor and rash.  Neurological: Negative.  Negative for dizziness, weakness and light-headedness.  Hematological: Negative for adenopathy. Does not bruise/bleed easily.  Psychiatric/Behavioral: Negative.     Objective:  BP 136/74 (BP Location: Left  Arm, Patient Position: Sitting, Cuff Size: Large)   Pulse 69   Resp 16   Ht _0  (1.93 m)   Wt 268 lb (121.6 kg)   SpO2 98%   BMI 32.62 kg/m   BP Readings from Last 3 Encounters:  02/25/18 136/74  01/30/18 124/72  01/28/18 130/80    Wt Readings from Last 3 Encounters:  02/25/18 268 lb (121.6 kg)  01/28/18 263 lb 1.9 oz (119.4 kg)  01/22/18 268 lb (121.6 kg)    Physical Exam  Constitutional: He is oriented to person, place, and time. He appears well-nourished. No distress.  HENT:  Mouth/Throat: No oropharyngeal exudate.  Neck: Normal range of motion. Neck supple.  No JVD present. No thyromegaly present.  Cardiovascular: Normal rate, regular rhythm and normal heart sounds. Exam reveals no gallop and no friction rub.  No murmur heard. Pulmonary/Chest: Effort normal and breath sounds normal. No stridor. No respiratory distress. He has no wheezes. He has no rales.  Abdominal: Bowel sounds are normal. He exhibits no distension and no mass. There is no tenderness. No hernia.  Musculoskeletal: Normal range of motion. He exhibits edema. He exhibits no tenderness or deformity.       Left knee: Normal. He exhibits normal range of motion, no swelling, no effusion, no ecchymosis, no deformity and no erythema.  2+ pitting edema left lower extremity, 1+ pitting edema right lower extremity  Lymphadenopathy:    He has no cervical adenopathy.  Neurological: He is alert and oriented to person, place, and time.  Skin: Skin is warm and dry. He is not diaphoretic.    Lab Results  Component Value Date   WBC 9.4 02/25/2018   HGB 15.3 02/25/2018   HCT 45.2 02/25/2018   PLT 407.0 (H) 02/25/2018   GLUCOSE 81 01/22/2018   CHOL 114 01/22/2018   TRIG 108.0 01/22/2018   HDL 38.10 (L) 01/22/2018   LDLCALC 54 01/22/2018   ALT 18 01/22/2018   AST 17 01/22/2018   NA 136 01/22/2018   K 4.1 01/22/2018   CL 101 01/22/2018   CREATININE 1.05 01/22/2018   BUN 24 (H) 01/22/2018   CO2 28 01/22/2018   TSH 1.04 01/22/2018   PSA 0.07 (L) 01/22/2018   HGBA1C 6.4 01/22/2018   MICROALBUR 0.7 01/22/2018    No results found.  Assessment & Plan:   Kaito was seen today for hypertension.  Diagnoses and all orders for this visit:  Essential hypertension, benign- His CBC is normal now.  Since he complains of pitting edema I have asked him to stop taking the CCB.  Will continue to control his blood pressure with the ARB, thiazide diuretic, and beta-blocker. -     CBC with Differential/Platelet; Future   I have discontinued Thayer Jew A. Gonet's amLODipine and naproxen. I am also  having him maintain his aspirin, COD LIVER OIL PO, Benfotiamine, sildenafil, losartan, atorvastatin, chlorthalidone, glucose blood, blood glucose meter kit and supplies, TRUEPLUS LANCETS 30G, ranitidine, colchicine, and metoprolol succinate.  No orders of the defined types were placed in this encounter.    Follow-up: Return in about 6 months (around 08/27/2018).  Scarlette Calico, MD

## 2018-02-25 NOTE — Patient Instructions (Signed)

## 2018-02-27 ENCOUNTER — Other Ambulatory Visit: Payer: Self-pay | Admitting: Internal Medicine

## 2018-03-22 DIAGNOSIS — Z85828 Personal history of other malignant neoplasm of skin: Secondary | ICD-10-CM | POA: Diagnosis not present

## 2018-03-22 DIAGNOSIS — L918 Other hypertrophic disorders of the skin: Secondary | ICD-10-CM | POA: Diagnosis not present

## 2018-03-22 DIAGNOSIS — D485 Neoplasm of uncertain behavior of skin: Secondary | ICD-10-CM | POA: Diagnosis not present

## 2018-03-22 DIAGNOSIS — D0359 Melanoma in situ of other part of trunk: Secondary | ICD-10-CM | POA: Diagnosis not present

## 2018-03-22 DIAGNOSIS — L812 Freckles: Secondary | ICD-10-CM | POA: Diagnosis not present

## 2018-03-22 DIAGNOSIS — L821 Other seborrheic keratosis: Secondary | ICD-10-CM | POA: Diagnosis not present

## 2018-03-28 ENCOUNTER — Encounter: Payer: Self-pay | Admitting: Internal Medicine

## 2018-04-04 ENCOUNTER — Telehealth: Payer: Self-pay

## 2018-04-04 NOTE — Telephone Encounter (Signed)
Due to cost, pt is request cialis 5 mg rx #90 sig: 1 tab daily.   Please advise if change is appropriate

## 2018-04-08 ENCOUNTER — Other Ambulatory Visit: Payer: Self-pay | Admitting: Internal Medicine

## 2018-04-08 DIAGNOSIS — N403 Nodular prostate with lower urinary tract symptoms: Principal | ICD-10-CM

## 2018-04-08 DIAGNOSIS — E1169 Type 2 diabetes mellitus with other specified complication: Secondary | ICD-10-CM

## 2018-04-08 DIAGNOSIS — N521 Erectile dysfunction due to diseases classified elsewhere: Secondary | ICD-10-CM

## 2018-04-08 DIAGNOSIS — N138 Other obstructive and reflux uropathy: Secondary | ICD-10-CM

## 2018-04-08 MED ORDER — TADALAFIL 5 MG PO TABS: 5.0000 mg | ORAL_TABLET | Freq: Every day | ORAL | 1 refills | Status: DC | PRN

## 2018-04-11 DIAGNOSIS — Z85828 Personal history of other malignant neoplasm of skin: Secondary | ICD-10-CM | POA: Diagnosis not present

## 2018-04-11 DIAGNOSIS — D0359 Melanoma in situ of other part of trunk: Secondary | ICD-10-CM | POA: Diagnosis not present

## 2018-04-11 NOTE — Telephone Encounter (Signed)
Left detailed message for pt hat rx rq was sent to the pharmacy.

## 2018-04-16 ENCOUNTER — Other Ambulatory Visit: Payer: Self-pay | Admitting: Internal Medicine

## 2018-04-16 DIAGNOSIS — E785 Hyperlipidemia, unspecified: Secondary | ICD-10-CM

## 2018-04-29 ENCOUNTER — Other Ambulatory Visit: Payer: Self-pay | Admitting: Family Medicine

## 2018-05-01 ENCOUNTER — Other Ambulatory Visit: Payer: Self-pay | Admitting: Internal Medicine

## 2018-05-01 DIAGNOSIS — E118 Type 2 diabetes mellitus with unspecified complications: Secondary | ICD-10-CM

## 2018-05-01 DIAGNOSIS — I10 Essential (primary) hypertension: Secondary | ICD-10-CM

## 2018-05-15 DIAGNOSIS — L97511 Non-pressure chronic ulcer of other part of right foot limited to breakdown of skin: Secondary | ICD-10-CM | POA: Diagnosis not present

## 2018-05-15 DIAGNOSIS — M12271 Villonodular synovitis (pigmented), right ankle and foot: Secondary | ICD-10-CM | POA: Diagnosis not present

## 2018-06-04 DIAGNOSIS — L603 Nail dystrophy: Secondary | ICD-10-CM | POA: Diagnosis not present

## 2018-06-04 DIAGNOSIS — L84 Corns and callosities: Secondary | ICD-10-CM | POA: Diagnosis not present

## 2018-06-04 DIAGNOSIS — E1151 Type 2 diabetes mellitus with diabetic peripheral angiopathy without gangrene: Secondary | ICD-10-CM | POA: Diagnosis not present

## 2018-06-04 DIAGNOSIS — I739 Peripheral vascular disease, unspecified: Secondary | ICD-10-CM | POA: Diagnosis not present

## 2018-06-11 ENCOUNTER — Other Ambulatory Visit: Payer: Self-pay

## 2018-06-11 ENCOUNTER — Telehealth: Payer: Self-pay | Admitting: Internal Medicine

## 2018-06-11 MED ORDER — GLUCOSE BLOOD VI STRP: ORAL_STRIP | 3 refills | Status: DC

## 2018-06-11 NOTE — Telephone Encounter (Unsigned)
Copied from [place] [id number]. Topic: Quick Communication - Rx Refill/Question >> Jun 11, 2018  3:43 PM Neva Seat wrote: [facility] - Aviva plus test strips  Requesting refills  [place] [place] [address] Phone: [phone] Fax: [phone]

## 2018-07-23 ENCOUNTER — Encounter: Payer: Self-pay | Admitting: Internal Medicine

## 2018-07-23 ENCOUNTER — Ambulatory Visit (INDEPENDENT_AMBULATORY_CARE_PROVIDER_SITE_OTHER): Payer: Medicare HMO | Admitting: Internal Medicine

## 2018-07-23 ENCOUNTER — Other Ambulatory Visit (INDEPENDENT_AMBULATORY_CARE_PROVIDER_SITE_OTHER): Payer: Medicare HMO

## 2018-07-23 VITALS — BP 152/94 | HR 78 | Temp 97.7°F | Resp 16 | Ht 76.0 in | Wt 268.0 lb

## 2018-07-23 DIAGNOSIS — I10 Essential (primary) hypertension: Secondary | ICD-10-CM

## 2018-07-23 DIAGNOSIS — E118 Type 2 diabetes mellitus with unspecified complications: Secondary | ICD-10-CM

## 2018-07-23 DIAGNOSIS — Z23 Encounter for immunization: Secondary | ICD-10-CM

## 2018-07-23 LAB — HEMOGLOBIN A1C: Hgb A1c MFr Bld: 6.8 % — ABNORMAL HIGH (ref 4.6–6.5)

## 2018-07-23 LAB — BASIC METABOLIC PANEL
BUN: 27 mg/dL — ABNORMAL HIGH (ref 6–23)
CO2: 26 mEq/L (ref 19–32)
Calcium: 10.2 mg/dL (ref 8.4–10.5)
Chloride: 101 mEq/L (ref 96–112)
Creatinine, Ser: 1.24 mg/dL (ref 0.40–1.50)
GFR: 60.14 mL/min (ref >60.00)
Glucose, Bld: 110 mg/dL — ABNORMAL HIGH (ref 70–99)
Potassium: 4.4 mEq/L (ref 3.5–5.1)
Sodium: 137 mEq/L (ref 135–145)

## 2018-07-23 LAB — VITAMIN D 25 HYDROXY (VIT D DEFICIENCY, FRACTURES): VITD: 44.23 ng/mL (ref 30.00–100.00)

## 2018-07-23 MED ORDER — AZILSARTAN-CHLORTHALIDONE 40-25 MG PO TABS: 1.0000 | ORAL_TABLET | Freq: Every day | ORAL | 0 refills | Status: DC

## 2018-07-23 NOTE — Patient Instructions (Signed)

## 2018-07-23 NOTE — Progress Notes (Signed)
Subjective:  Patient ID: Thomas Chung, male    DOB: 1941-01-29  Age: 77 y.o. MRN: [id number]  CC: Hypertension and Diabetes   HPI Thomas Chung presents for f/up - He is concerned that his blood pressure is not adequately well controlled.  He is certain that he is taking the ARB but he is not certain that he is taking the diuretic.  He walks at least 10,000 steps every day and denies any recent episodes of CP, DOE, palpitations, edema, or fatigue.  He also tells me his blood sugars have been well controlled.  Outpatient Medications Prior to Visit  Medication Sig Dispense Refill  . Alcohol Swabs (B-D SINGLE USE SWABS REGULAR) PADS Inject 1 Act into the skin See admin instructions. 300 each 2  . atorvastatin (LIPITOR) 40 MG tablet TAKE 1 TABLET (40 MG TOTAL) BY MOUTH DAILY. 90 tablet 1  . Benfotiamine 150 MG CAPS Take by mouth 2 (two) times daily.    . blood glucose meter kit and supplies KIT Use to test blood sugar twice daily. DX: E11.8 1 each 0  . glucose blood (COOL BLOOD GLUCOSE TEST STRIPS) test strip Use as instructed to test blood sugar daily. DX: E11.8 100 each 3  . metoprolol succinate (TOPROL-XL) 25 MG 24 hr tablet Take 1 tablet (25 mg total) by mouth daily. Take with or immediately following a meal. 90 tablet 3  . ranitidine (ZANTAC) 150 MG capsule Take 1 capsule (150 mg total) by mouth 2 (two) times daily. 180 capsule 1  . tadalafil (CIALIS) 5 MG tablet Take 1 tablet (5 mg total) by mouth daily as needed for erectile dysfunction. 90 tablet 1  . TRUEPLUS LANCETS 30G MISC Use to test blood sugar twice daily. E11.8 200 each 3  . aspirin 81 MG tablet Take 81 mg by mouth daily.      . chlorthalidone (HYGROTON) 25 MG tablet Take 25 mg every morning 90 tablet 2  . losartan (COZAAR) 100 MG tablet TAKE 1 TABLET EVERY DAY 90 tablet 1  . colchicine 0.6 MG tablet Take 1 tablet (0.6 mg total) by mouth 2 (two) times daily. 60 tablet 2   No facility-administered medications prior to  visit.     ROS Review of Systems  Constitutional: Negative for diaphoresis, fatigue and unexpected weight change.  HENT: Negative.   Eyes: Negative.  Negative for visual disturbance.  Respiratory: Negative.  Negative for cough, chest tightness, shortness of breath and wheezing.   Cardiovascular: Negative for chest pain, palpitations and leg swelling.  Gastrointestinal: Negative for abdominal pain, constipation, diarrhea, nausea and vomiting.  Endocrine: Negative for polydipsia, polyphagia and polyuria.  Genitourinary: Negative.  Negative for difficulty urinating.  Musculoskeletal: Negative.  Negative for arthralgias and myalgias.  Skin: Negative.   Neurological: Negative.  Negative for dizziness, weakness, light-headedness and headaches.  Hematological: Negative for adenopathy. Does not bruise/bleed easily.  Psychiatric/Behavioral: Negative.     Objective:  BP (!) 152/94   Pulse 78   Temp 97.7 F (36.5 C) (Oral)   Resp 16   Ht _0  (1.93 m)   Wt 268 lb (121.6 kg)   SpO2 93%   BMI 32.62 kg/m   BP Readings from Last 3 Encounters:  07/23/18 (!) 152/94  02/25/18 136/74  01/30/18 124/72    Wt Readings from Last 3 Encounters:  07/23/18 268 lb (121.6 kg)  02/25/18 268 lb (121.6 kg)  01/28/18 263 lb 1.9 oz (119.4 kg)    Physical Exam  Constitutional: He is oriented to person, place, and time. No distress.  HENT:  Mouth/Throat: Oropharynx is clear and moist. No oropharyngeal exudate.  Eyes: Conjunctivae are normal. No scleral icterus.  Neck: Normal range of motion. Neck supple. No JVD present. No thyromegaly present.  Cardiovascular: Normal rate, regular rhythm and normal heart sounds. Exam reveals no gallop.  No murmur heard. Pulmonary/Chest: Effort normal and breath sounds normal. No respiratory distress. He has no wheezes. He has no rales.  Abdominal: Soft. Normal appearance and bowel sounds are normal. There is no hepatosplenomegaly. There is no tenderness.    Musculoskeletal: Normal range of motion. He exhibits no edema, tenderness or deformity.  Lymphadenopathy:    He has no cervical adenopathy.  Neurological: He is alert and oriented to person, place, and time.  Skin: Skin is warm and dry. No rash noted. He is not diaphoretic.  Vitals reviewed.   Lab Results  Component Value Date   WBC 9.4 02/25/2018   HGB 15.3 02/25/2018   HCT 45.2 02/25/2018   PLT 407.0 (H) 02/25/2018   GLUCOSE 110 (H) 07/23/2018   CHOL 114 01/22/2018   TRIG 108.0 01/22/2018   HDL 38.10 (L) 01/22/2018   LDLCALC 54 01/22/2018   ALT 18 01/22/2018   AST 17 01/22/2018   NA 137 07/23/2018   K 4.4 07/23/2018   CL 101 07/23/2018   CREATININE 1.24 07/23/2018   BUN 27 (H) 07/23/2018   CO2 26 07/23/2018   TSH 1.04 01/22/2018   PSA 0.07 (L) 01/22/2018   HGBA1C 6.8 (H) 07/23/2018   MICROALBUR 0.7 01/22/2018    No results found.  Assessment & Plan:   Thomas Chung was seen today for hypertension and diabetes.  Diagnoses and all orders for this visit:  Need for influenza vaccination -     Flu vaccine HIGH DOSE PF (Fluzone High Dose)  Essential hypertension, benign- His blood pressure is not adequately well controlled.  It sounds like he is not taking the thiazide diuretic.  I will combine the ARB and the thiazide diuretic into 1 tablet to improve compliance and to achieve better blood pressure control.  His electrolytes and renal function are normal. -     Basic metabolic panel; Future -     VITAMIN D 25 Hydroxy (Vit-D Deficiency, Fractures); Future -     Azilsartan-Chlorthalidone (EDARBYCLOR) 40-25 MG TABS; Take 1 tablet by mouth daily.  Type 2 diabetes mellitus with complication, without long-term current use of insulin ([place])- His A1c is at 6.8%.  His blood sugars are well controlled. -     Basic metabolic panel; Future -     Hemoglobin A1c; Future -     HM Diabetes Foot Exam -     Azilsartan-Chlorthalidone (EDARBYCLOR) 40-25 MG TABS; Take 1 tablet by mouth  daily.   I have discontinued Thomas Chung's aspirin, chlorthalidone, colchicine, and losartan. I am also having him start on Azilsartan-Chlorthalidone. Additionally, I am having him maintain his Benfotiamine, blood glucose meter kit and supplies, TRUEPLUS LANCETS 30G, ranitidine, metoprolol succinate, B-D SINGLE USE SWABS REGULAR, tadalafil, atorvastatin, and glucose blood.  Meds ordered this encounter  Medications  . Azilsartan-Chlorthalidone (EDARBYCLOR) 40-25 MG TABS    Sig: Take 1 tablet by mouth daily.    Dispense:  70 tablet    Refill:  0     Follow-up: Return in about 3 months (around 10/22/2018).  Scarlette Calico, MD

## 2018-07-24 ENCOUNTER — Encounter: Payer: Self-pay | Admitting: Internal Medicine

## 2018-08-20 ENCOUNTER — Encounter: Payer: Self-pay | Admitting: Cardiology

## 2018-08-20 ENCOUNTER — Ambulatory Visit: Payer: Medicare HMO | Admitting: Cardiology

## 2018-08-20 VITALS — BP 114/58 | HR 75 | Ht 76.0 in | Wt 261.8 lb

## 2018-08-20 DIAGNOSIS — E118 Type 2 diabetes mellitus with unspecified complications: Secondary | ICD-10-CM | POA: Diagnosis not present

## 2018-08-20 DIAGNOSIS — I10 Essential (primary) hypertension: Secondary | ICD-10-CM | POA: Diagnosis not present

## 2018-08-20 DIAGNOSIS — E785 Hyperlipidemia, unspecified: Secondary | ICD-10-CM | POA: Diagnosis not present

## 2018-08-20 NOTE — Patient Instructions (Signed)
Medication Instructions: Your physician recommends that you continue on your current medications as directed. Please refer to the Current Medication list given to you today.   Follow-Up: As needed   If you need a refill on your cardiac medications before your next appointment, please call your pharmacy.   

## 2018-08-20 NOTE — Progress Notes (Signed)
Cardiology Office Note:    Date:  08/20/2018   ID:  Thomas Chung, DOB 07/13/1941, MRN [id number]  PCP:  Janith Lima, MD  Cardiologist:  Fransico Him, MD    Referring MD: Janith Lima, MD   Chief Complaint  Patient presents with  . Hypertension  . Hyperlipidemia    History of Present Illness:    Thomas Chung is a 77 y.o. male with a hx of HTN and dyslipidemia.  He is here today for followup and is doing well.  He denies any chest pain or pressure, SOB, DOE, PND, orthopnea, LE edema, dizziness, palpitations or syncope. He is compliant with his meds and is tolerating meds with no SE.    Past Medical History:  Diagnosis Date  . Depression   . Fatty liver   . Glucose intolerance (impaired glucose tolerance)   . HOH (hard of hearing)    bilateral hearing aides  . Hx of colonic polyps   . Hyperlipidemia   . Hypertension   . Neuromuscular disorder ([place])    neuropathy feet    Past Surgical History:  Procedure Laterality Date  . COLONOSCOPY  2006, 2009, 07/28/2011   2006 12 and 7 mm TVadenoma and adenoma 2009: 3 small adenomas 2012,:16m rectal polyp  . COLONOSCOPY    . ORIF FINGER FRACTURE  02/08/2012   Procedure: OPEN REDUCTION INTERNAL FIXATION (ORIF) METACARPAL (FINGER) FRACTURE;  Surgeon: RCammie Sickle, MD;  [facility]  Service: Orthopedics;  Laterality: Right;  right small finger middle phalanx  . SQUAMOUS CELL CARCINOMA EXCISION     on scalp and nose  . TIBIA FRACTURE SURGERY  2001   right with hardware  . VASECTOMY    . WRIST FRACTURE SURGERY  2002   right    Current Medications: Current Meds  Medication Sig  . Alcohol Swabs (B-D SINGLE USE SWABS REGULAR) PADS Inject 1 Act into the skin See admin instructions.  .Marland Kitchenatorvastatin (LIPITOR) 40 MG tablet TAKE 1 TABLET (40 MG TOTAL) BY MOUTH DAILY.  .Marland KitchenAzilsartan-Chlorthalidone (EDARBYCLOR) 40-25 MG TABS Take 1 tablet by mouth daily.  . blood glucose meter kit and supplies KIT  Use to test blood sugar twice daily. DX: E11.8  . glucose blood (COOL BLOOD GLUCOSE TEST STRIPS) test strip Use as instructed to test blood sugar daily. DX: E11.8  . metoprolol succinate (TOPROL-XL) 25 MG 24 hr tablet Take 1 tablet (25 mg total) by mouth daily. Take with or immediately following a meal.  . ranitidine (ZANTAC) 150 MG capsule Take 1 capsule (150 mg total) by mouth 2 (two) times daily.  . tadalafil (CIALIS) 5 MG tablet Take 1 tablet (5 mg total) by mouth daily as needed for erectile dysfunction.  . TRUEPLUS LANCETS 30G MISC Use to test blood sugar twice daily. E11.8     Allergies:   Amlodipine and Prevnar [pneumococcal 13-val conj vacc]   Social History   Socioeconomic History  . Marital status: Married    Spouse name: Not on file  . Number of children: Not on file  . Years of education: Not on file  . Highest education level: Not on file  Occupational History  . Occupation: retired    [employer] farm [employer] RETIRED  Social Needs  . Financial resource strain: Not on file  . Food insecurity:    Worry: Not on file    Inability: Not on file  . Transportation needs:    Medical: Not on file  Non-medical: Not on file  Tobacco Use  . Smoking status: Former Smoker    Last attempt to quit: 09/21/1969    Years since quitting: 48.9  . Smokeless tobacco: Never Used  Substance and Sexual Activity  . Alcohol use: Yes    Alcohol/week: 6.0 standard drinks    Types: 6 Cans of beer per week  . Drug use: No  . Sexual activity: Yes  Lifestyle  . Physical activity:    Days per week: Not on file    Minutes per session: Not on file  . Stress: Not on file  Relationships  . Social connections:    Talks on phone: Not on file    Gets together: Not on file    Attends religious service: Not on file    Active member of club or organization: Not on file    Attends meetings of clubs or organizations: Not on file    Relationship status: Not on file  Other Topics Concern  . Not on file  Social  History Narrative   No regular exercise     Family History: The patient's family history includes Alcohol abuse in his unknown relative; Arrhythmia in his mother; Coronary artery disease in his unknown relative; Diabetes in his father and unknown relative; Heart attack in his mother; Heart disease in his mother; Heart failure in his father; Stomach cancer in his sister. There is no history of Cancer, Stroke, Hyperlipidemia, Hypertension, or Kidney disease.  ROS:   Please see the history of present illness.    ROS  All other systems reviewed and negative.    EKGs/Labs/Other Studies Reviewed:    The following studies were reviewed today: none  EKG:  EKG is  ordered today.  The ekg ordered today demonstrates Normal sinus rhythm at 75 bpm with no ST changes.  Recent Labs: 01/22/2018: ALT 18; TSH 1.04 02/25/2018: Hemoglobin 15.3; Platelets 407.0 07/23/2018: BUN 27; Creatinine, Ser 1.24; Potassium 4.4; Sodium 137   Recent Lipid Panel    Component Value Date/Time   CHOL 114 01/22/2018 1435   TRIG 108.0 01/22/2018 1435   HDL 38.10 (L) 01/22/2018 1435   CHOLHDL 3 01/22/2018 1435   VLDL 21.6 01/22/2018 1435   LDLCALC 54 01/22/2018 1435    Physical Exam:    VS:  BP (!) 114/58   Pulse 75   Ht _0  (1.93 m)   Wt 261 lb 12.8 oz (118.8 kg)   BMI 31.87 kg/m     Wt Readings from Last 3 Encounters:  08/20/18 261 lb 12.8 oz (118.8 kg)  07/23/18 268 lb (121.6 kg)  02/25/18 268 lb (121.6 kg)     GEN:  Well nourished, well developed in no acute distress HEENT: Normal NECK: No JVD; No carotid bruits LYMPHATICS: No lymphadenopathy CARDIAC: RRR, no murmurs, rubs, gallops RESPIRATORY:  Clear to auscultation without rales, wheezing or rhonchi  ABDOMEN: Soft, non-tender, non-distended MUSCULOSKELETAL:  No edema; No deformity  SKIN: Warm and dry NEUROLOGIC:  Alert and oriented x 3 PSYCHIATRIC:  Normal affect   ASSESSMENT:    1. Essential hypertension, benign   2. Type II diabetes  mellitus with manifestations ([place])   3. Hyperlipidemia with target LDL less than 100    PLAN:    In order of problems listed above:  1.  HTN - BP is well controlled on exam today.  He will continue on Azilsartan-chlorthalidone 40-25 mg daily and Toprol-XL 25 mg daily.  His creatinine was stable at 1.24 potassium 4.4 on  07/23/2018.  2.  Type 2 DM - followed by PCP.  3.  Hyperlipidemia with LDL goal < 100.  His LDL was 54 and ALT 18 on 01/22/2018.  He will continue Lipitor 40 mg daily.  His lipids and blood pressure are well under control and I have recommended that he follow-up with me on a as needed basis and he can follow-up with his PCP going forward.   Medication Adjustments/Labs and Tests Ordered: Current medicines are reviewed at length with the patient today.  Concerns regarding medicines are outlined above.  Orders Placed This Encounter  Procedures  . EKG 12-Lead   No orders of the defined types were placed in this encounter.   Signed, Fransico Him, MD  08/20/2018 3:29 PM    [place]

## 2018-08-25 ENCOUNTER — Other Ambulatory Visit: Payer: Self-pay | Admitting: Internal Medicine

## 2018-08-25 DIAGNOSIS — N521 Erectile dysfunction due to diseases classified elsewhere: Principal | ICD-10-CM

## 2018-08-25 DIAGNOSIS — E118 Type 2 diabetes mellitus with unspecified complications: Secondary | ICD-10-CM

## 2018-08-25 DIAGNOSIS — E1169 Type 2 diabetes mellitus with other specified complication: Secondary | ICD-10-CM

## 2018-08-25 MED ORDER — TRUE METRIX AIR GLUCOSE METER W/DEVICE KIT: 1.0000 | PACK | Freq: Two times a day (BID) | 3 refills | Status: DC

## 2018-08-26 ENCOUNTER — Encounter: Payer: Self-pay | Admitting: Internal Medicine

## 2018-08-26 DIAGNOSIS — E118 Type 2 diabetes mellitus with unspecified complications: Secondary | ICD-10-CM

## 2018-08-27 MED ORDER — TRUEPLUS LANCETS 30G MISC: 3 refills | Status: DC

## 2018-08-27 MED ORDER — GLUCOSE BLOOD VI STRP: ORAL_STRIP | 3 refills | Status: DC

## 2018-09-13 ENCOUNTER — Encounter: Payer: Self-pay | Admitting: Internal Medicine

## 2018-09-16 ENCOUNTER — Other Ambulatory Visit: Payer: Self-pay | Admitting: Internal Medicine

## 2018-09-16 DIAGNOSIS — I10 Essential (primary) hypertension: Secondary | ICD-10-CM

## 2018-09-16 DIAGNOSIS — E118 Type 2 diabetes mellitus with unspecified complications: Secondary | ICD-10-CM

## 2018-09-16 MED ORDER — LOSARTAN POTASSIUM 100 MG PO TABS: 100.0000 mg | ORAL_TABLET | Freq: Every day | ORAL | 1 refills | Status: DC

## 2018-09-16 MED ORDER — CHLORTHALIDONE 25 MG PO TABS: 25.0000 mg | ORAL_TABLET | Freq: Every day | ORAL | 1 refills | Status: DC

## 2018-09-18 ENCOUNTER — Other Ambulatory Visit: Payer: Self-pay | Admitting: Internal Medicine

## 2018-09-18 DIAGNOSIS — E785 Hyperlipidemia, unspecified: Secondary | ICD-10-CM

## 2018-09-27 DIAGNOSIS — L821 Other seborrheic keratosis: Secondary | ICD-10-CM | POA: Diagnosis not present

## 2018-09-27 DIAGNOSIS — L239 Allergic contact dermatitis, unspecified cause: Secondary | ICD-10-CM | POA: Diagnosis not present

## 2018-09-27 DIAGNOSIS — L812 Freckles: Secondary | ICD-10-CM | POA: Diagnosis not present

## 2018-09-27 DIAGNOSIS — Z85828 Personal history of other malignant neoplasm of skin: Secondary | ICD-10-CM | POA: Diagnosis not present

## 2018-09-27 DIAGNOSIS — L918 Other hypertrophic disorders of the skin: Secondary | ICD-10-CM | POA: Diagnosis not present

## 2018-09-27 DIAGNOSIS — D485 Neoplasm of uncertain behavior of skin: Secondary | ICD-10-CM | POA: Diagnosis not present

## 2018-09-27 DIAGNOSIS — L820 Inflamed seborrheic keratosis: Secondary | ICD-10-CM | POA: Diagnosis not present

## 2018-09-27 DIAGNOSIS — L570 Actinic keratosis: Secondary | ICD-10-CM | POA: Diagnosis not present

## 2018-10-04 DIAGNOSIS — L84 Corns and callosities: Secondary | ICD-10-CM | POA: Diagnosis not present

## 2018-10-04 DIAGNOSIS — L603 Nail dystrophy: Secondary | ICD-10-CM | POA: Diagnosis not present

## 2018-10-04 DIAGNOSIS — I739 Peripheral vascular disease, unspecified: Secondary | ICD-10-CM | POA: Diagnosis not present

## 2018-10-04 DIAGNOSIS — E1151 Type 2 diabetes mellitus with diabetic peripheral angiopathy without gangrene: Secondary | ICD-10-CM | POA: Diagnosis not present

## 2018-10-29 ENCOUNTER — Encounter: Payer: Self-pay | Admitting: Internal Medicine

## 2018-10-29 ENCOUNTER — Other Ambulatory Visit (INDEPENDENT_AMBULATORY_CARE_PROVIDER_SITE_OTHER): Payer: Medicare HMO

## 2018-10-29 ENCOUNTER — Ambulatory Visit (INDEPENDENT_AMBULATORY_CARE_PROVIDER_SITE_OTHER): Payer: Medicare HMO | Admitting: Internal Medicine

## 2018-10-29 VITALS — BP 136/78 | HR 72 | Temp 97.4°F | Resp 16 | Ht 76.0 in | Wt 247.2 lb

## 2018-10-29 DIAGNOSIS — I10 Essential (primary) hypertension: Secondary | ICD-10-CM

## 2018-10-29 DIAGNOSIS — E118 Type 2 diabetes mellitus with unspecified complications: Secondary | ICD-10-CM

## 2018-10-29 DIAGNOSIS — E538 Deficiency of other specified B group vitamins: Secondary | ICD-10-CM | POA: Diagnosis not present

## 2018-10-29 DIAGNOSIS — D473 Essential (hemorrhagic) thrombocythemia: Secondary | ICD-10-CM

## 2018-10-29 DIAGNOSIS — D75839 Thrombocytosis, unspecified: Secondary | ICD-10-CM | POA: Insufficient documentation

## 2018-10-29 DIAGNOSIS — E785 Hyperlipidemia, unspecified: Secondary | ICD-10-CM

## 2018-10-29 DIAGNOSIS — Z0000 Encounter for general adult medical examination without abnormal findings: Secondary | ICD-10-CM

## 2018-10-29 LAB — BASIC METABOLIC PANEL
BUN: 26 mg/dL — ABNORMAL HIGH (ref 6–23)
CO2: 29 mEq/L (ref 19–32)
Calcium: 10 mg/dL (ref 8.4–10.5)
Chloride: 100 mEq/L (ref 96–112)
Creatinine, Ser: 1.15 mg/dL (ref 0.40–1.50)
GFR: 65.56 mL/min (ref >60.00)
Glucose, Bld: 92 mg/dL (ref 70–99)
Potassium: 3.6 mEq/L (ref 3.5–5.1)
Sodium: 137 mEq/L (ref 135–145)

## 2018-10-29 LAB — CBC WITH DIFFERENTIAL/PLATELET
Basophils Absolute: 0.1 10*3/uL (ref 0.0–0.1)
Basophils Relative: 0.5 % (ref 0.0–3.0)
Eosinophils Absolute: 0.4 10*3/uL (ref 0.0–0.7)
Eosinophils Relative: 2.9 % (ref 0.0–5.0)
HCT: 47.5 % (ref 39.0–52.0)
Hemoglobin: 15.9 g/dL (ref 13.0–17.0)
Lymphocytes Relative: 9.6 % — ABNORMAL LOW (ref 12.0–46.0)
Lymphs Abs: 1.2 10*3/uL (ref 0.7–4.0)
MCHC: 33.5 g/dL (ref 30.0–36.0)
MCV: 85.2 fl (ref 78.0–100.0)
Monocytes Absolute: 1.1 10*3/uL — ABNORMAL HIGH (ref 0.1–1.0)
Monocytes Relative: 8.7 % (ref 3.0–12.0)
Neutro Abs: 9.9 10*3/uL — ABNORMAL HIGH (ref 1.4–7.7)
Neutrophils Relative %: 78.3 % — ABNORMAL HIGH (ref 43.0–77.0)
Platelets: 502 10*3/uL — ABNORMAL HIGH (ref 150.0–400.0)
RBC: 5.57 Mil/uL (ref 4.22–5.81)
RDW: 16 % — ABNORMAL HIGH (ref 11.5–15.5)
WBC: 12.7 10*3/uL — ABNORMAL HIGH (ref 4.0–10.5)

## 2018-10-29 LAB — LIPID PANEL
Cholesterol: 104 mg/dL (ref 0–200)
HDL: 33.5 mg/dL — ABNORMAL LOW (ref >39.00)
LDL Cholesterol: 50 mg/dL (ref 0–99)
NonHDL: 70.99
Total CHOL/HDL Ratio: 3
Triglycerides: 105 mg/dL (ref 0.0–149.0)
VLDL: 21 mg/dL (ref 0.0–40.0)

## 2018-10-29 LAB — POCT GLYCOSYLATED HEMOGLOBIN (HGB A1C): Hemoglobin A1C: 5.9 % — AB (ref 4.0–5.6)

## 2018-10-29 LAB — VITAMIN B12: Vitamin B-12: 256 pg/mL (ref 211–911)

## 2018-10-29 NOTE — Progress Notes (Signed)
Subjective:  Patient ID: Thomas Chung, male    DOB: Nov 14, 1941  Age: 77 y.o. MRN: [id number]  CC: Hypertension and Diabetes   HPI Thomas Chung presents for a CPX and f/up - He has lost a significant amount of weight since I last saw him with lifestyle modifications including diet and exercise.  He is very active and denies any recent episodes of CP, DOE, palpitations, edema, or fatigue.  He tells me his blood sugar and his blood pressure have been well controlled.  Past Medical History:  Diagnosis Date  . Depression   . Fatty liver   . Glucose intolerance (impaired glucose tolerance)   . HOH (hard of hearing)    bilateral hearing aides  . Hx of colonic polyps   . Hyperlipidemia   . Hypertension   . Neuromuscular disorder ([place])    neuropathy feet   Past Surgical History:  Procedure Laterality Date  . COLONOSCOPY  2006, 2009, 07/28/2011   2006 12 and 7 mm TVadenoma and adenoma 2009: 3 small adenomas 2012,:29m rectal polyp  . COLONOSCOPY    . ORIF FINGER FRACTURE  02/08/2012   Procedure: OPEN REDUCTION INTERNAL FIXATION (ORIF) METACARPAL (FINGER) FRACTURE;  Surgeon: RCammie Sickle, MD;  [place] [facility]  Service: Orthopedics;  Laterality: Right;  right small finger middle phalanx  . SQUAMOUS CELL CARCINOMA EXCISION     on scalp and nose  . TIBIA FRACTURE SURGERY  2001   right with hardware  . VASECTOMY    . WRIST FRACTURE SURGERY  2002   right    reports that he quit smoking about 49 years ago. He has never used smokeless tobacco. He reports current alcohol use of about 6.0 standard drinks of alcohol per week. He reports that he does not use drugs. family history includes Alcohol abuse in his unknown relative; Arrhythmia in his mother; Coronary artery disease in his unknown relative; Diabetes in his father and unknown relative; Heart attack in his mother; Heart disease in his mother; Heart failure in his father; Stomach cancer in his  sister. Allergies  Allergen Reactions  . Amlodipine Swelling  . Prevnar [Pneumococcal 13-Val Conj Vacc]     Joint pain, swelling    Outpatient Medications Prior to Visit  Medication Sig Dispense Refill  . Alcohol Swabs (B-D SINGLE USE SWABS REGULAR) PADS Inject 1 Act into the skin See admin instructions. 300 each 2  . atorvastatin (LIPITOR) 40 MG tablet TAKE 1 TABLET (40 MG TOTAL) BY MOUTH DAILY. 90 tablet 1  . Blood Glucose Monitoring Suppl (TRUE METRIX AIR GLUCOSE METER) w/Device KIT 1 Act by Does not apply route 2 (two) times daily. 1 kit 3  . chlorthalidone (HYGROTON) 25 MG tablet Take 1 tablet (25 mg total) by mouth daily. 90 tablet 1  . glucose blood (COOL BLOOD GLUCOSE TEST STRIPS) test strip Use as instructed to test blood sugar daily. DX: E11.8 100 each 3  . losartan (COZAAR) 100 MG tablet Take 1 tablet (100 mg total) by mouth daily. 90 tablet 1  . metoprolol succinate (TOPROL-XL) 25 MG 24 hr tablet Take 1 tablet (25 mg total) by mouth daily. Take with or immediately following a meal. 90 tablet 3  . tadalafil (CIALIS) 5 MG tablet Take 1 tablet (5 mg total) by mouth daily as needed for erectile dysfunction. 90 tablet 1  . TRUEPLUS LANCETS 30G MISC Use to test blood sugar twice daily. E11.8 200 each 3  . cyanocobalamin 1000  MCG tablet Take 1,000 mcg by mouth daily.    . ranitidine (ZANTAC) 150 MG capsule Take 1 capsule (150 mg total) by mouth 2 (two) times daily. 180 capsule 1   No facility-administered medications prior to visit.     ROS Review of Systems  Constitutional: Negative for diaphoresis and fatigue.  HENT: Negative.   Eyes: Negative for visual disturbance.  Respiratory: Negative for cough, chest tightness, shortness of breath and wheezing.   Cardiovascular: Negative for chest pain and leg swelling.  Gastrointestinal: Negative for abdominal pain, constipation, diarrhea, nausea and vomiting.  Endocrine: Negative.   Genitourinary: Negative.  Negative for difficulty  urinating.  Musculoskeletal: Negative.  Negative for arthralgias and myalgias.  Skin: Negative.  Negative for color change.  Neurological: Negative.  Negative for dizziness, weakness, light-headedness and numbness.  Hematological: Negative for adenopathy. Does not bruise/bleed easily.  Psychiatric/Behavioral: Negative.     Objective:  BP 136/78 (BP Location: Left Arm, Patient Position: Sitting, Cuff Size: Normal)   Pulse 72   Temp (!) 97.4 F (36.3 C) (Oral)   Resp 16   Ht _0  (1.93 m)   Wt 247 lb 4 oz (112.2 kg)   SpO2 97%   BMI 30.10 kg/m   BP Readings from Last 3 Encounters:  10/29/18 136/78  08/20/18 (!) 114/58  07/23/18 (!) 152/94    Wt Readings from Last 3 Encounters:  10/29/18 247 lb 4 oz (112.2 kg)  08/20/18 261 lb 12.8 oz (118.8 kg)  07/23/18 268 lb (121.6 kg)    Physical Exam Vitals signs reviewed.  Constitutional:      Appearance: Normal appearance.  HENT:     Mouth/Throat:     Mouth: Mucous membranes are moist.  Neck:     Musculoskeletal: Normal range of motion and neck supple.  Cardiovascular:     Rate and Rhythm: Normal rate and regular rhythm.     Heart sounds: Normal heart sounds.  Pulmonary:     Effort: Pulmonary effort is normal.     Breath sounds: Normal breath sounds. No wheezing, rhonchi or rales.  Abdominal:     General: Abdomen is flat. Bowel sounds are normal.     Palpations: Abdomen is soft. There is no mass.     Tenderness: There is no abdominal tenderness.  Musculoskeletal: Normal range of motion.        General: No swelling or tenderness.  Skin:    General: Skin is warm and dry.     Coloration: Skin is not pale.  Neurological:     General: No focal deficit present.     Mental Status: He is oriented to person, place, and time.  Psychiatric:        Mood and Affect: Mood normal.        Behavior: Behavior normal.        Thought Content: Thought content normal.        Judgment: Judgment normal.     Lab Results  Component  Value Date   WBC 12.7 (H) 10/29/2018   HGB 15.9 10/29/2018   HCT 47.5 10/29/2018   PLT 502.0 (H) 10/29/2018   GLUCOSE 92 10/29/2018   CHOL 104 10/29/2018   TRIG 105.0 10/29/2018   HDL 33.50 (L) 10/29/2018   LDLCALC 50 10/29/2018   ALT 18 01/22/2018   AST 17 01/22/2018   NA 137 10/29/2018   K 3.6 10/29/2018   CL 100 10/29/2018   CREATININE 1.15 10/29/2018   BUN 26 (H) 10/29/2018   CO2  29 10/29/2018   TSH 1.04 01/22/2018   PSA 0.07 (L) 01/22/2018   HGBA1C 5.9 (A) 10/29/2018   MICROALBUR 0.7 01/22/2018    Vas [place] Lower Extremity Venous (dvt)  Result Date: 01/28/2018  Lower Venous Study Indication: Pain, Swelling, and Gout. Examination Guidelines: A complete evaluation includes B-mode imaging, spectral doppler, color doppler, and power doppler as needed of all accessible portions of each vessel. Bilateral testing is considered an integral part of a complete examination. Limited examinations for reoccurring indications may be performed as noted. No prior study on file for comparison.  Right Venous Findings: +---+---------------+---------+-----------+----------+-------+    CompressibilityPhasicitySpontaneityPropertiesSummary +---+---------------+---------+-----------+----------+-------+ CFVFull           Yes      Yes                          +---+---------------+---------+-----------+----------+-------+  Left Venous Findings: +---------+---------------+---------+-----------+----------+-------+          CompressibilityPhasicitySpontaneityPropertiesSummary +---------+---------------+---------+-----------+----------+-------+ CFV      Full           Yes      Yes                          +---------+---------------+---------+-----------+----------+-------+ FV Prox  Full                                                 +---------+---------------+---------+-----------+----------+-------+ FV Mid   Full                                                  +---------+---------------+---------+-----------+----------+-------+ FV DistalFull                                                 +---------+---------------+---------+-----------+----------+-------+ PFV      Full           Yes      Yes                          +---------+---------------+---------+-----------+----------+-------+ POP      Full           Yes      Yes                          +---------+---------------+---------+-----------+----------+-------+ PTV      Full                                                 +---------+---------------+---------+-----------+----------+-------+ PERO     Full                                                 +---------+---------------+---------+-----------+----------+-------+ GSV      Full                                                 +---------+---------------+---------+-----------+----------+-------+  Final Interpretation: Right: No evidence of common femoral vein obstruction. Left: Ultrasound characteristics of enlarged lymph nodes noted in the groin. There is no evidence of deep vein thrombosis in the lower extremity.There is no evidence of superficial venous thrombosis.  *See table(s) above for measurements and observations. Electronically signed by Ruta Hinds on 01/28/2018 at 8:49:09 PM.  Assessment & Plan:   Thomas Chung was seen today for hypertension and diabetes.  Diagnoses and all orders for this visit:  Type 2 diabetes mellitus with complication, without long-term current use of insulin ([place])- His A1c is at 5.9%.  His blood sugars are well controlled with lifestyle modifications.  Medical therapy is not indicated. -     POCT glycosylated hemoglobin (Hb A1C)  Type II diabetes mellitus with manifestations (HCC)  Essential hypertension, benign -blood pressure is well controlled.  Electrolytes and renal function are normal. -     Basic metabolic panel; Future  Hyperlipidemia with target LDL less than 100- He has  achieved his LDL goal and is doing well on the statin. -     Lipid panel; Future  Routine general medical examination at a health care facility  Thrombocytosis [facility])- His platelet count is rising and his B12 level is going down.  He also has a slightly elevated white cell count but his H&H are normal. I will continue to monitor him for lymphoproliferative disease but at this time I think the best thing to do regarding the elevated platelet count is to be sure that he is taking his vitamin B12 supplement. -     CBC with Differential/Platelet; Future -     Vitamin B12; Future -     Methylmalonic acid, serum; Future  B12 deficiency- See above -     cyanocobalamin 1000 MCG tablet; Take 1 tablet (1,000 mcg total) by mouth daily.   I have discontinued Thomas Chung's ranitidine. I have also changed his cyanocobalamin. Additionally, I am having him maintain his metoprolol succinate, B-D SINGLE USE SWABS REGULAR, tadalafil, TRUE METRIX AIR GLUCOSE METER, TRUEPLUS LANCETS 30G, glucose blood, losartan, chlorthalidone, and atorvastatin.  Meds ordered this encounter  Medications  . cyanocobalamin 1000 MCG tablet    Sig: Take 1 tablet (1,000 mcg total) by mouth daily.    Dispense:  90 tablet    Refill:  1   See AVS for instructions about healthy living and anticipatory guidance.  Follow-up: Return in about 6 months (around 04/30/2019).  Scarlette Calico, MD

## 2018-10-29 NOTE — Patient Instructions (Signed)

## 2018-10-31 DIAGNOSIS — E538 Deficiency of other specified B group vitamins: Secondary | ICD-10-CM | POA: Insufficient documentation

## 2018-10-31 MED ORDER — CYANOCOBALAMIN 1000 MCG PO TABS: 1000.0000 ug | ORAL_TABLET | Freq: Every day | ORAL | 1 refills | Status: DC

## 2018-10-31 NOTE — Assessment & Plan Note (Signed)

## 2018-11-07 LAB — HM DIABETES EYE EXAM

## 2018-11-15 DIAGNOSIS — H02831 Dermatochalasis of right upper eyelid: Secondary | ICD-10-CM | POA: Diagnosis not present

## 2018-11-15 DIAGNOSIS — E119 Type 2 diabetes mellitus without complications: Secondary | ICD-10-CM | POA: Diagnosis not present

## 2018-11-15 DIAGNOSIS — Z961 Presence of intraocular lens: Secondary | ICD-10-CM | POA: Diagnosis not present

## 2018-11-15 DIAGNOSIS — H35033 Hypertensive retinopathy, bilateral: Secondary | ICD-10-CM | POA: Diagnosis not present

## 2018-11-28 LAB — HM DIABETES EYE EXAM

## 2018-12-23 ENCOUNTER — Other Ambulatory Visit: Payer: Self-pay | Admitting: Cardiology

## 2018-12-23 DIAGNOSIS — I10 Essential (primary) hypertension: Secondary | ICD-10-CM

## 2019-01-21 ENCOUNTER — Encounter: Payer: Self-pay | Admitting: Family

## 2019-01-21 ENCOUNTER — Ambulatory Visit (INDEPENDENT_AMBULATORY_CARE_PROVIDER_SITE_OTHER): Payer: Medicare HMO | Admitting: Family

## 2019-01-21 ENCOUNTER — Other Ambulatory Visit (INDEPENDENT_AMBULATORY_CARE_PROVIDER_SITE_OTHER): Payer: Medicare HMO

## 2019-01-21 VITALS — BP 118/70 | HR 63 | Temp 98.3°F | Ht 76.0 in | Wt 250.0 lb

## 2019-01-21 DIAGNOSIS — K591 Functional diarrhea: Secondary | ICD-10-CM | POA: Diagnosis not present

## 2019-01-21 LAB — COMPREHENSIVE METABOLIC PANEL
ALT: 18 U/L (ref 0–53)
AST: 18 U/L (ref 0–37)
Albumin: 4.6 g/dL (ref 3.5–5.2)
Alkaline Phosphatase: 54 U/L (ref 39–117)
BUN: 36 mg/dL — ABNORMAL HIGH (ref 6–23)
CO2: 25 mEq/L (ref 19–32)
Calcium: 9.9 mg/dL (ref 8.4–10.5)
Chloride: 102 mEq/L (ref 96–112)
Creatinine, Ser: 1.33 mg/dL (ref 0.40–1.50)
GFR: 52.12 mL/min — ABNORMAL LOW (ref >60.00)
Glucose, Bld: 87 mg/dL (ref 70–99)
Potassium: 3.8 mEq/L (ref 3.5–5.1)
Sodium: 137 mEq/L (ref 135–145)
Total Bilirubin: 0.7 mg/dL (ref 0.2–1.2)
Total Protein: 7 g/dL (ref 6.0–8.3)

## 2019-01-21 LAB — CBC WITH DIFFERENTIAL/PLATELET
Basophils Absolute: 0.1 10*3/uL (ref 0.0–0.1)
Basophils Relative: 1 % (ref 0.0–3.0)
Eosinophils Absolute: 0.4 10*3/uL (ref 0.0–0.7)
Eosinophils Relative: 3.2 % (ref 0.0–5.0)
HCT: 43.8 % (ref 39.0–52.0)
Hemoglobin: 15 g/dL (ref 13.0–17.0)
Lymphocytes Relative: 10.8 % — ABNORMAL LOW (ref 12.0–46.0)
Lymphs Abs: 1.3 10*3/uL (ref 0.7–4.0)
MCHC: 34.3 g/dL (ref 30.0–36.0)
MCV: 86.9 fl (ref 78.0–100.0)
Monocytes Absolute: 1.2 10*3/uL — ABNORMAL HIGH (ref 0.1–1.0)
Monocytes Relative: 10 % (ref 3.0–12.0)
Neutro Abs: 8.8 10*3/uL — ABNORMAL HIGH (ref 1.4–7.7)
Neutrophils Relative %: 75 % (ref 43.0–77.0)
Platelets: 418 10*3/uL — ABNORMAL HIGH (ref 150.0–400.0)
RBC: 5.05 Mil/uL (ref 4.22–5.81)
RDW: 14.4 % (ref 11.5–15.5)
WBC: 11.7 10*3/uL — ABNORMAL HIGH (ref 4.0–10.5)

## 2019-01-21 NOTE — Patient Instructions (Signed)
Food Choices to Help Relieve Diarrhea, Adult  When you have diarrhea, the foods you eat and your eating habits are very important. Choosing the right foods and drinks can help:   Relieve diarrhea.   Replace lost fluids and nutrients.   Prevent dehydration.  What general guidelines should I follow?    Relieving diarrhea   Choose foods with less than 2 g or .07 oz. of fiber per serving.   Limit fats to less than 8 tsp (38 g or 1.34 oz.) a day.   Avoid the following:  ? Foods and beverages sweetened with high-fructose corn syrup, honey, or sugar alcohols such as xylitol, sorbitol, and mannitol.  ? Foods that contain a lot of fat or sugar.  ? Fried, greasy, or spicy foods.  ? High-fiber grains, breads, and cereals.  ? Raw fruits and vegetables.   Eat foods that are rich in probiotics. These foods include dairy products such as yogurt and fermented milk products. They help increase healthy bacteria in the stomach and intestines (gastrointestinal tract, or GI tract).   If you have lactose intolerance, avoid dairy products. These may make your diarrhea worse.   Take medicine to help stop diarrhea (antidiarrheal medicine) only as told by your health care provider.  Replacing nutrients   Eat small meals or snacks every 3-4 hours.   Eat bland foods, such as white rice, toast, or baked potato, until your diarrhea starts to get better. Gradually reintroduce nutrient-rich foods as tolerated or as told by your health care provider. This includes:  ? Well-cooked protein foods.  ? Peeled, seeded, and soft-cooked fruits and vegetables.  ? Low-fat dairy products.   Take vitamin and mineral supplements as told by your health care provider.  Preventing dehydration   Start by sipping water or a special solution to prevent dehydration (oral rehydration solution, ORS). Urine that is clear or pale yellow means that you are getting enough fluid.   Try to drink at least 8-10 cups of fluid each day to help replace lost  fluids.   You may add other liquids in addition to water, such as clear juice or decaffeinated sports drinks, as tolerated or as told by your health care provider.   Avoid drinks with caffeine, such as coffee, tea, or soft drinks.   Avoid alcohol.  What foods are recommended?         The items listed may not be a complete list. Talk with your health care provider about what dietary choices are best for you.  Grains  White rice. White, French, or pita breads (fresh or toasted), including plain rolls, buns, or bagels. White pasta. Saltine, soda, or graham crackers. Pretzels. Low-fiber cereal. Cooked cereals made with water (such as cornmeal, farina, or cream cereals). Plain muffins. Matzo. Melba toast. Zwieback.  Vegetables  Potatoes (without the skin). Most well-cooked and canned vegetables without skins or seeds. Tender lettuce.  Fruits  Apple sauce. Fruits canned in juice. Cooked apricots, cherries, grapefruit, peaches, pears, or plums. Fresh bananas and cantaloupe.  Meats and other protein foods  Baked or boiled chicken. Eggs. Tofu. Fish. Seafood. Smooth nut butters. Ground or well-cooked tender beef, ham, veal, lamb, pork, or poultry.  Dairy  Plain yogurt, kefir, and unsweetened liquid yogurt. Lactose-free milk, buttermilk, skim milk, or soy milk. Low-fat or nonfat hard cheese.  Beverages  Water. Low-calorie sports drinks. Fruit juices without pulp. Strained tomato and vegetable juices. Decaffeinated teas. Sugar-free beverages not sweetened with sugar alcohols. Oral rehydration solutions, if   approved by your health care provider.  Seasoning and other foods  Bouillon, broth, or soups made from recommended foods.  What foods are not recommended?  The items listed may not be a complete list. Talk with your health care provider about what dietary choices are best for you.  Grains  Whole grain, whole wheat, bran, or rye breads, rolls, pastas, and crackers. Wild or brown rice. Whole grain or bran cereals. Barley.  Oats and oatmeal. Corn tortillas or taco shells. Granola. Popcorn.  Vegetables  Raw vegetables. Fried vegetables. Cabbage, broccoli, Brussels sprouts, artichokes, baked beans, beet greens, corn, kale, legumes, peas, sweet potatoes, and yams. Potato skins. Cooked spinach and cabbage.  Fruits  Dried fruit, including raisins and dates. Raw fruits. Stewed or dried prunes. Canned fruits with syrup.  Meat and other protein foods  Fried or fatty meats. Deli meats. Chunky nut butters. Nuts and seeds. Beans and lentils. Bacon. Hot dogs. Sausage.  Dairy  High-fat cheeses. Whole milk, chocolate milk, and beverages made with milk, such as milk shakes. Half-and-half. Cream. sour cream. Ice cream.  Beverages  Caffeinated beverages (such as coffee, tea, soda, or energy drinks). Alcoholic beverages. Fruit juices with pulp. Prune juice. Soft drinks sweetened with high-fructose corn syrup or sugar alcohols. High-calorie sports drinks.  Fats and oils  Butter. Cream sauces. Margarine. Salad oils. Plain salad dressings. Olives. Avocados. Mayonnaise.  Sweets and desserts  Sweet rolls, doughnuts, and sweet breads. Sugar-free desserts sweetened with sugar alcohols such as xylitol and sorbitol.  Seasoning and other foods  Honey. Hot sauce. Chili powder. Gravy. Cream-based or milk-based soups. Pancakes and waffles.  Summary   When you have diarrhea, the foods you eat and your eating habits are very important.   Make sure you get at least 8-10 cups of fluid each day, or enough to keep your urine clear or pale yellow.   Eat bland foods and gradually reintroduce healthy, nutrient-rich foods as tolerated, or as told by your health care provider.   Avoid high-fiber, fried, greasy, or spicy foods.  This information is not intended to replace advice given to you by your health care provider. Make sure you discuss any questions you have with your health care provider.  Document Released: 01/27/2004 Document Revised: 11/03/2016 Document Reviewed:  11/03/2016  Elsevier Interactive Patient Education  2019 [employer] Inc[employer]

## 2019-01-21 NOTE — Progress Notes (Signed)
Thomas Chung is a 78 y.o. male with the following history as recorded in Epic[employer]:  Patient Active Problem List   Diagnosis Date Noted  . B12 deficiency 10/31/2018  . Thrombocytosis ([place]) 10/29/2018  . GERD without esophagitis 01/22/2018  . Asymptomatic gallstones 09/09/2015  . Routine general medical examination at a health care facility 02/10/2015  . Prostate nodule with urinary obstruction 07/17/2011  . Type II diabetes mellitus with manifestations ([place]) 08/05/2009  . Diabetic neuropathy, painful ([place]) 07/06/2009  . Hyperlipidemia with target LDL less than 100 04/22/2009  . Essential hypertension, benign 04/22/2009  . Erectile dysfunction associated with type 2 diabetes mellitus ([place]) 04/21/2009    Current Outpatient Medications  Medication Sig Dispense Refill  . Alcohol Swabs (B-D SINGLE USE SWABS REGULAR) PADS Inject 1 Act into the skin See admin instructions. 300 each 2  . atorvastatin (LIPITOR) 40 MG tablet TAKE 1 TABLET (40 MG TOTAL) BY MOUTH DAILY. 90 tablet 1  . Blood Glucose Monitoring Suppl (TRUE METRIX AIR GLUCOSE METER) w/Device KIT 1 Act by Does not apply route 2 (two) times daily. 1 kit 3  . chlorthalidone (HYGROTON) 25 MG tablet Take 1 tablet (25 mg total) by mouth daily. 90 tablet 1  . cyanocobalamin 1000 MCG tablet Take 1 tablet (1,000 mcg total) by mouth daily. 90 tablet 1  . glucose blood (COOL BLOOD GLUCOSE TEST STRIPS) test strip Use as instructed to test blood sugar daily. DX: E11.8 100 each 3  . loperamide (IMODIUM) 2 MG capsule Take by mouth as needed for diarrhea or loose stools.    Marland Kitchen losartan (COZAAR) 100 MG tablet Take 1 tablet (100 mg total) by mouth daily. 90 tablet 1  . metoprolol succinate (TOPROL-XL) 25 MG 24 hr tablet TAKE 1 TABLET (25 MG TOTAL) BY MOUTH DAILY. TAKE WITH OR IMMEDIATELY FOLLOWING A MEAL. 90 tablet 3  . tadalafil (CIALIS) 5 MG tablet Take 1 tablet (5 mg total) by mouth daily as needed for erectile dysfunction. 90 tablet 1  . TRUEPLUS  LANCETS 30G MISC Use to test blood sugar twice daily. E11.8 200 each 3   No current facility-administered medications for this visit.     Allergies: Amlodipine and Prevnar [pneumococcal 13-val conj vacc]  Past Medical History:  Diagnosis Date  . Depression   . Fatty liver   . Glucose intolerance (impaired glucose tolerance)   . HOH (hard of hearing)    bilateral hearing aides  . Hx of colonic polyps   . Hyperlipidemia   . Hypertension   . Neuromuscular disorder ([place])    neuropathy feet    Past Surgical History:  Procedure Laterality Date  . COLONOSCOPY  2006, 2009, 07/28/2011   2006 12 and 7 mm TVadenoma and adenoma 2009: 3 small adenomas 2012,:61m rectal polyp  . COLONOSCOPY    . ORIF FINGER FRACTURE  02/08/2012   Procedure: OPEN REDUCTION INTERNAL FIXATION (ORIF) METACARPAL (FINGER) FRACTURE;  Surgeon: RCammie Sickle, MD;  Location[place] [facility]  Service: Orthopedics;  Laterality: Right;  right small finger middle phalanx  . SQUAMOUS CELL CARCINOMA EXCISION     on scalp and nose  . TIBIA FRACTURE SURGERY  2001   right with hardware  . VASECTOMY    . WRIST FRACTURE SURGERY  2002   right    Family History  Problem Relation Age of Onset  . Arrhythmia Mother   . Heart attack Mother   . Heart disease Mother   . Heart failure Father   .  Diabetes Father   . Coronary artery disease Unknown        1st degree relative<60  . Alcohol abuse Unknown   . Diabetes Unknown   . Stomach cancer Sister   . Cancer Neg Hx   . Stroke Neg Hx   . Hyperlipidemia Neg Hx   . Hypertension Neg Hx   . Kidney disease Neg Hx     Social History   Tobacco Use  . Smoking status: Former Smoker    Last attempt to quit: 09/21/1969    Years since quitting: 49.3  . Smokeless tobacco: Never Used  Substance Use Topics  . 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  Head: Normocephalic and atraumatic  Lungs: Respirations unlabored; clear to auscultation bilaterally without wheeze, rales, rhonchi  CVS exam: normal rate and regular rhythm.  Abdomen: Soft; nontender; nondistended; normoactive bowel sounds; no masses or hepatosplenomegaly  Neurologic: Alert and oriented; speech intact; face symmetrical; moves all extremities well; CNII-XII intact without focal deficit   Assessment:  1. Functional diarrhea     Plan:  Check CBC, CMP and GI pathogen panel today; BRAT diet, need for hydration discussed; follow-up to be determined.  No follow-ups on file.  Orders Placed This Encounter  Procedures  . CBC w/Diff    Standing Status:   Future    Number of Occurrences:   1    Standing Expiration Date:   01/21/2020  . Comp Met (CMET)    Standing Status:   Future    Number of Occurrences:   1    Standing Expiration Date:   01/21/2020  . Gastrointestinal Pathogen Panel PCR    Standing Status:   Future    Standing Expiration Date:   01/21/2020    Requested Prescriptions    No prescriptions requested or ordered in this encounter

## 2019-01-22 ENCOUNTER — Other Ambulatory Visit: Payer: Medicare HMO

## 2019-01-22 DIAGNOSIS — K591 Functional diarrhea: Secondary | ICD-10-CM

## 2019-01-22 DIAGNOSIS — R109 Unspecified abdominal pain: Secondary | ICD-10-CM | POA: Diagnosis not present

## 2019-01-22 DIAGNOSIS — R197 Diarrhea, unspecified: Secondary | ICD-10-CM | POA: Diagnosis not present

## 2019-01-23 ENCOUNTER — Encounter: Payer: Self-pay | Admitting: Family

## 2019-01-27 LAB — GASTROINTESTINAL PATHOGEN PANEL PCR
C. difficile Tox A/B, PCR: NOT DETECTED
Campylobacter, PCR: NOT DETECTED
Cryptosporidium, PCR: NOT DETECTED
E coli (ETEC) LT/ST PCR: NOT DETECTED
E coli (STEC) stx1/stx2, PCR: NOT DETECTED
E coli 0157, PCR: NOT DETECTED
Giardia lamblia, PCR: NOT DETECTED
Norovirus, PCR: NOT DETECTED
Rotavirus A, PCR: NOT DETECTED
Salmonella, PCR: NOT DETECTED
Shigella, PCR: NOT DETECTED

## 2019-02-09 ENCOUNTER — Other Ambulatory Visit: Payer: Self-pay | Admitting: Internal Medicine

## 2019-02-09 DIAGNOSIS — E785 Hyperlipidemia, unspecified: Secondary | ICD-10-CM

## 2019-02-09 DIAGNOSIS — E118 Type 2 diabetes mellitus with unspecified complications: Secondary | ICD-10-CM

## 2019-02-09 DIAGNOSIS — I10 Essential (primary) hypertension: Secondary | ICD-10-CM

## 2019-03-04 ENCOUNTER — Other Ambulatory Visit: Payer: Self-pay | Admitting: Internal Medicine

## 2019-03-04 DIAGNOSIS — N521 Erectile dysfunction due to diseases classified elsewhere: Secondary | ICD-10-CM

## 2019-03-04 DIAGNOSIS — N138 Other obstructive and reflux uropathy: Secondary | ICD-10-CM

## 2019-03-04 DIAGNOSIS — E1169 Type 2 diabetes mellitus with other specified complication: Secondary | ICD-10-CM

## 2019-03-04 DIAGNOSIS — N403 Nodular prostate with lower urinary tract symptoms: Principal | ICD-10-CM

## 2019-03-04 MED ORDER — TADALAFIL 5 MG PO TABS: 5.0000 mg | ORAL_TABLET | Freq: Every day | ORAL | 1 refills | Status: AC | PRN

## 2019-03-19 ENCOUNTER — Other Ambulatory Visit: Payer: Self-pay | Admitting: Internal Medicine

## 2019-03-27 DIAGNOSIS — D2272 Melanocytic nevi of left lower limb, including hip: Secondary | ICD-10-CM | POA: Diagnosis not present

## 2019-03-27 DIAGNOSIS — L820 Inflamed seborrheic keratosis: Secondary | ICD-10-CM | POA: Diagnosis not present

## 2019-03-27 DIAGNOSIS — L821 Other seborrheic keratosis: Secondary | ICD-10-CM | POA: Diagnosis not present

## 2019-03-27 DIAGNOSIS — D2271 Melanocytic nevi of right lower limb, including hip: Secondary | ICD-10-CM | POA: Diagnosis not present

## 2019-03-27 DIAGNOSIS — D225 Melanocytic nevi of trunk: Secondary | ICD-10-CM | POA: Diagnosis not present

## 2019-03-27 DIAGNOSIS — D485 Neoplasm of uncertain behavior of skin: Secondary | ICD-10-CM | POA: Diagnosis not present

## 2019-03-27 DIAGNOSIS — L570 Actinic keratosis: Secondary | ICD-10-CM | POA: Diagnosis not present

## 2019-03-27 DIAGNOSIS — Z85828 Personal history of other malignant neoplasm of skin: Secondary | ICD-10-CM | POA: Diagnosis not present

## 2019-03-27 DIAGNOSIS — L812 Freckles: Secondary | ICD-10-CM | POA: Diagnosis not present

## 2019-05-06 ENCOUNTER — Encounter: Payer: Self-pay | Admitting: Internal Medicine

## 2019-05-06 ENCOUNTER — Other Ambulatory Visit: Payer: Self-pay

## 2019-05-06 ENCOUNTER — Ambulatory Visit (INDEPENDENT_AMBULATORY_CARE_PROVIDER_SITE_OTHER): Payer: Medicare HMO | Admitting: Internal Medicine

## 2019-05-06 ENCOUNTER — Other Ambulatory Visit (INDEPENDENT_AMBULATORY_CARE_PROVIDER_SITE_OTHER): Payer: Medicare HMO

## 2019-05-06 VITALS — BP 146/76 | HR 78 | Temp 98.4°F | Resp 16 | Ht 76.0 in | Wt 257.0 lb

## 2019-05-06 DIAGNOSIS — E118 Type 2 diabetes mellitus with unspecified complications: Secondary | ICD-10-CM

## 2019-05-06 DIAGNOSIS — I10 Essential (primary) hypertension: Secondary | ICD-10-CM

## 2019-05-06 DIAGNOSIS — D75839 Thrombocytosis, unspecified: Secondary | ICD-10-CM

## 2019-05-06 DIAGNOSIS — E1169 Type 2 diabetes mellitus with other specified complication: Secondary | ICD-10-CM

## 2019-05-06 DIAGNOSIS — E538 Deficiency of other specified B group vitamins: Secondary | ICD-10-CM

## 2019-05-06 DIAGNOSIS — R197 Diarrhea, unspecified: Secondary | ICD-10-CM

## 2019-05-06 DIAGNOSIS — D473 Essential (hemorrhagic) thrombocythemia: Secondary | ICD-10-CM | POA: Diagnosis not present

## 2019-05-06 LAB — FOLATE: Folate: 20 ng/mL (ref >5.9)

## 2019-05-06 LAB — CBC WITH DIFFERENTIAL/PLATELET
Basophils Absolute: 0.1 10*3/uL (ref 0.0–0.1)
Basophils Relative: 0.6 % (ref 0.0–3.0)
Eosinophils Absolute: 0.2 10*3/uL (ref 0.0–0.7)
Eosinophils Relative: 1.6 % (ref 0.0–5.0)
HCT: 46.1 % (ref 39.0–52.0)
Hemoglobin: 15.7 g/dL (ref 13.0–17.0)
Lymphocytes Relative: 10.5 % — ABNORMAL LOW (ref 12.0–46.0)
Lymphs Abs: 1.3 10*3/uL (ref 0.7–4.0)
MCHC: 34.1 g/dL (ref 30.0–36.0)
MCV: 88 fl (ref 78.0–100.0)
Monocytes Absolute: 1.1 10*3/uL — ABNORMAL HIGH (ref 0.1–1.0)
Monocytes Relative: 9.3 % (ref 3.0–12.0)
Neutro Abs: 9.3 10*3/uL — ABNORMAL HIGH (ref 1.4–7.7)
Neutrophils Relative %: 78 % — ABNORMAL HIGH (ref 43.0–77.0)
Platelets: 457 10*3/uL — ABNORMAL HIGH (ref 150.0–400.0)
RBC: 5.24 Mil/uL (ref 4.22–5.81)
RDW: 14.6 % (ref 11.5–15.5)
WBC: 11.9 10*3/uL — ABNORMAL HIGH (ref 4.0–10.5)

## 2019-05-06 LAB — BASIC METABOLIC PANEL
BUN: 28 mg/dL — ABNORMAL HIGH (ref 6–23)
CO2: 27 mEq/L (ref 19–32)
Calcium: 10.1 mg/dL (ref 8.4–10.5)
Chloride: 101 mEq/L (ref 96–112)
Creatinine, Ser: 1.18 mg/dL (ref 0.40–1.50)
GFR: 59.79 mL/min — ABNORMAL LOW (ref >60.00)
Glucose, Bld: 93 mg/dL (ref 70–99)
Potassium: 4 mEq/L (ref 3.5–5.1)
Sodium: 138 mEq/L (ref 135–145)

## 2019-05-06 LAB — URINALYSIS, ROUTINE W REFLEX MICROSCOPIC
Bilirubin Urine: NEGATIVE
Hgb urine dipstick: NEGATIVE
Ketones, ur: NEGATIVE
Leukocytes,Ua: NEGATIVE
Nitrite: NEGATIVE
RBC / HPF: NONE SEEN (ref 0–?)
Specific Gravity, Urine: 1.015 (ref 1.000–1.030)
Total Protein, Urine: NEGATIVE
Urine Glucose: NEGATIVE
Urobilinogen, UA: 0.2 (ref 0.0–1.0)
pH: 6 (ref 5.0–8.0)

## 2019-05-06 LAB — MICROALBUMIN / CREATININE URINE RATIO
Creatinine,U: 77.7 mg/dL
Microalb Creat Ratio: 2.2 mg/g (ref 0.0–30.0)
Microalb, Ur: 1.7 mg/dL (ref 0.0–1.9)

## 2019-05-06 LAB — VITAMIN B12: Vitamin B-12: 1451 pg/mL — ABNORMAL HIGH (ref 211–911)

## 2019-05-06 LAB — HEMOGLOBIN A1C: Hgb A1c MFr Bld: 6.5 % (ref 4.6–6.5)

## 2019-05-06 NOTE — Progress Notes (Signed)
Subjective:  Patient ID: Thomas Chung, male    DOB: [birth year]  Age: 78 y.o. MRN: [id number]  CC: Hypertension, Diabetes, and Anemia   HPI Thomas Chung presents for f/up - He complains of a several month history of loose stools.  He has about 2 bowel movements a day that he describes as being loose and says they float in the toilet.  He denies abdominal pain, loss of appetite, weight loss, nausea, vomiting, melena, or bright red blood per rectum.  He gets adequate symptom relief with the occasional dose of Imodium.  At the onset of his symptoms he had a stool PCR performed to screen for enteric pathogens and it was negative.  Outpatient Medications Prior to Visit  Medication Sig Dispense Refill  . ACCU-CHEK AVIVA PLUS test strip USE AS INSTRUCTED TO TEST BLOOD SUGAR DAILY 100 each 3  . Alcohol Swabs (B-D SINGLE USE SWABS REGULAR) PADS Inject 1 Act into the skin See admin instructions. 300 each 2  . atorvastatin (LIPITOR) 40 MG tablet TAKE 1 TABLET (40 MG TOTAL) BY MOUTH DAILY. 90 tablet 1  . Blood Glucose Monitoring Suppl (TRUE METRIX AIR GLUCOSE METER) w/Device KIT 1 Act by Does not apply route 2 (two) times daily. 1 kit 3  . chlorthalidone (HYGROTON) 25 MG tablet TAKE 1 TABLET (25 MG TOTAL) BY MOUTH DAILY. 90 tablet 1  . Coenzyme Q10 (COQ10 PO)     . cyanocobalamin 1000 MCG tablet Take 1 tablet (1,000 mcg total) by mouth daily. 90 tablet 1  . loperamide (IMODIUM) 2 MG capsule Take by mouth as needed for diarrhea or loose stools.    Marland Kitchen losartan (COZAAR) 100 MG tablet TAKE 1 TABLET (100 MG TOTAL) BY MOUTH DAILY. 90 tablet 1  . metoprolol succinate (TOPROL-XL) 25 MG 24 hr tablet TAKE 1 TABLET (25 MG TOTAL) BY MOUTH DAILY. TAKE WITH OR IMMEDIATELY FOLLOWING A MEAL. 90 tablet 3  . tadalafil (CIALIS) 5 MG tablet Take 1 tablet (5 mg total) by mouth daily as needed for erectile dysfunction. 90 tablet 1  . TRUEPLUS LANCETS 30G MISC Use to test blood sugar twice daily. E11.8 200 each 3   No  facility-administered medications prior to visit.     ROS Review of Systems  Constitutional: Positive for unexpected weight change (wt gain). Negative for appetite change, chills, diaphoresis, fatigue and fever.  HENT: Negative.  Negative for trouble swallowing.   Eyes: Negative for visual disturbance.  Respiratory: Negative for cough, chest tightness, shortness of breath and wheezing.   Gastrointestinal: Positive for diarrhea. Negative for abdominal pain, constipation, nausea and vomiting.  Endocrine: Negative.   Genitourinary: Negative.  Negative for difficulty urinating and dysuria.  Musculoskeletal: Negative.   Skin: Negative.  Negative for color change and pallor.  Neurological: Negative.  Negative for dizziness, weakness, light-headedness and numbness.  Hematological: Negative for adenopathy. Does not bruise/bleed easily.  Psychiatric/Behavioral: Negative.     Objective:  BP (!) 146/76 (BP Location: Left Arm, Patient Position: Sitting, Cuff Size: Large)   Pulse 78   Temp 98.4 F (36.9 C) (Oral)   Resp 16   Ht 6' 4" (1.93 m)   Wt 257 lb (116.6 kg)   SpO2 98%   BMI 31.28 kg/m   BP Readings from Last 3 Encounters:  05/06/19 (!) 146/76  01/21/19 118/70  10/29/18 136/78    Wt Readings from Last 3 Encounters:  05/06/19 257 lb (116.6 kg)  01/21/19 250 lb 0.6 oz (113.4 kg)  10/29/18  247 lb 4 oz (112.2 kg)    Physical Exam Vitals signs reviewed.  Constitutional:      Appearance: He is not ill-appearing or diaphoretic.  HENT:     Nose: Nose normal. No rhinorrhea.     Mouth/Throat:     Mouth: Mucous membranes are moist.     Pharynx: No posterior oropharyngeal erythema.  Eyes:     General: No scleral icterus.    Conjunctiva/sclera: Conjunctivae normal.  Neck:     Musculoskeletal: Normal range of motion. No neck rigidity.  Cardiovascular:     Rate and Rhythm: Normal rate and regular rhythm.     Heart sounds: No murmur. No gallop.   Pulmonary:     Effort:  Pulmonary effort is normal.     Breath sounds: No stridor. No wheezing, rhonchi or rales.  Abdominal:     General: Abdomen is flat. Bowel sounds are normal.     Palpations: Abdomen is soft. There is no hepatomegaly or splenomegaly.     Tenderness: There is no abdominal tenderness.  Musculoskeletal: Normal range of motion.     Right lower leg: No edema.     Left lower leg: No edema.  Lymphadenopathy:     Cervical: No cervical adenopathy.  Skin:    General: Skin is warm and dry.     Findings: No rash.  Neurological:     General: No focal deficit present.     Mental Status: He is alert and oriented to person, place, and time. Mental status is at baseline.  Psychiatric:        Mood and Affect: Mood normal.        Behavior: Behavior normal.     Lab Results  Component Value Date   WBC 11.9 (H) 05/06/2019   HGB 15.7 05/06/2019   HCT 46.1 05/06/2019   PLT 457.0 (H) 05/06/2019   GLUCOSE 93 05/06/2019   CHOL 104 10/29/2018   TRIG 105.0 10/29/2018   HDL 33.50 (L) 10/29/2018   LDLCALC 50 10/29/2018   ALT 18 01/21/2019   AST 18 01/21/2019   NA 138 05/06/2019   K 4.0 05/06/2019   CL 101 05/06/2019   CREATININE 1.18 05/06/2019   BUN 28 (H) 05/06/2019   CO2 27 05/06/2019   TSH 1.44 05/07/2019   PSA 0.07 (L) 01/22/2018   HGBA1C 6.5 05/06/2019   MICROALBUR 1.7 05/06/2019    Vas [place] Lower Extremity Venous (dvt)  Result Date: 01/28/2018  Lower Venous Study Indication: Pain, Swelling, and Gout. Examination Guidelines: A complete evaluation includes B-mode imaging, spectral doppler, color doppler, and power doppler as needed of all accessible portions of each vessel. Bilateral testing is considered an integral part of a complete examination. Limited examinations for reoccurring indications may be performed as noted. No prior study on file for comparison.  Right Venous Findings: +---+---------------+---------+-----------+----------+-------+     CompressibilityPhasicitySpontaneityPropertiesSummary +---+---------------+---------+-----------+----------+-------+ CFVFull           Yes      Yes                          +---+---------------+---------+-----------+----------+-------+  Left Venous Findings: +---------+---------------+---------+-----------+----------+-------+          CompressibilityPhasicitySpontaneityPropertiesSummary +---------+---------------+---------+-----------+----------+-------+ CFV      Full           Yes      Yes                          +---------+---------------+---------+-----------+----------+-------+  FV Prox  Full                                                 +---------+---------------+---------+-----------+----------+-------+ FV Mid   Full                                                 +---------+---------------+---------+-----------+----------+-------+ FV DistalFull                                                 +---------+---------------+---------+-----------+----------+-------+ PFV      Full           Yes      Yes                          +---------+---------------+---------+-----------+----------+-------+ POP      Full           Yes      Yes                          +---------+---------------+---------+-----------+----------+-------+ PTV      Full                                                 +---------+---------------+---------+-----------+----------+-------+ PERO     Full                                                 +---------+---------------+---------+-----------+----------+-------+ GSV      Full                                                 +---------+---------------+---------+-----------+----------+-------+    Final Interpretation: Right: No evidence of common femoral vein obstruction. Left: Ultrasound characteristics of enlarged lymph nodes noted in the groin. There is no evidence of deep vein thrombosis in the lower  extremity.There is no evidence of superficial venous thrombosis.  *See table(s) above for measurements and observations. Electronically signed by Ruta Hinds on 01/28/2018 at 8:49:09 PM.    Assessment & Plan:   Kasch was seen today for hypertension, diabetes and anemia.  Diagnoses and all orders for this visit:  Essential hypertension, benign- His BP is adequately well controlled.  Labs are negative for secondary causes or endorgan damage. -     Basic metabolic panel; Future -     Urinalysis, Routine w reflex microscopic; Future -     TSH; Future  Type II diabetes mellitus with manifestations ([place])- His blood sugar is adequately well controlled. -     Basic metabolic panel; Future -     Hemoglobin A1c; Future -     Microalbumin / creatinine urine  ratio; Future  B12 deficiency- His B12 level is too high.  I have asked him to decrease his B12 supplement by 50%. -     CBC with Differential/Platelet; Future -     Vitamin B12; Future -     Folate; Future   Diarrhea, unspecified type- His thyroid function is normal.  I will screen him for ova and parasites.  I will screen him for celiac disease.  I will screen him for pancreatic insufficiency. -     Fecal lactoferrin, quant; Future -     Ova and parasite examination; Future -     Gliadin antibodies, serum; Future -     Tissue transglutaminase, IgA; Future -     Reticulin Antibody, IgA w reflex titer; Future -     Pancreatic elastase, fecal; Future -     TSH; Future  Erectile dysfunction associated with type 2 diabetes mellitus ([place])   I am having Thomas Lofts "[name]" maintain his B-D SINGLE USE SWABS REGULAR, True Metrix Air Glucose Meter, TRUEplus Lancets 30G, cyanocobalamin, metoprolol succinate, loperamide, losartan, chlorthalidone, atorvastatin, tadalafil, Accu-Chek Aviva Plus, and Coenzyme Q10 (COQ10 PO).  No orders of the defined types were placed in this encounter.    Follow-up: Return in about 3 months (around  08/06/2019).  Scarlette Calico, MD

## 2019-05-06 NOTE — Patient Instructions (Signed)
Chronic Diarrhea Diarrhea is a condition in which a person passes frequent loose and watery stools. It can cause you to feel weak and dehydrated. Dehydration can make you tired and thirsty. It can also cause a dry mouth, decreased urination, and dark yellow urine. Diarrhea is a sign of another underlying problem, such as:  Infection.  Medication side effects.  Dietary intolerance, such as lactose intolerance.  Conditions such as celiac disease, irritable bowel syndrome (IBS), or inflammatory bowel disease (IBD). In most cases, diarrhea lasts 2-3 days. Diarrhea that lasts longer than 4 weeks is called long-lasting (chronic) diarrhea. It is important that you treat your diarrhea as told by your health care provider. Follow these instructions at home: Follow these recommendations as told by your health care provider. Eating and drinking  Take an oral rehydration solution (ORS). This is a drink that is designed to keep you hydrated. It can be found at pharmacies and retail stores.  Drink clear fluids, such as water, ice chips, diluted fruit juice, and low-calorie sports drinks.  Follow the diet recommended by your health care provider. You may need to avoid foods that trigger diarrhea for you.  Avoid foods and beverages that contain a lot of sugar or caffeine.  Avoid alcohol.  Avoid spicy or fatty foods. General instructions      Drink enough fluid to keep your urine clear or pale yellow.  Wash your hands often and after each diarrhea episode. If soap and water are not available, use hand sanitizer.  Make sure that all people in your household wash their hands well and often.  Take over-the-counter and prescription medicines only as told by your health care provider.  If you were prescribed an antibiotic medicine, take it as told by your health care provider. Do not stop taking the antibiotic even if you start to feel better.  Rest at home while you recover.  Watch your  condition for any changes.  Take a warm bath to relieve any burning or pain from frequent diarrhea episodes.  Keep all follow-up visits as told by your health care provider. This is important. Contact a health care provider if:  You have a fever.  Your diarrhea gets worse or does not get better.  You have new symptoms.  You cannot drink fluids without vomiting.  You feel light-headed or dizzy.  You have a headache.  You have muscle cramps.  You have severe pain in the rectum. Get help right away if:  You have persistent vomiting.  You have chest pain.  You feel extremely weak or you faint.  You have bloody or black stools, or stools that look like tar.  You have severe pain, cramping, or bloating in your abdomen, or pain that stays in one place.  You have trouble breathing or you are breathing very quickly.  Your heart is beating very quickly.  Your skin feels cold and clammy.  You feel confused.  You have a severe headache.  You have signs of dehydration, such as: ? Dark urine, very little urine, or no urine. ? Cracked lips. ? Dry mouth. ? Sunken eyes. ? Sleepiness. ? Weakness. Summary  Chronic diarrhea is a condition in which a person passes frequent loose and watery stools for more than 4 weeks.  Diarrhea is a sign of another underlying problem.  Drink enough fluid to keep your urine clear or pale yellow to avoid dehydration.  Wash your hands often and after each diarrhea episode. If soap and water are   not available, use hand sanitizer.  It is important that you treat your diarrhea as told by your health care provider. This information is not intended to replace advice given to you by your health care provider. Make sure you discuss any questions you have with your health care provider. Document Released: 01/27/2004 Document Revised: 09/25/2016 Document Reviewed: 09/25/2016 Elsevier Interactive Patient Education  2019 Elsevier Inc.  

## 2019-05-07 ENCOUNTER — Other Ambulatory Visit: Payer: Medicare HMO

## 2019-05-07 ENCOUNTER — Encounter: Payer: Self-pay | Admitting: Internal Medicine

## 2019-05-07 DIAGNOSIS — R197 Diarrhea, unspecified: Secondary | ICD-10-CM | POA: Diagnosis not present

## 2019-05-07 LAB — TSH: TSH: 1.44 u[IU]/mL (ref 0.35–4.50)

## 2019-05-09 LAB — TISSUE TRANSGLUTAMINASE, IGA: (tTG) Ab, IgA: 1 U/mL

## 2019-05-09 LAB — GLIADIN ANTIBODIES, SERUM
Gliadin IgA: 5 Units
Gliadin IgG: 4 Units

## 2019-05-09 LAB — METHYLMALONIC ACID, SERUM: Methylmalonic Acid, Quant: 139 nmol/L (ref 87–318)

## 2019-05-09 LAB — RETICULIN ANTIBODIES, IGA W TITER: Reticulin IGA Screen: NEGATIVE

## 2019-05-12 ENCOUNTER — Encounter: Payer: Self-pay | Admitting: Internal Medicine

## 2019-05-17 LAB — OVA AND PARASITE EXAMINATION
CONCENTRATE RESULT:: NONE SEEN
MICRO NUMBER:: 579146
SPECIMEN QUALITY:: ADEQUATE
TRICHROME RESULT:: NONE SEEN

## 2019-05-17 LAB — FECAL LACTOFERRIN, QUANT
Fecal Lactoferrin: NEGATIVE
MICRO NUMBER:: 579297
SPECIMEN QUALITY:: ADEQUATE

## 2019-05-17 LAB — PANCREATIC ELASTASE, FECAL: Pancreatic Elastase-1, Stool: 253 mcg/g

## 2019-05-18 ENCOUNTER — Other Ambulatory Visit: Payer: Self-pay | Admitting: Internal Medicine

## 2019-05-18 ENCOUNTER — Encounter: Payer: Self-pay | Admitting: Internal Medicine

## 2019-05-18 DIAGNOSIS — K8681 Exocrine pancreatic insufficiency: Secondary | ICD-10-CM | POA: Insufficient documentation

## 2019-05-18 MED ORDER — PANCRELIPASE (LIP-PROT-AMYL) 36000-114000 UNITS PO CPEP: 36000.0000 [IU] | ORAL_CAPSULE | Freq: Three times a day (TID) | ORAL | 1 refills | Status: DC

## 2019-06-24 ENCOUNTER — Other Ambulatory Visit: Payer: Self-pay | Admitting: Internal Medicine

## 2019-06-24 DIAGNOSIS — I10 Essential (primary) hypertension: Secondary | ICD-10-CM

## 2019-06-24 DIAGNOSIS — E785 Hyperlipidemia, unspecified: Secondary | ICD-10-CM

## 2019-06-24 DIAGNOSIS — E118 Type 2 diabetes mellitus with unspecified complications: Secondary | ICD-10-CM

## 2019-07-24 ENCOUNTER — Other Ambulatory Visit: Payer: Self-pay

## 2019-07-24 ENCOUNTER — Ambulatory Visit (INDEPENDENT_AMBULATORY_CARE_PROVIDER_SITE_OTHER): Payer: Medicare HMO

## 2019-07-24 DIAGNOSIS — Z23 Encounter for immunization: Secondary | ICD-10-CM | POA: Diagnosis not present

## 2019-07-31 DIAGNOSIS — R69 Illness, unspecified: Secondary | ICD-10-CM | POA: Diagnosis not present

## 2019-08-06 ENCOUNTER — Encounter: Payer: Self-pay | Admitting: Internal Medicine

## 2019-08-06 ENCOUNTER — Ambulatory Visit (INDEPENDENT_AMBULATORY_CARE_PROVIDER_SITE_OTHER): Payer: Medicare HMO | Admitting: Internal Medicine

## 2019-08-06 ENCOUNTER — Other Ambulatory Visit (INDEPENDENT_AMBULATORY_CARE_PROVIDER_SITE_OTHER): Payer: Medicare HMO

## 2019-08-06 ENCOUNTER — Other Ambulatory Visit: Payer: Self-pay

## 2019-08-06 VITALS — BP 138/76 | HR 67 | Temp 98.5°F | Resp 16 | Ht 76.0 in | Wt 266.0 lb

## 2019-08-06 DIAGNOSIS — E118 Type 2 diabetes mellitus with unspecified complications: Secondary | ICD-10-CM | POA: Diagnosis not present

## 2019-08-06 DIAGNOSIS — I10 Essential (primary) hypertension: Secondary | ICD-10-CM

## 2019-08-06 DIAGNOSIS — B351 Tinea unguium: Secondary | ICD-10-CM | POA: Insufficient documentation

## 2019-08-06 DIAGNOSIS — D473 Essential (hemorrhagic) thrombocythemia: Secondary | ICD-10-CM

## 2019-08-06 DIAGNOSIS — Z23 Encounter for immunization: Secondary | ICD-10-CM

## 2019-08-06 DIAGNOSIS — E1169 Type 2 diabetes mellitus with other specified complication: Secondary | ICD-10-CM | POA: Diagnosis not present

## 2019-08-06 DIAGNOSIS — D75839 Thrombocytosis, unspecified: Secondary | ICD-10-CM

## 2019-08-06 LAB — CBC WITH DIFFERENTIAL/PLATELET
Basophils Absolute: 0.1 10*3/uL (ref 0.0–0.1)
Basophils Relative: 0.8 % (ref 0.0–3.0)
Eosinophils Absolute: 0.5 10*3/uL (ref 0.0–0.7)
Eosinophils Relative: 4.1 % (ref 0.0–5.0)
HCT: 44.3 % (ref 39.0–52.0)
Hemoglobin: 15.2 g/dL (ref 13.0–17.0)
Lymphocytes Relative: 13 % (ref 12.0–46.0)
Lymphs Abs: 1.5 10*3/uL (ref 0.7–4.0)
MCHC: 34.2 g/dL (ref 30.0–36.0)
MCV: 87.8 fl (ref 78.0–100.0)
Monocytes Absolute: 1.2 10*3/uL — ABNORMAL HIGH (ref 0.1–1.0)
Monocytes Relative: 11.1 % (ref 3.0–12.0)
Neutro Abs: 7.9 10*3/uL — ABNORMAL HIGH (ref 1.4–7.7)
Neutrophils Relative %: 71 % (ref 43.0–77.0)
Platelets: 432 10*3/uL — ABNORMAL HIGH (ref 150.0–400.0)
RBC: 5.04 Mil/uL (ref 4.22–5.81)
RDW: 14.1 % (ref 11.5–15.5)
WBC: 11.2 10*3/uL — ABNORMAL HIGH (ref 4.0–10.5)

## 2019-08-06 LAB — BASIC METABOLIC PANEL
BUN: 25 mg/dL — ABNORMAL HIGH (ref 6–23)
CO2: 29 mEq/L (ref 19–32)
Calcium: 10.3 mg/dL (ref 8.4–10.5)
Chloride: 102 mEq/L (ref 96–112)
Creatinine, Ser: 1.31 mg/dL (ref 0.40–1.50)
GFR: 52.97 mL/min — ABNORMAL LOW (ref >60.00)
Glucose, Bld: 83 mg/dL (ref 70–99)
Potassium: 3.8 mEq/L (ref 3.5–5.1)
Sodium: 141 mEq/L (ref 135–145)

## 2019-08-06 LAB — POCT GLYCOSYLATED HEMOGLOBIN (HGB A1C): Hemoglobin A1C: 6.3 % — AB (ref 4.0–5.6)

## 2019-08-06 MED ORDER — FLUCONAZOLE 200 MG PO TABS: 200.0000 mg | ORAL_TABLET | ORAL | 0 refills | Status: AC

## 2019-08-06 NOTE — Patient Instructions (Signed)
Type 2 Diabetes Mellitus, Diagnosis, Adult Type 2 diabetes (type 2 diabetes mellitus) is a long-term (chronic) disease. In type 2 diabetes, one or both of these problems may be present:  The pancreas does not make enough of a hormone called insulin.  Cells in the body do not respond properly to insulin that the body makes (insulin resistance). Normally, insulin allows blood sugar (glucose) to enter cells in the body. The cells use glucose for energy. Insulin resistance or lack of insulin causes excess glucose to build up in the blood instead of going into cells. As a result, high blood glucose (hyperglycemia) develops. What increases the risk? The following factors may make you more likely to develop type 2 diabetes:  Having a family member with type 2 diabetes.  Being overweight or obese.  Having an inactive (sedentary) lifestyle.  Having been diagnosed with insulin resistance.  Having a history of prediabetes, gestational diabetes, or polycystic ovary syndrome (PCOS).  Being of American-Indian, African-American, Hispanic/Latino, or Asian/Pacific Islander descent. What are the signs or symptoms? In the early stage of this condition, you may not have symptoms. Symptoms develop slowly and may include:  Increased thirst (polydipsia).  Increased hunger(polyphagia).  Increased urination (polyuria).  Increased urination during the night (nocturia).  Unexplained weight loss.  Frequent infections that keep coming back (recurring).  Fatigue.  Weakness.  Vision changes, such as blurry vision.  Cuts or bruises that are slow to heal.  Tingling or numbness in the hands or feet.  Dark patches on the skin (acanthosis nigricans). How is this diagnosed? This condition is diagnosed based on your symptoms, your medical history, a physical exam, and your blood glucose level. Your blood glucose may be checked with one or more of the following blood tests:  A fasting blood glucose (FBG)  test. You will not be allowed to eat (you will fast) for 8 hours or longer before a blood sample is taken.  A random blood glucose test. This test checks blood glucose at any time of day regardless of when you ate.  An A1c (hemoglobin A1c) blood test. This test provides information about blood glucose control over the previous 2-3 months.  An oral glucose tolerance test (OGTT). This test measures your blood glucose at two times: ? After fasting. This is your baseline blood glucose level. ? Two hours after drinking a beverage that contains glucose. You may be diagnosed with type 2 diabetes if:  Your FBG level is 126 mg/dL (7.0 mmol/L) or higher.  Your random blood glucose level is 200 mg/dL (11.1 mmol/L) or higher.  Your A1c level is 6.5% or higher.  Your OGTT result is higher than 200 mg/dL (11.1 mmol/L). These blood tests may be repeated to confirm your diagnosis. How is this treated? Your treatment may be managed by a specialist called an endocrinologist. Type 2 diabetes may be treated by following instructions from your health care provider about:  Making diet and lifestyle changes. This may include: ? Following an individualized nutrition plan that is developed by a diet and nutrition specialist (registered dietitian). ? Exercising regularly. ? Finding ways to manage stress.  Checking your blood glucose level as often as told.  Taking diabetes medicines or insulin daily. This helps to keep your blood glucose levels in the healthy range. ? If you use insulin, you may need to adjust the dosage depending on how physically active you are and what foods you eat. Your health care provider will tell you how to adjust your dosage.    Taking medicines to help prevent complications from diabetes, such as: ? Aspirin. ? Medicine to lower cholesterol. ? Medicine to control blood pressure. Your health care provider will set individualized treatment goals for you. Your goals will be based on  your age, other medical conditions you have, and how you respond to diabetes treatment. Generally, the goal of treatment is to maintain the following blood glucose levels:  Before meals (preprandial): 80-130 mg/dL (4.4-7.2 mmol/L).  After meals (postprandial): below 180 mg/dL (10 mmol/L).  A1c level: less than 7%. Follow these instructions at home: Questions to ask your health care provider  Consider asking the following questions: ? Do I need to meet with a diabetes educator? ? Where can I find a support group for people with diabetes? ? What equipment will I need to manage my diabetes at home? ? What diabetes medicines do I need, and when should I take them? ? How often do I need to check my blood glucose? ? What number can I call if I have questions? ? When is my next appointment? General instructions  Take over-the-counter and prescription medicines only as told by your health care provider.  Keep all follow-up visits as told by your health care provider. This is important.  For more information about diabetes, visit: ? American Diabetes Association (ADA): www.diabetes.org ? American Association of Diabetes Educators (AADE): www.diabeteseducator.org Contact a health care provider if:  Your blood glucose is at or above 240 mg/dL (13.3 mmol/L) for 2 days in a row.  You have been sick or have had a fever for 2 days or longer, and you are not getting better.  You have any of the following problems for more than 6 hours: ? You cannot eat or drink. ? You have nausea and vomiting. ? You have diarrhea. Get help right away if:  Your blood glucose is lower than 54 mg/dL (3.0 mmol/L).  You become confused or you have trouble thinking clearly.  You have difficulty breathing.  You have moderate or large ketone levels in your urine. Summary  Type 2 diabetes (type 2 diabetes mellitus) is a long-term (chronic) disease. In type 2 diabetes, the pancreas does not make enough of a  hormone called insulin, or cells in the body do not respond properly to insulin that the body makes (insulin resistance).  This condition is treated by making diet and lifestyle changes and taking diabetes medicines or insulin.  Your health care provider will set individualized treatment goals for you. Your goals will be based on your age, other medical conditions you have, and how you respond to diabetes treatment.  Keep all follow-up visits as told by your health care provider. This is important. This information is not intended to replace advice given to you by your health care provider. Make sure you discuss any questions you have with your health care provider. Document Released: 11/06/2005 Document Revised: 01/04/2018 Document Reviewed: 12/10/2015 Elsevier Patient Education  2020 [employer] Inc.  

## 2019-08-06 NOTE — Progress Notes (Signed)
Subjective:  Patient ID: Thomas Chung, male    DOB: [birth year]  Age: 78 y.o. MRN: [id number]  CC: Diabetes   HPI Thomas Chung presents for f/up - He complains of weight gain.  He is active and denies any recent episodes of CP or DOE.  He tells me his blood sugars have been well controlled.    ROS Review of Systems  Constitutional: Positive for unexpected weight change (wt gain). Negative for appetite change, fatigue and fever.  HENT: Negative.   Eyes: Negative for visual disturbance.  Respiratory: Negative for cough, chest tightness, shortness of breath and wheezing.   Cardiovascular: Negative for chest pain, palpitations and leg swelling.  Gastrointestinal: Negative for abdominal pain, constipation, diarrhea, nausea and vomiting.  Endocrine: Negative.  Negative for polydipsia, polyphagia and polyuria.  Genitourinary: Negative.  Negative for difficulty urinating.  Musculoskeletal: Negative.  Negative for arthralgias and myalgias.  Skin: Negative.  Negative for color change and pallor.  Neurological: Negative.  Negative for dizziness, weakness and light-headedness.  Hematological: Negative for adenopathy. Does not bruise/bleed easily.  Psychiatric/Behavioral: Negative.    Current Outpatient Medications on File Prior to Visit  Medication Sig Dispense Refill  . ACCU-CHEK AVIVA PLUS test strip USE AS INSTRUCTED TO TEST BLOOD SUGAR DAILY 100 each 3  . Alcohol Swabs (B-D SINGLE USE SWABS REGULAR) PADS Inject 1 Act into the skin See admin instructions. 300 each 2  . atorvastatin (LIPITOR) 40 MG tablet TAKE 1 TABLET EVERY DAY 90 tablet 1  . Blood Glucose Monitoring Suppl (TRUE METRIX AIR GLUCOSE METER) w/Device KIT 1 Act by Does not apply route 2 (two) times daily. 1 kit 3  . chlorthalidone (HYGROTON) 25 MG tablet TAKE 1 TABLET EVERY DAY 90 tablet 1  . Coenzyme Q10 (COQ10 PO)     . cyanocobalamin 1000 MCG tablet Take 1 tablet (1,000 mcg total) by mouth daily. 90 tablet 1   . lipase/protease/amylase (CREON) 36000 UNITS CPEP capsule Take 1 capsule (36,000 Units total) by mouth 3 (three) times daily with meals. 270 capsule 1  . loperamide (IMODIUM) 2 MG capsule Take by mouth as needed for diarrhea or loose stools.    Marland Kitchen losartan (COZAAR) 100 MG tablet TAKE 1 TABLET EVERY DAY 90 tablet 1  . metoprolol succinate (TOPROL-XL) 25 MG 24 hr tablet TAKE 1 TABLET (25 MG TOTAL) BY MOUTH DAILY. TAKE WITH OR IMMEDIATELY FOLLOWING A MEAL. 90 tablet 3  . tadalafil (CIALIS) 5 MG tablet Take 1 tablet (5 mg total) by mouth daily as needed for erectile dysfunction. 90 tablet 1  . TRUEPLUS LANCETS 30G MISC Use to test blood sugar twice daily. E11.8 200 each 3   No current facility-administered medications on file prior to visit.     Objective:  BP 138/76 (BP Location: Left Arm, Patient Position: Sitting, Cuff Size: Large)   Pulse 67   Temp 98.5 F (36.9 C) (Oral)   Resp 16   Ht '6\' 4"'$  (1.93 m)   Wt 266 lb (120.7 kg)   SpO2 97%   BMI 32.38 kg/m   BP Readings from Last 3 Encounters:  08/06/19 138/76  05/06/19 (!) 146/76  01/21/19 118/70    Wt Readings from Last 3 Encounters:  08/06/19 266 lb (120.7 kg)  05/06/19 257 lb (116.6 kg)  01/21/19 250 lb 0.6 oz (113.4 kg)    Physical Exam Vitals signs reviewed.  Constitutional:      Appearance: He is obese. He is not ill-appearing  or diaphoretic.  HENT:     Nose: Nose normal.     Mouth/Throat:     Mouth: Mucous membranes are moist.  Eyes:     General: No scleral icterus.    Conjunctiva/sclera: Conjunctivae normal.  Neck:     Musculoskeletal: Normal range of motion and neck supple. No neck rigidity.  Cardiovascular:     Rate and Rhythm: Normal rate and regular rhythm.     Pulses:          Dorsalis pedis pulses are 1+ on the right side and 1+ on the left side.       Posterior tibial pulses are 1+ on the right side and 1+ on the left side.     Heart sounds: No murmur.  Pulmonary:     Effort: Pulmonary effort is  normal.     Breath sounds: No stridor. No wheezing, rhonchi or rales.  Abdominal:     General: Abdomen is protuberant. Bowel sounds are normal.     Palpations: There is no hepatomegaly or splenomegaly.     Tenderness: There is no abdominal tenderness.  Musculoskeletal: Normal range of motion.     Right lower leg: No edema.     Left lower leg: No edema.  Feet:     Right foot:     Skin integrity: Skin integrity normal. No ulcer, blister, skin breakdown, erythema, warmth, callus, dry skin or fissure.     Toenail Condition: Right toenails are abnormally thick and long. Fungal disease present.    Left foot:     Skin integrity: Skin integrity normal. No ulcer, blister, skin breakdown, erythema, warmth, callus, dry skin or fissure.     Toenail Condition: Left toenails are abnormally thick and long. Fungal disease present. Skin:    General: Skin is warm and dry.     Coloration: Skin is not jaundiced.  Neurological:     General: No focal deficit present.     Mental Status: He is alert and oriented to person, place, and time.     Lab Results  Component Value Date   WBC 11.2 (H) 08/06/2019   HGB 15.2 08/06/2019   HCT 44.3 08/06/2019   PLT 432.0 (H) 08/06/2019   GLUCOSE 83 08/06/2019   CHOL 104 10/29/2018   TRIG 105.0 10/29/2018   HDL 33.50 (L) 10/29/2018   LDLCALC 50 10/29/2018   ALT 18 01/21/2019   AST 18 01/21/2019   NA 141 08/06/2019   K 3.8 08/06/2019   CL 102 08/06/2019   CREATININE 1.31 08/06/2019   BUN 25 (H) 08/06/2019   CO2 29 08/06/2019   TSH 1.44 05/07/2019   PSA 0.07 (L) 01/22/2018   HGBA1C 6.3 (A) 08/06/2019   MICROALBUR 1.7 05/06/2019    Vas [place] Lower Extremity Venous (dvt)  Result Date: 01/28/2018  Lower Venous Study Indication: Pain, Swelling, and Gout. Examination Guidelines: A complete evaluation includes B-mode imaging, spectral doppler, color doppler, and power doppler as needed of all accessible portions of each vessel. Bilateral testing is considered an  integral part of a complete examination. Limited examinations for reoccurring indications may be performed as noted. No prior study on file for comparison.  Right Venous Findings: +---+---------------+---------+-----------+----------+-------+    CompressibilityPhasicitySpontaneityPropertiesSummary +---+---------------+---------+-----------+----------+-------+ CFVFull           Yes      Yes                          +---+---------------+---------+-----------+----------+-------+  Left Venous  Findings: +---------+---------------+---------+-----------+----------+-------+          CompressibilityPhasicitySpontaneityPropertiesSummary +---------+---------------+---------+-----------+----------+-------+ CFV      Full           Yes      Yes                          +---------+---------------+---------+-----------+----------+-------+ FV Prox  Full                                                 +---------+---------------+---------+-----------+----------+-------+ FV Mid   Full                                                 +---------+---------------+---------+-----------+----------+-------+ FV DistalFull                                                 +---------+---------------+---------+-----------+----------+-------+ PFV      Full           Yes      Yes                          +---------+---------------+---------+-----------+----------+-------+ POP      Full           Yes      Yes                          +---------+---------------+---------+-----------+----------+-------+ PTV      Full                                                 +---------+---------------+---------+-----------+----------+-------+ PERO     Full                                                 +---------+---------------+---------+-----------+----------+-------+ GSV      Full                                                  +---------+---------------+---------+-----------+----------+-------+    Final Interpretation: Right: No evidence of common femoral vein obstruction. Left: Ultrasound characteristics of enlarged lymph nodes noted in the groin. There is no evidence of deep vein thrombosis in the lower extremity.There is no evidence of superficial venous thrombosis.  *See table(s) above for measurements and observations. Electronically signed by Ruta Hinds on 01/28/2018 at 8:49:09 PM.     Assessment & Plan:   Thomas Chung was seen today for diabetes.  Diagnoses and all orders for this visit:  Essential hypertension, benign- His blood pressure is adequately well controlled.  Electrolytes and renal function are normal. -     Basic metabolic panel; Future  Thrombocytosis ([place])- His platelet count is stable.  His white cell count is slightly elevated.  He is not anemic and all of his other cell lines are normal.  Will continue to monitor this but at this time I do not see any significant lymphoproliferative disease. 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 -     fluconazole (DIFLUCAN) 200 MG tablet; Take 1 tablet (200 mg total) by mouth once a week. -     HM Diabetes Foot Exam  Type II diabetes mellitus with manifestations ([place])- He has achieved excellent glycemic control. -     POCT glycosylated hemoglobin (Hb A1C)  Need for pneumococcal vaccination -     Pneumococcal polysaccharide vaccine 23-valent greater than or equal to 2yo subcutaneous/IM   I am having Thomas Lofts "[place]" start on fluconazole. I am also having him maintain his B-D SINGLE USE SWABS REGULAR, True Metrix Air Glucose Meter, TRUEplus Lancets 30G, cyanocobalamin, metoprolol succinate, loperamide, tadalafil, Accu-Chek Aviva Plus, Coenzyme Q10 (COQ10 PO), lipase/protease/amylase,  atorvastatin, losartan, and chlorthalidone.  Meds ordered this encounter  Medications  . fluconazole (DIFLUCAN) 200 MG tablet    Sig: Take 1 tablet (200 mg total) by mouth once a week.    Dispense:  12 tablet    Refill:  0     Follow-up: Return in about 6 months (around 02/03/2020).  Scarlette Calico, MD

## 2019-09-15 ENCOUNTER — Other Ambulatory Visit: Payer: Self-pay

## 2019-09-15 DIAGNOSIS — Z20828 Contact with and (suspected) exposure to other viral communicable diseases: Secondary | ICD-10-CM | POA: Diagnosis not present

## 2019-09-15 DIAGNOSIS — Z20822 Contact with and (suspected) exposure to covid-19: Secondary | ICD-10-CM

## 2019-09-16 LAB — NOVEL CORONAVIRUS, NAA: SARS-CoV-2, NAA: NOT DETECTED

## 2019-10-03 ENCOUNTER — Other Ambulatory Visit: Payer: Self-pay | Admitting: Internal Medicine

## 2019-10-03 DIAGNOSIS — K8681 Exocrine pancreatic insufficiency: Secondary | ICD-10-CM

## 2019-10-03 MED ORDER — PANCRELIPASE (LIP-PROT-AMYL) 36000-114000 UNITS PO CPEP: 36000.0000 [IU] | ORAL_CAPSULE | Freq: Three times a day (TID) | ORAL | 1 refills | Status: DC

## 2019-10-08 ENCOUNTER — Encounter: Payer: Self-pay | Admitting: Internal Medicine

## 2019-10-13 ENCOUNTER — Other Ambulatory Visit: Payer: Self-pay | Admitting: Internal Medicine

## 2019-10-25 ENCOUNTER — Encounter: Payer: Self-pay | Admitting: Internal Medicine

## 2019-10-27 DIAGNOSIS — Z20828 Contact with and (suspected) exposure to other viral communicable diseases: Secondary | ICD-10-CM | POA: Diagnosis not present

## 2019-10-30 ENCOUNTER — Encounter: Payer: Self-pay | Admitting: Internal Medicine

## 2019-10-30 ENCOUNTER — Ambulatory Visit: Payer: Self-pay | Admitting: *Deleted

## 2019-10-30 DIAGNOSIS — M7751 Other enthesopathy of right foot: Secondary | ICD-10-CM | POA: Diagnosis not present

## 2019-10-30 DIAGNOSIS — M19071 Primary osteoarthritis, right ankle and foot: Secondary | ICD-10-CM | POA: Diagnosis not present

## 2019-10-30 NOTE — Telephone Encounter (Signed)
Pt called in c/o his right big toe being swollen, red and warm also the top of his foot is swollen.  Also having fever.   Denies pain.   He is being treated for a nail fungus.   "Dr. Ronnald Ramp took a picture of it last time I was in".   I have completed the medication he prescribed for me.    Protocol is to be seen within 4 hours.   I called into Dr. Ronnald Ramp' office and spoke with [facility] and because of his fever they can't see him in the office due to the COVID-19 pandemic.   He needs to go to an urgent care.  I let the pt know this.   He is agreeable to going.    He is going to the [facility].   He asked about sending a picture via MyChart of his toe to Dr. Ronnald Ramp.   I let him know that would be a good idea.  I gave him the help desk number for MyChart in case he needed assistance with sending the picture.  I forwarded my notes to Dr. Ronnald Ramp' office.   Reason for Disposition . [1] SEVERE pain (e.g., excruciating, unable to do any normal activities) AND [2] not improved after 2 hours of pain medicine    History of gout.  Answer Assessment - Initial Assessment Questions 1. ONSET: "When did the pain start?"      Last time I was in and saw Dr. Ronnald Ramp he took a picture of my right toe.   There last 3-4 days my right big toe is thick, swollen.    I have a history of gout it's usually in my left foot but now it seems to be in my left big toe.  My right big toe is red and warm.   The top of my right foot is swollen.    2. LOCATION: "Where is the pain located?"      No pain  Just swelling in the toe and top of my foot. 3. PAIN: "How bad is the pain?"    (Scale 1-10; or mild, moderate, severe)   -  MILD (1-3): doesn't interfere with normal activities    -  MODERATE (4-7): interferes with normal activities (e.g., work or school) or awakens from sleep, limping    -  SEVERE (8-10): excruciating pain, unable to do any normal activities, unable to walk     None 4. WORK OR EXERCISE: "Has there  been any recent work or exercise that involved this part of the body?"      I have a history of gout.  5. CAUSE: "What do you think is causing the foot pain?"     Gout 6. OTHER SYMPTOMS: "Do you have any other symptoms?" (e.g., leg pain, rash, fever, numbness)     I've had a fever over the weekend.   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   I don't know if the fever is from my toe or what.   I've talked with y'all about the COVID exposure already.   My COVID test was negative.  It was done at the [facility]. 7. PREGNANCY: "Is there any chance you are pregnant?" "When was your last menstrual period?"     N/A  Protocols used: FOOT PAIN-A-AH

## 2019-11-07 DIAGNOSIS — L03032 Cellulitis of left toe: Secondary | ICD-10-CM | POA: Diagnosis not present

## 2019-11-07 DIAGNOSIS — M19072 Primary osteoarthritis, left ankle and foot: Secondary | ICD-10-CM | POA: Diagnosis not present

## 2019-11-09 ENCOUNTER — Other Ambulatory Visit: Payer: Self-pay | Admitting: Cardiology

## 2019-11-09 DIAGNOSIS — I10 Essential (primary) hypertension: Secondary | ICD-10-CM

## 2019-11-17 DIAGNOSIS — M25571 Pain in right ankle and joints of right foot: Secondary | ICD-10-CM | POA: Diagnosis not present

## 2019-11-17 DIAGNOSIS — M25774 Osteophyte, right foot: Secondary | ICD-10-CM | POA: Diagnosis not present

## 2019-11-18 DIAGNOSIS — H35033 Hypertensive retinopathy, bilateral: Secondary | ICD-10-CM | POA: Diagnosis not present

## 2019-11-18 DIAGNOSIS — E119 Type 2 diabetes mellitus without complications: Secondary | ICD-10-CM | POA: Diagnosis not present

## 2019-11-18 DIAGNOSIS — Z961 Presence of intraocular lens: Secondary | ICD-10-CM | POA: Diagnosis not present

## 2019-11-18 DIAGNOSIS — H02831 Dermatochalasis of right upper eyelid: Secondary | ICD-10-CM | POA: Diagnosis not present

## 2019-11-18 DIAGNOSIS — H5203 Hypermetropia, bilateral: Secondary | ICD-10-CM | POA: Diagnosis not present

## 2019-11-18 DIAGNOSIS — H524 Presbyopia: Secondary | ICD-10-CM | POA: Diagnosis not present

## 2019-11-24 ENCOUNTER — Encounter: Payer: Self-pay | Admitting: Internal Medicine

## 2019-11-24 ENCOUNTER — Other Ambulatory Visit: Payer: Self-pay | Admitting: Internal Medicine

## 2019-11-24 DIAGNOSIS — K8681 Exocrine pancreatic insufficiency: Secondary | ICD-10-CM

## 2019-11-24 MED ORDER — PANCRELIPASE (LIP-PROT-AMYL) 36000-114000 UNITS PO CPEP: 36000.0000 [IU] | ORAL_CAPSULE | Freq: Three times a day (TID) | ORAL | 1 refills | Status: DC

## 2019-11-27 ENCOUNTER — Ambulatory Visit: Payer: Medicare HMO | Attending: Internal Medicine

## 2019-11-27 DIAGNOSIS — Z20822 Contact with and (suspected) exposure to covid-19: Secondary | ICD-10-CM | POA: Diagnosis not present

## 2019-11-29 LAB — NOVEL CORONAVIRUS, NAA: SARS-CoV-2, NAA: NOT DETECTED

## 2019-11-30 ENCOUNTER — Other Ambulatory Visit: Payer: Self-pay | Admitting: Internal Medicine

## 2019-11-30 DIAGNOSIS — E785 Hyperlipidemia, unspecified: Secondary | ICD-10-CM

## 2019-11-30 DIAGNOSIS — I10 Essential (primary) hypertension: Secondary | ICD-10-CM

## 2019-11-30 DIAGNOSIS — E118 Type 2 diabetes mellitus with unspecified complications: Secondary | ICD-10-CM

## 2019-12-01 DIAGNOSIS — L600 Ingrowing nail: Secondary | ICD-10-CM | POA: Diagnosis not present

## 2019-12-04 LAB — HM DIABETES EYE EXAM

## 2019-12-05 DIAGNOSIS — L603 Nail dystrophy: Secondary | ICD-10-CM | POA: Diagnosis not present

## 2019-12-05 DIAGNOSIS — I739 Peripheral vascular disease, unspecified: Secondary | ICD-10-CM | POA: Diagnosis not present

## 2019-12-05 DIAGNOSIS — E1151 Type 2 diabetes mellitus with diabetic peripheral angiopathy without gangrene: Secondary | ICD-10-CM | POA: Diagnosis not present

## 2019-12-05 DIAGNOSIS — L84 Corns and callosities: Secondary | ICD-10-CM | POA: Diagnosis not present

## 2020-01-30 DIAGNOSIS — M25571 Pain in right ankle and joints of right foot: Secondary | ICD-10-CM | POA: Diagnosis not present

## 2020-02-03 ENCOUNTER — Other Ambulatory Visit: Payer: Self-pay

## 2020-02-03 ENCOUNTER — Ambulatory Visit (INDEPENDENT_AMBULATORY_CARE_PROVIDER_SITE_OTHER): Payer: Medicare HMO | Admitting: Internal Medicine

## 2020-02-03 ENCOUNTER — Encounter: Payer: Self-pay | Admitting: Internal Medicine

## 2020-02-03 VITALS — BP 154/78 | HR 59 | Temp 97.6°F | Resp 16 | Ht 76.0 in | Wt 268.5 lb

## 2020-02-03 DIAGNOSIS — I10 Essential (primary) hypertension: Secondary | ICD-10-CM

## 2020-02-03 DIAGNOSIS — D473 Essential (hemorrhagic) thrombocythemia: Secondary | ICD-10-CM | POA: Diagnosis not present

## 2020-02-03 DIAGNOSIS — E538 Deficiency of other specified B group vitamins: Secondary | ICD-10-CM

## 2020-02-03 DIAGNOSIS — D75839 Thrombocytosis, unspecified: Secondary | ICD-10-CM

## 2020-02-03 DIAGNOSIS — N403 Nodular prostate with lower urinary tract symptoms: Secondary | ICD-10-CM | POA: Diagnosis not present

## 2020-02-03 DIAGNOSIS — N138 Other obstructive and reflux uropathy: Secondary | ICD-10-CM | POA: Diagnosis not present

## 2020-02-03 DIAGNOSIS — E785 Hyperlipidemia, unspecified: Secondary | ICD-10-CM

## 2020-02-03 DIAGNOSIS — Z0000 Encounter for general adult medical examination without abnormal findings: Secondary | ICD-10-CM

## 2020-02-03 DIAGNOSIS — E118 Type 2 diabetes mellitus with unspecified complications: Secondary | ICD-10-CM | POA: Diagnosis not present

## 2020-02-03 LAB — URINALYSIS, ROUTINE W REFLEX MICROSCOPIC
Bilirubin Urine: NEGATIVE
Hgb urine dipstick: NEGATIVE
Ketones, ur: NEGATIVE
Leukocytes,Ua: NEGATIVE
Nitrite: NEGATIVE
RBC / HPF: NONE SEEN (ref 0–?)
Specific Gravity, Urine: 1.02 (ref 1.000–1.030)
Total Protein, Urine: NEGATIVE
Urine Glucose: NEGATIVE
Urobilinogen, UA: 0.2 (ref 0.0–1.0)
WBC, UA: NONE SEEN (ref 0–?)
pH: 6.5 (ref 5.0–8.0)

## 2020-02-03 LAB — CBC WITH DIFFERENTIAL/PLATELET
Basophils Absolute: 0.1 10*3/uL (ref 0.0–0.1)
Basophils Relative: 0.8 % (ref 0.0–3.0)
Eosinophils Absolute: 0.5 10*3/uL (ref 0.0–0.7)
Eosinophils Relative: 4.5 % (ref 0.0–5.0)
HCT: 46.3 % (ref 39.0–52.0)
Hemoglobin: 15.4 g/dL (ref 13.0–17.0)
Lymphocytes Relative: 15.1 % (ref 12.0–46.0)
Lymphs Abs: 1.6 10*3/uL (ref 0.7–4.0)
MCHC: 33.2 g/dL (ref 30.0–36.0)
MCV: 87.4 fl (ref 78.0–100.0)
Monocytes Absolute: 1.1 10*3/uL — ABNORMAL HIGH (ref 0.1–1.0)
Monocytes Relative: 10.3 % (ref 3.0–12.0)
Neutro Abs: 7.5 10*3/uL (ref 1.4–7.7)
Neutrophils Relative %: 69.3 % (ref 43.0–77.0)
Platelets: 477 10*3/uL — ABNORMAL HIGH (ref 150.0–400.0)
RBC: 5.3 Mil/uL (ref 4.22–5.81)
RDW: 14.3 % (ref 11.5–15.5)
WBC: 10.8 10*3/uL — ABNORMAL HIGH (ref 4.0–10.5)

## 2020-02-03 LAB — LIPID PANEL
Cholesterol: 117 mg/dL (ref 0–200)
HDL: 34.2 mg/dL — ABNORMAL LOW (ref >39.00)
LDL Cholesterol: 50 mg/dL (ref 0–99)
NonHDL: 83.14
Total CHOL/HDL Ratio: 3
Triglycerides: 168 mg/dL — ABNORMAL HIGH (ref 0.0–149.0)
VLDL: 33.6 mg/dL (ref 0.0–40.0)

## 2020-02-03 LAB — BASIC METABOLIC PANEL
BUN: 35 mg/dL — ABNORMAL HIGH (ref 6–23)
CO2: 32 mEq/L (ref 19–32)
Calcium: 10.4 mg/dL (ref 8.4–10.5)
Chloride: 98 mEq/L (ref 96–112)
Creatinine, Ser: 1.18 mg/dL (ref 0.40–1.50)
GFR: 59.68 mL/min — ABNORMAL LOW (ref >60.00)
Glucose, Bld: 88 mg/dL (ref 70–99)
Potassium: 4.8 mEq/L (ref 3.5–5.1)
Sodium: 138 mEq/L (ref 135–145)

## 2020-02-03 LAB — TSH: TSH: 1.46 u[IU]/mL (ref 0.35–4.50)

## 2020-02-03 LAB — MICROALBUMIN / CREATININE URINE RATIO
Creatinine,U: 54.8 mg/dL
Microalb Creat Ratio: 2.3 mg/g (ref 0.0–30.0)
Microalb, Ur: 1.3 mg/dL (ref 0.0–1.9)

## 2020-02-03 LAB — PSA: PSA: 0.09 ng/mL — ABNORMAL LOW (ref 0.10–4.00)

## 2020-02-03 LAB — IBC PANEL
Iron: 70 ug/dL (ref 42–165)
Saturation Ratios: 15.7 % — ABNORMAL LOW (ref 20.0–50.0)
Transferrin: 319 mg/dL (ref 212.0–360.0)

## 2020-02-03 LAB — VITAMIN B12: Vitamin B-12: 398 pg/mL (ref 211–911)

## 2020-02-03 LAB — HEMOGLOBIN A1C: Hgb A1c MFr Bld: 6.5 % (ref 4.6–6.5)

## 2020-02-03 LAB — HEPATIC FUNCTION PANEL
ALT: 26 U/L (ref 0–53)
AST: 23 U/L (ref 0–37)
Albumin: 4.5 g/dL (ref 3.5–5.2)
Alkaline Phosphatase: 60 U/L (ref 39–117)
Bilirubin, Direct: 0.1 mg/dL (ref 0.0–0.3)
Total Bilirubin: 0.7 mg/dL (ref 0.2–1.2)
Total Protein: 6.9 g/dL (ref 6.0–8.3)

## 2020-02-03 LAB — FERRITIN: Ferritin: 12.8 ng/mL — ABNORMAL LOW (ref 22.0–322.0)

## 2020-02-03 LAB — FOLATE: Folate: >23.7 ng/mL (ref >5.9)

## 2020-02-03 NOTE — Progress Notes (Signed)
Subjective:  Patient ID: Thomas Chung, male    DOB: 08/11/1941  Age: 79 y.o. MRN: [id number]  CC: Hyperlipidemia, Diabetes, and Annual Exam  This visit occurred during the SARS-CoV-2 public health emergency.  Safety protocols were in place, including screening questions prior to the visit, additional usage of staff PPE, and extensive cleaning of exam room while observing appropriate contact time as indicated for disinfecting solutions.    HPI Thomas Chung presents for a CPX.  He is very active with a walking and swimming regimen.  His exertion is good and he denies any recent episodes of CP, DOE, palpitations, edema, or fatigue.  He does complain of weight gain but tells me his blood sugars have been well controlled.  Outpatient Medications Prior to Visit  Medication Sig Dispense Refill  . ACCU-CHEK AVIVA PLUS test strip USE AS INSTRUCTED TO TEST BLOOD SUGAR DAILY 100 each 3  . Alcohol Swabs (B-D SINGLE USE SWABS REGULAR) PADS Inject 1 Act into the skin See admin instructions. 300 each 2  . atorvastatin (LIPITOR) 40 MG tablet TAKE 1 TABLET EVERY DAY 90 tablet 0  . Blood Glucose Monitoring Suppl (TRUE METRIX AIR GLUCOSE METER) w/Device KIT 1 Act by Does not apply route 2 (two) times daily. 1 kit 3  . chlorthalidone (HYGROTON) 25 MG tablet TAKE 1 TABLET EVERY DAY 90 tablet 0  . Coenzyme Q10 (COQ10 PO)     . cyanocobalamin 1000 MCG tablet Take 1 tablet (1,000 mcg total) by mouth daily. 90 tablet 1  . lipase/protease/amylase (CREON) 36000 UNITS CPEP capsule Take 1 capsule (36,000 Units total) by mouth 3 (three) times daily with meals. 270 capsule 1  . loperamide (IMODIUM) 2 MG capsule Take by mouth as needed for diarrhea or loose stools.    Marland Kitchen losartan (COZAAR) 100 MG tablet TAKE 1 TABLET EVERY DAY 90 tablet 0  . metoprolol succinate (TOPROL-XL) 25 MG 24 hr tablet TAKE 1 TABLET DAILY. TAKE WITH OR IMMEDIATELY FOLLOWING A MEAL. 90 tablet 3  . tadalafil (CIALIS) 5 MG tablet Take 1 tablet  (5 mg total) by mouth daily as needed for erectile dysfunction. 90 tablet 1  . TRUEPLUS LANCETS 30G MISC Use to test blood sugar twice daily. E11.8 200 each 3   No facility-administered medications prior to visit.    ROS Review of Systems  Constitutional: Positive for unexpected weight change (wt gain). Negative for appetite change, chills, diaphoresis and fatigue.  HENT: Negative for sore throat.   Eyes: Negative.   Respiratory: Negative for cough, chest tightness, shortness of breath and wheezing.   Cardiovascular: Negative for chest pain, palpitations and leg swelling.  Gastrointestinal: Negative for abdominal pain, blood in stool, constipation, diarrhea, nausea and vomiting.  Endocrine: Negative.  Negative for cold intolerance, heat intolerance, polydipsia, polyphagia and polyuria.  Genitourinary: Negative.  Negative for difficulty urinating, dysuria, scrotal swelling, testicular pain and urgency.  Musculoskeletal: Negative for arthralgias and myalgias.  Skin: Negative.  Negative for color change.  Neurological: Negative.  Negative for dizziness, weakness and light-headedness.  Hematological: Negative.   Psychiatric/Behavioral: Negative.     Objective:  BP (!) 154/78 (BP Location: Left Arm, Patient Position: Sitting, Cuff Size: Large)   Pulse (!) 59   Temp 97.6 F (36.4 C) (Oral)   Resp 16   Ht '6\' 4"'$  (1.93 m)   Wt 268 lb 8 oz (121.8 kg)   SpO2 99%   BMI 32.68 kg/m   BP Readings from Last 3 Encounters:  02/03/20 Marland Kitchen)  154/78  08/06/19 138/76  05/06/19 (!) 146/76    Wt Readings from Last 3 Encounters:  02/03/20 268 lb 8 oz (121.8 kg)  08/06/19 266 lb (120.7 kg)  05/06/19 257 lb (116.6 kg)    Physical Exam Vitals reviewed.  Constitutional:      Appearance: Normal appearance.  HENT:     Nose: Nose normal.     Mouth/Throat:     Mouth: Mucous membranes are moist.  Eyes:     General: No scleral icterus.    Conjunctiva/sclera: Conjunctivae normal.  Cardiovascular:      Rate and Rhythm: Normal rate and regular rhythm.     Heart sounds: No murmur.     Comments: EKG - NSR  Normal EKG Pulmonary:     Effort: Pulmonary effort is normal.     Breath sounds: No stridor. No wheezing, rhonchi or rales.  Abdominal:     General: Abdomen is protuberant. Bowel sounds are normal. There is no distension.     Palpations: Abdomen is soft. There is no fluid wave, hepatomegaly, splenomegaly or mass.     Tenderness: There is no abdominal tenderness.     Hernia: No hernia is present.  Genitourinary:    Comments: deferred Musculoskeletal:     Cervical back: Neck supple.     Right lower leg: No edema.     Left lower leg: No edema.  Lymphadenopathy:     Cervical: No cervical adenopathy.  Skin:    General: Skin is warm and dry.  Neurological:     General: No focal deficit present.     Mental Status: He is alert.  Psychiatric:        Mood and Affect: Mood normal.        Behavior: Behavior normal.     Lab Results  Component Value Date   WBC 10.8 (H) 02/03/2020   HGB 15.4 02/03/2020   HCT 46.3 02/03/2020   PLT 477.0 (H) 02/03/2020   GLUCOSE 88 02/03/2020   CHOL 117 02/03/2020   TRIG 168.0 (H) 02/03/2020   HDL 34.20 (L) 02/03/2020   LDLCALC 50 02/03/2020   ALT 26 02/03/2020   AST 23 02/03/2020   NA 138 02/03/2020   K 4.8 02/03/2020   CL 98 02/03/2020   CREATININE 1.18 02/03/2020   BUN 35 (H) 02/03/2020   CO2 32 02/03/2020   TSH 1.46 02/03/2020   PSA 0.09 (L) 02/03/2020   HGBA1C 6.5 02/03/2020   MICROALBUR 1.3 02/03/2020    VAS [place] LOWER EXTREMITY VENOUS (DVT)  Result Date: 01/28/2018  Lower Venous Study Indication: Pain, Swelling, and Gout. Examination Guidelines: A complete evaluation includes B-mode imaging, spectral doppler, color doppler, and power doppler as needed of all accessible portions of each vessel. Bilateral testing is considered an integral part of a complete examination. Limited examinations for reoccurring indications may be  performed as noted. No prior study on file for comparison.  Right Venous Findings: +---+---------------+---------+-----------+----------+-------+    CompressibilityPhasicitySpontaneityPropertiesSummary +---+---------------+---------+-----------+----------+-------+ CFVFull           Yes      Yes                          +---+---------------+---------+-----------+----------+-------+  Left Venous Findings: +---------+---------------+---------+-----------+----------+-------+          CompressibilityPhasicitySpontaneityPropertiesSummary +---------+---------------+---------+-----------+----------+-------+ CFV      Full           Yes      Yes                          +---------+---------------+---------+-----------+----------+-------+  FV Prox  Full                                                 +---------+---------------+---------+-----------+----------+-------+ FV Mid   Full                                                 +---------+---------------+---------+-----------+----------+-------+ FV DistalFull                                                 +---------+---------------+---------+-----------+----------+-------+ PFV      Full           Yes      Yes                          +---------+---------------+---------+-----------+----------+-------+ POP      Full           Yes      Yes                          +---------+---------------+---------+-----------+----------+-------+ PTV      Full                                                 +---------+---------------+---------+-----------+----------+-------+ PERO     Full                                                 +---------+---------------+---------+-----------+----------+-------+ GSV      Full                                                 +---------+---------------+---------+-----------+----------+-------+    Final Interpretation: Right: No evidence of common femoral vein obstruction.  Left: Ultrasound characteristics of enlarged lymph nodes noted in the groin. There is no evidence of deep vein thrombosis in the lower extremity.There is no evidence of superficial venous thrombosis.  *See table(s) above for measurements and observations. Electronically signed by Ruta Hinds on 01/28/2018 at 8:49:09 PM.     Assessment & Plan:   Lorenzo was seen today for hyperlipidemia, diabetes and annual exam.  Diagnoses and all orders for this visit:  Essential hypertension, benign- His blood pressure is adequately well controlled.  EKG is negative for LVH or ischemia.  Electrolytes and renal function are normal. -     TSH -     Urinalysis, Routine w reflex microscopic -     EKG 12-Lead  Type II diabetes mellitus with manifestations ([place])- His A1c is at 6.5%.  His blood sugars are adequately well controlled. -     Basic metabolic panel -     Hemoglobin A1c -  Microalbumin / creatinine urine ratio -     HM Diabetes Foot Exam  Prostate nodule with urinary obstruction- His PSA remains low which is a reassuring sign that he does not have prostate cancer.  He has no symptoms that need to be treated. -     PSA  Thrombocytosis ([place])- He has a mildly elevated white cell count and platelet count.  He is not anemic and his other cell lines are normal.  This is consistent with a benign pathology at this time.  Will continue to monitor it. -     CBC with Differential/Platelet -     Vitamin B12 -     IBC panel -     Folate -     Ferritin  B12 deficiency- Will continue oral, high-dose B12 supplementation. -     Folate  Hyperlipidemia with target LDL less than 100- He has achieved his LDL goal is doing well on the statin. -     Lipid panel -     Hepatic function panel -     TSH  Routine general medical examination at a health care facility- Exam completed, labs reviewed, vaccines reviewed, no cancer screenings are indicated, patient education was given.   I am having Thomas Chung "[name]" maintain his B-D SINGLE USE SWABS REGULAR, True Metrix Air Glucose Meter, TRUEplus Lancets 30G, cyanocobalamin, loperamide, tadalafil, Accu-Chek Aviva Plus, Coenzyme Q10 (COQ10 PO), metoprolol succinate, lipase/protease/amylase, atorvastatin, chlorthalidone, and losartan.  No orders of the defined types were placed in this encounter.    Follow-up: Return in about 6 months (around 08/05/2020).  Scarlette Calico, MD

## 2020-02-03 NOTE — Patient Instructions (Signed)

## 2020-02-04 ENCOUNTER — Encounter: Payer: Self-pay | Admitting: Internal Medicine

## 2020-02-05 MED ORDER — CYANOCOBALAMIN 1000 MCG PO TABS: 1000.0000 ug | ORAL_TABLET | Freq: Every day | ORAL | 1 refills | Status: AC

## 2020-02-13 ENCOUNTER — Other Ambulatory Visit: Payer: Self-pay | Admitting: Internal Medicine

## 2020-02-13 DIAGNOSIS — I10 Essential (primary) hypertension: Secondary | ICD-10-CM

## 2020-02-13 DIAGNOSIS — E118 Type 2 diabetes mellitus with unspecified complications: Secondary | ICD-10-CM

## 2020-02-13 DIAGNOSIS — E785 Hyperlipidemia, unspecified: Secondary | ICD-10-CM

## 2020-02-18 ENCOUNTER — Telehealth: Payer: Self-pay | Admitting: Internal Medicine

## 2020-02-18 NOTE — Telephone Encounter (Signed)
    Patient calling to discuss lipase/protease/amylase (CREON) 36000 UNITS CPEP capsule. Patient states he dropped off a pharmaceutical form over a week ago for completion. Has the form been completed and faxed?

## 2020-02-19 NOTE — Telephone Encounter (Signed)
Patient states the he has a few other things to send with it. States that he could pick these forms up and mail the hard copies. Would like a call once ready for pick up so he can send them through the mail.

## 2020-02-19 NOTE — Telephone Encounter (Signed)
I have this and will refax it. I faxed it around the time the fax machine was not working.

## 2020-02-19 NOTE — Telephone Encounter (Signed)
I only have the one form - it is ready whenever he is.

## 2020-03-11 ENCOUNTER — Other Ambulatory Visit: Payer: Self-pay | Admitting: Internal Medicine

## 2020-03-12 ENCOUNTER — Encounter: Payer: Self-pay | Admitting: Internal Medicine

## 2020-03-12 DIAGNOSIS — K8681 Exocrine pancreatic insufficiency: Secondary | ICD-10-CM

## 2020-03-18 DIAGNOSIS — M2042 Other hammer toe(s) (acquired), left foot: Secondary | ICD-10-CM | POA: Diagnosis not present

## 2020-03-18 DIAGNOSIS — L03032 Cellulitis of left toe: Secondary | ICD-10-CM | POA: Diagnosis not present

## 2020-03-22 MED ORDER — PANCRELIPASE (LIP-PROT-AMYL) 36000-114000 UNITS PO CPEP: 36000.0000 [IU] | ORAL_CAPSULE | Freq: Three times a day (TID) | ORAL | 3 refills | Status: DC

## 2020-03-22 MED ORDER — ACCU-CHEK SOFTCLIX LANCETS MISC: 3 refills | Status: DC

## 2020-03-26 ENCOUNTER — Other Ambulatory Visit: Payer: Self-pay | Admitting: Internal Medicine

## 2020-03-26 DIAGNOSIS — E118 Type 2 diabetes mellitus with unspecified complications: Secondary | ICD-10-CM

## 2020-03-26 MED ORDER — ACCU-CHEK SOFTCLIX LANCETS MISC: 1 refills | Status: DC

## 2020-03-30 ENCOUNTER — Telehealth: Payer: Self-pay | Admitting: Internal Medicine

## 2020-03-30 DIAGNOSIS — I10 Essential (primary) hypertension: Secondary | ICD-10-CM

## 2020-03-30 NOTE — Progress Notes (Signed)
  Chronic Care Management   Outreach Note  03/30/2020 Name: AGNES WALLEN MRN: [id number] DOB: 07-May-1941  Referred by: Janith Lima, MD Reason for referral : No chief complaint on file.   An unsuccessful telephone outreach was attempted today. The patient was referred to the pharmacist for assistance with care management and care coordination.   This note is not being shared with the patient for the following reason: To respect privacy (The patient or proxy has requested that the information not be shared).  Follow Up Plan:   Earney [place] Upstream Scheduler

## 2020-03-30 NOTE — Progress Notes (Signed)
  Chronic Care Management   Note  03/30/2020 Name: Thomas Chung MRN: [id number] DOB: 11-11-1941  Thomas Chung is a 79 y.o. year old male who is a primary care patient of Janith Lima, MD. I reached out to [name] by phone today in response to a referral sent by Thomas Chung PCP, Janith Lima, MD.   Thomas Chung was given information about Chronic Care Management services today including:  1. CCM service includes personalized support from designated clinical staff supervised by his physician, including individualized plan of care and coordination with other care providers 2. 24/7 contact phone numbers for assistance for urgent and routine care needs. 3. Service will only be billed when office clinical staff spend 20 minutes or more in a month to coordinate care. 4. Only one practitioner may furnish and bill the service in a calendar month. 5. The patient may stop CCM services at any time (effective at the end of the month) by phone call to the office staff.   Patient agreed to services and verbal consent obtained.   This note is not being shared with the patient for the following reason: To respect privacy (The patient or proxy has requested that the information not be shared). Follow up plan:   Earney Hamburg Upstream Scheduler

## 2020-04-01 DIAGNOSIS — M12272 Villonodular synovitis (pigmented), left ankle and foot: Secondary | ICD-10-CM | POA: Diagnosis not present

## 2020-05-18 DIAGNOSIS — Z85828 Personal history of other malignant neoplasm of skin: Secondary | ICD-10-CM | POA: Diagnosis not present

## 2020-05-18 DIAGNOSIS — L918 Other hypertrophic disorders of the skin: Secondary | ICD-10-CM | POA: Diagnosis not present

## 2020-05-18 DIAGNOSIS — D225 Melanocytic nevi of trunk: Secondary | ICD-10-CM | POA: Diagnosis not present

## 2020-05-18 DIAGNOSIS — D692 Other nonthrombocytopenic purpura: Secondary | ICD-10-CM | POA: Diagnosis not present

## 2020-05-18 DIAGNOSIS — Z85820 Personal history of malignant melanoma of skin: Secondary | ICD-10-CM | POA: Diagnosis not present

## 2020-05-18 DIAGNOSIS — L812 Freckles: Secondary | ICD-10-CM | POA: Diagnosis not present

## 2020-05-18 DIAGNOSIS — L821 Other seborrheic keratosis: Secondary | ICD-10-CM | POA: Diagnosis not present

## 2020-05-18 DIAGNOSIS — L570 Actinic keratosis: Secondary | ICD-10-CM | POA: Diagnosis not present

## 2020-05-21 NOTE — Chronic Care Management (AMB) (Deleted)
Chronic Care Management Pharmacy  Name: Thomas Chung  MRN: [id number] DOB: 11-02-1941   Chief Complaint/ HPI  Thomas Chung,  79 y.o. , male presents for their Initial CCM visit with the clinical pharmacist via telephone due to COVID-19 Pandemic.  PCP : Thomas Lima, MD Patient Care Team: Thomas Lima, MD as PCP - General Thomas [name], MD as PCP - Cardiology (Cardiology) Danella Sensing, MD as Consulting Physician (Dermatology) Gatha Mayer, MD as Consulting Physician (Gastroenterology) Calvert Cantor, MD as Consulting Physician (Ophthalmology) Chelsea Aus, DDS as Consulting Physician (Dentistry) Jarome Matin, MD as Consulting Physician (Dermatology) Charlton Haws, [facility] as Pharmacist (Pharmacist)  Their chronic conditions include: Hypertension, Hyperlipidemia, Diabetes, GERD and Exocrine pancreatic insuffiency   Office Visits: 02/03/20: Patient presented to Dr. Ronnald Ramp for follow-up. BP in clinic 153/78, patient states home Bps in the 160s. No medication changes made.   Consult Visit: 03/18/20: Patient presented to Dr. Barkley Bruns (Podiatry)  01/30/20: Patient presented to Dr. Barkley Bruns (Podiatry)  12/05/19: Patient presented to Dr. Barkley Bruns (Podiatry)  12/01/19: Patient presented to Dr. Barkley Bruns (Podiatry)    Allergies  Allergen Reactions  . Amlodipine Swelling  . Prevnar [Pneumococcal 13-Val Conj Vacc]     Joint pain, swelling    Medications: Outpatient Encounter Medications as of 05/25/2020  Medication Sig  . ACCU-CHEK AVIVA PLUS test strip USE AS INSTRUCTED TO TEST BLOOD SUGAR DAILY  . Accu-Chek Softclix Lancets lancets Use to check blood sugar daily. DX: E11.8  . Alcohol Swabs (B-D SINGLE USE SWABS REGULAR) PADS Inject 1 Act into the skin See admin instructions.  Marland Kitchen atorvastatin (LIPITOR) 40 MG tablet TAKE 1 TABLET EVERY DAY  . Blood Glucose Monitoring Suppl (TRUE METRIX AIR GLUCOSE METER) w/Device KIT 1 Act by Does not apply route 2 (two) times daily.  .  chlorthalidone (HYGROTON) 25 MG tablet TAKE 1 TABLET EVERY DAY  . Coenzyme Q10 (COQ10 PO)   . cyanocobalamin 1000 MCG tablet Take 1 tablet (1,000 mcg total) by mouth daily.  . lipase/protease/amylase (CREON) 36000 UNITS CPEP capsule Take 1 capsule (36,000 Units total) by mouth 3 (three) times daily with meals.  Marland Kitchen loperamide (IMODIUM) 2 MG capsule Take by mouth as needed for diarrhea or loose stools.  Marland Kitchen losartan (COZAAR) 100 MG tablet TAKE 1 TABLET EVERY DAY  . metoprolol succinate (TOPROL-XL) 25 MG 24 hr tablet TAKE 1 TABLET DAILY. TAKE WITH OR IMMEDIATELY FOLLOWING A MEAL.  . tadalafil (CIALIS) 5 MG tablet Take 1 tablet (5 mg total) by mouth daily as needed for erectile dysfunction.  . [DISCONTINUED] atorvastatin (LIPITOR) 40 MG tablet TAKE 1 TABLET EVERY DAY  . [DISCONTINUED] chlorthalidone (HYGROTON) 25 MG tablet TAKE 1 TABLET EVERY DAY  . [DISCONTINUED] losartan (COZAAR) 100 MG tablet TAKE 1 TABLET EVERY DAY   No facility-administered encounter medications on file as of 05/25/2020.     Current Diagnosis/Assessment:    Goals Addressed   None    Diabetes   A1c goal {A1c goals:23924}  Recent Relevant Labs: Lab Results  Component Value Date/Time   HGBA1C 6.5 02/03/2020 01:56 PM   HGBA1C 6.3 (A) 08/06/2019 02:03 PM   HGBA1C 6.5 05/06/2019 01:41 PM   GFR 59.68 (L) 02/03/2020 01:56 PM   GFR 52.97 (L) 08/06/2019 02:17 PM   MICROALBUR 1.3 02/03/2020 01:56 PM   MICROALBUR 1.7 05/06/2019 01:41 PM    Last diabetic Eye exam:  Lab Results  Component Value Date/Time   HMDIABEYEEXA No Retinopathy 11/28/2018 12:00 AM  Last diabetic Foot exam:  Lab Results  Component Value Date/Time   HMDIABFOOTEX done 08/07/2013 12:00 AM     Checking BG: {CHL HP Blood Glucose Monitoring Frequency:[phone]}  Recent FBG Readings: *** Recent pre-meal BG readings: *** Recent 2hr PP BG readings:  *** Recent HS BG readings: ***  Patient has failed these meds in past: *** Patient is currently  {CHL Controlled/Uncontrolled:[phone]} on the following medications: . None   We discussed: {CHL HP Upstream Pharmacy discussion:[phone]}  Plan  Continue {CHL HP Upstream Pharmacy [phone]}  Hypertension   BP goal is:  {CHL HP UPSTREAM Pharmacist BP [phone]}  Office blood pressures are  BP Readings from Last 3 Encounters:  02/03/20 (!) 154/78  08/06/19 138/76  05/06/19 (!) 146/76   CMP Latest Ref Rng & Units 02/03/2020 08/06/2019 05/06/2019  Glucose 70 - 99 mg/dL 88 83 93  BUN 6 - 23 mg/dL 35(H) 25(H) 28(H)  Creatinine 0.40 - 1.50 mg/dL 1.18 1.31 1.18  Sodium 135 - 145 mEq/L 138 141 138  Potassium 3.5 - 5.1 mEq/L 4.8 3.8 4.0  Chloride 96 - 112 mEq/L 98 102 101  CO2 19 - 32 mEq/L 32 29 27  Calcium 8.4 - 10.5 mg/dL 10.4 10.3 10.1  Total Protein 6.0 - 8.3 g/dL 6.9 - -  Total Bilirubin 0.2 - 1.2 mg/dL 0.7 - -  Alkaline Phos 39 - 117 U/L 60 - -  AST 0 - 37 U/L 23 - -  ALT 0 - 53 U/L 26 - -     Patient checks BP at home {CHL HP BP Monitoring Frequency:[phone]} Patient home BP readings are ranging: ***  Patient has failed these meds in the past: Amlodipine, azilsartan, HCTZ, itemization, valsartan Patient is currently {CHL Controlled/Uncontrolled:[phone]} on the following medications:  . Chlorthalidone 25 mg daily  . Losartan 100 mg daily  . Metoprolol XL 25 mg daily   We discussed {CHL HP Upstream Pharmacy discussion:[phone]}  Plan  Continue {CHL HP Upstream Pharmacy [phone]}   Hyperlipidemia   LDL goal < ***  Lipid Panel     Component Value Date/Time   CHOL 117 02/03/2020 1356   TRIG 168.0 (H) 02/03/2020 1356   HDL 34.20 (L) 02/03/2020 1356   LDLCALC 50 02/03/2020 1356    Hepatic Function Latest Ref Rng & Units 02/03/2020 01/21/2019 01/22/2018  Total Protein 6.0 - 8.3 g/dL 6.9 7.0 6.8  Albumin 3.5 - 5.2 g/dL 4.5 4.6 4.4  AST 0 - 37 U/L '23 18 17  '$ ALT 0 - 53 U/L '26 18 18  '$ Alk Phosphatase 39 - 117 U/L 60 54 61  Total  Bilirubin 0.2 - 1.2 mg/dL 0.7 0.7 0.9  Bilirubin, Direct 0.0 - 0.3 mg/dL 0.1 - -     The ASCVD Risk score (Sharkey., et al., 2013) failed to calculate for the following reasons:   The valid total cholesterol range is 130 to 320 mg/dL   Patient has failed these meds in past: *** Patient is currently {CHL Controlled/Uncontrolled:[phone]} on the following medications:  . Atorvastatin 40 mg daily   We discussed:  {CHL HP Upstream Pharmacy discussion:[phone]}  Plan  Continue {CHL HP Upstream Pharmacy KXFGH:[id number]}  Pancreatic insufficiency    Patient has failed these meds in past: *** Patient is currently {CHL Controlled/Uncontrolled:[phone]} on the following medications:  Creon 36,000 units TID  We discussed:  ***  03/12/20: working on getting PAP approved, patient in [place] [employer] /  OTC    Patient has failed these meds in past: *** Patient is currently {CHL Controlled/Uncontrolled:[phone]} on the following medications:  . Coq10  . Vitamin B12 1000 mcg daily  . Loperamide 2 mg PRN  . Tadalafil 5 mg daily PRN  We discussed:  ***  Plan  Continue {CHL HP Upstream Pharmacy OIZTI:[id number]}  Vaccines   Reviewed and discussed patient's vaccination history.    Immunization History  Administered Date(s) Administered  . Fluad Quad(high Dose 65+) 07/24/2019  . Influenza Split 08/29/2011, 08/12/2012  . Influenza Whole 08/17/2009, 08/17/2010  . Influenza, High Dose Seasonal PF 07/23/2015, 08/09/2016, 07/24/2017, 07/23/2018  . Influenza,inj,Quad PF,6+ Mos 08/07/2013, 07/29/2014  . Pneumococcal Conjugate-13 04/09/2014  . Pneumococcal Polysaccharide-23 08/17/2010, 08/06/2019  . Td 04/29/2010  . Zoster 08/07/2013    Plan  Recommended patient receive *** vaccine in *** office.   Medication Management   Pt uses [place] pharmacy for all medications Uses pill box? {Yes or If no, why  not?:20788} Pt endorses ***% compliance  We discussed: ***  Plan  {US Pharmacy SNKN:39767}    Follow up: *** month phone visit  ***

## 2020-05-25 ENCOUNTER — Other Ambulatory Visit: Payer: Self-pay

## 2020-05-25 ENCOUNTER — Ambulatory Visit: Payer: Medicare HMO | Admitting: Pharmacist

## 2020-05-25 DIAGNOSIS — K8681 Exocrine pancreatic insufficiency: Secondary | ICD-10-CM

## 2020-05-25 DIAGNOSIS — E785 Hyperlipidemia, unspecified: Secondary | ICD-10-CM

## 2020-05-25 DIAGNOSIS — I10 Essential (primary) hypertension: Secondary | ICD-10-CM

## 2020-05-25 NOTE — Addendum Note (Signed)
Addended by: Aviva Signs M on: 05/25/2020 03:13 PM   Modules accepted: Orders

## 2020-05-25 NOTE — Patient Instructions (Addendum)
Visit Information  Phone number for Pharmacist: [phone]  Thank you for meeting with me to discuss your medications! I look forward to working with you to achieve your health care goals. Below is a summary of what we talked about during the visit:  Goals Addressed            This Visit's Progress   . Pharmacy Care Plan       CARE PLAN ENTRY (see longitudinal plan of care for additional care plan information)  Current Barriers:  . Chronic Disease Management support, education, and care coordination needs related to Hypertension, Hyperlipidemia, Gout, and Pancreatic insufficiency   Hypertension BP Readings from Last 3 Encounters:  02/03/20 (!) 154/78  08/06/19 138/76  05/06/19 (!) 146/76 .  Pharmacist Clinical Goal(s): o Over the next 90 days, patient will work with PharmD and providers to achieve BP goal <140/90 . Current regimen:  o Chlorthalidone 25 mg daily  o Losartan 100 mg daily  o Metoprolol XL 25 mg daily  . Interventions: o Discussed BP goals and benefits of medication for prevention of heart attack / stroke . Patient self care activities - Over the next 90 days, patient will: o Check BP daily, document, and provide at future appointments o Ensure daily salt intake < 2300 mg/day  Hyperlipidemia Lab Results  Component Value Date/Time   LDLCALC 50 02/03/2020 01:56 PM .  Pharmacist Clinical Goal(s): o Over the next 90 days, patient will work with PharmD and providers to maintain LDL goal < 100 . Current regimen:  o Atorvastatin 40 mg daily  o CoQ10 . Interventions: o Discussed cholesterol goals and benefits of medication for prevention of heart attack / stroke . Patient self care activities - Over the next 90 days, patient will: o Continue current medications o Continue low cholesterol diet  Pancreatic insufficiency . Pharmacist Clinical Goal(s) o Over the next 90 days, patient will work with PharmD and providers to reduce medication cost . Current  regimen:  o Creon 36,000 units 3 times daily with meals . Interventions: o Contact [employer] patient assistance to assess status of application . Patient self care activities - Over the next 90 days, patient will: o Follow up with [employer] patient assistance as needed  Gout . Pharmacist Clinical Goal(s) o Over the next 90 days, patient will work with PharmD and providers to optimize therapy . Current regimen:  o Mitigare 0.6 mg daily . Interventions: o Discussed high cost of medication and cheaper options available - allopurinol is $0/90 days with insurance o Recommend to follow up with PCP and check uric acid level to determine if gout prophylaxis therapy . Patient self care activities - Over the next 90 days, patient will: o Follow up with PCP  Medication management . Pharmacist Clinical Goal(s): o Over the next 90 days, patient will work with PharmD and providers to maintain optimal medication adherence . Current pharmacy: [facility] mail order . Interventions o Comprehensive medication review performed. o Continue current medication management strategy . Patient self care activities - Over the next 90 days, patient will: o Focus on medication adherence by fill date o Take medications as prescribed o Report any questions or concerns to PharmD and/or provider(s)  Initial goal documentation       [name] was given information about Chronic Care Management services today including:  1. CCM service includes personalized support from designated clinical staff supervised by his physician, including individualized plan of care and coordination with other care providers 2. 24/7  contact phone numbers for assistance for urgent and routine care needs. 3. Standard insurance, coinsurance, copays and deductibles apply for chronic care management only during months in which we provide at least 20 minutes of these services. Most insurances cover these services at 100%, however patients may be  responsible for any copay, coinsurance and/or deductible if applicable. This service may help you avoid the need for more expensive face-to-face services. 4. Only one practitioner may furnish and bill the service in a calendar month. 5. The patient may stop CCM services at any time (effective at the end of the month) by phone call to the office staff.  Patient agreed to services and verbal consent obtained.   The patient verbalized understanding of instructions provided today and agreed to receive a mailed copy of patient instruction and/or educational materials. Telephone follow up appointment with pharmacy team member scheduled for: 3 months  Thomas Chung, PharmD Clinical Pharmacist [facility] [phone]  Heart-Healthy Eating Plan Many factors influence your heart (coronary) health, including eating and exercise habits. Coronary risk increases with abnormal blood fat (lipid) levels. Heart-healthy meal planning includes limiting unhealthy fats, increasing healthy fats, and making other diet and lifestyle changes. What are tips for following this plan? Cooking Cook foods using methods other than frying. Baking, boiling, grilling, and broiling are all good options. Other ways to reduce fat include:  Removing the skin from poultry.  Removing all visible fats from meats.  Steaming vegetables in water or broth. Meal planning   At meals, imagine dividing your plate into fourths: ? Fill one-half of your plate with vegetables and green salads. ? Fill one-fourth of your plate with whole grains. ? Fill one-fourth of your plate with lean protein foods.  Eat 4-5 servings of vegetables per day. One serving equals 1 cup raw or cooked vegetable, or 2 cups raw leafy greens.  Eat 4-5 servings of fruit per day. One serving equals 1 medium whole fruit,  cup dried fruit,  cup fresh, frozen, or canned fruit, or  cup 100% fruit juice.  Eat more foods that contain  soluble fiber. Examples include apples, broccoli, carrots, beans, peas, and barley. Aim to get 25-30 g of fiber per day.  Increase your consumption of legumes, nuts, and seeds to 4-5 servings per week. One serving of dried beans or legumes equals  cup cooked, 1 serving of nuts is  cup, and 1 serving of seeds equals 1 tablespoon. Fats  Choose healthy fats more often. Choose monounsaturated and polyunsaturated fats, such as olive and canola oils, flaxseeds, walnuts, almonds, and seeds.  Eat more omega-3 fats. Choose salmon, mackerel, sardines, tuna, flaxseed oil, and ground flaxseeds. Aim to eat fish at least 2 times each week.  Check food labels carefully to identify foods with trans fats or high amounts of saturated fat.  Limit saturated fats. These are found in animal products, such as meats, butter, and cream. Plant sources of saturated fats include palm oil, palm kernel oil, and coconut oil.  Avoid foods with partially hydrogenated oils in them. These contain trans fats. Examples are stick margarine, some tub margarines, cookies, crackers, and other baked goods.  Avoid fried foods. General information  Eat more home-cooked food and less restaurant, buffet, and fast food.  Limit or avoid alcohol.  Limit foods that are high in starch and sugar.  Lose weight if you are overweight. Losing just 5-10% of your body weight can help your overall health and prevent diseases such as  diabetes and heart disease.  Monitor your salt (sodium) intake, especially if you have high blood pressure. Talk with your health care provider about your sodium intake.  Try to incorporate more vegetarian meals weekly. What foods can I eat? Fruits All fresh, canned (in natural juice), or frozen fruits. Vegetables Fresh or frozen vegetables (raw, steamed, roasted, or grilled). Green salads. Grains Most grains. Choose whole wheat and whole grains most of the time. Rice and pasta, including brown rice and  pastas made with whole wheat. Meats and other proteins Lean, well-trimmed beef, veal, pork, and lamb. Chicken and [place] without skin. All fish and shellfish. Wild duck, rabbit, pheasant, and venison. Egg whites or low-cholesterol egg substitutes. Dried beans, peas, lentils, and tofu. Seeds and most nuts. Dairy Low-fat or nonfat cheeses, including ricotta and mozzarella. Skim or 1% milk (liquid, powdered, or evaporated). Buttermilk made with low-fat milk. Nonfat or low-fat yogurt. Fats and oils Non-hydrogenated (trans-free) margarines. Vegetable oils, including soybean, sesame, sunflower, olive, peanut, safflower, corn, canola, and cottonseed. Salad dressings or mayonnaise made with a vegetable oil. Beverages Water (mineral or sparkling). Coffee and tea. Diet carbonated beverages. Sweets and desserts Sherbet, gelatin, and fruit ice. Small amounts of dark chocolate. Limit all sweets and desserts. Seasonings and condiments All seasonings and condiments. The items listed above may not be a complete list of foods and beverages you can eat. Contact a dietitian for more options. What foods are not recommended? Fruits Canned fruit in heavy syrup. Fruit in cream or butter sauce. Fried fruit. Limit coconut. Vegetables Vegetables cooked in cheese, cream, or butter sauce. Fried vegetables. Grains Breads made with saturated or trans fats, oils, or whole milk. Croissants. Sweet rolls. Donuts. High-fat crackers, such as cheese crackers. Meats and other proteins Fatty meats, such as hot dogs, ribs, sausage, bacon, rib-eye roast or steak. High-fat deli meats, such as salami and bologna. Caviar. Domestic duck and goose. Organ meats, such as liver. Dairy Cream, sour cream, cream cheese, and creamed cottage cheese. Whole milk cheeses. Whole or 2% milk (liquid, evaporated, or condensed). Whole buttermilk. Cream sauce or high-fat cheese sauce. Whole-milk yogurt. Fats and oils Meat fat, or shortening. Cocoa  butter, hydrogenated oils, palm oil, coconut oil, palm kernel oil. Solid fats and shortenings, including bacon fat, salt pork, lard, and butter. Nondairy cream substitutes. Salad dressings with cheese or sour cream. Beverages Regular sodas and any drinks with added sugar. Sweets and desserts Frosting. Pudding. Cookies. Cakes. Pies. Milk chocolate or white chocolate. Buttered syrups. Full-fat ice cream or ice cream drinks. The items listed above may not be a complete list of foods and beverages to avoid. Contact a dietitian for more information. Summary  Heart-healthy meal planning includes limiting unhealthy fats, increasing healthy fats, and making other diet and lifestyle changes.  Lose weight if you are overweight. Losing just 5-10% of your body weight can help your overall health and prevent diseases such as diabetes and heart disease.  Focus on eating a balance of foods, including fruits and vegetables, low-fat or nonfat dairy, lean protein, nuts and legumes, whole grains, and heart-healthy oils and fats. This information is not intended to replace advice given to you by your health care provider. Make sure you discuss any questions you have with your health care provider. Document Revised: 12/14/2017 Document Reviewed: 12/14/2017 Elsevier Patient Education  2020 [employer].

## 2020-05-25 NOTE — Chronic Care Management (AMB) (Signed)
Chronic Care Management Pharmacy  Name: Thomas Chung  MRN: [id number] DOB: 14-Sep-1941   Chief Complaint/ HPI  Thomas Chung,  79 y.o. , male presents for their Initial CCM visit with the clinical pharmacist via telephone due to COVID-19 Pandemic.  PCP : Thomas Lima, MD Patient Care Team: Thomas Lima, MD as PCP - General Thomas [name], MD as PCP - Cardiology (Cardiology) Thomas Sensing, MD as Consulting Physician (Dermatology) Thomas Mayer, MD as Consulting Physician (Gastroenterology) Thomas Cantor, MD as Consulting Physician (Ophthalmology) Thomas Chung, DDS as Consulting Physician (Dentistry) Thomas Matin, MD as Consulting Physician (Dermatology) Thomas Chung, [facility] as Pharmacist (Pharmacist)  Their chronic conditions include: Hypertension, Hyperlipidemia, Diabetes, GERD and Exocrine pancreatic insuffiency   [employer] veteran, worked for [employer] since 1981. Retired 12 years ago. 3 daughters, 5 grandchildren, all in the area. He goes to [place] 5 days a week, swims 4 days a week, walks most days. Looking for a general yoga class.  Office Visits: 02/03/20: Patient presented to Dr. Ronnald Chung for follow-up. BP in clinic 153/78, patient states home Bps in the 160s. No medication changes made.   Consult Visit: 03/18/20: Patient presented to Dr. Barkley Chung (Podiatry)  01/30/20: Patient presented to Dr. Barkley Chung (Podiatry)  12/05/19: Patient presented to Dr. Barkley Chung (Podiatry)  12/01/19: Patient presented to Dr. Barkley Chung (Podiatry)    Allergies  Allergen Reactions  . Amlodipine Swelling  . Prevnar [Pneumococcal 13-Val Conj Vacc]     Joint pain, swelling    Medications: Outpatient Encounter Medications as of 05/25/2020  Medication Sig  . ACCU-CHEK AVIVA PLUS test strip USE AS INSTRUCTED TO TEST BLOOD SUGAR DAILY  . Accu-Chek Softclix Lancets lancets Use to check blood sugar daily. DX: E11.8  . Alcohol Swabs (B-D SINGLE USE SWABS REGULAR) PADS Inject 1 Act into the skin  See admin instructions.  Marland Kitchen atorvastatin (LIPITOR) 40 MG tablet TAKE 1 TABLET EVERY DAY  . B Complex-C-Folic Acid (SUPER B COMPLEX/FA/VIT C) TABS Take by mouth.  . Blood Glucose Monitoring Suppl (TRUE METRIX AIR GLUCOSE METER) w/Device KIT 1 Act by Does not apply route 2 (two) times daily.  . chlorthalidone (HYGROTON) 25 MG tablet TAKE 1 TABLET EVERY DAY  . Coenzyme Q10 (COQ10 PO)   . lipase/protease/amylase (CREON) 36000 UNITS CPEP capsule Take 1 capsule (36,000 Units total) by mouth 3 (three) times daily with meals.  Marland Kitchen loperamide (IMODIUM) 2 MG capsule Take by mouth as needed for diarrhea or loose stools.  Marland Kitchen losartan (COZAAR) 100 MG tablet TAKE 1 TABLET EVERY DAY  . metoprolol succinate (TOPROL-XL) 25 MG 24 hr tablet TAKE 1 TABLET DAILY. TAKE WITH OR IMMEDIATELY FOLLOWING A MEAL.  . tadalafil (CIALIS) 5 MG tablet Take 1 tablet (5 mg total) by mouth daily as needed for erectile dysfunction.  . cyanocobalamin 1000 MCG tablet Take 1 tablet (1,000 mcg total) by mouth daily. (Patient not taking: Reported on 05/25/2020)  . [DISCONTINUED] atorvastatin (LIPITOR) 40 MG tablet TAKE 1 TABLET EVERY DAY  . [DISCONTINUED] chlorthalidone (HYGROTON) 25 MG tablet TAKE 1 TABLET EVERY DAY  . [DISCONTINUED] losartan (COZAAR) 100 MG tablet TAKE 1 TABLET EVERY DAY   No facility-administered encounter medications on file as of 05/25/2020.     Current Diagnosis/Assessment:  SDOH Interventions     Most Recent Value  SDOH Interventions  Financial Strain Interventions Other (Comment)  [Creon PAP in process, see if we can switch Mitigare to allopurinol]      Goals Addressed  This Visit's Progress   . Pharmacy Care Plan       CARE PLAN ENTRY (see longitudinal plan of care for additional care plan information)  Current Barriers:  . Chronic Disease Management support, education, and care coordination needs related to Hypertension, Hyperlipidemia, Gout, and Pancreatic insufficiency    Hypertension BP Readings from Last 3 Encounters:  02/03/20 (!) 154/78  08/06/19 138/76  05/06/19 (!) 146/76 .  Pharmacist Clinical Goal(s): o Over the next 90 days, patient will work with PharmD and providers to achieve BP goal <140/90 . Current regimen:  o Chlorthalidone 25 mg daily  o Losartan 100 mg daily  o Metoprolol XL 25 mg daily  . Interventions: o Discussed BP goals and benefits of medication for prevention of heart attack / stroke . Patient self care activities - Over the next 90 days, patient will: o Check BP daily, document, and provide at future appointments o Ensure daily salt intake < 2300 mg/day  Hyperlipidemia Lab Results  Component Value Date/Time   LDLCALC 50 02/03/2020 01:56 PM .  Pharmacist Clinical Goal(s): o Over the next 90 days, patient will work with PharmD and providers to maintain LDL goal < 100 . Current regimen:  o Atorvastatin 40 mg daily  o CoQ10 . Interventions: o Discussed cholesterol goals and benefits of medication for prevention of heart attack / stroke . Patient self care activities - Over the next 90 days, patient will: o Continue current medications o Continue low cholesterol diet  Pancreatic insufficiency . Pharmacist Clinical Goal(s) o Over the next 90 days, patient will work with PharmD and providers to reduce medication cost . Current regimen:  o Creon 36,000 units 3 times daily with meals . Interventions: o Contact [employer] patient assistance to assess status of application . Patient self care activities - Over the next 90 days, patient will: o Follow up with [employer] patient assistance as needed  Gout . Pharmacist Clinical Goal(s) o Over the next 90 days, patient will work with PharmD and providers to optimize therapy . Current regimen:  o Mitigare 0.6 mg daily . Interventions: o Discussed high cost of medication and cheaper options available - allopurinol is $0/90 days with insurance o Recommend to follow up with PCP and  check uric acid level to determine if gout prophylaxis therapy . Patient self care activities - Over the next 90 days, patient will: o Follow up with PCP  Medication management . Pharmacist Clinical Goal(s): o Over the next 90 days, patient will work with PharmD and providers to maintain optimal medication adherence . Current pharmacy: [facility] order . Interventions o Comprehensive medication review performed. o Continue current medication management strategy . Patient self care activities - Over the next 90 days, patient will: o Focus on medication adherence by fill date o Take medications as prescribed o Report any questions or concerns to PharmD and/or provider(s)  Initial goal documentation      Diabetes   A1c goal <7%  Recent Relevant Labs: Lab Results  Component Value Date/Time   HGBA1C 6.5 02/03/2020 01:56 PM   HGBA1C 6.3 (A) 08/06/2019 02:03 PM   HGBA1C 6.5 05/06/2019 01:41 PM   GFR 59.68 (L) 02/03/2020 01:56 PM   GFR 52.97 (L) 08/06/2019 02:17 PM   MICROALBUR 1.3 02/03/2020 01:56 PM   MICROALBUR 1.7 05/06/2019 01:41 PM    Last diabetic Eye exam:  Lab Results  Component Value Date/Time   HMDIABEYEEXA No Retinopathy 11/28/2018 12:00 AM    Last diabetic Foot exam:  Lab Results  Component Value Date/Time   HMDIABFOOTEX done 08/07/2013 12:00 AM    No medication indicated  We discussed: diet and exercise extensively; trying to cut carbs, eat more veggies and protein.  Plan  Continue control with diet and exercise  Hypertension   BP goal is:  <140/90  Office blood pressures are  BP Readings from Last 3 Encounters:  02/03/20 (!) 154/78  08/06/19 138/76  05/06/19 (!) 146/76   Kidney Function Lab Results  Component Value Date/Time   CREATININE 1.18 02/03/2020 01:56 PM   CREATININE 1.31 08/06/2019 02:17 PM   GFR 59.68 (L) 02/03/2020 01:56 PM   GFRNONAA 63 09/05/2017 10:37 AM   GFRAA 73 09/05/2017 10:37 AM   K 4.8 02/03/2020 01:56 PM   K 3.8  08/06/2019 02:17 PM   Patient checks BP at home daily Patient home BP readings are ranging: 143/83, 120/67  Patient has failed these meds in the past: Amlodipine, azilsartan, HCTZ, itemization, valsartan  Patient is currently controlled on the following medications:  . Chlorthalidone 25 mg daily -AM . Losartan 100 mg daily - PM . Metoprolol XL 25 mg daily - PM  We discussed diet and exercise extensively; discussed salt intake - he does not add extra salt to food, trying to eat more at home. He does read nutrition labels and avoids high sodium foods.   Plan  Continue current medications and control with diet and exercise   Hyperlipidemia   LDL goal < 100  Lipid Panel     Component Value Date/Time   CHOL 117 02/03/2020 1356   TRIG 168.0 (H) 02/03/2020 1356   HDL 34.20 (L) 02/03/2020 1356   LDLCALC 50 02/03/2020 1356    Hepatic Function Latest Ref Rng & Units 02/03/2020 01/21/2019 01/22/2018  Total Protein 6.0 - 8.3 g/dL 6.9 7.0 6.8  Albumin 3.5 - 5.2 g/dL 4.5 4.6 4.4  AST 0 - 37 U/L '23 18 17  '$ ALT 0 - 53 U/L '26 18 18  '$ Alk Phosphatase 39 - 117 U/L 60 54 61  Total Bilirubin 0.2 - 1.2 mg/dL 0.7 0.7 0.9  Bilirubin, Direct 0.0 - 0.3 mg/dL 0.1 - -     The ASCVD Risk score ([place]., et al., 2013) failed to calculate for the following reasons:   The valid total cholesterol range is 130 to 320 mg/dL   Patient has failed these meds in past: n/a Patient is currently controlled on the following medications:  . Atorvastatin 40 mg daily   CoQ10 liquid  We discussed:  Cholesterol goals, benefits of statin for ASCVD prevention; pt denies side effects/issues with statin, he started taking CoQ10 to "replenish enzymes"  Plan  Continue current medications and control with diet and exercise  Pancreatic insufficiency    Patient has failed these meds in past: n/a Patient is currently controlled on the following medications:   Creon 36,000 units 1 capsule TID  We discussed:  Pt  reports he is currently paying $500 for 200 capsules in donut hole. [employer] PAP is in process, updated form faxed 05/18/20. Attempted to contact [employer] for status of application however hold time was too long at the time.    Plan  Continue current medications  Follow up with [employer] PAP  Gout?   Patient has failed these meds in past: n/a Patient is currently controlled on the following medications:  . Colchicine 0.6 mg daily (Mitigare brand covered)  We discussed: Pt reports his podiatrist started colchicine and naproxen for a swollen  toe about 6 months ago, and toe improved with combination. He is now taking colchicine (Mitigare) once daily for prevention.  Brand name preferred by insurance, $300 per 90 days in donut hole. Pt has enough Mitigare for 3 months. Pt does not appear to have gout diagnosis in chart, and no uric acid levels either.  May consider switching to allopurinol as this is $0 copay with insurance.   Plan  Recommend uric acid level at next PCP appt Consider switch Mitigare to allopurinol for cost savings (if uric acid elevated)  Misc / OTC    Patient has failed these meds in past: n/a Patient is currently controlled on the following medications:  . Loperamide 2 mg PRN  . Tadalafil 5 mg daily PRN . Multivitamin  . Super Complex - zinc, A, C, D3, E . [place]  We discussed:  No benefit from duplicate vitamins; pt may save money by consolidating vitamins  Plan  Continue current medications   Medication Management   Pt uses [facility] for all medications Uses pill box? No - prefers bottles Pt endorses 100% compliance  We discussed: Generics are $0 with [employer] mail order; pt is satisfied with current pharmacy  Plan  Continue current medication management strategy    Follow up: 3 month phone visit  Charlene Brooke, PharmD Clinical Pharmacist [facility] [phone]

## 2020-06-02 ENCOUNTER — Telehealth: Payer: Self-pay | Admitting: Pharmacist

## 2020-06-02 NOTE — Telephone Encounter (Signed)
I contacted [employer] Assist to check on status of Creon application. Patient does not qualify right now due to household income being just over the $87,100 annual limit. The [employer] representative informed me the patient can submit out of pocket prescriptionand medical costs for himself and his wife to potentially qualify.   Called patient to inform him, he will bring proof of OOP costs to the office for [place] to fax to [place] [phone]).

## 2020-06-18 DIAGNOSIS — I739 Peripheral vascular disease, unspecified: Secondary | ICD-10-CM | POA: Diagnosis not present

## 2020-06-18 DIAGNOSIS — E1151 Type 2 diabetes mellitus with diabetic peripheral angiopathy without gangrene: Secondary | ICD-10-CM | POA: Diagnosis not present

## 2020-06-18 DIAGNOSIS — L84 Corns and callosities: Secondary | ICD-10-CM | POA: Diagnosis not present

## 2020-06-18 DIAGNOSIS — L603 Nail dystrophy: Secondary | ICD-10-CM | POA: Diagnosis not present

## 2020-07-03 ENCOUNTER — Other Ambulatory Visit: Payer: Self-pay | Admitting: Internal Medicine

## 2020-07-03 DIAGNOSIS — E118 Type 2 diabetes mellitus with unspecified complications: Secondary | ICD-10-CM

## 2020-07-03 DIAGNOSIS — E785 Hyperlipidemia, unspecified: Secondary | ICD-10-CM

## 2020-07-03 DIAGNOSIS — I10 Essential (primary) hypertension: Secondary | ICD-10-CM

## 2020-07-07 ENCOUNTER — Telehealth: Payer: Self-pay | Admitting: Internal Medicine

## 2020-07-07 DIAGNOSIS — E118 Type 2 diabetes mellitus with unspecified complications: Secondary | ICD-10-CM

## 2020-07-07 MED ORDER — ACCU-CHEK SOFTCLIX LANCETS MISC: 3 refills | Status: AC

## 2020-07-07 MED ORDER — BLOOD GLUCOSE MONITOR KIT: PACK | 0 refills | Status: AC

## 2020-07-07 MED ORDER — GLUCOSE BLOOD VI STRP: ORAL_STRIP | 3 refills | Status: AC

## 2020-07-07 NOTE — Telephone Encounter (Signed)
New message:   Thomas Chung is calling from [facility] requesting a Accu Check Guide Meter, test strips, and lancets for the patient. Please advise.

## 2020-07-08 DIAGNOSIS — Z03818 Encounter for observation for suspected exposure to other biological agents ruled out: Secondary | ICD-10-CM | POA: Diagnosis not present

## 2020-07-09 DIAGNOSIS — Z03818 Encounter for observation for suspected exposure to other biological agents ruled out: Secondary | ICD-10-CM | POA: Diagnosis not present

## 2020-08-05 ENCOUNTER — Encounter: Payer: Self-pay | Admitting: Internal Medicine

## 2020-08-05 ENCOUNTER — Other Ambulatory Visit: Payer: Self-pay

## 2020-08-05 ENCOUNTER — Ambulatory Visit (INDEPENDENT_AMBULATORY_CARE_PROVIDER_SITE_OTHER): Payer: Medicare HMO | Admitting: Internal Medicine

## 2020-08-05 VITALS — BP 160/90 | HR 69 | Temp 98.4°F | Resp 16 | Ht 76.0 in | Wt 268.0 lb

## 2020-08-05 DIAGNOSIS — Z23 Encounter for immunization: Secondary | ICD-10-CM | POA: Diagnosis not present

## 2020-08-05 DIAGNOSIS — K8681 Exocrine pancreatic insufficiency: Secondary | ICD-10-CM

## 2020-08-05 DIAGNOSIS — D75839 Thrombocytosis, unspecified: Secondary | ICD-10-CM

## 2020-08-05 DIAGNOSIS — I10 Essential (primary) hypertension: Secondary | ICD-10-CM | POA: Diagnosis not present

## 2020-08-05 DIAGNOSIS — M109 Gout, unspecified: Secondary | ICD-10-CM | POA: Insufficient documentation

## 2020-08-05 DIAGNOSIS — D473 Essential (hemorrhagic) thrombocythemia: Secondary | ICD-10-CM

## 2020-08-05 DIAGNOSIS — M1A09X0 Idiopathic chronic gout, multiple sites, without tophus (tophi): Secondary | ICD-10-CM | POA: Diagnosis not present

## 2020-08-05 DIAGNOSIS — Z1159 Encounter for screening for other viral diseases: Secondary | ICD-10-CM | POA: Insufficient documentation

## 2020-08-05 DIAGNOSIS — E118 Type 2 diabetes mellitus with unspecified complications: Secondary | ICD-10-CM | POA: Diagnosis not present

## 2020-08-05 DIAGNOSIS — E538 Deficiency of other specified B group vitamins: Secondary | ICD-10-CM

## 2020-08-05 MED ORDER — EDARBI 80 MG PO TABS: 1.0000 | ORAL_TABLET | Freq: Every day | ORAL | 0 refills | Status: DC

## 2020-08-05 NOTE — Progress Notes (Signed)
Subjective:  Patient ID: Thomas Chung, male    DOB: 1941/11/12  Age: 79 y.o. MRN: [id number]  CC: Anemia, Diabetes, and Hypertension  This visit occurred during the SARS-CoV-2 public health emergency.  Safety protocols were in place, including screening questions prior to the visit, additional usage of staff PPE, and extensive cleaning of exam room while observing appropriate contact time as indicated for disinfecting solutions.    HPI Thomas Chung presents for f/up   1. He was recently treated for a flareup of gout in his foot. He tells me he is taking colchicine and naproxen. He wants to know what he can do to prevent further gout episodes.  2. He walks about 30 minutes a day and denies chest pain, shortness of breath, palpitations, edema, or fatigue. He does not monitor his blood pressure but he tells me he is compliant with losartan and chlorthalidone.   3. He tells me his blood sugars have been well controlled and he denies polys.  4. He tells me he is taking the pancreatic enzyme supplement and his bowel movements have been normal. He denies abdominal pain or weight loss.   Outpatient Medications Prior to Visit  Medication Sig Dispense Refill  . Accu-Chek Softclix Lancets lancets Use to check blood sugar daily. DX: E11.8 100 each 3  . Alcohol Swabs (B-D SINGLE USE SWABS REGULAR) PADS Inject 1 Act into the skin See admin instructions. 300 each 2  . atorvastatin (LIPITOR) 40 MG tablet TAKE 1 TABLET EVERY DAY 90 tablet 0  . B Complex-C-Folic Acid (SUPER B COMPLEX/FA/VIT C) TABS Take by mouth.    . blood glucose meter kit and supplies KIT Dispense based on patient and insurance preference. Use to check to blood sugar daily. DX: E11.9 1 each 0  . chlorthalidone (HYGROTON) 25 MG tablet TAKE 1 TABLET EVERY DAY 90 tablet 0  . Coenzyme Q10 (COQ10 PO)     . cyanocobalamin 1000 MCG tablet Take 1 tablet (1,000 mcg total) by mouth daily. 90 tablet 1  . glucose blood test strip Use to  check to blood sugar daily. DX: E11.9 100 each 3  . lipase/protease/amylase (CREON) 36000 UNITS CPEP capsule Take 1 capsule (36,000 Units total) by mouth 3 (three) times daily with meals. 270 capsule 3  . loperamide (IMODIUM) 2 MG capsule Take by mouth as needed for diarrhea or loose stools.    . metoprolol succinate (TOPROL-XL) 25 MG 24 hr tablet TAKE 1 TABLET DAILY. TAKE WITH OR IMMEDIATELY FOLLOWING A MEAL. 90 tablet 3  . MITIGARE 0.6 MG CAPS     . naproxen (NAPROSYN) 500 MG tablet     . tadalafil (CIALIS) 5 MG tablet Take 1 tablet (5 mg total) by mouth daily as needed for erectile dysfunction. 90 tablet 1  . losartan (COZAAR) 100 MG tablet TAKE 1 TABLET EVERY DAY 90 tablet 0   No facility-administered medications prior to visit.    ROS Review of Systems  Constitutional: Negative for appetite change, diaphoresis, fatigue and unexpected weight change.  HENT: Negative.   Eyes: Negative for visual disturbance.  Cardiovascular: Negative for chest pain, palpitations and leg swelling.  Gastrointestinal: Negative for abdominal pain, constipation, diarrhea, nausea and vomiting.  Endocrine: Negative.  Negative for polydipsia, polyphagia and polyuria.  Genitourinary: Negative.  Negative for difficulty urinating.  Musculoskeletal: Positive for arthralgias. Negative for myalgias and neck pain.  Skin: Negative.   Neurological: Negative.  Negative for dizziness, weakness and light-headedness.  Hematological: Negative for adenopathy.  Does not bruise/bleed easily.  Psychiatric/Behavioral: Negative.     Objective:  BP (!) 160/90 (BP Location: Left Arm, Patient Position: Sitting, Cuff Size: Large)   Pulse 69   Temp 98.4 F (36.9 C) (Oral)   Resp 16   Ht $R'6\' 4"'MC$  (1.93 m)   Wt 268 lb (121.6 kg)   SpO2 98%   BMI 32.62 kg/m   BP Readings from Last 3 Encounters:  08/05/20 (!) 160/90  02/03/20 (!) 154/78  08/06/19 138/76    Wt Readings from Last 3 Encounters:  08/05/20 268 lb (121.6 kg)   02/03/20 268 lb 8 oz (121.8 kg)  08/06/19 266 lb (120.7 kg)    Physical Exam Vitals reviewed.  Constitutional:      Appearance: Normal appearance.  HENT:     Nose: Nose normal.     Mouth/Throat:     Mouth: Mucous membranes are moist.  Eyes:     General: No scleral icterus.    Conjunctiva/sclera: Conjunctivae normal.  Cardiovascular:     Rate and Rhythm: Normal rate and regular rhythm.     Pulses: Normal pulses.     Heart sounds: No murmur heard.   Pulmonary:     Effort: Pulmonary effort is normal.     Breath sounds: No wheezing, rhonchi or rales.  Abdominal:     General: Abdomen is flat.     Palpations: There is no mass.     Tenderness: There is no abdominal tenderness. There is no guarding.  Musculoskeletal:        General: No tenderness. Normal range of motion.     Cervical back: Neck supple.  Lymphadenopathy:     Cervical: No cervical adenopathy.  Skin:    General: Skin is warm and dry.  Neurological:     General: No focal deficit present.     Mental Status: He is alert.  Psychiatric:        Mood and Affect: Mood normal.        Behavior: Behavior normal.     Lab Results  Component Value Date   WBC 12.0 (H) 08/05/2020   HGB 15.2 08/05/2020   HCT 48.1 08/05/2020   PLT 541 (H) 08/05/2020   GLUCOSE 87 08/05/2020   CHOL 117 02/03/2020   TRIG 168.0 (H) 02/03/2020   HDL 34.20 (L) 02/03/2020   LDLCALC 50 02/03/2020   ALT 26 02/03/2020   AST 23 02/03/2020   NA 140 08/05/2020   K 4.4 08/05/2020   CL 102 08/05/2020   CREATININE 1.20 (H) 08/05/2020   BUN 28 (H) 08/05/2020   CO2 28 08/05/2020   TSH 1.46 02/03/2020   PSA 0.09 (L) 02/03/2020   HGBA1C 6.5 (H) 08/05/2020   MICROALBUR 1.3 02/03/2020    VAS [place] LOWER EXTREMITY VENOUS (DVT)  Result Date: 01/28/2018  Lower Venous Study Indication: Pain, Swelling, and Gout. Examination Guidelines: A complete evaluation includes B-mode imaging, spectral doppler, color doppler, and power doppler as needed of all  accessible portions of each vessel. Bilateral testing is considered an integral part of a complete examination. Limited examinations for reoccurring indications may be performed as noted. No prior study on file for comparison.  Right Venous Findings: +---+---------------+---------+-----------+----------+-------+    CompressibilityPhasicitySpontaneityPropertiesSummary +---+---------------+---------+-----------+----------+-------+ CFVFull           Yes      Yes                          +---+---------------+---------+-----------+----------+-------+  Left Venous  Findings: +---------+---------------+---------+-----------+----------+-------+          CompressibilityPhasicitySpontaneityPropertiesSummary +---------+---------------+---------+-----------+----------+-------+ CFV      Full           Yes      Yes                          +---------+---------------+---------+-----------+----------+-------+ FV Prox  Full                                                 +---------+---------------+---------+-----------+----------+-------+ FV Mid   Full                                                 +---------+---------------+---------+-----------+----------+-------+ FV DistalFull                                                 +---------+---------------+---------+-----------+----------+-------+ PFV      Full           Yes      Yes                          +---------+---------------+---------+-----------+----------+-------+ POP      Full           Yes      Yes                          +---------+---------------+---------+-----------+----------+-------+ PTV      Full                                                 +---------+---------------+---------+-----------+----------+-------+ PERO     Full                                                 +---------+---------------+---------+-----------+----------+-------+ GSV      Full                                                  +---------+---------------+---------+-----------+----------+-------+    Final Interpretation: Right: No evidence of common femoral vein obstruction. Left: Ultrasound characteristics of enlarged lymph nodes noted in the groin. There is no evidence of deep vein thrombosis in the lower extremity.There is no evidence of superficial venous thrombosis.  *See table(s) above for measurements and observations. Electronically signed by Ruta Hinds on 01/28/2018 at 8:49:09 PM.     Assessment & Plan:   [place] was seen today for anemia, diabetes and hypertension.  Diagnoses and all orders for this visit:  B12 deficiency- His H&H are normal now. He will continue the B12 replacement therapy. -     CBC with Differential/Platelet; Future -  CBC with Differential/Platelet  Thrombocytosis ([place])- His platelet count remains mildly elevated. His white cell count is also elevated. His H&H and other cell lines are normal. This appears benign. Will continue to follow this in the future.  Type II diabetes mellitus with manifestations ([place])- His blood sugar is adequately well controlled. -     Hemoglobin A1c; Future -     BASIC METABOLIC PANEL WITH GFR; Future -     Azilsartan Medoxomil (EDARBI) 80 MG TABS; Take 1 tablet (80 mg total) by mouth daily. -     BASIC METABOLIC PANEL WITH GFR -     Hemoglobin A1c  Idiopathic chronic gout of multiple sites without tophus- He has not achieved his uric acid goal of less than 6. I recommended that he start taking allopurinol. -     Uric acid; Future -     BASIC METABOLIC PANEL WITH GFR; Future -     BASIC METABOLIC PANEL WITH GFR -     Uric acid -     allopurinol (ZYLOPRIM) 100 MG tablet; Take 1 tablet (100 mg total) by mouth daily.  Need for hepatitis C screening test -     Hepatitis C antibody; Future -     Hepatitis C antibody  Essential hypertension, benign- His blood pressure is not adequately well controlled. I recommended that he upgrade  to a more potent ARB and to continue the current dose of chlorthalidone. -     Azilsartan Medoxomil (EDARBI) 80 MG TABS; Take 1 tablet (80 mg total) by mouth daily.  Need for Tdap vaccination -     Tdap vaccine greater than or equal to 7yo IM  Need for influenza vaccination -     Flu Vaccine QUAD High Dose(Fluad)   I have discontinued Thomas Chung "[name]"'s losartan. I am also having him start on Edarbi and allopurinol. Additionally, I am having him maintain his B-D SINGLE USE SWABS REGULAR, loperamide, tadalafil, Coenzyme Q10 (COQ10 PO), metoprolol succinate, cyanocobalamin, lipase/protease/amylase, Super B Complex/FA/Vit C, atorvastatin, chlorthalidone, Accu-Chek Softclix Lancets, blood glucose meter kit and supplies, glucose blood, naproxen, and Mitigare.  Meds ordered this encounter  Medications  . Azilsartan Medoxomil (EDARBI) 80 MG TABS    Sig: Take 1 tablet (80 mg total) by mouth daily.    Dispense:  63 tablet    Refill:  0  . allopurinol (ZYLOPRIM) 100 MG tablet    Sig: Take 1 tablet (100 mg total) by mouth daily.    Dispense:  90 tablet    Refill:  1   I spent 50 minutes in preparing to see the patient by review of recent labs, imaging and procedures, obtaining and reviewing separately obtained history, communicating with the patient and family or caregiver, ordering medications, tests or procedures, and documenting clinical information in the EHR including the differential Dx, treatment, and any further evaluation and other management of  1. B12 deficiency 2. Thrombocytosis ([place]) 3. Type II diabetes mellitus with manifestations ([place]) 4. Idiopathic chronic gout of multiple sites without tophus 5. Essential hypertension, benign 6. Exocrine pancreatic insufficiency     Follow-up: Return in about 3 months (around 11/04/2020).  Scarlette Calico, MD

## 2020-08-05 NOTE — Patient Instructions (Signed)

## 2020-08-06 LAB — HEMOGLOBIN A1C
Hgb A1c MFr Bld: 6.5 % of total Hgb — ABNORMAL HIGH (ref <5.7)
Mean Plasma Glucose: 140 (calc)
eAG (mmol/L): 7.7 (calc)

## 2020-08-06 LAB — CBC WITH DIFFERENTIAL/PLATELET
Absolute Monocytes: 1260 cells/uL — ABNORMAL HIGH (ref 200–950)
Basophils Absolute: 96 cells/uL (ref 0–200)
Basophils Relative: 0.8 %
Eosinophils Absolute: 648 cells/uL — ABNORMAL HIGH (ref 15–500)
Eosinophils Relative: 5.4 %
HCT: 48.1 % (ref 38.5–50.0)
Hemoglobin: 15.2 g/dL (ref 13.2–17.1)
Lymphs Abs: 1560 cells/uL (ref 850–3900)
MCH: 26.6 pg — ABNORMAL LOW (ref 27.0–33.0)
MCHC: 31.6 g/dL — ABNORMAL LOW (ref 32.0–36.0)
MCV: 84.1 fL (ref 80.0–100.0)
MPV: 10.3 fL (ref 7.5–12.5)
Monocytes Relative: 10.5 %
Neutro Abs: 8436 cells/uL — ABNORMAL HIGH (ref 1500–7800)
Neutrophils Relative %: 70.3 %
Platelets: 541 10*3/uL — ABNORMAL HIGH (ref 140–400)
RBC: 5.72 10*6/uL (ref 4.20–5.80)
RDW: 14.6 % (ref 11.0–15.0)
Total Lymphocyte: 13 %
WBC: 12 10*3/uL — ABNORMAL HIGH (ref 3.8–10.8)

## 2020-08-06 LAB — BASIC METABOLIC PANEL WITH GFR
BUN/Creatinine Ratio: 23 (calc) — ABNORMAL HIGH (ref 6–22)
BUN: 28 mg/dL — ABNORMAL HIGH (ref 7–25)
CO2: 28 mmol/L (ref 20–32)
Calcium: 10.3 mg/dL (ref 8.6–10.3)
Chloride: 102 mmol/L (ref 98–110)
Creat: 1.2 mg/dL — ABNORMAL HIGH (ref 0.70–1.18)
GFR, Est African American: 67 mL/min/{1.73_m2} (ref >=60)
GFR, Est Non African American: 58 mL/min/{1.73_m2} — ABNORMAL LOW (ref >=60)
Glucose, Bld: 87 mg/dL (ref 65–99)
Potassium: 4.4 mmol/L (ref 3.5–5.3)
Sodium: 140 mmol/L (ref 135–146)

## 2020-08-06 LAB — URIC ACID: Uric Acid, Serum: 7.5 mg/dL (ref 4.0–8.0)

## 2020-08-06 LAB — HEPATITIS C ANTIBODY
Hepatitis C Ab: NONREACTIVE
SIGNAL TO CUT-OFF: 0.01 (ref <1.00)

## 2020-08-07 ENCOUNTER — Encounter: Payer: Self-pay | Admitting: Internal Medicine

## 2020-08-07 MED ORDER — ALLOPURINOL 100 MG PO TABS: 100.0000 mg | ORAL_TABLET | Freq: Every day | ORAL | 1 refills | Status: DC

## 2020-08-07 NOTE — Assessment & Plan Note (Signed)
He is doing well on the current dose of Creon. Will continue.

## 2020-08-27 ENCOUNTER — Ambulatory Visit: Payer: Medicare HMO | Admitting: Pharmacist

## 2020-08-27 ENCOUNTER — Other Ambulatory Visit: Payer: Self-pay

## 2020-08-27 DIAGNOSIS — E785 Hyperlipidemia, unspecified: Secondary | ICD-10-CM

## 2020-08-27 DIAGNOSIS — I10 Essential (primary) hypertension: Secondary | ICD-10-CM

## 2020-08-27 DIAGNOSIS — M1A09X0 Idiopathic chronic gout, multiple sites, without tophus (tophi): Secondary | ICD-10-CM

## 2020-08-27 NOTE — Patient Instructions (Addendum)
Visit Information  Phone number for Pharmacist: [phone]  Goals Addressed            This Visit's Progress   . Pharmacy Care Plan       CARE PLAN ENTRY (see longitudinal plan of care for additional care plan information)  Current Barriers:  . Chronic Disease Management support, education, and care coordination needs related to Hypertension, Hyperlipidemia, and Gout   Hypertension BP Readings from Last 3 Encounters:  02/03/20 (!) 154/78  08/06/19 138/76  05/06/19 (!) 146/76 .  Pharmacist Clinical Goal(s): o Over the next 90 days, patient will work with PharmD and providers to achieve BP goal <140/90 . Current regimen:  o Chlorthalidone 25 mg daily  o Edarbi 80 mg daily o Metoprolol XL 25 mg daily  . Interventions: o Discussed BP goals and benefits of medication for prevention of heart attack / stroke o Discussed alternative ARBs if [name] is too costly (telmisartan, irbesartan, and olmesartan preferred by insurance) . Patient self care activities - Over the next 90 days, patient will: o Check BP daily, document, and provide at future appointments o Ensure daily salt intake < 2300 mg/day o Contact PCP if [name] is too costly  Hyperlipidemia Lab Results  Component Value Date/Time   LDLCALC 50 02/03/2020 01:56 PM .  Pharmacist Clinical Goal(s): o Over the next 90 days, patient will work with PharmD and providers to maintain LDL goal < 100 . Current regimen:  o Atorvastatin 40 mg daily  o CoQ10 . Interventions: o Discussed cholesterol goals and benefits of medication for prevention of heart attack / stroke . Patient self care activities - Over the next 90 days, patient will: o Continue current medications o Continue low cholesterol diet  Gout . Pharmacist Clinical Goal(s) o Over the next 90 days, patient will work with PharmD and providers to optimize therapy . Current regimen:  o Allopurinol 100 mg daily . Interventions: o Recommended to discontinue daily use  of Mitigare and naproxen now that gout flare has resolved; Advised to keep these on hand in case of future flares o Discussed benefits of allopurinol for decreasing uric acid and preventing gout flares . Patient self care activities - Over the next 90 days, patient will: o Continue medications as directed  Medication management . Pharmacist Clinical Goal(s): o Over the next 90 days, patient will work with PharmD and providers to maintain optimal medication adherence . Current pharmacy: [facility] order . Interventions o Comprehensive medication review performed. o Continue current medication management strategy . Patient self care activities - Over the next 90 days, patient will: o Focus on medication adherence by fill date o Take medications as prescribed o Report any questions or concerns to PharmD and/or provider(s)  Please see past updates related to this goal by clicking on the "Past Updates" button in the selected goal       Patient verbalizes understanding of instructions provided today.  Telephone follow up appointment with pharmacy team member scheduled for: 3 months  Charlene Brooke, PharmD, [facility] Clinical Pharmacist [facility] at [facility] [phone]

## 2020-08-27 NOTE — Chronic Care Management (AMB) (Signed)
Chronic Care Management Pharmacy  Name: Thomas Chung  MRN: [id number] DOB: Mar 22, 1941   Chief Complaint/ HPI  Thomas Chung,  79 y.o. , male presents for their Initial CCM visit with the clinical pharmacist via telephone due to COVID-19 Pandemic.  PCP : Janith Lima, MD Patient Care Team: Janith Lima, MD as PCP - General Sueanne [name], MD as PCP - Cardiology (Cardiology) Danella Sensing, MD as Consulting Physician (Dermatology) Gatha Mayer, MD as Consulting Physician (Gastroenterology) Calvert Cantor, MD as Consulting Physician (Ophthalmology) Chelsea Aus, DDS as Consulting Physician (Dentistry) Jarome Matin, MD as Consulting Physician (Dermatology) Charlton Haws, [facility] as Pharmacist (Pharmacist)  Their chronic conditions include: Hypertension, Hyperlipidemia, Diabetes, GERD and Exocrine pancreatic insuffiency   [employer] veteran, worked for [employer] since 1981. Retired 12 years ago. 3 daughters, 5 grandchildren, all in the area. He goes to [place] 5 days a week, swims 4 days a week, walks most days. Looking for a general yoga class.  Office Visits: 08/05/20 Dr Ronnald Ramp OV: switched losartan to [place]. Started allopurinol for gout prevention.  02/03/20: Patient presented to Dr. Ronnald Ramp for follow-up. BP in clinic 153/78, patient states home Bps in the 160s. No medication changes made.   Consult Visit: 03/18/20: Patient presented to Dr. Barkley Bruns (Podiatry)  01/30/20: Patient presented to Dr. Barkley Bruns (Podiatry)  12/05/19: Patient presented to Dr. Barkley Bruns (Podiatry)  12/01/19: Patient presented to Dr. Barkley Bruns (Podiatry)    Allergies  Allergen Reactions  . Amlodipine Swelling  . Prevnar [Pneumococcal 13-Val Conj Vacc]     Joint pain, swelling    Medications: Outpatient Encounter Medications as of 08/27/2020  Medication Sig  . Accu-Chek Softclix Lancets lancets Use to check blood sugar daily. DX: E11.8  . Alcohol Swabs (B-D SINGLE USE SWABS REGULAR) PADS Inject 1  Act into the skin See admin instructions.  Marland Kitchen allopurinol (ZYLOPRIM) 100 MG tablet Take 1 tablet (100 mg total) by mouth daily.  Marland Kitchen atorvastatin (LIPITOR) 40 MG tablet TAKE 1 TABLET EVERY DAY  . Azilsartan Medoxomil (EDARBI) 80 MG TABS Take 1 tablet (80 mg total) by mouth daily.  . B Complex-C-Folic Acid (SUPER B COMPLEX/FA/VIT C) TABS Take by mouth.  . blood glucose meter kit and supplies KIT Dispense based on patient and insurance preference. Use to check to blood sugar daily. DX: E11.9  . chlorthalidone (HYGROTON) 25 MG tablet TAKE 1 TABLET EVERY DAY  . Coenzyme Q10 (COQ10 PO)   . cyanocobalamin 1000 MCG tablet Take 1 tablet (1,000 mcg total) by mouth daily.  Marland Kitchen glucose blood test strip Use to check to blood sugar daily. DX: E11.9  . lipase/protease/amylase (CREON) 36000 UNITS CPEP capsule Take 1 capsule (36,000 Units total) by mouth 3 (three) times daily with meals.  Marland Kitchen loperamide (IMODIUM) 2 MG capsule Take by mouth as needed for diarrhea or loose stools.  . metoprolol succinate (TOPROL-XL) 25 MG 24 hr tablet TAKE 1 TABLET DAILY. TAKE WITH OR IMMEDIATELY FOLLOWING A MEAL.  Marland Kitchen MITIGARE 0.6 MG CAPS Take 1 capsule by mouth as needed.   . naproxen (NAPROSYN) 500 MG tablet Take 500 mg by mouth as needed (gout flare).   . tadalafil (CIALIS) 5 MG tablet Take 1 tablet (5 mg total) by mouth daily as needed for erectile dysfunction.  . [DISCONTINUED] atorvastatin (LIPITOR) 40 MG tablet TAKE 1 TABLET EVERY DAY  . [DISCONTINUED] chlorthalidone (HYGROTON) 25 MG tablet TAKE 1 TABLET EVERY DAY  . [DISCONTINUED] losartan (COZAAR) 100 MG tablet TAKE 1  TABLET EVERY DAY   No facility-administered encounter medications on file as of 08/27/2020.     Current Diagnosis/Assessment:    Goals Addressed            This Visit's Progress   . Pharmacy Care Plan       CARE PLAN ENTRY (see longitudinal plan of care for additional care plan information)  Current Barriers:  . Chronic Disease Management support,  education, and care coordination needs related to Hypertension, Hyperlipidemia, and Gout   Hypertension BP Readings from Last 3 Encounters:  02/03/20 (!) 154/78  08/06/19 138/76  05/06/19 (!) 146/76 .  Pharmacist Clinical Goal(s): o Over the next 90 days, patient will work with PharmD and providers to achieve BP goal <140/90 . Current regimen:  o Chlorthalidone 25 mg daily  o Edarbi 80 mg daily o Metoprolol XL 25 mg daily  . Interventions: o Discussed BP goals and benefits of medication for prevention of heart attack / stroke o Discussed alternative ARBs if [name] is too costly (telmisartan, irbesartan, and olmesartan preferred by insurance) . Patient self care activities - Over the next 90 days, patient will: o Check BP daily, document, and provide at future appointments o Ensure daily salt intake < 2300 mg/day o Contact PCP if [name] is too costly  Hyperlipidemia Lab Results  Component Value Date/Time   LDLCALC 50 02/03/2020 01:56 PM .  Pharmacist Clinical Goal(s): o Over the next 90 days, patient will work with PharmD and providers to maintain LDL goal < 100 . Current regimen:  o Atorvastatin 40 mg daily  o CoQ10 . Interventions: o Discussed cholesterol goals and benefits of medication for prevention of heart attack / stroke . Patient self care activities - Over the next 90 days, patient will: o Continue current medications o Continue low cholesterol diet  Gout . Pharmacist Clinical Goal(s) o Over the next 90 days, patient will work with PharmD and providers to optimize therapy . Current regimen:  o Allopurinol 100 mg daily . Interventions: o Recommended to discontinue daily use of Mitigare and naproxen now that gout flare has resolved; Advised to keep these on hand in case of future flares o Discussed benefits of allopurinol for decreasing uric acid and preventing gout flares . Patient self care activities - Over the next 90 days, patient will: o Continue medications  as directed  Medication management . Pharmacist Clinical Goal(s): o Over the next 90 days, patient will work with PharmD and providers to maintain optimal medication adherence . Current pharmacy: [facility] mail order . Interventions o Comprehensive medication review performed. o Continue current medication management strategy . Patient self care activities - Over the next 90 days, patient will: o Focus on medication adherence by fill date o Take medications as prescribed o Report any questions or concerns to PharmD and/or provider(s)  Please see past updates related to this goal by clicking on the "Past Updates" button in the selected goal       Hypertension   BP goal is:  <140/90  Office blood pressures are  BP Readings from Last 3 Encounters:  08/05/20 (!) 160/90  02/03/20 (!) 154/78  08/06/19 138/76   Kidney Function Lab Results  Component Value Date/Time   CREATININE 1.20 (H) 08/05/2020 01:45 PM   CREATININE 1.18 02/03/2020 01:56 PM   CREATININE 1.31 08/06/2019 02:17 PM   GFR 59.68 (L) 02/03/2020 01:56 PM   GFRNONAA 58 (L) 08/05/2020 01:45 PM   GFRAA 67 08/05/2020 01:45 PM   K 4.4  08/05/2020 01:45 PM   K 4.8 02/03/2020 01:56 PM   Patient checks BP at home daily Patient home BP readings are ranging: 116/75 - 150/85  Patient has failed these meds in the past: Amlodipine, azilsartan, HCTZ, itemization, valsartan  Patient is currently controlled on the following medications:  . Chlorthalidone 25 mg daily -AM . Edarbi 80 mg daily - PM . Metoprolol XL 25 mg daily - PM  We discussed: pt is using samples of Edarbi, he has not tried to check price with pharmacy yet. Per insurance formulary [name] is not a preferred product and patient is in donut hole so likely very expensive. Pt will contact office if he [name] is too costly for him.  Preferred ARB per formulary: -Irbesartan (Tier 1, $0 thru mail order) -Telmisartan (Tier 2, $0 thru mail order) -Olmesartan (Tier 2, $0  thru mail order)  Plan  Continue current medications and control with diet and exercise  Recommend switch to preferred ARB if Edarbi is too costly  Hyperlipidemia   LDL goal < 100  Lipid Panel     Component Value Date/Time   CHOL 117 02/03/2020 1356   TRIG 168.0 (H) 02/03/2020 1356   HDL 34.20 (L) 02/03/2020 1356   LDLCALC 50 02/03/2020 1356    Hepatic Function Latest Ref Rng & Units 02/03/2020 01/21/2019 01/22/2018  Total Protein 6.0 - 8.3 g/dL 6.9 7.0 6.8  Albumin 3.5 - 5.2 g/dL 4.5 4.6 4.4  AST 0 - 37 U/L _0 ALT 0 - 53 U/L _1 Alk Phosphatase 39 - 117 U/L 60 54 61  Total Bilirubin 0.2 - 1.2 mg/dL 0.7 0.7 0.9  Bilirubin, Direct 0.0 - 0.3 mg/dL 0.1 - -     The ASCVD Risk score (Capac., et al., 2013) failed to calculate for the following reasons:   The valid total cholesterol range is 130 to 320 mg/dL   Patient has failed these meds in past: n/a Patient is currently controlled on the following medications:  . Atorvastatin 40 mg daily   CoQ10 liquid  We discussed:  Cholesterol goals, benefits of statin for ASCVD prevention; pt denies side effects/issues with statin  Plan  Continue current medications and control with diet and exercise  Pancreatic insufficiency    Patient has failed these meds in past: n/a Patient is currently controlled on the following medications:   Creon 36,000 units 1 capsule TID  We discussed:  Pt was denied PAP due to income too high. He continues to take Creon and diarrhea is much improved.  Plan  Continue current medications   Gout    Ref. Range 08/05/2020 13:45  Uric Acid, Serum Latest Ref Range: 4.0 - 8.0 mg/dL 7.5   Patient has failed these meds in past: n/a Patient is currently controlled on the following medications:  . Allopurinol 100 mg daily . Mitigare 0.6 mg PRN . Naproxen 500 mg PRN  We discussed: pt had still been taking Mitigare and naproxen daily since gout flare several months ago. Gout flare has  resolved, advised pt to stop taking these and keep as needed for flares. Advised to continue allopurinol to reduce uric acid and prevent flares.  Plan  Recommended to stop taking Mitigare and naproxen daily, but keep for flares Continue allopurinol 100 mg daily  Medication Management   Pt uses [facility] for all medications Uses pill box? No - prefers bottles Pt endorses 100% compliance  We discussed: Generics are $0 with [employer]  order; pt is satisfied with current pharmacy  Plan  Continue current medication management strategy    Follow up: 3 month phone visit  Charlene Brooke, PharmD, [facility] Clinical Pharmacist [facility] at [facility] [phone]

## 2020-09-01 ENCOUNTER — Other Ambulatory Visit: Payer: Self-pay | Admitting: Cardiology

## 2020-09-01 DIAGNOSIS — I10 Essential (primary) hypertension: Secondary | ICD-10-CM

## 2020-09-01 NOTE — Telephone Encounter (Signed)
Outpatient Medication Detail   Disp Refills Start End   metoprolol succinate (TOPROL-XL) 25 MG 24 hr tablet 90 tablet 3 11/10/2019    Sig: TAKE 1 TABLET DAILY. TAKE WITH OR IMMEDIATELY FOLLOWING A MEAL.   Sent to pharmacy as: metoprolol succinate (TOPROL-XL) 25 MG 24 hr tablet   E-Prescribing Status: Receipt confirmed by pharmacy (11/10/2019  4:31 PM EST)   Associated Diagnoses  Essential hypertension, benign     Pharmacy  [facility], [place]

## 2020-09-16 ENCOUNTER — Other Ambulatory Visit: Payer: Self-pay | Admitting: Internal Medicine

## 2020-09-16 DIAGNOSIS — E785 Hyperlipidemia, unspecified: Secondary | ICD-10-CM

## 2020-09-16 DIAGNOSIS — I10 Essential (primary) hypertension: Secondary | ICD-10-CM

## 2020-09-17 ENCOUNTER — Other Ambulatory Visit: Payer: Self-pay | Admitting: Internal Medicine

## 2020-09-17 ENCOUNTER — Encounter: Payer: Self-pay | Admitting: Internal Medicine

## 2020-09-17 DIAGNOSIS — I739 Peripheral vascular disease, unspecified: Secondary | ICD-10-CM | POA: Diagnosis not present

## 2020-09-17 DIAGNOSIS — L84 Corns and callosities: Secondary | ICD-10-CM | POA: Diagnosis not present

## 2020-09-17 DIAGNOSIS — L603 Nail dystrophy: Secondary | ICD-10-CM | POA: Diagnosis not present

## 2020-09-17 DIAGNOSIS — K8681 Exocrine pancreatic insufficiency: Secondary | ICD-10-CM

## 2020-09-20 ENCOUNTER — Other Ambulatory Visit: Payer: Self-pay | Admitting: Internal Medicine

## 2020-09-20 DIAGNOSIS — I10 Essential (primary) hypertension: Secondary | ICD-10-CM

## 2020-09-20 DIAGNOSIS — E118 Type 2 diabetes mellitus with unspecified complications: Secondary | ICD-10-CM

## 2020-09-20 MED ORDER — EDARBI 80 MG PO TABS: 1.0000 | ORAL_TABLET | Freq: Every day | ORAL | 1 refills | Status: DC

## 2020-09-22 ENCOUNTER — Telehealth: Payer: Self-pay | Admitting: Pharmacist

## 2020-09-22 ENCOUNTER — Other Ambulatory Visit: Payer: Self-pay | Admitting: Internal Medicine

## 2020-09-22 DIAGNOSIS — I10 Essential (primary) hypertension: Secondary | ICD-10-CM

## 2020-09-22 DIAGNOSIS — E118 Type 2 diabetes mellitus with unspecified complications: Secondary | ICD-10-CM

## 2020-09-22 MED ORDER — OLMESARTAN MEDOXOMIL 40 MG PO TABS: 40.0000 mg | ORAL_TABLET | Freq: Every day | ORAL | 1 refills | Status: AC

## 2020-09-22 NOTE — Progress Notes (Signed)
Received call from patient to discuss [place] cost. He reports cost is over $600 and he does not qualify for patient assistance. Per insurance formulary other ARBs are available for $0 copay through mail order.  $0 copay ([employer] mail) Olmesartan Telmisartan  Plan: Recommend switch to olmesartan or telmisartan for cost savings at discretion of PCP

## 2020-11-04 ENCOUNTER — Encounter: Payer: Self-pay | Admitting: Internal Medicine

## 2020-11-04 ENCOUNTER — Other Ambulatory Visit: Payer: Self-pay

## 2020-11-04 ENCOUNTER — Ambulatory Visit (INDEPENDENT_AMBULATORY_CARE_PROVIDER_SITE_OTHER): Payer: Medicare HMO | Admitting: Internal Medicine

## 2020-11-04 VITALS — BP 128/84 | HR 67 | Temp 98.4°F | Ht 76.0 in | Wt 266.0 lb

## 2020-11-04 DIAGNOSIS — I10 Essential (primary) hypertension: Secondary | ICD-10-CM | POA: Diagnosis not present

## 2020-11-04 DIAGNOSIS — E118 Type 2 diabetes mellitus with unspecified complications: Secondary | ICD-10-CM

## 2020-11-04 DIAGNOSIS — D75839 Thrombocytosis, unspecified: Secondary | ICD-10-CM | POA: Diagnosis not present

## 2020-11-04 LAB — CBC WITH DIFFERENTIAL/PLATELET
Basophils Absolute: 0.1 10*3/uL (ref 0.0–0.1)
Basophils Relative: 0.6 % (ref 0.0–3.0)
Eosinophils Absolute: 0.5 10*3/uL (ref 0.0–0.7)
Eosinophils Relative: 4.1 % (ref 0.0–5.0)
HCT: 45.2 % (ref 39.0–52.0)
Hemoglobin: 14.8 g/dL (ref 13.0–17.0)
Lymphocytes Relative: 11.1 % — ABNORMAL LOW (ref 12.0–46.0)
Lymphs Abs: 1.4 10*3/uL (ref 0.7–4.0)
MCHC: 32.7 g/dL (ref 30.0–36.0)
MCV: 80.1 fl (ref 78.0–100.0)
Monocytes Absolute: 1.1 10*3/uL — ABNORMAL HIGH (ref 0.1–1.0)
Monocytes Relative: 8.9 % (ref 3.0–12.0)
Neutro Abs: 9.3 10*3/uL — ABNORMAL HIGH (ref 1.4–7.7)
Neutrophils Relative %: 75.3 % (ref 43.0–77.0)
Platelets: 594 10*3/uL — ABNORMAL HIGH (ref 150.0–400.0)
RBC: 5.65 Mil/uL (ref 4.22–5.81)
RDW: 15 % (ref 11.5–15.5)
WBC: 12.4 10*3/uL — ABNORMAL HIGH (ref 4.0–10.5)

## 2020-11-04 LAB — BASIC METABOLIC PANEL
BUN: 28 mg/dL — ABNORMAL HIGH (ref 6–23)
CO2: 29 mEq/L (ref 19–32)
Calcium: 10 mg/dL (ref 8.4–10.5)
Chloride: 101 mEq/L (ref 96–112)
Creatinine, Ser: 1.33 mg/dL (ref 0.40–1.50)
GFR: 51.05 mL/min — ABNORMAL LOW (ref >60.00)
Glucose, Bld: 84 mg/dL (ref 70–99)
Potassium: 3.9 mEq/L (ref 3.5–5.1)
Sodium: 138 mEq/L (ref 135–145)

## 2020-11-04 LAB — HEMOGLOBIN A1C: Hgb A1c MFr Bld: 6.7 % — ABNORMAL HIGH (ref 4.6–6.5)

## 2020-11-04 MED ORDER — METOPROLOL SUCCINATE ER 25 MG PO TB24: 25.0000 mg | ORAL_TABLET | Freq: Every day | ORAL | 1 refills | Status: AC

## 2020-11-04 NOTE — Progress Notes (Signed)
Subjective:  Patient ID: Thomas Chung, male    DOB: 1941/10/23  Age: 79 y.o. MRN: [id number]  CC: Hypertension and Diabetes  This visit occurred during the SARS-CoV-2 public health emergency.  Safety protocols were in place, including screening questions prior to the visit, additional usage of staff PPE, and extensive cleaning of exam room while observing appropriate contact time as indicated for disinfecting solutions.    HPI Thomas Chung presents for f/up - He walks about 3 miles a day and does not experience CP, DOE, palpitations, edema, fatigue, dizziness, lightheadedness, or near syncope.  He tells me his blood pressure and blood sugar have been well controlled.  Outpatient Medications Prior to Visit  Medication Sig Dispense Refill  . Accu-Chek Softclix Lancets lancets Use to check blood sugar daily. DX: E11.8 100 each 3  . Alcohol Swabs (B-D SINGLE USE SWABS REGULAR) PADS Inject 1 Act into the skin See admin instructions. 300 each 2  . allopurinol (ZYLOPRIM) 100 MG tablet Take 1 tablet (100 mg total) by mouth daily. 90 tablet 1  . atorvastatin (LIPITOR) 40 MG tablet TAKE 1 TABLET EVERY DAY 90 tablet 0  . B Complex-C-Folic Acid (SUPER B COMPLEX/FA/VIT C) TABS Take by mouth.    . blood glucose meter kit and supplies KIT Dispense based on patient and insurance preference. Use to check to blood sugar daily. DX: E11.9 1 each 0  . chlorthalidone (HYGROTON) 25 MG tablet TAKE 1 TABLET EVERY DAY 90 tablet 0  . Coenzyme Q10 (COQ10 PO)     . CREON 36000-114000 units CPEP capsule TAKE 1 CAPSULE THREE TIMES DAILY WITH MEALS 200 capsule 5  . cyanocobalamin 1000 MCG tablet Take 1 tablet (1,000 mcg total) by mouth daily. 90 tablet 1  . glucose blood test strip Use to check to blood sugar daily. DX: E11.9 100 each 3  . loperamide (IMODIUM) 2 MG capsule Take by mouth as needed for diarrhea or loose stools.    Marland Kitchen MITIGARE 0.6 MG CAPS Take 1 capsule by mouth as needed.     Marland Kitchen olmesartan (BENICAR)  40 MG tablet Take 1 tablet (40 mg total) by mouth daily. 90 tablet 1  . tadalafil (CIALIS) 5 MG tablet Take 1 tablet (5 mg total) by mouth daily as needed for erectile dysfunction. 90 tablet 1  . metoprolol succinate (TOPROL-XL) 25 MG 24 hr tablet TAKE 1 TABLET DAILY. TAKE WITH OR IMMEDIATELY FOLLOWING A MEAL. 90 tablet 3   No facility-administered medications prior to visit.    ROS Review of Systems  Constitutional: Negative.  Negative for chills, diaphoresis, fatigue and fever.  HENT: Negative.  Negative for trouble swallowing.   Respiratory: Negative.  Negative for cough, shortness of breath and wheezing.   Cardiovascular: Negative for chest pain, palpitations and leg swelling.  Gastrointestinal: Negative for abdominal pain, constipation, diarrhea, nausea and vomiting.  Endocrine: Negative.   Genitourinary: Negative.   Musculoskeletal: Positive for arthralgias. Negative for back pain and myalgias.  Skin: Negative.  Negative for color change and pallor.  Neurological: Negative.  Negative for dizziness, weakness and light-headedness.  Hematological: Negative for adenopathy. Does not bruise/bleed easily.  Psychiatric/Behavioral: Negative.     Objective:  BP 128/84   Pulse 67   Temp 98.4 F (36.9 C) (Oral)   Ht $R'6\' 4"'SF$  (1.93 m)   Wt 266 lb (120.7 kg)   SpO2 95%   BMI 32.38 kg/m   BP Readings from Last 3 Encounters:  11/04/20 128/84  08/05/20 Marland Kitchen)  160/90  02/03/20 (!) 154/78    Wt Readings from Last 3 Encounters:  11/04/20 266 lb (120.7 kg)  08/05/20 268 lb (121.6 kg)  02/03/20 268 lb 8 oz (121.8 kg)    Physical Exam Vitals reviewed.  HENT:     Nose: Nose normal.     Mouth/Throat:     Mouth: Mucous membranes are moist.  Eyes:     General: No scleral icterus.    Conjunctiva/sclera: Conjunctivae normal.  Cardiovascular:     Rate and Rhythm: Normal rate and regular rhythm.     Heart sounds: No murmur heard.   Pulmonary:     Effort: Pulmonary effort is normal.      Breath sounds: No stridor. No wheezing, rhonchi or rales.  Abdominal:     General: Abdomen is flat. Bowel sounds are normal. There is no distension.     Palpations: Abdomen is soft. There is no hepatomegaly, splenomegaly or mass.     Tenderness: There is no abdominal tenderness.  Musculoskeletal:        General: Normal range of motion.     Cervical back: Neck supple.     Right lower leg: No edema.     Left lower leg: No edema.  Skin:    General: Skin is warm and dry.     Findings: No bruising.  Neurological:     General: No focal deficit present.     Mental Status: He is alert and oriented to person, place, and time.     Lab Results  Component Value Date   WBC 12.4 (H) 11/04/2020   HGB 14.8 11/04/2020   HCT 45.2 11/04/2020   PLT 594.0 (H) 11/04/2020   GLUCOSE 84 11/04/2020   CHOL 117 02/03/2020   TRIG 168.0 (H) 02/03/2020   HDL 34.20 (L) 02/03/2020   LDLCALC 50 02/03/2020   ALT 26 02/03/2020   AST 23 02/03/2020   NA 138 11/04/2020   K 3.9 11/04/2020   CL 101 11/04/2020   CREATININE 1.33 11/04/2020   BUN 28 (H) 11/04/2020   CO2 29 11/04/2020   TSH 1.46 02/03/2020   PSA 0.09 (L) 02/03/2020   HGBA1C 6.7 (H) 11/04/2020   MICROALBUR 1.3 02/03/2020    VAS [place] LOWER EXTREMITY VENOUS (DVT)  Result Date: 01/28/2018  Lower Venous Study Indication: Pain, Swelling, and Gout. Examination Guidelines: A complete evaluation includes B-mode imaging, spectral doppler, color doppler, and power doppler as needed of all accessible portions of each vessel. Bilateral testing is considered an integral part of a complete examination. Limited examinations for reoccurring indications may be performed as noted. No prior study on file for comparison.  Right Venous Findings: +---+---------------+---------+-----------+----------+-------+    CompressibilityPhasicitySpontaneityPropertiesSummary +---+---------------+---------+-----------+----------+-------+ CFVFull           Yes      Yes                           +---+---------------+---------+-----------+----------+-------+  Left Venous Findings: +---------+---------------+---------+-----------+----------+-------+          CompressibilityPhasicitySpontaneityPropertiesSummary +---------+---------------+---------+-----------+----------+-------+ CFV      Full           Yes      Yes                          +---------+---------------+---------+-----------+----------+-------+ FV Prox  Full                                                 +---------+---------------+---------+-----------+----------+-------+  FV Mid   Full                                                 +---------+---------------+---------+-----------+----------+-------+ FV DistalFull                                                 +---------+---------------+---------+-----------+----------+-------+ PFV      Full           Yes      Yes                          +---------+---------------+---------+-----------+----------+-------+ POP      Full           Yes      Yes                          +---------+---------------+---------+-----------+----------+-------+ PTV      Full                                                 +---------+---------------+---------+-----------+----------+-------+ PERO     Full                                                 +---------+---------------+---------+-----------+----------+-------+ GSV      Full                                                 +---------+---------------+---------+-----------+----------+-------+    Final Interpretation: Right: No evidence of common femoral vein obstruction. Left: Ultrasound characteristics of enlarged lymph nodes noted in the groin. There is no evidence of deep vein thrombosis in the lower extremity.There is no evidence of superficial venous thrombosis.  *See table(s) above for measurements and observations. Electronically signed by Ruta Hinds on 01/28/2018 at  8:49:09 PM.     Assessment & Plan:   Hilmer was seen today for hypertension and diabetes.  Diagnoses and all orders for this visit:  Type II diabetes mellitus with manifestations ([place])- His blood sugar is adequately well controlled. -     Basic metabolic panel; Future -     Hemoglobin A1c; Future -     HM Diabetes Foot Exam -     Hemoglobin A1c -     Basic metabolic panel  Essential hypertension, benign- His blood pressure is well controlled. -     metoprolol succinate (TOPROL-XL) 25 MG 24 hr tablet; Take 1 tablet (25 mg total) by mouth daily. -     CBC with Differential/Platelet; Future -     Basic metabolic panel; Future -     Basic metabolic panel -     CBC with Differential/Platelet  Thrombocytosis- His platelet count and white cell count continued to be mildly elevated.  He is asymptomatic with  this.  He is not anemic and his other cell lines are normal.   Will continue to monitor his platelet count and white cell count every 4 to 6 months. -     CBC with Differential/Platelet; Future -     CBC with Differential/Platelet   I have changed Thomas Chung "[name]"'s metoprolol succinate. I am also having him maintain his B-D SINGLE USE SWABS REGULAR, loperamide, tadalafil, Coenzyme Q10 (COQ10 PO), cyanocobalamin, Super B Complex/FA/Vit C, Accu-Chek Softclix Lancets, blood glucose meter kit and supplies, glucose blood, Mitigare, allopurinol, chlorthalidone, atorvastatin, Creon, and olmesartan.  Meds ordered this encounter  Medications  . metoprolol succinate (TOPROL-XL) 25 MG 24 hr tablet    Sig: Take 1 tablet (25 mg total) by mouth daily.    Dispense:  90 tablet    Refill:  1     Follow-up: Return in about 6 months (around 05/05/2021).  Scarlette Calico, MD

## 2020-11-04 NOTE — Patient Instructions (Signed)
Type 2 Diabetes Mellitus, Diagnosis, Adult Type 2 diabetes (type 2 diabetes mellitus) is a long-term (chronic) disease. In type 2 diabetes, one or both of these problems may be present:  The pancreas does not make enough of a hormone called insulin.  Cells in the body do not respond properly to insulin that the body makes (insulin resistance). Normally, insulin allows blood sugar (glucose) to enter cells in the body. The cells use glucose for energy. Insulin resistance or lack of insulin causes excess glucose to build up in the blood instead of going into cells. As a result, high blood glucose (hyperglycemia) develops. What increases the risk? The following factors may make you more likely to develop type 2 diabetes:  Having a family member with type 2 diabetes.  Being overweight or obese.  Having an inactive (sedentary) lifestyle.  Having been diagnosed with insulin resistance.  Having a history of prediabetes, gestational diabetes, or polycystic ovary syndrome (PCOS).  Being of American-Indian, African-American, Hispanic/Latino, or Asian/Pacific Islander descent. What are the signs or symptoms? In the early stage of this condition, you may not have symptoms. Symptoms develop slowly and may include:  Increased thirst (polydipsia).  Increased hunger(polyphagia).  Increased urination (polyuria).  Increased urination during the night (nocturia).  Unexplained weight loss.  Frequent infections that keep coming back (recurring).  Fatigue.  Weakness.  Vision changes, such as blurry vision.  Cuts or bruises that are slow to heal.  Tingling or numbness in the hands or feet.  Dark patches on the skin (acanthosis nigricans). How is this diagnosed? This condition is diagnosed based on your symptoms, your medical history, a physical exam, and your blood glucose level. Your blood glucose may be checked with one or more of the following blood tests:  A fasting blood glucose (FBG)  test. You will not be allowed to eat (you will fast) for 8 hours or longer before a blood sample is taken.  A random blood glucose test. This test checks blood glucose at any time of day regardless of when you ate.  An A1c (hemoglobin A1c) blood test. This test provides information about blood glucose control over the previous 2-3 months.  An oral glucose tolerance test (OGTT). This test measures your blood glucose at two times: ? After fasting. This is your baseline blood glucose level. ? Two hours after drinking a beverage that contains glucose. You may be diagnosed with type 2 diabetes if:  Your FBG level is 126 mg/dL (7.0 mmol/L) or higher.  Your random blood glucose level is 200 mg/dL (11.1 mmol/L) or higher.  Your A1c level is 6.5% or higher.  Your OGTT result is higher than 200 mg/dL (11.1 mmol/L). These blood tests may be repeated to confirm your diagnosis. How is this treated? Your treatment may be managed by a specialist called an endocrinologist. Type 2 diabetes may be treated by following instructions from your health care provider about:  Making diet and lifestyle changes. This may include: ? Following an individualized nutrition plan that is developed by a diet and nutrition specialist (registered dietitian). ? Exercising regularly. ? Finding ways to manage stress.  Checking your blood glucose level as often as told.  Taking diabetes medicines or insulin daily. This helps to keep your blood glucose levels in the healthy range. ? If you use insulin, you may need to adjust the dosage depending on how physically active you are and what foods you eat. Your health care provider will tell you how to adjust your dosage.    Taking medicines to help prevent complications from diabetes, such as: ? Aspirin. ? Medicine to lower cholesterol. ? Medicine to control blood pressure. Your health care provider will set individualized treatment goals for you. Your goals will be based on  your age, other medical conditions you have, and how you respond to diabetes treatment. Generally, the goal of treatment is to maintain the following blood glucose levels:  Before meals (preprandial): 80-130 mg/dL (4.4-7.2 mmol/L).  After meals (postprandial): below 180 mg/dL (10 mmol/L).  A1c level: less than 7%. Follow these instructions at home: Questions to ask your health care provider  Consider asking the following questions: ? Do I need to meet with a diabetes educator? ? Where can I find a support group for people with diabetes? ? What equipment will I need to manage my diabetes at home? ? What diabetes medicines do I need, and when should I take them? ? How often do I need to check my blood glucose? ? What number can I call if I have questions? ? When is my next appointment? General instructions  Take over-the-counter and prescription medicines only as told by your health care provider.  Keep all follow-up visits as told by your health care provider. This is important.  For more information about diabetes, visit: ? American Diabetes Association (ADA): www.diabetes.org ? American Association of Diabetes Educators (AADE): www.diabeteseducator.org Contact a health care provider if:  Your blood glucose is at or above 240 mg/dL (13.3 mmol/L) for 2 days in a row.  You have been sick or have had a fever for 2 days or longer, and you are not getting better.  You have any of the following problems for more than 6 hours: ? You cannot eat or drink. ? You have nausea and vomiting. ? You have diarrhea. Get help right away if:  Your blood glucose is lower than 54 mg/dL (3.0 mmol/L).  You become confused or you have trouble thinking clearly.  You have difficulty breathing.  You have moderate or large ketone levels in your urine. Summary  Type 2 diabetes (type 2 diabetes mellitus) is a long-term (chronic) disease. In type 2 diabetes, the pancreas does not make enough of a  hormone called insulin, or cells in the body do not respond properly to insulin that the body makes (insulin resistance).  This condition is treated by making diet and lifestyle changes and taking diabetes medicines or insulin.  Your health care provider will set individualized treatment goals for you. Your goals will be based on your age, other medical conditions you have, and how you respond to diabetes treatment.  Keep all follow-up visits as told by your health care provider. This is important. This information is not intended to replace advice given to you by your health care provider. Make sure you discuss any questions you have with your health care provider. Document Revised: 01/04/2018 Document Reviewed: 12/10/2015 Elsevier Patient Education  2020 Elsevier Inc.  

## 2020-11-10 ENCOUNTER — Telehealth: Payer: Self-pay | Admitting: Pharmacist

## 2020-11-10 NOTE — Progress Notes (Signed)
Chronic Care Management Pharmacy Assistant   Name: FRED FRANZEN  MRN: [id number] DOB: [birth year]  Reason for Encounter: General Adherence Call   PCP : Janith Lima, MD  Allergies:   Allergies  Allergen Reactions  . Amlodipine Swelling  . Prevnar [Pneumococcal 13-Val Conj Vacc]     Joint pain, swelling    Medications: Outpatient Encounter Medications as of 11/10/2020  Medication Sig  . Accu-Chek Softclix Lancets lancets Use to check blood sugar daily. DX: E11.8  . Alcohol Swabs (B-D SINGLE USE SWABS REGULAR) PADS Inject 1 Act into the skin See admin instructions.  Marland Kitchen allopurinol (ZYLOPRIM) 100 MG tablet Take 1 tablet (100 mg total) by mouth daily.  Marland Kitchen atorvastatin (LIPITOR) 40 MG tablet TAKE 1 TABLET EVERY DAY  . B Complex-C-Folic Acid (SUPER B COMPLEX/FA/VIT C) TABS Take by mouth.  . blood glucose meter kit and supplies KIT Dispense based on patient and insurance preference. Use to check to blood sugar daily. DX: E11.9  . chlorthalidone (HYGROTON) 25 MG tablet TAKE 1 TABLET EVERY DAY  . Coenzyme Q10 (COQ10 PO)   . CREON 36000-114000 units CPEP capsule TAKE 1 CAPSULE THREE TIMES DAILY WITH MEALS  . cyanocobalamin 1000 MCG tablet Take 1 tablet (1,000 mcg total) by mouth daily.  Marland Kitchen glucose blood test strip Use to check to blood sugar daily. DX: E11.9  . loperamide (IMODIUM) 2 MG capsule Take by mouth as needed for diarrhea or loose stools.  . metoprolol succinate (TOPROL-XL) 25 MG 24 hr tablet Take 1 tablet (25 mg total) by mouth daily.  Marland Kitchen MITIGARE 0.6 MG CAPS Take 1 capsule by mouth as needed.   Marland Kitchen olmesartan (BENICAR) 40 MG tablet Take 1 tablet (40 mg total) by mouth daily.  . tadalafil (CIALIS) 5 MG tablet Take 1 tablet (5 mg total) by mouth daily as needed for erectile dysfunction.  . [DISCONTINUED] atorvastatin (LIPITOR) 40 MG tablet TAKE 1 TABLET EVERY DAY  . [DISCONTINUED] chlorthalidone (HYGROTON) 25 MG tablet TAKE 1 TABLET EVERY DAY  . [DISCONTINUED]  lipase/protease/amylase (CREON) 36000 UNITS CPEP capsule Take 1 capsule (36,000 Units total) by mouth 3 (three) times daily with meals.  . [DISCONTINUED] losartan (COZAAR) 100 MG tablet TAKE 1 TABLET EVERY DAY   No facility-administered encounter medications on file as of 11/10/2020.    Current Diagnosis: Patient Active Problem List   Diagnosis Date Noted  . Gout 08/05/2020  . Need for hepatitis C screening test 08/05/2020  . Onychomycosis of multiple toenails with type 2 diabetes mellitus ([place]) 08/06/2019  . 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 B12 deficiency 10/31/2018  . Thrombocytosis 10/29/2018  . GERD without esophagitis 01/22/2018  . Asymptomatic gallstones 09/09/2015  . Routine general medical examination at a health care facility 02/10/2015  . Prostate nodule with urinary obstruction 07/17/2011  . Type II diabetes mellitus with manifestations ([place]) 08/05/2009  . Diabetic neuropathy, painful ([place]) 07/06/2009  . Hyperlipidemia with target LDL less than 100 04/22/2009  . Essential hypertension, benign 04/22/2009  . Erectile dysfunction associated with type 2 diabetes mellitus ([place]) 04/21/2009    Goals Addressed   None     Follow-Up:  Pharmacist Review   A general adherence wellness call was made to Mr. Hancock to see how he has been doing since his last encounter with the clinical pharmacist Mendel Ryder. The patient states that he is doing well. He is not having any issues with his health at this time. He stated that his blood pressure is under control  with medication diet and exercise. The patient states that over all he does not have any new health problems. I let the patient know that I would pass along the information to the clinical pharmacist Mendel Ryder.   Wendy Poet, Clinical Pharmacist Assistant [facility]

## 2020-11-15 ENCOUNTER — Other Ambulatory Visit: Payer: Self-pay | Admitting: Internal Medicine

## 2020-11-15 DIAGNOSIS — M1A09X0 Idiopathic chronic gout, multiple sites, without tophus (tophi): Secondary | ICD-10-CM

## 2020-11-15 DIAGNOSIS — I10 Essential (primary) hypertension: Secondary | ICD-10-CM

## 2020-11-15 MED ORDER — CHLORTHALIDONE 25 MG PO TABS: 25.0000 mg | ORAL_TABLET | Freq: Every day | ORAL | 1 refills | Status: AC

## 2020-11-15 MED ORDER — ALLOPURINOL 100 MG PO TABS: 100.0000 mg | ORAL_TABLET | Freq: Every day | ORAL | 1 refills | Status: DC

## 2020-11-16 DIAGNOSIS — H02831 Dermatochalasis of right upper eyelid: Secondary | ICD-10-CM | POA: Diagnosis not present

## 2020-11-16 DIAGNOSIS — E119 Type 2 diabetes mellitus without complications: Secondary | ICD-10-CM | POA: Diagnosis not present

## 2020-11-16 DIAGNOSIS — Z961 Presence of intraocular lens: Secondary | ICD-10-CM | POA: Diagnosis not present

## 2020-11-16 DIAGNOSIS — H35033 Hypertensive retinopathy, bilateral: Secondary | ICD-10-CM | POA: Diagnosis not present

## 2020-11-18 ENCOUNTER — Telehealth: Payer: Self-pay | Admitting: Pharmacist

## 2020-11-18 NOTE — Progress Notes (Signed)
    Chronic Care Management Pharmacy Assistant   Name: Thomas Chung  MRN: [id number] DOB: Jul 30, 1941  Reason for Encounter: Chart Review   PCP : Janith Lima, MD  Allergies:   Allergies  Allergen Reactions  . Amlodipine Swelling  . Prevnar [Pneumococcal 13-Val Conj Vacc]     Joint pain, swelling    Medications: Outpatient Encounter Medications as of 11/18/2020  Medication Sig  . Accu-Chek Softclix Lancets lancets Use to check blood sugar daily. DX: E11.8  . Alcohol Swabs (B-D SINGLE USE SWABS REGULAR) PADS Inject 1 Act into the skin See admin instructions.  Marland Kitchen allopurinol (ZYLOPRIM) 100 MG tablet Take 1 tablet (100 mg total) by mouth daily.  Marland Kitchen atorvastatin (LIPITOR) 40 MG tablet TAKE 1 TABLET EVERY DAY  . B Complex-C-Folic Acid (SUPER B COMPLEX/FA/VIT C) TABS Take by mouth.  . blood glucose meter kit and supplies KIT Dispense based on patient and insurance preference. Use to check to blood sugar daily. DX: E11.9  . chlorthalidone (HYGROTON) 25 MG tablet Take 1 tablet (25 mg total) by mouth daily.  Marland Kitchen CREON 36000-114000 units CPEP capsule TAKE 1 CAPSULE THREE TIMES DAILY WITH MEALS  . cyanocobalamin 1000 MCG tablet Take 1 tablet (1,000 mcg total) by mouth daily.  Marland Kitchen glucose blood test strip Use to check to blood sugar daily. DX: E11.9  . loperamide (IMODIUM) 2 MG capsule Take by mouth as needed for diarrhea or loose stools.  . metoprolol succinate (TOPROL-XL) 25 MG 24 hr tablet Take 1 tablet (25 mg total) by mouth daily.  Marland Kitchen MITIGARE 0.6 MG CAPS Take 1 capsule by mouth as needed.   Marland Kitchen olmesartan (BENICAR) 40 MG tablet Take 1 tablet (40 mg total) by mouth daily.  . tadalafil (CIALIS) 5 MG tablet Take 1 tablet (5 mg total) by mouth daily as needed for erectile dysfunction.   No facility-administered encounter medications on file as of 11/18/2020.    Current Diagnosis: Patient Active Problem List   Diagnosis Date Noted  . Gout 08/05/2020  . Need for hepatitis C screening  test 08/05/2020  . Onychomycosis of multiple toenails with type 2 diabetes mellitus ([place]) 08/06/2019  . 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 B12 deficiency 10/31/2018  . Thrombocytosis 10/29/2018  . GERD without esophagitis 01/22/2018  . Asymptomatic gallstones 09/09/2015  . Routine general medical examination at a health care facility 02/10/2015  . Prostate nodule with urinary obstruction 07/17/2011  . Type II diabetes mellitus with manifestations ([place]) 08/05/2009  . Diabetic neuropathy, painful ([place]) 07/06/2009  . Hyperlipidemia with target LDL less than 100 04/22/2009  . Essential hypertension, benign 04/22/2009  . Erectile dysfunction associated with type 2 diabetes mellitus ([place]) 04/21/2009    Goals Addressed   None     Follow-Up:  Pharmacist Review   Reviewed chart for medication changes and adherence.  No OVs, consults, hospital visit since last care coordination call/pharmacist visit No medication changes indicated  No gaps in adherence identified. Patient has follow up scheduled with pharmacy team. No further action required.   Wendy Poet, Clinical Pharmacist Assistant [facility]

## 2020-11-23 DIAGNOSIS — L918 Other hypertrophic disorders of the skin: Secondary | ICD-10-CM | POA: Diagnosis not present

## 2020-11-23 DIAGNOSIS — C44629 Squamous cell carcinoma of skin of left upper limb, including shoulder: Secondary | ICD-10-CM | POA: Diagnosis not present

## 2020-11-23 DIAGNOSIS — L821 Other seborrheic keratosis: Secondary | ICD-10-CM | POA: Diagnosis not present

## 2020-11-23 DIAGNOSIS — D225 Melanocytic nevi of trunk: Secondary | ICD-10-CM | POA: Diagnosis not present

## 2020-11-23 DIAGNOSIS — L812 Freckles: Secondary | ICD-10-CM | POA: Diagnosis not present

## 2020-11-23 DIAGNOSIS — D485 Neoplasm of uncertain behavior of skin: Secondary | ICD-10-CM | POA: Diagnosis not present

## 2020-11-23 DIAGNOSIS — L570 Actinic keratosis: Secondary | ICD-10-CM | POA: Diagnosis not present

## 2020-11-23 DIAGNOSIS — D2271 Melanocytic nevi of right lower limb, including hip: Secondary | ICD-10-CM | POA: Diagnosis not present

## 2020-11-23 DIAGNOSIS — Z85828 Personal history of other malignant neoplasm of skin: Secondary | ICD-10-CM | POA: Diagnosis not present

## 2020-11-23 DIAGNOSIS — Z85820 Personal history of malignant melanoma of skin: Secondary | ICD-10-CM | POA: Diagnosis not present

## 2020-11-24 DIAGNOSIS — Z03818 Encounter for observation for suspected exposure to other biological agents ruled out: Secondary | ICD-10-CM | POA: Diagnosis not present

## 2020-11-25 ENCOUNTER — Telehealth: Payer: Medicare HMO

## 2020-11-29 ENCOUNTER — Other Ambulatory Visit: Payer: Self-pay | Admitting: Internal Medicine

## 2020-11-29 DIAGNOSIS — E785 Hyperlipidemia, unspecified: Secondary | ICD-10-CM

## 2020-12-22 ENCOUNTER — Encounter: Payer: Self-pay | Admitting: Internal Medicine

## 2020-12-22 DIAGNOSIS — Z03818 Encounter for observation for suspected exposure to other biological agents ruled out: Secondary | ICD-10-CM | POA: Diagnosis not present

## 2020-12-23 NOTE — Telephone Encounter (Signed)
Patient called and wanted to make sure that his MyChart message was received.  Please advise.

## 2020-12-24 NOTE — Telephone Encounter (Signed)
Patient called, states he still has a fever of 101.5. Patient has been taking tylenol every 6 hours to help break the fever but it keeps spiking after medication wears off. [facility] in [facility] to get in but he would like to see what other options he can do.   Please advise.

## 2020-12-27 ENCOUNTER — Ambulatory Visit (INDEPENDENT_AMBULATORY_CARE_PROVIDER_SITE_OTHER): Payer: Medicare HMO | Admitting: Nurse Practitioner

## 2020-12-27 ENCOUNTER — Telehealth: Payer: Self-pay | Admitting: Pharmacist

## 2020-12-27 VITALS — BP 126/84 | HR 86 | Temp 98.6°F | Ht 76.0 in | Wt 261.0 lb

## 2020-12-27 DIAGNOSIS — U071 COVID-19: Secondary | ICD-10-CM | POA: Diagnosis not present

## 2020-12-27 NOTE — Assessment & Plan Note (Signed)
Stay well hydrated  Stay active  Deep breathing exercises  May take tylenol or fever or pain  May take mucinex twice daily     Follow up:  Follow up in 1 week or sooner if needed

## 2020-12-27 NOTE — Progress Notes (Signed)
Chronic Care Management Pharmacy Assistant   Name: Thomas Chung  MRN: [id number] DOB: 08/17/1941  Reason for Encounter: Diabetic Adherence Call   PCP : Janith Lima, MD  Allergies:   Allergies  Allergen Reactions  . Amlodipine Swelling  . Prevnar [Pneumococcal 13-Val Conj Vacc]     Joint pain, swelling    Medications: Outpatient Encounter Medications as of 12/27/2020  Medication Sig  . Accu-Chek Softclix Lancets lancets Use to check blood sugar daily. DX: E11.8  . Alcohol Swabs (B-D SINGLE USE SWABS REGULAR) PADS Inject 1 Act into the skin See admin instructions.  Marland Kitchen allopurinol (ZYLOPRIM) 100 MG tablet Take 1 tablet (100 mg total) by mouth daily.  Marland Kitchen atorvastatin (LIPITOR) 40 MG tablet TAKE 1 TABLET EVERY DAY  . B Complex-C-Folic Acid (SUPER B COMPLEX/FA/VIT C) TABS Take by mouth.  . blood glucose meter kit and supplies KIT Dispense based on patient and insurance preference. Use to check to blood sugar daily. DX: E11.9  . chlorthalidone (HYGROTON) 25 MG tablet Take 1 tablet (25 mg total) by mouth daily.  Marland Kitchen CREON [phone] units CPEP capsule TAKE 1 CAPSULE THREE TIMES DAILY WITH MEALS  . cyanocobalamin 1000 MCG tablet Take 1 tablet (1,000 mcg total) by mouth daily.  Marland Kitchen glucose blood test strip Use to check to blood sugar daily. DX: E11.9  . loperamide (IMODIUM) 2 MG capsule Take by mouth as needed for diarrhea or loose stools.  . metoprolol succinate (TOPROL-XL) 25 MG 24 hr tablet Take 1 tablet (25 mg total) by mouth daily.  Marland Kitchen MITIGARE 0.6 MG CAPS Take 1 capsule by mouth as needed.   Marland Kitchen olmesartan (BENICAR) 40 MG tablet Take 1 tablet (40 mg total) by mouth daily.  . tadalafil (CIALIS) 5 MG tablet Take 1 tablet (5 mg total) by mouth daily as needed for erectile dysfunction.   No facility-administered encounter medications on file as of 12/27/2020.    Current Diagnosis: Patient Active Problem List   Diagnosis Date Noted  . COVID-19 12/27/2020  . Gout 08/05/2020  .  Need for hepatitis C screening test 08/05/2020  . Onychomycosis of multiple toenails with type 2 diabetes mellitus ([place]) 08/06/2019  . 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 B12 deficiency 10/31/2018  . Thrombocytosis 10/29/2018  . GERD without esophagitis 01/22/2018  . Asymptomatic gallstones 09/09/2015  . Routine general medical examination at a health care facility 02/10/2015  . Prostate nodule with urinary obstruction 07/17/2011  . Type II diabetes mellitus with manifestations ([place]) 08/05/2009  . Diabetic neuropathy, painful ([place]) 07/06/2009  . Hyperlipidemia with target LDL less than 100 04/22/2009  . Essential hypertension, benign 04/22/2009  . Erectile dysfunction associated with type 2 diabetes mellitus ([place]) 04/21/2009    Goals Addressed   None     Follow-Up:  Pharmacist Review   Recent Relevant Labs: Lab Results  Component Value Date/Time   HGBA1C 6.7 (H) 11/04/2020 02:29 PM   HGBA1C 6.5 (H) 08/05/2020 01:45 PM   MICROALBUR 1.3 02/03/2020 01:56 PM   MICROALBUR 1.7 05/06/2019 01:41 PM    Kidney Function Lab Results  Component Value Date/Time   CREATININE 1.33 11/04/2020 02:29 PM   CREATININE 1.20 (H) 08/05/2020 01:45 PM   CREATININE 1.18 02/03/2020 01:56 PM   GFR 51.05 (L) 11/04/2020 02:29 PM   GFRNONAA 58 (L) 08/05/2020 01:45 PM   GFRAA 67 08/05/2020 01:45 PM    . Current antihyperglycemic regimen: The patient states that he does not take any medications for diabetes  .  What recent interventions/DTPs have been made to improve glycemic control: The patient states that he he stays away from carbs and watches what he eats  . Have there been any recent hospitalizations or ED visits since last visit with CPP? The patient states that he has not been to the hospital or the ED recently  . Patient denies hypoglycemic symptoms . Patient denies hyperglycemic symptoms  . How often are you checking your blood sugar? The patient states that he does check his  blood sugar in the mornings and this morning it was 103 . What are your blood sugars ranging?  o Fasting:  o Before meals: 103 o After meals:  o Bedtime:  . During the week, how often does your blood glucose drop below 70? The patient states that he had no reading under 70  . Are you checking your feet daily/regularly? The patient states that he has no sores, swelling or cuts on his feet.  Adherence Review: Is the patient currently on a STATIN medication? Yes, Atorvastatin Is the patient currently on ACE/ARB medication? Yes, Olmesartan Does the patient have >5 day gap between last estimated fill dates? No   Wendy Poet, [place] [phone]

## 2020-12-27 NOTE — Progress Notes (Signed)
_0  ID: Thomas Chung, male    DOB: 1941-08-09, 80 y.o.   MRN: [id number]  Chief Complaint  Patient presents with  . Covid Positive    +2/2, fever this morning at 100.7 takes tylenol. Has had one since testing positive. Diarrhea started 2 days ago.    Referring provider: Janith Lima, MD      HPI   Patient presents today for post COVID care clinic visit. Patient was diagnosed with COVID on 12/22/2020. Patient states that his symptoms started on 12/19/2020. Overall he is doing well. He does tai chi every morning. He is trying to continue to stay active. He complains of ongoing intermittent fevers. He does take Tylenol with good relief noted. He has had some issues with diarrhea over the past couple days as well. We discussed the importance of eating well and staying well-hydrated along with deep breathing exercises and to continue tai chi as tolerated. Denies f/c/s, n/v/d, hemoptysis, PND, chest pain or edema.      Allergies  Allergen Reactions  . Amlodipine Swelling  . Prevnar [Pneumococcal 13-Val Conj Vacc]     Joint pain, swelling    Immunization History  Administered Date(s) Administered  . Fluad Quad(high Dose 65+) 07/24/2019, 08/05/2020  . Influenza Split 08/29/2011, 08/12/2012  . Influenza Whole 08/17/2009, 08/17/2010  . Influenza, High Dose Seasonal PF 07/23/2015, 08/09/2016, 07/24/2017, 07/23/2018  . Influenza,inj,Quad PF,6+ Mos 08/07/2013, 07/29/2014  . Pneumococcal Conjugate-13 04/09/2014  . Pneumococcal Polysaccharide-23 08/17/2010, 08/06/2019  . Td 04/29/2010  . Tdap 08/05/2020  . Zoster 08/07/2013    Past Medical History:  Diagnosis Date  . Depression   . Fatty liver   . Glucose intolerance (impaired glucose tolerance)   . HOH (hard of hearing)    bilateral hearing aides  . Hx of colonic polyps   . Hyperlipidemia   . Hypertension   . Neuromuscular disorder ([place])    neuropathy feet    Tobacco History: Social History   Tobacco Use   Smoking Status Former Smoker  . Quit date: 09/21/1969  . Years since quitting: 51.3  Smokeless Tobacco Never Used   Counseling given: Not Answered   Outpatient Encounter Medications as of 12/27/2020  Medication Sig  . Accu-Chek Softclix Lancets lancets Use to check blood sugar daily. DX: E11.8  . Alcohol Swabs (B-D SINGLE USE SWABS REGULAR) PADS Inject 1 Act into the skin See admin instructions.  Marland Kitchen allopurinol (ZYLOPRIM) 100 MG tablet Take 1 tablet (100 mg total) by mouth daily.  Marland Kitchen atorvastatin (LIPITOR) 40 MG tablet TAKE 1 TABLET EVERY DAY  . B Complex-C-Folic Acid (SUPER B COMPLEX/FA/VIT C) TABS Take by mouth.  . blood glucose meter kit and supplies KIT Dispense based on patient and insurance preference. Use to check to blood sugar daily. DX: E11.9  . chlorthalidone (HYGROTON) 25 MG tablet Take 1 tablet (25 mg total) by mouth daily.  Marland Kitchen CREON [phone] units CPEP capsule TAKE 1 CAPSULE THREE TIMES DAILY WITH MEALS  . cyanocobalamin 1000 MCG tablet Take 1 tablet (1,000 mcg total) by mouth daily.  Marland Kitchen glucose blood test strip Use to check to blood sugar daily. DX: E11.9  . loperamide (IMODIUM) 2 MG capsule Take by mouth as needed for diarrhea or loose stools.  . metoprolol succinate (TOPROL-XL) 25 MG 24 hr tablet Take 1 tablet (25 mg total) by mouth daily.  Marland Kitchen MITIGARE 0.6 MG CAPS Take 1 capsule by mouth as needed.   Marland Kitchen olmesartan (BENICAR) 40 MG tablet Take 1 tablet (  40 mg total) by mouth daily.  . tadalafil (CIALIS) 5 MG tablet Take 1 tablet (5 mg total) by mouth daily as needed for erectile dysfunction.   No facility-administered encounter medications on file as of 12/27/2020.     Review of Systems  Review of Systems  Constitutional: Positive for fever. Negative for fatigue.  HENT: Negative.   Respiratory: Negative for cough and shortness of breath.   Cardiovascular: Negative.  Negative for chest pain, palpitations and leg swelling.  Gastrointestinal: Negative.    Allergic/Immunologic: Negative.   Neurological: Negative.   Psychiatric/Behavioral: Negative.        Physical Exam  BP 126/84   Pulse 86   Temp 98.6 F (37 C)   Ht _0  (1.93 m)   Wt 261 lb (118.4 kg)   SpO2 96%   BMI 31.77 kg/m   Wt Readings from Last 5 Encounters:  12/27/20 261 lb (118.4 kg)  11/04/20 266 lb (120.7 kg)  08/05/20 268 lb (121.6 kg)  02/03/20 268 lb 8 oz (121.8 kg)  08/06/19 266 lb (120.7 kg)     Physical Exam Vitals and nursing note reviewed.  Constitutional:      General: He is not in acute distress.    Appearance: He is well-developed and well-nourished.  Cardiovascular:     Rate and Rhythm: Normal rate and regular rhythm.  Pulmonary:     Effort: Pulmonary effort is normal.     Breath sounds: Normal breath sounds.  Musculoskeletal:     Right lower leg: No edema.     Left lower leg: No edema.  Skin:    General: Skin is warm and dry.  Neurological:     Mental Status: He is alert and oriented to person, place, and time.  Psychiatric:        Mood and Affect: Mood and affect and mood normal.        Behavior: Behavior normal.       Assessment & Plan:   COVID-19 Stay well hydrated  Stay active  Deep breathing exercises  May take tylenol or fever or pain  May take mucinex twice daily     Follow up:  Follow up in 1 week or sooner if needed     Fenton Foy, NP 12/27/2020

## 2020-12-27 NOTE — Patient Instructions (Signed)
Covid 19:   Stay well hydrated  Stay active  Deep breathing exercises  May take tylenol or fever or pain  May take mucinex twice daily     Follow up:  Follow up in 1 week or sooner if needed

## 2020-12-29 ENCOUNTER — Other Ambulatory Visit: Payer: Self-pay | Admitting: Nurse Practitioner

## 2020-12-29 ENCOUNTER — Ambulatory Visit (INDEPENDENT_AMBULATORY_CARE_PROVIDER_SITE_OTHER): Payer: Medicare HMO | Admitting: Nurse Practitioner

## 2020-12-29 ENCOUNTER — Other Ambulatory Visit: Payer: Self-pay

## 2020-12-29 ENCOUNTER — Telehealth: Payer: Self-pay | Admitting: Nurse Practitioner

## 2020-12-29 ENCOUNTER — Ambulatory Visit (HOSPITAL_COMMUNITY)
Admission: RE | Admit: 2020-12-29 | Discharge: 2020-12-29 | Disposition: A | Discharge status: Home / Self Care | Payer: Medicare HMO | Source: Ambulatory Visit | Attending: Nurse Practitioner | Admitting: Nurse Practitioner

## 2020-12-29 DIAGNOSIS — I10 Essential (primary) hypertension: Secondary | ICD-10-CM | POA: Diagnosis not present

## 2020-12-29 DIAGNOSIS — R197 Diarrhea, unspecified: Secondary | ICD-10-CM | POA: Insufficient documentation

## 2020-12-29 DIAGNOSIS — K55021 Focal (segmental) acute infarction of small intestine: Secondary | ICD-10-CM | POA: Diagnosis not present

## 2020-12-29 DIAGNOSIS — G9389 Other specified disorders of brain: Secondary | ICD-10-CM | POA: Diagnosis not present

## 2020-12-29 DIAGNOSIS — K529 Noninfective gastroenteritis and colitis, unspecified: Secondary | ICD-10-CM | POA: Diagnosis not present

## 2020-12-29 DIAGNOSIS — J189 Pneumonia, unspecified organism: Secondary | ICD-10-CM | POA: Diagnosis not present

## 2020-12-29 DIAGNOSIS — R918 Other nonspecific abnormal finding of lung field: Secondary | ICD-10-CM | POA: Diagnosis not present

## 2020-12-29 DIAGNOSIS — E118 Type 2 diabetes mellitus with unspecified complications: Secondary | ICD-10-CM | POA: Diagnosis not present

## 2020-12-29 DIAGNOSIS — Z8709 Personal history of other diseases of the respiratory system: Secondary | ICD-10-CM | POA: Diagnosis not present

## 2020-12-29 DIAGNOSIS — K559 Vascular disorder of intestine, unspecified: Secondary | ICD-10-CM | POA: Diagnosis not present

## 2020-12-29 DIAGNOSIS — K6389 Other specified diseases of intestine: Secondary | ICD-10-CM | POA: Diagnosis not present

## 2020-12-29 DIAGNOSIS — N281 Cyst of kidney, acquired: Secondary | ICD-10-CM | POA: Diagnosis not present

## 2020-12-29 DIAGNOSIS — K5180 Other ulcerative colitis without complications: Secondary | ICD-10-CM | POA: Diagnosis not present

## 2020-12-29 DIAGNOSIS — K56609 Unspecified intestinal obstruction, unspecified as to partial versus complete obstruction: Secondary | ICD-10-CM | POA: Diagnosis not present

## 2020-12-29 DIAGNOSIS — J9601 Acute respiratory failure with hypoxia: Secondary | ICD-10-CM | POA: Diagnosis not present

## 2020-12-29 DIAGNOSIS — E119 Type 2 diabetes mellitus without complications: Secondary | ICD-10-CM | POA: Diagnosis not present

## 2020-12-29 DIAGNOSIS — Z7189 Other specified counseling: Secondary | ICD-10-CM | POA: Diagnosis not present

## 2020-12-29 DIAGNOSIS — A419 Sepsis, unspecified organism: Secondary | ICD-10-CM | POA: Diagnosis not present

## 2020-12-29 DIAGNOSIS — I5040 Unspecified combined systolic (congestive) and diastolic (congestive) heart failure: Secondary | ICD-10-CM | POA: Diagnosis not present

## 2020-12-29 DIAGNOSIS — N179 Acute kidney failure, unspecified: Secondary | ICD-10-CM | POA: Diagnosis not present

## 2020-12-29 DIAGNOSIS — R569 Unspecified convulsions: Secondary | ICD-10-CM | POA: Diagnosis not present

## 2020-12-29 DIAGNOSIS — R6520 Severe sepsis without septic shock: Secondary | ICD-10-CM | POA: Diagnosis not present

## 2020-12-29 DIAGNOSIS — K219 Gastro-esophageal reflux disease without esophagitis: Secondary | ICD-10-CM | POA: Diagnosis not present

## 2020-12-29 DIAGNOSIS — Z1611 Resistance to penicillins: Secondary | ICD-10-CM | POA: Diagnosis present

## 2020-12-29 DIAGNOSIS — K760 Fatty (change of) liver, not elsewhere classified: Secondary | ICD-10-CM | POA: Diagnosis present

## 2020-12-29 DIAGNOSIS — R0603 Acute respiratory distress: Secondary | ICD-10-CM | POA: Diagnosis not present

## 2020-12-29 DIAGNOSIS — E870 Hyperosmolality and hypernatremia: Secondary | ICD-10-CM | POA: Diagnosis not present

## 2020-12-29 DIAGNOSIS — Z66 Do not resuscitate: Secondary | ICD-10-CM | POA: Diagnosis not present

## 2020-12-29 DIAGNOSIS — Z87891 Personal history of nicotine dependence: Secondary | ICD-10-CM | POA: Diagnosis not present

## 2020-12-29 DIAGNOSIS — J9690 Respiratory failure, unspecified, unspecified whether with hypoxia or hypercapnia: Secondary | ICD-10-CM | POA: Diagnosis not present

## 2020-12-29 DIAGNOSIS — Z887 Allergy status to serum and vaccine status: Secondary | ICD-10-CM | POA: Diagnosis not present

## 2020-12-29 DIAGNOSIS — J80 Acute respiratory distress syndrome: Secondary | ICD-10-CM | POA: Diagnosis not present

## 2020-12-29 DIAGNOSIS — U071 COVID-19: Secondary | ICD-10-CM | POA: Insufficient documentation

## 2020-12-29 DIAGNOSIS — Z4682 Encounter for fitting and adjustment of non-vascular catheter: Secondary | ICD-10-CM | POA: Diagnosis not present

## 2020-12-29 DIAGNOSIS — Z515 Encounter for palliative care: Secondary | ICD-10-CM | POA: Diagnosis not present

## 2020-12-29 DIAGNOSIS — D72829 Elevated white blood cell count, unspecified: Secondary | ICD-10-CM | POA: Diagnosis not present

## 2020-12-29 DIAGNOSIS — R0902 Hypoxemia: Secondary | ICD-10-CM | POA: Diagnosis present

## 2020-12-29 DIAGNOSIS — Z888 Allergy status to other drugs, medicaments and biological substances status: Secondary | ICD-10-CM | POA: Diagnosis not present

## 2020-12-29 DIAGNOSIS — R0989 Other specified symptoms and signs involving the circulatory and respiratory systems: Secondary | ICD-10-CM | POA: Diagnosis not present

## 2020-12-29 DIAGNOSIS — Z8616 Personal history of COVID-19: Secondary | ICD-10-CM | POA: Diagnosis not present

## 2020-12-29 DIAGNOSIS — S2231XA Fracture of one rib, right side, initial encounter for closed fracture: Secondary | ICD-10-CM | POA: Diagnosis not present

## 2020-12-29 DIAGNOSIS — J1282 Pneumonia due to coronavirus disease 2019: Secondary | ICD-10-CM | POA: Diagnosis not present

## 2020-12-29 DIAGNOSIS — G928 Other toxic encephalopathy: Secondary | ICD-10-CM | POA: Diagnosis not present

## 2020-12-29 DIAGNOSIS — R209 Unspecified disturbances of skin sensation: Secondary | ICD-10-CM | POA: Diagnosis not present

## 2020-12-29 DIAGNOSIS — K55029 Acute infarction of small intestine, extent unspecified: Secondary | ICD-10-CM | POA: Diagnosis not present

## 2020-12-29 DIAGNOSIS — Y95 Nosocomial condition: Secondary | ICD-10-CM | POA: Diagnosis not present

## 2020-12-29 DIAGNOSIS — H748X3 Other specified disorders of middle ear and mastoid, bilateral: Secondary | ICD-10-CM | POA: Diagnosis not present

## 2020-12-29 DIAGNOSIS — E785 Hyperlipidemia, unspecified: Secondary | ICD-10-CM | POA: Diagnosis not present

## 2020-12-29 DIAGNOSIS — D649 Anemia, unspecified: Secondary | ICD-10-CM | POA: Diagnosis not present

## 2020-12-29 DIAGNOSIS — K8681 Exocrine pancreatic insufficiency: Secondary | ICD-10-CM | POA: Diagnosis not present

## 2020-12-29 DIAGNOSIS — I998 Other disorder of circulatory system: Secondary | ICD-10-CM | POA: Diagnosis not present

## 2020-12-29 DIAGNOSIS — Z79899 Other long term (current) drug therapy: Secondary | ICD-10-CM | POA: Diagnosis not present

## 2020-12-29 DIAGNOSIS — K658 Other peritonitis: Secondary | ICD-10-CM | POA: Diagnosis not present

## 2020-12-29 DIAGNOSIS — E1142 Type 2 diabetes mellitus with diabetic polyneuropathy: Secondary | ICD-10-CM | POA: Diagnosis present

## 2020-12-29 DIAGNOSIS — K659 Peritonitis, unspecified: Secondary | ICD-10-CM | POA: Diagnosis not present

## 2020-12-29 DIAGNOSIS — K55059 Acute (reversible) ischemia of intestine, part and extent unspecified: Secondary | ICD-10-CM | POA: Diagnosis not present

## 2020-12-29 DIAGNOSIS — J069 Acute upper respiratory infection, unspecified: Secondary | ICD-10-CM | POA: Diagnosis not present

## 2020-12-29 DIAGNOSIS — S31109A Unspecified open wound of abdominal wall, unspecified quadrant without penetration into peritoneal cavity, initial encounter: Secondary | ICD-10-CM | POA: Diagnosis not present

## 2020-12-29 DIAGNOSIS — A414 Sepsis due to anaerobes: Secondary | ICD-10-CM | POA: Diagnosis not present

## 2020-12-29 DIAGNOSIS — Y92239 Unspecified place in hospital as the place of occurrence of the external cause: Secondary | ICD-10-CM | POA: Diagnosis not present

## 2020-12-29 DIAGNOSIS — Z85828 Personal history of other malignant neoplasm of skin: Secondary | ICD-10-CM | POA: Diagnosis not present

## 2020-12-29 DIAGNOSIS — E872 Acidosis: Secondary | ICD-10-CM | POA: Diagnosis not present

## 2020-12-29 DIAGNOSIS — R059 Cough, unspecified: Secondary | ICD-10-CM | POA: Diagnosis not present

## 2020-12-29 DIAGNOSIS — R7881 Bacteremia: Secondary | ICD-10-CM | POA: Diagnosis not present

## 2020-12-29 DIAGNOSIS — R0602 Shortness of breath: Secondary | ICD-10-CM | POA: Diagnosis not present

## 2020-12-29 DIAGNOSIS — A4189 Other specified sepsis: Secondary | ICD-10-CM | POA: Diagnosis not present

## 2020-12-29 DIAGNOSIS — R140 Abdominal distension (gaseous): Secondary | ICD-10-CM | POA: Diagnosis not present

## 2020-12-29 DIAGNOSIS — J150 Pneumonia due to Klebsiella pneumoniae: Secondary | ICD-10-CM | POA: Diagnosis not present

## 2020-12-29 DIAGNOSIS — J984 Other disorders of lung: Secondary | ICD-10-CM | POA: Diagnosis not present

## 2020-12-29 DIAGNOSIS — K567 Ileus, unspecified: Secondary | ICD-10-CM | POA: Diagnosis not present

## 2020-12-29 DIAGNOSIS — N170 Acute kidney failure with tubular necrosis: Secondary | ICD-10-CM | POA: Diagnosis not present

## 2020-12-29 NOTE — Progress Notes (Signed)
Patient in only for Lab Work.

## 2020-12-29 NOTE — Addendum Note (Signed)
Addended by: Thressa Sheller on: 12/29/2020 11:17 AM   Modules accepted: Orders

## 2020-12-29 NOTE — Addendum Note (Signed)
Addended by: Thressa Sheller on: 12/29/2020 11:13 AM   Modules accepted: Orders

## 2020-12-29 NOTE — Telephone Encounter (Signed)
Pt called to report continued diarrhea since Day 2 COVID positive. Also fever that continues to go up and down 3 times per day. Pt concerned of underlying infection or problem.  Pt request lab work to check for any other cause for prolonged symptoms.  Pt appt Monday 01/03/21 but would like to go today for labs to get answers. Call pt [phone].

## 2020-12-30 LAB — COMPREHENSIVE METABOLIC PANEL
ALT: 26 IU/L (ref 0–44)
AST: 37 IU/L (ref 0–40)
Albumin/Globulin Ratio: 2 (ref 1.2–2.2)
Albumin: 4.3 g/dL (ref 3.7–4.7)
Alkaline Phosphatase: 75 IU/L (ref 44–121)
BUN/Creatinine Ratio: 23 (ref 10–24)
BUN: 36 mg/dL — ABNORMAL HIGH (ref 8–27)
Bilirubin Total: 0.5 mg/dL (ref 0.0–1.2)
CO2: 17 mmol/L — ABNORMAL LOW (ref 20–29)
Calcium: 9 mg/dL (ref 8.6–10.2)
Chloride: 95 mmol/L — ABNORMAL LOW (ref 96–106)
Creatinine, Ser: 1.57 mg/dL — ABNORMAL HIGH (ref 0.76–1.27)
GFR calc Af Amer: 48 mL/min/{1.73_m2} — ABNORMAL LOW (ref >59)
GFR calc non Af Amer: 41 mL/min/{1.73_m2} — ABNORMAL LOW (ref >59)
Globulin, Total: 2.2 g/dL (ref 1.5–4.5)
Glucose: 97 mg/dL (ref 65–99)
Potassium: 4.5 mmol/L (ref 3.5–5.2)
Sodium: 133 mmol/L — ABNORMAL LOW (ref 134–144)
Total Protein: 6.5 g/dL (ref 6.0–8.5)

## 2020-12-30 LAB — CBC
Hematocrit: 44.9 % (ref 37.5–51.0)
Hemoglobin: 14.8 g/dL (ref 13.0–17.7)
MCH: 25.8 pg — ABNORMAL LOW (ref 26.6–33.0)
MCHC: 33 g/dL (ref 31.5–35.7)
MCV: 78 fL — ABNORMAL LOW (ref 79–97)
Platelets: 353 10*3/uL (ref 150–450)
RBC: 5.74 x10E6/uL (ref 4.14–5.80)
RDW: 15.8 % — ABNORMAL HIGH (ref 11.6–15.4)
WBC: 6.6 10*3/uL (ref 3.4–10.8)

## 2021-01-01 ENCOUNTER — Inpatient Hospital Stay (HOSPITAL_COMMUNITY)
Admission: EM | Admit: 2021-01-01 | Discharge: 2021-01-18 | DRG: 981 | Disposition: E | Payer: Medicare HMO | Attending: Pulmonary Disease | Admitting: Pulmonary Disease

## 2021-01-01 ENCOUNTER — Other Ambulatory Visit: Payer: Self-pay

## 2021-01-01 ENCOUNTER — Encounter (HOSPITAL_COMMUNITY): Payer: Self-pay

## 2021-01-01 ENCOUNTER — Emergency Department (HOSPITAL_COMMUNITY): Payer: Medicare HMO

## 2021-01-01 DIAGNOSIS — A419 Sepsis, unspecified organism: Secondary | ICD-10-CM

## 2021-01-01 DIAGNOSIS — J9600 Acute respiratory failure, unspecified whether with hypoxia or hypercapnia: Secondary | ICD-10-CM

## 2021-01-01 DIAGNOSIS — J150 Pneumonia due to Klebsiella pneumoniae: Secondary | ICD-10-CM | POA: Diagnosis not present

## 2021-01-01 DIAGNOSIS — R682 Dry mouth, unspecified: Secondary | ICD-10-CM | POA: Diagnosis not present

## 2021-01-01 DIAGNOSIS — R918 Other nonspecific abnormal finding of lung field: Secondary | ICD-10-CM | POA: Diagnosis not present

## 2021-01-01 DIAGNOSIS — Y95 Nosocomial condition: Secondary | ICD-10-CM | POA: Diagnosis not present

## 2021-01-01 DIAGNOSIS — R7881 Bacteremia: Secondary | ICD-10-CM

## 2021-01-01 DIAGNOSIS — T380X5A Adverse effect of glucocorticoids and synthetic analogues, initial encounter: Secondary | ICD-10-CM | POA: Diagnosis not present

## 2021-01-01 DIAGNOSIS — Z887 Allergy status to serum and vaccine status: Secondary | ICD-10-CM | POA: Diagnosis not present

## 2021-01-01 DIAGNOSIS — E119 Type 2 diabetes mellitus without complications: Secondary | ICD-10-CM | POA: Diagnosis not present

## 2021-01-01 DIAGNOSIS — E1122 Type 2 diabetes mellitus with diabetic chronic kidney disease: Secondary | ICD-10-CM | POA: Diagnosis present

## 2021-01-01 DIAGNOSIS — R0603 Acute respiratory distress: Secondary | ICD-10-CM | POA: Diagnosis not present

## 2021-01-01 DIAGNOSIS — Z4682 Encounter for fitting and adjustment of non-vascular catheter: Secondary | ICD-10-CM | POA: Diagnosis not present

## 2021-01-01 DIAGNOSIS — K559 Vascular disorder of intestine, unspecified: Secondary | ICD-10-CM | POA: Diagnosis not present

## 2021-01-01 DIAGNOSIS — M109 Gout, unspecified: Secondary | ICD-10-CM | POA: Diagnosis present

## 2021-01-01 DIAGNOSIS — A414 Sepsis due to anaerobes: Secondary | ICD-10-CM | POA: Diagnosis not present

## 2021-01-01 DIAGNOSIS — E118 Type 2 diabetes mellitus with unspecified complications: Secondary | ICD-10-CM | POA: Diagnosis present

## 2021-01-01 DIAGNOSIS — R209 Unspecified disturbances of skin sensation: Secondary | ICD-10-CM | POA: Diagnosis not present

## 2021-01-01 DIAGNOSIS — R6520 Severe sepsis without septic shock: Secondary | ICD-10-CM | POA: Diagnosis not present

## 2021-01-01 DIAGNOSIS — Z85828 Personal history of other malignant neoplasm of skin: Secondary | ICD-10-CM

## 2021-01-01 DIAGNOSIS — I10 Essential (primary) hypertension: Secondary | ICD-10-CM | POA: Diagnosis not present

## 2021-01-01 DIAGNOSIS — J1282 Pneumonia due to coronavirus disease 2019: Secondary | ICD-10-CM | POA: Diagnosis not present

## 2021-01-01 DIAGNOSIS — Z515 Encounter for palliative care: Secondary | ICD-10-CM | POA: Diagnosis not present

## 2021-01-01 DIAGNOSIS — H748X3 Other specified disorders of middle ear and mastoid, bilateral: Secondary | ICD-10-CM | POA: Diagnosis not present

## 2021-01-01 DIAGNOSIS — K529 Noninfective gastroenteritis and colitis, unspecified: Secondary | ICD-10-CM | POA: Diagnosis not present

## 2021-01-01 DIAGNOSIS — N179 Acute kidney failure, unspecified: Secondary | ICD-10-CM

## 2021-01-01 DIAGNOSIS — E872 Acidosis: Secondary | ICD-10-CM | POA: Diagnosis not present

## 2021-01-01 DIAGNOSIS — K8689 Other specified diseases of pancreas: Secondary | ICD-10-CM | POA: Diagnosis present

## 2021-01-01 DIAGNOSIS — J984 Other disorders of lung: Secondary | ICD-10-CM | POA: Diagnosis not present

## 2021-01-01 DIAGNOSIS — Z79899 Other long term (current) drug therapy: Secondary | ICD-10-CM

## 2021-01-01 DIAGNOSIS — E1165 Type 2 diabetes mellitus with hyperglycemia: Secondary | ICD-10-CM | POA: Diagnosis not present

## 2021-01-01 DIAGNOSIS — Z66 Do not resuscitate: Secondary | ICD-10-CM | POA: Diagnosis not present

## 2021-01-01 DIAGNOSIS — Z1611 Resistance to penicillins: Secondary | ICD-10-CM | POA: Diagnosis present

## 2021-01-01 DIAGNOSIS — K8681 Exocrine pancreatic insufficiency: Secondary | ICD-10-CM | POA: Diagnosis not present

## 2021-01-01 DIAGNOSIS — R140 Abdominal distension (gaseous): Secondary | ICD-10-CM | POA: Diagnosis not present

## 2021-01-01 DIAGNOSIS — R0902 Hypoxemia: Secondary | ICD-10-CM | POA: Diagnosis not present

## 2021-01-01 DIAGNOSIS — R748 Abnormal levels of other serum enzymes: Secondary | ICD-10-CM | POA: Diagnosis not present

## 2021-01-01 DIAGNOSIS — K5289 Other specified noninfective gastroenteritis and colitis: Secondary | ICD-10-CM | POA: Diagnosis present

## 2021-01-01 DIAGNOSIS — R569 Unspecified convulsions: Secondary | ICD-10-CM | POA: Diagnosis not present

## 2021-01-01 DIAGNOSIS — K567 Ileus, unspecified: Secondary | ICD-10-CM | POA: Diagnosis not present

## 2021-01-01 DIAGNOSIS — E1142 Type 2 diabetes mellitus with diabetic polyneuropathy: Secondary | ICD-10-CM | POA: Diagnosis present

## 2021-01-01 DIAGNOSIS — E669 Obesity, unspecified: Secondary | ICD-10-CM | POA: Diagnosis present

## 2021-01-01 DIAGNOSIS — I4891 Unspecified atrial fibrillation: Secondary | ICD-10-CM | POA: Diagnosis not present

## 2021-01-01 DIAGNOSIS — K659 Peritonitis, unspecified: Secondary | ICD-10-CM | POA: Diagnosis not present

## 2021-01-01 DIAGNOSIS — K56609 Unspecified intestinal obstruction, unspecified as to partial versus complete obstruction: Secondary | ICD-10-CM | POA: Diagnosis not present

## 2021-01-01 DIAGNOSIS — Z87891 Personal history of nicotine dependence: Secondary | ICD-10-CM | POA: Diagnosis not present

## 2021-01-01 DIAGNOSIS — E870 Hyperosmolality and hypernatremia: Secondary | ICD-10-CM | POA: Diagnosis not present

## 2021-01-01 DIAGNOSIS — D509 Iron deficiency anemia, unspecified: Secondary | ICD-10-CM | POA: Diagnosis present

## 2021-01-01 DIAGNOSIS — U071 COVID-19: Secondary | ICD-10-CM | POA: Diagnosis not present

## 2021-01-01 DIAGNOSIS — K760 Fatty (change of) liver, not elsewhere classified: Secondary | ICD-10-CM | POA: Diagnosis present

## 2021-01-01 DIAGNOSIS — R0989 Other specified symptoms and signs involving the circulatory and respiratory systems: Secondary | ICD-10-CM | POA: Diagnosis not present

## 2021-01-01 DIAGNOSIS — G928 Other toxic encephalopathy: Secondary | ICD-10-CM | POA: Diagnosis not present

## 2021-01-01 DIAGNOSIS — N170 Acute kidney failure with tubular necrosis: Secondary | ICD-10-CM | POA: Diagnosis not present

## 2021-01-01 DIAGNOSIS — I129 Hypertensive chronic kidney disease with stage 1 through stage 4 chronic kidney disease, or unspecified chronic kidney disease: Secondary | ICD-10-CM | POA: Diagnosis present

## 2021-01-01 DIAGNOSIS — J80 Acute respiratory distress syndrome: Secondary | ICD-10-CM | POA: Diagnosis present

## 2021-01-01 DIAGNOSIS — R251 Tremor, unspecified: Secondary | ICD-10-CM | POA: Diagnosis not present

## 2021-01-01 DIAGNOSIS — Z4659 Encounter for fitting and adjustment of other gastrointestinal appliance and device: Secondary | ICD-10-CM

## 2021-01-01 DIAGNOSIS — Z452 Encounter for adjustment and management of vascular access device: Secondary | ICD-10-CM

## 2021-01-01 DIAGNOSIS — K658 Other peritonitis: Secondary | ICD-10-CM | POA: Diagnosis not present

## 2021-01-01 DIAGNOSIS — Z8616 Personal history of COVID-19: Secondary | ICD-10-CM | POA: Diagnosis not present

## 2021-01-01 DIAGNOSIS — R7982 Elevated C-reactive protein (CRP): Secondary | ICD-10-CM | POA: Diagnosis present

## 2021-01-01 DIAGNOSIS — Z01818 Encounter for other preprocedural examination: Secondary | ICD-10-CM

## 2021-01-01 DIAGNOSIS — E878 Other disorders of electrolyte and fluid balance, not elsewhere classified: Secondary | ICD-10-CM | POA: Diagnosis not present

## 2021-01-01 DIAGNOSIS — D72829 Elevated white blood cell count, unspecified: Secondary | ICD-10-CM | POA: Diagnosis not present

## 2021-01-01 DIAGNOSIS — K55021 Focal (segmental) acute infarction of small intestine: Secondary | ICD-10-CM | POA: Diagnosis not present

## 2021-01-01 DIAGNOSIS — A4189 Other specified sepsis: Secondary | ICD-10-CM | POA: Diagnosis not present

## 2021-01-01 DIAGNOSIS — G9389 Other specified disorders of brain: Secondary | ICD-10-CM | POA: Diagnosis not present

## 2021-01-01 DIAGNOSIS — F32A Depression, unspecified: Secondary | ICD-10-CM | POA: Diagnosis present

## 2021-01-01 DIAGNOSIS — R197 Diarrhea, unspecified: Secondary | ICD-10-CM | POA: Diagnosis present

## 2021-01-01 DIAGNOSIS — Z86010 Personal history of colonic polyps: Secondary | ICD-10-CM

## 2021-01-01 DIAGNOSIS — K55059 Acute (reversible) ischemia of intestine, part and extent unspecified: Secondary | ICD-10-CM | POA: Diagnosis not present

## 2021-01-01 DIAGNOSIS — I5040 Unspecified combined systolic (congestive) and diastolic (congestive) heart failure: Secondary | ICD-10-CM | POA: Diagnosis not present

## 2021-01-01 DIAGNOSIS — E875 Hyperkalemia: Secondary | ICD-10-CM | POA: Diagnosis not present

## 2021-01-01 DIAGNOSIS — R0602 Shortness of breath: Secondary | ICD-10-CM | POA: Diagnosis not present

## 2021-01-01 DIAGNOSIS — Z888 Allergy status to other drugs, medicaments and biological substances status: Secondary | ICD-10-CM

## 2021-01-01 DIAGNOSIS — Y92239 Unspecified place in hospital as the place of occurrence of the external cause: Secondary | ICD-10-CM | POA: Diagnosis not present

## 2021-01-01 DIAGNOSIS — R339 Retention of urine, unspecified: Secondary | ICD-10-CM | POA: Diagnosis present

## 2021-01-01 DIAGNOSIS — N281 Cyst of kidney, acquired: Secondary | ICD-10-CM | POA: Diagnosis not present

## 2021-01-01 DIAGNOSIS — R7989 Other specified abnormal findings of blood chemistry: Secondary | ICD-10-CM

## 2021-01-01 DIAGNOSIS — I998 Other disorder of circulatory system: Secondary | ICD-10-CM | POA: Diagnosis not present

## 2021-01-01 DIAGNOSIS — N1831 Chronic kidney disease, stage 3a: Secondary | ICD-10-CM | POA: Diagnosis present

## 2021-01-01 DIAGNOSIS — D649 Anemia, unspecified: Secondary | ICD-10-CM | POA: Diagnosis not present

## 2021-01-01 DIAGNOSIS — K5180 Other ulcerative colitis without complications: Secondary | ICD-10-CM | POA: Diagnosis not present

## 2021-01-01 DIAGNOSIS — K219 Gastro-esophageal reflux disease without esophagitis: Secondary | ICD-10-CM | POA: Diagnosis not present

## 2021-01-01 DIAGNOSIS — Z8709 Personal history of other diseases of the respiratory system: Secondary | ICD-10-CM | POA: Diagnosis not present

## 2021-01-01 DIAGNOSIS — E785 Hyperlipidemia, unspecified: Secondary | ICD-10-CM | POA: Diagnosis not present

## 2021-01-01 DIAGNOSIS — J9690 Respiratory failure, unspecified, unspecified whether with hypoxia or hypercapnia: Secondary | ICD-10-CM | POA: Diagnosis not present

## 2021-01-01 DIAGNOSIS — E781 Pure hyperglyceridemia: Secondary | ICD-10-CM | POA: Diagnosis not present

## 2021-01-01 DIAGNOSIS — J069 Acute upper respiratory infection, unspecified: Secondary | ICD-10-CM | POA: Diagnosis not present

## 2021-01-01 DIAGNOSIS — J189 Pneumonia, unspecified organism: Secondary | ICD-10-CM | POA: Diagnosis not present

## 2021-01-01 DIAGNOSIS — R1110 Vomiting, unspecified: Secondary | ICD-10-CM | POA: Diagnosis not present

## 2021-01-01 DIAGNOSIS — K55029 Acute infarction of small intestine, extent unspecified: Secondary | ICD-10-CM | POA: Diagnosis not present

## 2021-01-01 DIAGNOSIS — J9601 Acute respiratory failure with hypoxia: Secondary | ICD-10-CM | POA: Diagnosis not present

## 2021-01-01 DIAGNOSIS — Z7189 Other specified counseling: Secondary | ICD-10-CM | POA: Diagnosis not present

## 2021-01-01 DIAGNOSIS — K6389 Other specified diseases of intestine: Secondary | ICD-10-CM | POA: Diagnosis not present

## 2021-01-01 DIAGNOSIS — Z6831 Body mass index (BMI) 31.0-31.9, adult: Secondary | ICD-10-CM

## 2021-01-01 DIAGNOSIS — H9190 Unspecified hearing loss, unspecified ear: Secondary | ICD-10-CM | POA: Diagnosis present

## 2021-01-01 DIAGNOSIS — R069 Unspecified abnormalities of breathing: Secondary | ICD-10-CM

## 2021-01-01 LAB — CBC WITH DIFFERENTIAL/PLATELET
Abs Immature Granulocytes: 0.04 10*3/uL (ref 0.00–0.07)
Basophils Absolute: 0 10*3/uL (ref 0.0–0.1)
Basophils Relative: 0 %
Eosinophils Absolute: 0 10*3/uL (ref 0.0–0.5)
Eosinophils Relative: 0 %
HCT: 43.9 % (ref 39.0–52.0)
Hemoglobin: 14.7 g/dL (ref 13.0–17.0)
Immature Granulocytes: 1 %
Lymphocytes Relative: 5 %
Lymphs Abs: 0.4 10*3/uL — ABNORMAL LOW (ref 0.7–4.0)
MCH: 26 pg (ref 26.0–34.0)
MCHC: 33.5 g/dL (ref 30.0–36.0)
MCV: 77.6 fL — ABNORMAL LOW (ref 80.0–100.0)
Monocytes Absolute: 0.7 10*3/uL (ref 0.1–1.0)
Monocytes Relative: 9 %
Neutro Abs: 7.1 10*3/uL (ref 1.7–7.7)
Neutrophils Relative %: 85 %
Platelets: 326 10*3/uL (ref 150–400)
RBC: 5.66 MIL/uL (ref 4.22–5.81)
RDW: 14.8 % (ref 11.5–15.5)
WBC: 8.3 10*3/uL (ref 4.0–10.5)
nRBC: 0 % (ref 0.0–0.2)

## 2021-01-01 LAB — COMPREHENSIVE METABOLIC PANEL
ALT: 34 U/L (ref 0–44)
AST: 52 U/L — ABNORMAL HIGH (ref 15–41)
Albumin: 3.8 g/dL (ref 3.5–5.0)
Alkaline Phosphatase: 55 U/L (ref 38–126)
Anion gap: 13 (ref 5–15)
BUN: 38 mg/dL — ABNORMAL HIGH (ref 8–23)
CO2: 20 mmol/L — ABNORMAL LOW (ref 22–32)
Calcium: 8.5 mg/dL — ABNORMAL LOW (ref 8.9–10.3)
Chloride: 98 mmol/L (ref 98–111)
Creatinine, Ser: 1.27 mg/dL — ABNORMAL HIGH (ref 0.61–1.24)
GFR, Estimated: 57 mL/min — ABNORMAL LOW (ref >60)
Glucose, Bld: 152 mg/dL — ABNORMAL HIGH (ref 70–99)
Potassium: 3.5 mmol/L (ref 3.5–5.1)
Sodium: 131 mmol/L — ABNORMAL LOW (ref 135–145)
Total Bilirubin: 1 mg/dL (ref 0.3–1.2)
Total Protein: 6.8 g/dL (ref 6.5–8.1)

## 2021-01-01 LAB — RESP PANEL BY RT-PCR (FLU A&B, COVID) ARPGX2
Influenza A by PCR: NEGATIVE
Influenza B by PCR: NEGATIVE
SARS Coronavirus 2 by RT PCR: POSITIVE — AB

## 2021-01-01 LAB — PROCALCITONIN: Procalcitonin: <0.1 ng/mL

## 2021-01-01 LAB — LACTATE DEHYDROGENASE: LDH: 289 U/L — ABNORMAL HIGH (ref 98–192)

## 2021-01-01 LAB — FIBRINOGEN: Fibrinogen: 507 mg/dL — ABNORMAL HIGH (ref 210–475)

## 2021-01-01 LAB — LACTIC ACID, PLASMA: Lactic Acid, Venous: 1.4 mmol/L (ref 0.5–1.9)

## 2021-01-01 LAB — TRIGLYCERIDES: Triglycerides: 65 mg/dL (ref <150)

## 2021-01-01 LAB — C-REACTIVE PROTEIN: CRP: 5 mg/dL — ABNORMAL HIGH (ref <1.0)

## 2021-01-01 LAB — FERRITIN: Ferritin: 221 ng/mL (ref 24–336)

## 2021-01-01 LAB — D-DIMER, QUANTITATIVE: D-Dimer, Quant: 0.63 ug/mL-FEU — ABNORMAL HIGH (ref 0.00–0.50)

## 2021-01-01 MED ORDER — BENZONATATE 100 MG PO CAPS
200.0000 mg | ORAL_CAPSULE | Freq: Three times a day (TID) | ORAL | Status: DC | PRN
  Administered 2021-01-02 – 2021-01-03 (×2): 200 mg via ORAL
  Filled 2021-01-01 (×2): qty 2

## 2021-01-01 MED ORDER — PREDNISONE 50 MG PO TABS: 50.0000 mg | ORAL_TABLET | Freq: Every day | ORAL | Status: DC

## 2021-01-01 MED ORDER — DEXAMETHASONE SODIUM PHOSPHATE 10 MG/ML IJ SOLN
10.0000 mg | Freq: Once | INTRAMUSCULAR | Status: AC
  Administered 2021-01-01: 10 mg via INTRAVENOUS
  Filled 2021-01-01: qty 1

## 2021-01-01 MED ORDER — SODIUM CHLORIDE 0.9 % IV BOLUS
500.0000 mL | Freq: Once | INTRAVENOUS | Status: AC
  Administered 2021-01-01: 500 mL via INTRAVENOUS

## 2021-01-01 MED ORDER — ONDANSETRON HCL 4 MG/2ML IJ SOLN
4.0000 mg | Freq: Once | INTRAMUSCULAR | Status: DC
  Filled 2021-01-01: qty 2

## 2021-01-01 MED ORDER — ENOXAPARIN SODIUM 60 MG/0.6ML ~~LOC~~ SOLN
60.0000 mg | SUBCUTANEOUS | Status: DC
  Administered 2021-01-01 – 2021-01-07 (×7): 60 mg via SUBCUTANEOUS
  Filled 2021-01-01 (×8): qty 0.6

## 2021-01-01 MED ORDER — SODIUM CHLORIDE 0.9 % IV SOLN
100.0000 mg | Freq: Every day | INTRAVENOUS | Status: AC
  Administered 2021-01-02 – 2021-01-05 (×4): 100 mg via INTRAVENOUS
  Filled 2021-01-01 (×4): qty 20

## 2021-01-01 MED ORDER — METHYLPREDNISOLONE SODIUM SUCC 125 MG IJ SOLR: 62.5000 mg | Freq: Two times a day (BID) | INTRAMUSCULAR | Status: DC

## 2021-01-01 MED ORDER — SODIUM CHLORIDE 0.9 % IV SOLN
200.0000 mg | Freq: Once | INTRAVENOUS | Status: AC
  Administered 2021-01-01: 200 mg via INTRAVENOUS
  Filled 2021-01-01: qty 40

## 2021-01-01 MED ORDER — SODIUM CHLORIDE 0.9 % IV SOLN: INTRAVENOUS | Status: AC

## 2021-01-01 NOTE — ED Provider Notes (Signed)
[facility] DEPT Provider Note   CSN: [id number] Arrival date & time: 12/21/2020  1614     History Chief Complaint  Patient presents with  . Shortness of Breath  . Weakness  . Diarrhea    Thomas Chung is a 80 y.o. male.  HPI Patient is a 80 year old male with history of hypertension, hyperlipidemia, who presents the emergency department due to fatigue.  Patient tested positive for COVID-19 on January 31.  States he was initially experiencing URI symptoms as well as diarrhea.  His URI symptoms have mostly resolved.  Now reports significant fatigue, continued diarrhea, as well as weakness.  He has not been vaccinated for COVID-19.  Denies any hematochezia.  No chest pain or shortness of breath, per patient but he does note that he is having a difficult time ambulating or moving around unassisted due to his fatigue and weakness.  No history of pulmonary issues.  No home O2 requirement.    Past Medical History:  Diagnosis Date  . Depression   . Fatty liver   . Glucose intolerance (impaired glucose tolerance)   . HOH (hard of hearing)    bilateral hearing aides  . Hx of colonic polyps   . Hyperlipidemia   . Hypertension   . Neuromuscular disorder ([place])    neuropathy feet    Patient Active Problem List   Diagnosis Date Noted  . Pneumonia due to COVID-19 virus 12/23/2020  . COVID-19 12/27/2020  . Gout 08/05/2020  . Need for hepatitis C screening test 08/05/2020  . Onychomycosis of multiple toenails with type 2 diabetes mellitus ([place]) 08/06/2019  . 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 B12 deficiency 10/31/2018  . Thrombocytosis 10/29/2018  . GERD without esophagitis 01/22/2018  . Asymptomatic gallstones 09/09/2015  . Routine general medical examination at a health care facility 02/10/2015  . Prostate nodule with urinary obstruction 07/17/2011  . Type II diabetes mellitus with manifestations ([place]) 08/05/2009  . Diabetic neuropathy,  painful ([place]) 07/06/2009  . Hyperlipidemia with target LDL less than 100 04/22/2009  . Essential hypertension, benign 04/22/2009  . Erectile dysfunction associated with type 2 diabetes mellitus ([place]) 04/21/2009    Past Surgical History:  Procedure Laterality Date  . COLONOSCOPY  2006, 2009, 07/28/2011   2006 12 and 7 mm TVadenoma and adenoma 2009: 3 small adenomas 2012,:34mm rectal polyp  . COLONOSCOPY    . ORIF FINGER FRACTURE  02/08/2012   Procedure: OPEN REDUCTION INTERNAL FIXATION (ORIF) METACARPAL (FINGER) FRACTURE;  Surgeon: Cammie Sickle., MD;  Location: [facility];  Service: Orthopedics;  Laterality: Right;  right small finger middle phalanx  . SQUAMOUS CELL CARCINOMA EXCISION     on scalp and nose  . TIBIA FRACTURE SURGERY  2001   right with hardware  . VASECTOMY    . WRIST FRACTURE SURGERY  2002   right       Family History  Problem Relation Age of Onset  . Arrhythmia Mother   . Heart attack Mother   . Heart disease Mother   . Heart failure Father   . Diabetes Father   . Coronary artery disease Other        1st degree relative<60  . Alcohol abuse Other   . Diabetes Other   . Stomach cancer Sister   . Cancer Neg Hx   . Stroke Neg Hx   . Hyperlipidemia Neg Hx   . Hypertension Neg Hx   . Kidney disease Neg Hx  Social History   Tobacco Use  . Smoking status: Former Smoker    Quit date: 09/21/1969    Years since quitting: 51.3  . Smokeless tobacco: Never Used  Vaping Use  . Vaping Use: Never used  Substance Use Topics  . Alcohol use: Yes    Alcohol/week: 6.0 standard drinks    Types: 6 Cans of beer per week  . Drug use: No    Home Medications Prior to Admission medications   Medication Sig Start Date End Date Taking? Authorizing Provider  Accu-Chek Softclix Lancets lancets Use to check blood sugar daily. DX: E11.8 07/07/20   Janith Lima, MD  Alcohol Swabs (B-D SINGLE USE SWABS REGULAR) PADS Inject 1 Act into the skin See admin  instructions. 02/27/18   Janith Lima, MD  allopurinol (ZYLOPRIM) 100 MG tablet Take 1 tablet (100 mg total) by mouth daily. 11/15/20   Janith Lima, MD  atorvastatin (LIPITOR) 40 MG tablet TAKE 1 TABLET EVERY DAY 11/29/20   Janith Lima, MD  B Complex-C-Folic Acid (SUPER B COMPLEX/FA/VIT C) TABS Take by mouth.    [provider]  blood glucose meter kit and supplies KIT Dispense based on patient and insurance preference. Use to check to blood sugar daily. DX: E11.9 07/07/20   Janith Lima, MD  chlorthalidone (HYGROTON) 25 MG tablet Take 1 tablet (25 mg total) by mouth daily. 11/15/20   Janith Lima, MD  CREON [phone] units CPEP capsule TAKE 1 CAPSULE THREE TIMES DAILY WITH MEALS 09/18/20   Janith Lima, MD  cyanocobalamin 1000 MCG tablet Take 1 tablet (1,000 mcg total) by mouth daily. 02/05/20   Janith Lima, MD  glucose blood test strip Use to check to blood sugar daily. DX: E11.9 07/07/20   Janith Lima, MD  loperamide (IMODIUM) 2 MG capsule Take by mouth as needed for diarrhea or loose stools.    [provider]  metoprolol succinate (TOPROL-XL) 25 MG 24 hr tablet Take 1 tablet (25 mg total) by mouth daily. 11/04/20   Janith Lima, MD  MITIGARE 0.6 MG CAPS Take 1 capsule by mouth as needed.  06/26/20   [provider]  olmesartan (BENICAR) 40 MG tablet Take 1 tablet (40 mg total) by mouth daily. 09/22/20   Janith Lima, MD  tadalafil (CIALIS) 5 MG tablet Take 1 tablet (5 mg total) by mouth daily as needed for erectile dysfunction. 03/04/19   Janith Lima, MD    Allergies    Amlodipine and Prevnar [pneumococcal 13-val conj vacc]  Review of Systems   Review of Systems  All other systems reviewed and are negative. Ten systems reviewed and are negative for acute change, except as noted in the HPI.   Physical Exam Updated Vital Signs BP 129/67   Pulse 91   Temp 98.4 F (36.9 C)   Resp (!) 25   SpO2 90%   Physical Exam Vitals and  nursing note reviewed.  Constitutional:      General: He is not in acute distress.    Appearance: Normal appearance. He is not ill-appearing, toxic-appearing or diaphoretic.     Interventions: He is not intubated.    Comments: Well-developed elderly male.  Sitting upright.  Speaks in short sentences.  HENT:     Head: Normocephalic and atraumatic.     Right Ear: External ear normal.     Left Ear: External ear normal.     Nose: Nose normal.  Mouth/Throat:     Mouth: Mucous membranes are moist.     Pharynx: Oropharynx is clear. No oropharyngeal exudate or posterior oropharyngeal erythema.  Eyes:     Extraocular Movements: Extraocular movements intact.  Cardiovascular:     Rate and Rhythm: Regular rhythm. Tachycardia present.     Pulses: Normal pulses.     Heart sounds: Normal heart sounds. No murmur heard. No friction rub. No gallop.      Comments: Tachycardic.  No murmurs, rubs, or gallops. Pulmonary:     Effort: Pulmonary effort is normal. No tachypnea, bradypnea, accessory muscle usage or respiratory distress. He is not intubated.     Breath sounds: No stridor. Rales present. No decreased breath sounds, wheezing or rhonchi.     Comments: Crackles noted in the bilateral lung bases, right greater than left.  Oxygen saturations in the mid 80s on room air.  Improved to low 90s with 3 L via nasal cannula. Abdominal:     General: Abdomen is flat.     Tenderness: There is no abdominal tenderness.  Musculoskeletal:        General: Normal range of motion.     Cervical back: Normal range of motion and neck supple. No tenderness.     Right lower leg: No tenderness. No edema.     Left lower leg: No tenderness. No edema.  Skin:    General: Skin is warm and dry.  Neurological:     General: No focal deficit present.     Mental Status: He is alert and oriented to person, place, and time.  Psychiatric:        Mood and Affect: Mood normal.        Behavior: Behavior normal.    ED Results  / Procedures / Treatments   Labs (all labs ordered are listed, but only abnormal results are displayed) Labs Reviewed  CBC WITH DIFFERENTIAL/PLATELET - Abnormal; Notable for the following components:      Result Value   MCV 77.6 (*)    Lymphs Abs 0.4 (*)    All other components within normal limits  COMPREHENSIVE METABOLIC PANEL - Abnormal; Notable for the following components:   Sodium 131 (*)    CO2 20 (*)    Glucose, Bld 152 (*)    BUN 38 (*)    Creatinine, Ser 1.27 (*)    Calcium 8.5 (*)    AST 52 (*)    GFR, Estimated 57 (*)    All other components within normal limits  D-DIMER, QUANTITATIVE (NOT AT [facility]) - Abnormal; Notable for the following components:   D-Dimer, Quant 0.63 (*)    All other components within normal limits  LACTATE DEHYDROGENASE - Abnormal; Notable for the following components:   LDH 289 (*)    All other components within normal limits  FIBRINOGEN - Abnormal; Notable for the following components:   Fibrinogen 507 (*)    All other components within normal limits  C-REACTIVE PROTEIN - Abnormal; Notable for the following components:   CRP 5.0 (*)    All other components within normal limits  RESP PANEL BY RT-PCR (FLU A&B, COVID) ARPGX2  CULTURE, BLOOD (ROUTINE X 2)  CULTURE, BLOOD (ROUTINE X 2)  LACTIC ACID, PLASMA  FERRITIN  TRIGLYCERIDES  LACTIC ACID, PLASMA  PROCALCITONIN  CBC WITH DIFFERENTIAL/PLATELET  COMPREHENSIVE METABOLIC PANEL  C-REACTIVE PROTEIN   EKG None  Radiology DG Chest Port 1 View  Result Date: 01/16/2021 CLINICAL DATA:  Cough and dyspnea, COVID positive 14 days  prior EXAM: PORTABLE CHEST 1 VIEW COMPARISON:  12/29/2020 chest radiograph. FINDINGS: Low lung volumes. Stable cardiomediastinal silhouette with top-normal heart size. No pneumothorax. No pleural effusion. Moderate patchy opacities throughout the peripheral mid to lower lungs bilaterally, worsened. IMPRESSION: Worsening moderate patchy opacities throughout the peripheral  mid to lower lungs bilaterally, compatible with COVID-19 pneumonia. Electronically Signed   By: Ilona Sorrel M.D.   On: 12/31/2020 17:44   Procedures .Critical Care Performed by: Rayna Sexton, PA-C Authorized by: Rayna Sexton, PA-C   Critical care provider statement:    Critical care time (minutes):  30   Critical care was necessary to treat or prevent imminent or life-threatening deterioration of the following conditions:  Respiratory failure   Critical care was time spent personally by me on the following activities:  Discussions with consultants, evaluation of patient's response to treatment, examination of patient, ordering and performing treatments and interventions, ordering and review of laboratory studies, ordering and review of radiographic studies, pulse oximetry, re-evaluation of patient's condition, obtaining history from patient or surrogate and review of old charts    Medications Ordered in ED Medications  enoxaparin (LOVENOX) injection 40 mg (has no administration in time range)  methylPREDNISolone sodium succinate (SOLU-MEDROL) 125 mg/2 mL injection 59.375 mg (has no administration in time range)    Followed by  predniSONE (DELTASONE) tablet 50 mg (has no administration in time range)  sodium chloride 0.9 % bolus 500 mL (0 mLs Intravenous Stopped 01/08/2021 1821)  dexamethasone (DECADRON) injection 10 mg (10 mg Intravenous Given 01/12/2021 1722)   ED Course  I have reviewed the triage vital signs and the nursing notes.  Pertinent labs & imaging results that were available during my care of the patient were reviewed by me and considered in my medical decision making (see chart for details).  Clinical Course as of 12/31/2020 1837  Sat Jan 01, 2021  1803 DG Chest [place] 1 View IMPRESSION: Worsening moderate patchy opacities  throughout the peripheral mid to lower lungs bilaterally, compatible with COVID-19  pneumonia. [LJ]  1817 Fibrinogen(!): 507 [LJ]  1817 D-Dimer,  Quant(!): 0.63 [LJ]  1817 Creatinine(!): 1.27 [LJ]  1817 GFR, Estimated(!): 57 [LJ]    Clinical Course User Index [LJ] Rayna Sexton, PA-C   MDM Rules/Calculators/A&P                          She is a 80 year old male who presents the emergency department due to fatigue, weakness, worsening symptoms related to COVID-19.  Patient states he began experiencing symptoms on January 31 and then tested positive on February 3.  Was initially experiencing URI symptoms which mostly resolved but has continued to experience the prior mentioned symptoms which have been continually worsening.  Now reports difficulty ambulating as well as moving himself around due to fatigue and weakness.  Chest x-ray showing worsening moderate patchy opacities of the peripheral mid to lower lungs bilaterally which is compatible with COVID-19.  Patient is not vaccinated for COVID-19.  Labs today are consistent with COVID-19 infection.  Patient hypoxic to the mid 80s on room air.  No history of pulmonary issues or home O2 requirement.  Patient saturating in the low 90s on 4 L via nasal cannula.  Given patient's age, hypoxia, severity of his symptoms, feel that admission is necessary.  Will discuss with the medicine team at this time.  Final Clinical Impression(s) / ED Diagnoses Final diagnoses:  [id number]  Hypoxia   Rx / DC Orders ED  Discharge Orders    None       Rayna Sexton, [place] 01/10/2021 1837    Charlesetta Shanks, MD 01/10/21 1659

## 2021-01-01 NOTE — H&P (Signed)
History and Physical    Thomas Chung TJQ:[id number] DOB: January 29, 1941 DOA: 12/29/2020  PCP: Janith Lima, MD  Patient coming from: Home  I have personally briefly reviewed patient's old medical records in [place]  Chief Complaint: worsening weakness and diarrhea   HPI: Thomas Chung is a 80 y.o. male with medical history significant for controlled type 2 diabetes with neuropathy, pancreatic insufficiency, hypertension, hyperlipidemia and GERD who presents with progressive worsening weakness and diarrhea.  Patient reports that starting on 1/30 he began to notice symptoms of weakness, cough and intermittent fever. He then tested positive for Covid outpatient. He was followed by a post Covid care clinic and treated conservatively. He also has been having almost daily diarrhea. Has no appetite. States his oxygen check with pulse ox at home has been around 90%. He denies any sensation of shortness of breath. No chest pain. No nausea vomiting abdominal pain. Patient is unvaccinated for COVID-19. Thinks he could have contracted this at a [employer].  ED Course: He was tachycardic and tachypneic and placed on 5 L via nasal cannula.  CBC shows no leukocytosis. Creatinine mildly elevated to 1.27. CRP of 5. Lactic acid of 1.4. D-dimer is 0.63. Fibrinogen 507.   Chest x-ray with findings consistent with Covid pneumonia.  Review of Systems:  Constitutional: No Weight Change, + Fever ENT/Mouth: No sore throat, No Rhinorrhea Eyes: No Eye Pain, No Vision Changes Cardiovascular: No Chest Pain, no SOB Respiratory: + Cough, No Sputum, No Wheezing, no Dyspnea  Gastrointestinal: No Nausea, No Vomiting, +Diarrhea, No Constipation, No Pain Genitourinary: no Urinary Incontinence, No Urgency, No Flank Pain Musculoskeletal: No Arthralgias, No Myalgias Skin: No Skin Lesions, No Pruritus, Neuro: no Weakness, No Numbness,  No Loss of Consciousness, No Syncope Psych: No Anxiety/Panic, No  Depression,+ decrease appetite Heme/Lymph: No Bruising, No Bleeding Past Medical History:  Diagnosis Date  . Depression   . Fatty liver   . Glucose intolerance (impaired glucose tolerance)   . HOH (hard of hearing)    bilateral hearing aides  . Hx of colonic polyps   . Hyperlipidemia   . Hypertension   . Neuromuscular disorder ([place])    neuropathy feet    Past Surgical History:  Procedure Laterality Date  . COLONOSCOPY  2006, 2009, 07/28/2011   2006 12 and 7 mm TVadenoma and adenoma 2009: 3 small adenomas 2012,:87m rectal polyp  . COLONOSCOPY    . ORIF FINGER FRACTURE  02/08/2012   Procedure: OPEN REDUCTION INTERNAL FIXATION (ORIF) METACARPAL (FINGER) FRACTURE;  Surgeon: RCammie Sickle, MD;  Location: [facility]  Service: Orthopedics;  Laterality: Right;  right small finger middle phalanx  . SQUAMOUS CELL CARCINOMA EXCISION     on scalp and nose  . TIBIA FRACTURE SURGERY  2001   right with hardware  . VASECTOMY    . WRIST FRACTURE SURGERY  2002   right     reports that he quit smoking about 51 years ago. He has never used smokeless tobacco. He reports current alcohol use of about 6.0 standard drinks of alcohol per week. He reports that he does not use drugs. Social History  Allergies  Allergen Reactions  . Amlodipine Swelling  . Prevnar [Pneumococcal 13-Val Conj Vacc]     Joint pain, swelling    Family History  Problem Relation Age of Onset  . Arrhythmia Mother   . Heart attack Mother   . Heart disease Mother   . Heart failure Father   .  Diabetes Father   . Coronary artery disease Other        1st degree relative<60  . Alcohol abuse Other   . Diabetes Other   . Stomach cancer Sister   . Cancer Neg Hx   . Stroke Neg Hx   . Hyperlipidemia Neg Hx   . Hypertension Neg Hx   . Kidney disease Neg Hx      Prior to Admission medications   Medication Sig Start Date End Date Taking? Authorizing Provider  Accu-Chek Softclix Lancets lancets Use to  check blood sugar daily. DX: E11.8 07/07/20   Janith Lima, MD  Alcohol Swabs (B-D SINGLE USE SWABS REGULAR) PADS Inject 1 Act into the skin See admin instructions. 02/27/18   Janith Lima, MD  allopurinol (ZYLOPRIM) 100 MG tablet Take 1 tablet (100 mg total) by mouth daily. 11/15/20   Janith Lima, MD  atorvastatin (LIPITOR) 40 MG tablet TAKE 1 TABLET EVERY DAY 11/29/20   Janith Lima, MD  B Complex-C-Folic Acid (SUPER B COMPLEX/FA/VIT C) TABS Take by mouth.    [provider]  blood glucose meter kit and supplies KIT Dispense based on patient and insurance preference. Use to check to blood sugar daily. DX: E11.9 07/07/20   Janith Lima, MD  chlorthalidone (HYGROTON) 25 MG tablet Take 1 tablet (25 mg total) by mouth daily. 11/15/20   Janith Lima, MD  CREON [phone] units CPEP capsule TAKE 1 CAPSULE THREE TIMES DAILY WITH MEALS 09/18/20   Janith Lima, MD  cyanocobalamin 1000 MCG tablet Take 1 tablet (1,000 mcg total) by mouth daily. 02/05/20   Janith Lima, MD  glucose blood test strip Use to check to blood sugar daily. DX: E11.9 07/07/20   Janith Lima, MD  loperamide (IMODIUM) 2 MG capsule Take by mouth as needed for diarrhea or loose stools.    [provider]  metoprolol succinate (TOPROL-XL) 25 MG 24 hr tablet Take 1 tablet (25 mg total) by mouth daily. 11/04/20   Janith Lima, MD  MITIGARE 0.6 MG CAPS Take 1 capsule by mouth as needed.  06/26/20   [provider]  olmesartan (BENICAR) 40 MG tablet Take 1 tablet (40 mg total) by mouth daily. 09/22/20   Janith Lima, MD  tadalafil (CIALIS) 5 MG tablet Take 1 tablet (5 mg total) by mouth daily as needed for erectile dysfunction. 03/04/19   Janith Lima, MD    Physical Exam: Vitals:   12/28/2020 1730 12/27/2020 1745 01/05/2021 1800 12/22/2020 1815  BP: 137/73 132/69 136/73 129/67  Pulse: 95 94 94 91  Resp: (!) 21 (!) 21 (!) 29 (!) 25  Temp:      SpO2: 90% 90% 90% 90%    Constitutional:  NAD, calm, comfortable, mildly ill-appearing obese male laying at 40 degree incline in bed Vitals:   01/07/2021 1730 12/22/2020 1745 01/07/2021 1800 12/23/2020 1815  BP: 137/73 132/69 136/73 129/67  Pulse: 95 94 94 91  Resp: (!) 21 (!) 21 (!) 29 (!) 25  Temp:      SpO2: 90% 90% 90% 90%   Eyes: PERRL, lids and conjunctivae normal ENMT: Mucous membranes are moist.  Neck: normal, supple Respiratory: Bibasilar crackles. Normal respiratory effort on 5 L via nasal cannula. Able to speak in full sentences. No accessory muscle use.  Cardiovascular: Regular rate and rhythm, no murmurs / rubs / gallops. No extremity edema.  Abdomen: no tenderness, no masses palpated.  Bowel sounds positive.  Musculoskeletal: no clubbing / cyanosis. No joint deformity upper and lower extremities. Good ROM, no contractures. Normal muscle tone.  Skin: no rashes, lesions, ulcers. No induration Neurologic: CN 2-12 grossly intact. Sensation intact,  Strength 4/5 in all 4.  Psychiatric: Normal judgment and insight. Alert and oriented x 3. Normal mood.     Labs on Admission: I have personally reviewed following labs and imaging studies  CBC: Recent Labs  Lab 12/29/20 1123 01/05/2021 1726  WBC 6.6 8.3  NEUTROABS  --  7.1  HGB 14.8 14.7  HCT 44.9 43.9  MCV 78* 77.6*  PLT 353 093   Basic Metabolic Panel: Recent Labs  Lab 12/29/20 1123 01/17/2021 1726  NA 133* 131*  K 4.5 3.5  CL 95* 98  CO2 17* 20*  GLUCOSE 97 152*  BUN 36* 38*  CREATININE 1.57* 1.27*  CALCIUM 9.0 8.5*   GFR: Estimated Creatinine Clearance: 66.3 mL/min (A) (by C-G formula based on SCr of 1.27 mg/dL (H)). Liver Function Tests: Recent Labs  Lab 12/29/20 1123 12/27/2020 1726  AST 37 52*  ALT 26 34  ALKPHOS 75 55  BILITOT 0.5 1.0  PROT 6.5 6.8  ALBUMIN 4.3 3.8   No results for input(s): LIPASE, AMYLASE in the last 168 hours. No results for input(s): AMMONIA in the last 168 hours. Coagulation Profile: No results for input(s): INR, PROTIME  in the last 168 hours. Cardiac Enzymes: No results for input(s): CKTOTAL, CKMB, CKMBINDEX, TROPONINI in the last 168 hours. BNP (last 3 results) No results for input(s): PROBNP in the last 8760 hours. HbA1C: No results for input(s): HGBA1C in the last 72 hours. CBG: No results for input(s): GLUCAP in the last 168 hours. Lipid Profile: Recent Labs    12/23/2020 1727  TRIG 65   Thyroid Function Tests: No results for input(s): TSH, T4TOTAL, FREET4, T3FREE, THYROIDAB in the last 72 hours. Anemia Panel: Recent Labs    01/05/2021 1726  FERRITIN 221   Urine analysis:    Component Value Date/Time   COLORURINE YELLOW 02/03/2020 [place] 02/03/2020 1356   LABSPEC 1.020 02/03/2020 1356   PHURINE 6.5 02/03/2020 1356   GLUCOSEU NEGATIVE 02/03/2020 1356   HGBUR NEGATIVE 02/03/2020 [place] 02/03/2020 1356   [place] 02/03/2020 [place] 08/16/2010 2219   UROBILINOGEN 0.2 02/03/2020 1356   NITRITE NEGATIVE 02/03/2020 1356   LEUKOCYTESUR NEGATIVE 02/03/2020 1356    Radiological Exams on Admission: DG Chest Port 1 View  Result Date: 01/03/2021 CLINICAL DATA:  Cough and dyspnea, COVID positive 14 days prior EXAM: PORTABLE CHEST 1 VIEW COMPARISON:  12/29/2020 chest radiograph. FINDINGS: Low lung volumes. Stable cardiomediastinal silhouette with top-normal heart size. No pneumothorax. No pleural effusion. Moderate patchy opacities throughout the peripheral mid to lower lungs bilaterally, worsened. IMPRESSION: Worsening moderate patchy opacities throughout the peripheral mid to lower lungs bilaterally, compatible with COVID-19 pneumonia. Electronically Signed   By: Ilona Sorrel M.D.   On: 01/02/2021 17:44      Assessment/Plan  Acute hypoxic respiratory failure secondary to COVID pneumonia On 5L  Maintain O2 > 92%  Daily IV Solu-Medrol Daily remdesivir CRP of 5. Continue to monitor with daily labs. He has mildly elevated  D-dimer but unimpressive for Covid patients that I have seen with PE. Continue to monitor oxygen saturation closely with treatment and can consider CTA chest if clinically worsens.  Mildly elevated creatinine Creatinine of 1.27.  Repeat with morning labs following continuous IV 100 cc  normal saline fluid  Type 2 diabetes Hemoglobin A1c of 6.7 in December 2021 Diet controlled  Hypertension Continue metoprolol, hold chlorthalidone and olmesartan for elevated creatinine  Hyperlipidemia Continue statin  Pancreatic insufficiency Continue Creon  DVT prophylaxis:.Lovenox Code Status: Full Family Communication: Plan discussed with patient at bedside  disposition Plan: Home with at least 2 midnight stays  Consults called:  Admission status: inpatient  Level of care: Telemetry  Status is: Inpatient  Remains inpatient appropriate because:Inpatient level of care appropriate due to severity of illness   Dispo: The patient is from: Home              Anticipated d/c is to: Home              Anticipated d/c date is: 3 days              Patient currently is not medically stable to d/c.   Difficult to place patient No         Orene Desanctis DO [facility]   If 7PM-7AM, please contact night-coverage [email or web address]   01/10/2021, 7:02 PM

## 2021-01-01 NOTE — ED Notes (Signed)
External condom catheter placed on patient  

## 2021-01-01 NOTE — ED Notes (Signed)
Report called to 4W

## 2021-01-01 NOTE — ED Triage Notes (Signed)
EMS reports from home, tested positive Covid 14 days ago, c/o diarrhea x 7 days, weakness and SOB as well. SP02 RA onsite 88.  BP 156/82 HR 100 RR20 Sp02 92

## 2021-01-01 NOTE — ED Notes (Signed)
Fall bundle: Yellow wristband Yellow socks Bed alarm on Fall risk sign outside door 

## 2021-01-01 NOTE — ED Notes (Signed)
Date and time results received: 01/04/2021 1839    Test: covid Critical Value: positive  Name of Provider Notified: Tu  Orders Received? Or Actions Taken?:

## 2021-01-02 DIAGNOSIS — U071 COVID-19: Secondary | ICD-10-CM | POA: Diagnosis not present

## 2021-01-02 DIAGNOSIS — R7989 Other specified abnormal findings of blood chemistry: Secondary | ICD-10-CM

## 2021-01-02 DIAGNOSIS — J069 Acute upper respiratory infection, unspecified: Secondary | ICD-10-CM

## 2021-01-02 LAB — COMPREHENSIVE METABOLIC PANEL
ALT: 32 U/L (ref 0–44)
AST: 47 U/L — ABNORMAL HIGH (ref 15–41)
Albumin: 3.3 g/dL — ABNORMAL LOW (ref 3.5–5.0)
Alkaline Phosphatase: 51 U/L (ref 38–126)
Anion gap: 11 (ref 5–15)
BUN: 33 mg/dL — ABNORMAL HIGH (ref 8–23)
CO2: 21 mmol/L — ABNORMAL LOW (ref 22–32)
Calcium: 8.1 mg/dL — ABNORMAL LOW (ref 8.9–10.3)
Chloride: 102 mmol/L (ref 98–111)
Creatinine, Ser: 1.02 mg/dL (ref 0.61–1.24)
GFR, Estimated: >60 mL/min (ref >60)
Glucose, Bld: 201 mg/dL — ABNORMAL HIGH (ref 70–99)
Potassium: 3.7 mmol/L (ref 3.5–5.1)
Sodium: 134 mmol/L — ABNORMAL LOW (ref 135–145)
Total Bilirubin: 0.8 mg/dL (ref 0.3–1.2)
Total Protein: 6.3 g/dL — ABNORMAL LOW (ref 6.5–8.1)

## 2021-01-02 LAB — CBC WITH DIFFERENTIAL/PLATELET
Abs Immature Granulocytes: 0.01 10*3/uL (ref 0.00–0.07)
Basophils Absolute: 0 10*3/uL (ref 0.0–0.1)
Basophils Relative: 0 %
Eosinophils Absolute: 0 10*3/uL (ref 0.0–0.5)
Eosinophils Relative: 0 %
HCT: 42.4 % (ref 39.0–52.0)
Hemoglobin: 13.8 g/dL (ref 13.0–17.0)
Immature Granulocytes: 0 %
Lymphocytes Relative: 10 %
Lymphs Abs: 0.4 10*3/uL — ABNORMAL LOW (ref 0.7–4.0)
MCH: 25.5 pg — ABNORMAL LOW (ref 26.0–34.0)
MCHC: 32.5 g/dL (ref 30.0–36.0)
MCV: 78.4 fL — ABNORMAL LOW (ref 80.0–100.0)
Monocytes Absolute: 0.3 10*3/uL (ref 0.1–1.0)
Monocytes Relative: 9 %
Neutro Abs: 3.1 10*3/uL (ref 1.7–7.7)
Neutrophils Relative %: 81 %
Platelets: 280 10*3/uL (ref 150–400)
RBC: 5.41 MIL/uL (ref 4.22–5.81)
RDW: 14.9 % (ref 11.5–15.5)
WBC: 3.8 10*3/uL — ABNORMAL LOW (ref 4.0–10.5)
nRBC: 0 % (ref 0.0–0.2)

## 2021-01-02 LAB — C-REACTIVE PROTEIN: CRP: 6.8 mg/dL — ABNORMAL HIGH (ref <1.0)

## 2021-01-02 LAB — GLUCOSE, CAPILLARY
Glucose-Capillary: 218 mg/dL — ABNORMAL HIGH (ref 70–99)
Glucose-Capillary: 241 mg/dL — ABNORMAL HIGH (ref 70–99)

## 2021-01-02 MED ORDER — SODIUM CHLORIDE 0.9 % IV SOLN: INTRAVENOUS | Status: AC

## 2021-01-02 MED ORDER — METHYLPREDNISOLONE SODIUM SUCC 125 MG IJ SOLR
60.0000 mg | Freq: Two times a day (BID) | INTRAMUSCULAR | Status: DC
  Administered 2021-01-02 – 2021-01-09 (×16): 60 mg via INTRAVENOUS
  Filled 2021-01-02 (×17): qty 2

## 2021-01-02 MED ORDER — ATORVASTATIN CALCIUM 40 MG PO TABS
40.0000 mg | ORAL_TABLET | Freq: Every day | ORAL | Status: DC
  Administered 2021-01-02 – 2021-01-04 (×3): 40 mg via ORAL
  Filled 2021-01-02 (×3): qty 1

## 2021-01-02 MED ORDER — METOPROLOL SUCCINATE ER 25 MG PO TB24
25.0000 mg | ORAL_TABLET | Freq: Every day | ORAL | Status: DC
  Administered 2021-01-02 – 2021-01-04 (×3): 25 mg via ORAL
  Filled 2021-01-02 (×3): qty 1

## 2021-01-02 MED ORDER — CHLORHEXIDINE GLUCONATE CLOTH 2 % EX PADS
6.0000 | MEDICATED_PAD | Freq: Every day | CUTANEOUS | Status: DC
  Administered 2021-01-02 – 2021-01-15 (×14): 6 via TOPICAL

## 2021-01-02 MED ORDER — SODIUM CHLORIDE 0.9 % IV SOLN: INTRAVENOUS | Status: DC

## 2021-01-02 MED ORDER — INSULIN ASPART 100 UNIT/ML ~~LOC~~ SOLN
0.0000 [IU] | Freq: Three times a day (TID) | SUBCUTANEOUS | Status: DC
  Administered 2021-01-02 – 2021-01-03 (×3): 3 [IU] via SUBCUTANEOUS
  Administered 2021-01-03: 2 [IU] via SUBCUTANEOUS
  Administered 2021-01-04: 3 [IU] via SUBCUTANEOUS
  Administered 2021-01-04: 1 [IU] via SUBCUTANEOUS
  Administered 2021-01-04: 5 [IU] via SUBCUTANEOUS
  Administered 2021-01-05: 3 [IU] via SUBCUTANEOUS

## 2021-01-02 MED ORDER — PANCRELIPASE (LIP-PROT-AMYL) 12000-38000 UNITS PO CPEP
36000.0000 [IU] | ORAL_CAPSULE | Freq: Three times a day (TID) | ORAL | Status: DC
  Administered 2021-01-02 – 2021-01-04 (×9): 36000 [IU] via ORAL
  Filled 2021-01-02 (×7): qty 3

## 2021-01-02 MED ORDER — VITAMIN B-12 1000 MCG PO TABS
1000.0000 ug | ORAL_TABLET | Freq: Every day | ORAL | Status: DC
  Administered 2021-01-02 – 2021-01-04 (×3): 1000 ug via ORAL
  Filled 2021-01-02 (×3): qty 1

## 2021-01-02 MED ORDER — INSULIN ASPART 100 UNIT/ML ~~LOC~~ SOLN
0.0000 [IU] | Freq: Every day | SUBCUTANEOUS | Status: DC
  Administered 2021-01-02 – 2021-01-03 (×2): 2 [IU] via SUBCUTANEOUS

## 2021-01-02 MED ORDER — ALLOPURINOL 100 MG PO TABS
100.0000 mg | ORAL_TABLET | Freq: Every day | ORAL | Status: DC
  Administered 2021-01-02 – 2021-01-04 (×3): 100 mg via ORAL
  Filled 2021-01-02 (×3): qty 1

## 2021-01-02 NOTE — Progress Notes (Signed)
PROGRESS NOTE  Thomas Chung  DOB: 25-Aug-1941  PCP: Janith Lima, MD IWP:[id number]  DOA: 01/05/2021  LOS: 1 day   Chief Complaint  Patient presents with  . Shortness of Breath  . Weakness  . Diarrhea   Brief narrative: Thomas Chung is a 80 y.o. male with PMH significant for T2DM, HTN, HLD, peripheral neuropathy, pancreatic insufficiency, GERD . Patient presented to the ED on 2/12 with progressive weakness and diarrhea.  Symptoms started 2 weeks ago with weakness, cough, intermittent fever.  He tested positive for Covid on February second as an outpatient.  He was followed by a post Covid care clinic and treated conservatively.  He continues to have diarrhea, low oral intake and generalized weakness. Oxygen saturation was around 90% with pulse ox at home. Unvaccinated for COVID-19.  In the ED, patient was afebrile, heart rate 106, O2 sat 85% on room air, initially required 2 L and later 5 L by nasal cannula. Labs showed sodium low at 131, creatinine elevated 1.27, CRP elevated to 5, procalcitonin normal, lactic acid normal at 1.4. Chest x-ray showed worsening moderate patchy opacities throughout the peripheral mid to lower lungs bilaterally, compatible with COVID-19 pneumonia. Patient was admitted to hospitalist service for COVID pneumonia  Subjective: Patient was seen and examined this morning. Lying down in bed.  Feels weak.  Breathing better at 5 L/min.  He is more concerned about persistent diarrhea and messing up the place.  Okay to get a Flexi-Seal placed. Chart reviewed Afebrile, on 5 L this morning. Sodium 134, CRP up to 6.8  Assessment/Plan: COVID pneumonia Acute respiratory failure with hypoxia  -Presented with worsening Covid symptoms -COVID test: PCR positive for first time on February 2 -Chest imaging: Chest x-ray at admission showed worsening moderate patchy opacities throughout the peripheral mid to lower lungs bilaterally -Treatment: Patient is  currently on on 5-day course of IV remdesivir till 2/16, Solu-Medrol 60 mg daily -Oxygen - SpO2: 90 % O2 Flow Rate (L/min): 5 L/min -Supportive care: Vitamin C, Zinc, PRN inhalers, Tylenol, Antitussives (benzonatate/ Mucinex/Tussionex).   -Encouraged incentive spirometry, prone position, out of bed and early mobilization as much as possible -Continue airborne/contact isolation precautions for duration of 3 weeks from the day of diagnosis. -WBC and inflammatory markers trend as below.  Recent Labs  Lab 12/29/20 1123 01/10/2021 1655 01/17/2021 1725 12/23/2020 1726 01/02/21 0434  SARSCOV2NAA  --  POSITIVE*  --   --   --   WBC 6.6  --   --  8.3 3.8*  LATICACIDVEN  --   --  1.4  --   --   PROCALCITON  --   --   --  <0.10  --   DDIMER  --   --   --  0.63*  --   FERRITIN  --   --   --  221  --   LDH  --   --   --  289*  --   CRP  --   --   --  5.0* 6.8*  ALT 26  --   --  34 32   The treatment plan and use of medications and known side effects were discussed with patient/family. Some of the medications used are based on case reports/anecdotal data.  All other medications being used in the management of COVID-19 based on limited study data.  Complete risks and long-term side effects are unknown, however in the best clinical judgment they seem to be of some benefit.  Patient wanted to proceed with treatment options provided.  Persistent diarrhea Pancreatic insufficiency -Probably related to Covid itself. Okay to place Flexi-Seal.  Continue to monitor -Continue Creon, vitamin B12. -Start normal saline at 100 mill per hour.  CKD 3a -Baseline creatinine 1.33 from December 2021.  Creatinine remains stable at this presentation. Recent Labs    02/03/20 1356 08/05/20 1345 11/04/20 1429 12/29/20 1123 01/06/2021 1726 01/02/21 0434  BUN 35* 28* 28* 36* 38* 33*  CREATININE 1.18 1.20* 1.33 1.57* 1.27* 1.02   Type 2 diabetes mellitus -Diet controlled.  A1c 6.7 on 12/16. -Expect hyperglycemia with  steroids.  Sliding scale insulin with Accu-Cheks ordered. No results for input(s): GLUCAP in the last 168 hours.  Essential hypertension -Home meds include metoprolol succinate 25 mg daily, chlorthalidone 25 mg daily, olmesartan 40 mg daily.   -Continue metoprolol.  Chlorthalidone and olmesartan on hold.    Hyperlipidemia Continue statin  Mobility: Encourage ambulation.  PT  Code Status:   Code Status: Full Code  Nutritional status: Body mass index is 31.21 kg/m.     Diet Order            Diet Heart Room service appropriate? Yes; Fluid consistency: Thin  Diet effective now                 DVT prophylaxis: Lovenox subcu   Antimicrobials:  None Fluid: NS at 100 mill per hour Consultants: none Family Communication: none at bedside  Status is: Inpatient  Remains inpatient appropriate because -needs IV hydration, treatment for Covid  Dispo: The patient is from: Home              Anticipated d/c is to: Home              Anticipated d/c date is: > 3 days              Patient currently is not medically stable to d/c.   Difficult to place patient No       Infusions:  . sodium chloride    . sodium chloride    . remdesivir 100 mg in NS 100 mL 100 mg (01/02/21 1055)    Scheduled Meds: . allopurinol  100 mg Oral Daily  . atorvastatin  40 mg Oral Daily  . enoxaparin (LOVENOX) injection  60 mg Subcutaneous Q24H  . insulin aspart  0-5 Units Subcutaneous QHS  . insulin aspart  0-9 Units Subcutaneous TID WC  . lipase/protease/amylase  36,000 Units Oral TID WC  . methylPREDNISolone (SOLU-MEDROL) injection  60 mg Intravenous Q12H  . metoprolol succinate  25 mg Oral Daily  . ondansetron (ZOFRAN) IV  4 mg Intravenous Once  . cyanocobalamin  1,000 mcg Oral Daily    Antimicrobials: Anti-infectives (From admission, onward)   Start     Dose/Rate Route Frequency Ordered Stop   01/02/21 1000  remdesivir 100 mg in sodium chloride 0.9 % 100 mL IVPB       "Followed by"  Linked Group Details   100 mg 200 mL/hr over 30 Minutes Intravenous Daily 01/14/2021 1845 01/06/21 0959   01/17/2021 2000  remdesivir 200 mg in sodium chloride 0.9% 250 mL IVPB       "Followed by" Linked Group Details   200 mg 580 mL/hr over 30 Minutes Intravenous Once 01/05/2021 1845 01/14/2021 2017      PRN meds: benzonatate   Objective: Vitals:   01/14/2021 2323 01/02/21 0402  BP: 125/72 129/69  Pulse: 72 64  Resp: (!) 22 20  Temp: (!) 97.5 F (36.4 C) 97.7 F (36.5 C)  SpO2: 92% 90%    Intake/Output Summary (Last 24 hours) at 01/02/2021 1226 Last data filed at 01/02/2021 0400 Gross per 24 hour  Intake 1428.33 ml  Output -  Net 1428.33 ml   Filed Weights   01/10/2021 2125 01/13/2021 2306  Weight: 116.3 kg 116.3 kg   Weight change:  Body mass index is 31.21 kg/m.   Physical Exam: General exam: Pleasant elderly Caucasian male.  Feels weak Skin: No rashes, lesions or ulcers. HEENT: Atraumatic, normocephalic, no obvious bleeding Lungs: Clear to auscultation bilaterally CVS: Regular rate and rhythm, no murmur GI/Abd soft, nontender, nondistended, bowel sound present CNS: Alert, awake, oriented x3 Psychiatry: Mood appropriate Extremities: No pedal edema, no calf tenderness  Data Review: I have personally reviewed the laboratory data and studies available.  Recent Labs  Lab 12/29/20 1123 12/28/2020 1726 01/02/21 0434  WBC 6.6 8.3 3.8*  NEUTROABS  --  7.1 3.1  HGB 14.8 14.7 13.8  HCT 44.9 43.9 42.4  MCV 78* 77.6* 78.4*  PLT 353 326 280   Recent Labs  Lab 12/29/20 1123 12/31/2020 1726 01/02/21 0434  NA 133* 131* 134*  K 4.5 3.5 3.7  CL 95* 98 102  CO2 17* 20* 21*  GLUCOSE 97 152* 201*  BUN 36* 38* 33*  CREATININE 1.57* 1.27* 1.02  CALCIUM 9.0 8.5* 8.1*    F/u labs  Unresulted Labs (From admission, onward)          Start     Ordered   01/02/21 0500  CBC with Differential/Platelet  Daily,   R      12/26/2020 1837   01/02/21 0500  Comprehensive metabolic panel   Daily,   R      12/25/2020 1837   01/02/21 0500  C-reactive protein  Daily,   R      12/22/2020 1837   12/24/2020 1655  Blood Culture (routine x 2)  BLOOD CULTURE X 2,   STAT      01/17/2021 1655          Signed, Terrilee Croak, MD [facility] 01/02/2021

## 2021-01-03 ENCOUNTER — Ambulatory Visit: Payer: Medicare HMO

## 2021-01-03 DIAGNOSIS — U071 COVID-19: Secondary | ICD-10-CM | POA: Diagnosis not present

## 2021-01-03 DIAGNOSIS — J069 Acute upper respiratory infection, unspecified: Secondary | ICD-10-CM | POA: Diagnosis not present

## 2021-01-03 LAB — COMPREHENSIVE METABOLIC PANEL
ALT: 41 U/L (ref 0–44)
AST: 54 U/L — ABNORMAL HIGH (ref 15–41)
Albumin: 3.1 g/dL — ABNORMAL LOW (ref 3.5–5.0)
Alkaline Phosphatase: 50 U/L (ref 38–126)
Anion gap: 10 (ref 5–15)
BUN: 37 mg/dL — ABNORMAL HIGH (ref 8–23)
CO2: 23 mmol/L (ref 22–32)
Calcium: 8 mg/dL — ABNORMAL LOW (ref 8.9–10.3)
Chloride: 103 mmol/L (ref 98–111)
Creatinine, Ser: 1.11 mg/dL (ref 0.61–1.24)
GFR, Estimated: >60 mL/min (ref >60)
Glucose, Bld: 176 mg/dL — ABNORMAL HIGH (ref 70–99)
Potassium: 4 mmol/L (ref 3.5–5.1)
Sodium: 136 mmol/L (ref 135–145)
Total Bilirubin: 0.8 mg/dL (ref 0.3–1.2)
Total Protein: 5.9 g/dL — ABNORMAL LOW (ref 6.5–8.1)

## 2021-01-03 LAB — C-REACTIVE PROTEIN: CRP: 3.3 mg/dL — ABNORMAL HIGH (ref <1.0)

## 2021-01-03 LAB — CBC WITH DIFFERENTIAL/PLATELET
Abs Immature Granulocytes: 0.02 10*3/uL (ref 0.00–0.07)
Basophils Absolute: 0 10*3/uL (ref 0.0–0.1)
Basophils Relative: 0 %
Eosinophils Absolute: 0 10*3/uL (ref 0.0–0.5)
Eosinophils Relative: 0 %
HCT: 42.3 % (ref 39.0–52.0)
Hemoglobin: 13.8 g/dL (ref 13.0–17.0)
Immature Granulocytes: 0 %
Lymphocytes Relative: 7 %
Lymphs Abs: 0.5 10*3/uL — ABNORMAL LOW (ref 0.7–4.0)
MCH: 25.7 pg — ABNORMAL LOW (ref 26.0–34.0)
MCHC: 32.6 g/dL (ref 30.0–36.0)
MCV: 78.6 fL — ABNORMAL LOW (ref 80.0–100.0)
Monocytes Absolute: 0.5 10*3/uL (ref 0.1–1.0)
Monocytes Relative: 7 %
Neutro Abs: 5.9 10*3/uL (ref 1.7–7.7)
Neutrophils Relative %: 86 %
Platelets: 339 10*3/uL (ref 150–400)
RBC: 5.38 MIL/uL (ref 4.22–5.81)
RDW: 15.2 % (ref 11.5–15.5)
WBC: 6.8 10*3/uL (ref 4.0–10.5)
nRBC: 0 % (ref 0.0–0.2)

## 2021-01-03 LAB — GLUCOSE, CAPILLARY
Glucose-Capillary: 165 mg/dL — ABNORMAL HIGH (ref 70–99)
Glucose-Capillary: 219 mg/dL — ABNORMAL HIGH (ref 70–99)
Glucose-Capillary: 217 mg/dL — ABNORMAL HIGH (ref 70–99)
Glucose-Capillary: 246 mg/dL — ABNORMAL HIGH (ref 70–99)

## 2021-01-03 NOTE — Progress Notes (Signed)
PROGRESS NOTE  Thomas Chung  DOB: 02-26-1941  PCP: Janith Lima, MD EHU:[id number]  DOA: 12/23/2020  LOS: 2 days   Chief Complaint  Patient presents with  . Shortness of Breath  . Weakness  . Diarrhea   Brief narrative: Thomas Chung is a 80 y.o. male with PMH significant for T2DM, HTN, HLD, peripheral neuropathy, pancreatic insufficiency, GERD . Patient presented to the ED on 2/12 with progressive weakness and diarrhea.  Symptoms started 2 weeks ago with weakness, cough, intermittent fever.  He tested positive for Covid on February second as an outpatient.  He was followed by a post Covid care clinic and treated conservatively.  He continues to have diarrhea, low oral intake and generalized weakness. Oxygen saturation was around 90% with pulse ox at home. Unvaccinated for COVID-19.  In the ED, patient was afebrile, heart rate 106, O2 sat 85% on room air, initially required 2 L and later 5 L by nasal cannula. Labs showed sodium low at 131, creatinine elevated 1.27, CRP elevated to 5, procalcitonin normal, lactic acid normal at 1.4. Chest x-ray showed worsening moderate patchy opacities throughout the peripheral mid to lower lungs bilaterally, compatible with COVID-19 pneumonia. Patient was admitted to hospitalist service for COVID pneumonia  Subjective: Patient was seen and examined this morning. Feels better today.  On less oxygen at 4 L/min today. He has a Flexi-Seal in place for diarrhea but no stool output overnight.  Assessment/Plan: COVID pneumonia Acute respiratory failure with hypoxia  -Presented with worsening Covid symptoms -COVID test: PCR positive for first time on February 2 -Chest imaging: Chest x-ray at admission showed worsening moderate patchy opacities throughout the peripheral mid to lower lungs bilaterally -Treatment: Patient is currently on on 5-day course of IV remdesivir till 2/16, Solu-Medrol 60 mg twice daily.  Continue the same.  Oxygen  requirement seems to be improving today. -Oxygen - SpO2: 93 % O2 Flow Rate (L/min): 7 L/min -Supportive care: Vitamin C, Zinc, PRN inhalers, Tylenol, Antitussives (benzonatate/ Mucinex/Tussionex).   -Encouraged incentive spirometry, prone position, out of bed and early mobilization as much as possible -Continue airborne/contact isolation precautions for duration of 3 weeks from the day of diagnosis. -WBC and inflammatory markers trend as below.  Recent Labs  Lab 12/29/20 1123 12/21/2020 1655 01/05/2021 1725 01/04/2021 1726 01/02/21 0434 01/03/21 0253  SARSCOV2NAA  --  POSITIVE*  --   --   --   --   WBC 6.6  --   --  8.3 3.8* 6.8  LATICACIDVEN  --   --  1.4  --   --   --   PROCALCITON  --   --   --  <0.10  --   --   DDIMER  --   --   --  0.63*  --   --   FERRITIN  --   --   --  221  --   --   LDH  --   --   --  289*  --   --   CRP  --   --   --  5.0* 6.8* 3.3*  ALT 26  --   --  34 32 41   The treatment plan and use of medications and known side effects were discussed with patient/family. Some of the medications used are based on case reports/anecdotal data.  All other medications being used in the management of COVID-19 based on limited study data.  Complete risks and long-term side effects are unknown, however  in the best clinical judgment they seem to be of some benefit.  Patient wanted to proceed with treatment options provided.  Persistent diarrhea Pancreatic insufficiency -Probably related to Covid itself.  Flexi-Seal was inserted yesterday but patient has not had stool output overnight.  Keep it in for next 24 hours.  If no need, can take out tomorrow.  -Continue Creon, vitamin B12. -Reduce IV fluid rate to 50 mill per hour today  Acute urinary retention -2/13 on bladder scan, patient was noted to have significant urinary retention.  Foley catheter was inserted which drained 2.4 L of urine.  Keep Foley catheter in.  Voiding trial in the next several days probably as an outpatient.   Patient denies any history of BPH or LUTS prior to this illness.  CKD 3a -Baseline creatinine 1.33 from December 2021.  Creatinine remains stable at this presentation. Recent Labs    02/03/20 1356 08/05/20 1345 11/04/20 1429 12/29/20 1123 12/22/2020 1726 01/02/21 0434 01/03/21 0253  BUN 35* 28* 28* 36* 38* 33* 37*  CREATININE 1.18 1.20* 1.33 1.57* 1.27* 1.02 1.11   Type 2 diabetes mellitus Hyperglycemia -Diet controlled.  A1c 6.7 on 12/16. -Blood sugar level is running high today because of steroids.  Continue sliding scale insulin with Accu-Cheks. Recent Labs  Lab 01/02/21 1602 01/02/21 2058 01/03/21 0756 01/03/21 1127  GLUCAP 218* 241* 165* 219*   Essential hypertension -Home meds include metoprolol succinate 25 mg daily, chlorthalidone 25 mg daily, olmesartan 40 mg daily.   -Continue metoprolol.  Chlorthalidone and olmesartan on hold.    Hyperlipidemia Continue statin  Mobility: Encourage ambulation.  PT ordered Code Status:   Code Status: Full Code  Nutritional status: Body mass index is 31.21 kg/m.     Diet Order            Diet Heart Room service appropriate? Yes; Fluid consistency: Thin  Diet effective now                 DVT prophylaxis: Lovenox subcu   Antimicrobials:  None Fluid: NS at 50 mill per hour Consultants: none Family Communication: none at bedside  Status is: Inpatient  Remains inpatient appropriate because -needs IV hydration, treatment for Covid  Dispo: The patient is from: Home              Anticipated d/c is to: Home              Anticipated d/c date is: > 3 days              Patient currently is not medically stable to d/c.   Difficult to place patient No       Infusions:  . sodium chloride    . remdesivir 100 mg in NS 100 mL 100 mg (01/03/21 0835)    Scheduled Meds: . allopurinol  100 mg Oral Daily  . atorvastatin  40 mg Oral Daily  . Chlorhexidine Gluconate Cloth  6 each Topical Daily  . enoxaparin (LOVENOX)  injection  60 mg Subcutaneous Q24H  . insulin aspart  0-5 Units Subcutaneous QHS  . insulin aspart  0-9 Units Subcutaneous TID WC  . lipase/protease/amylase  36,000 Units Oral TID WC  . methylPREDNISolone (SOLU-MEDROL) injection  60 mg Intravenous Q12H  . metoprolol succinate  25 mg Oral Daily  . ondansetron (ZOFRAN) IV  4 mg Intravenous Once  . cyanocobalamin  1,000 mcg Oral Daily    Antimicrobials: Anti-infectives (From admission, onward)   Start     Dose/Rate  Route Frequency Ordered Stop   01/02/21 1000  remdesivir 100 mg in sodium chloride 0.9 % 100 mL IVPB       "Followed by" Linked Group Details   100 mg 200 mL/hr over 30 Minutes Intravenous Daily 12/23/2020 1845 01/06/21 0959   01/16/2021 2000  remdesivir 200 mg in sodium chloride 0.9% 250 mL IVPB       "Followed by" Linked Group Details   200 mg 580 mL/hr over 30 Minutes Intravenous Once 12/28/2020 1845 01/17/2021 2017      PRN meds: benzonatate   Objective: Vitals:   01/03/21 0313 01/03/21 1209  BP: 134/63 140/68  Pulse:  72  Resp: 14 20  Temp: 98.4 F (36.9 C) 99 F (37.2 C)  SpO2: (!) 89% 93%    Intake/Output Summary (Last 24 hours) at 01/03/2021 1346 Last data filed at 01/03/2021 1200 Gross per 24 hour  Intake 376.01 ml  Output 4150 ml  Net -3773.99 ml   Filed Weights   12/30/2020 2125 01/04/2021 2306  Weight: 116.3 kg 116.3 kg   Weight change:  Body mass index is 31.21 kg/m.   Physical Exam: General exam: Pleasant elderly Caucasian male.  Feels better than yesterday Skin: No rashes, lesions or ulcers. HEENT: Atraumatic, normocephalic, no obvious bleeding Lungs: Clear to auscultation bilaterally CVS: Regular rate and rhythm, no murmur GI/Abd soft, nontender, nondistended, bowel sound present CNS: Alert, awake, oriented x3 Psychiatry: Mood appropriate Extremities: No pedal edema, no calf tenderness  Data Review: I have personally reviewed the laboratory data and studies available.  Recent Labs  Lab  12/29/20 1123 12/31/2020 1726 01/02/21 0434 01/03/21 0253  WBC 6.6 8.3 3.8* 6.8  NEUTROABS  --  7.1 3.1 5.9  HGB 14.8 14.7 13.8 13.8  HCT 44.9 43.9 42.4 42.3  MCV 78* 77.6* 78.4* 78.6*  PLT 353 326 280 339   Recent Labs  Lab 12/29/20 1123 12/25/2020 1726 01/02/21 0434 01/03/21 0253  NA 133* 131* 134* 136  K 4.5 3.5 3.7 4.0  CL 95* 98 102 103  CO2 17* 20* 21* 23  GLUCOSE 97 152* 201* 176*  BUN 36* 38* 33* 37*  CREATININE 1.57* 1.27* 1.02 1.11  CALCIUM 9.0 8.5* 8.1* 8.0*    F/u labs  Unresulted Labs (From admission, onward)          Start     Ordered   01/02/21 0500  CBC with Differential/Platelet  Daily,   R      01/13/2021 1837   01/02/21 0500  Comprehensive metabolic panel  Daily,   R      01/13/2021 1837   01/02/21 0500  C-reactive protein  Daily,   R      12/31/2020 1837          Signed, Terrilee Croak, MD [facility] 01/03/2021

## 2021-01-04 ENCOUNTER — Other Ambulatory Visit: Payer: Self-pay | Admitting: Internal Medicine

## 2021-01-04 DIAGNOSIS — M1A09X0 Idiopathic chronic gout, multiple sites, without tophus (tophi): Secondary | ICD-10-CM

## 2021-01-04 DIAGNOSIS — J069 Acute upper respiratory infection, unspecified: Secondary | ICD-10-CM | POA: Diagnosis not present

## 2021-01-04 DIAGNOSIS — U071 COVID-19: Secondary | ICD-10-CM | POA: Diagnosis not present

## 2021-01-04 LAB — COMPREHENSIVE METABOLIC PANEL
ALT: 49 U/L — ABNORMAL HIGH (ref 0–44)
AST: 60 U/L — ABNORMAL HIGH (ref 15–41)
Albumin: 3.1 g/dL — ABNORMAL LOW (ref 3.5–5.0)
Alkaline Phosphatase: 61 U/L (ref 38–126)
Anion gap: 9 (ref 5–15)
BUN: 34 mg/dL — ABNORMAL HIGH (ref 8–23)
CO2: 24 mmol/L (ref 22–32)
Calcium: 8 mg/dL — ABNORMAL LOW (ref 8.9–10.3)
Chloride: 105 mmol/L (ref 98–111)
Creatinine, Ser: 1.02 mg/dL (ref 0.61–1.24)
GFR, Estimated: >60 mL/min (ref >60)
Glucose, Bld: 171 mg/dL — ABNORMAL HIGH (ref 70–99)
Potassium: 4.1 mmol/L (ref 3.5–5.1)
Sodium: 138 mmol/L (ref 135–145)
Total Bilirubin: 0.7 mg/dL (ref 0.3–1.2)
Total Protein: 5.8 g/dL — ABNORMAL LOW (ref 6.5–8.1)

## 2021-01-04 LAB — CBC WITH DIFFERENTIAL/PLATELET
Abs Immature Granulocytes: 0.07 10*3/uL (ref 0.00–0.07)
Basophils Absolute: 0 10*3/uL (ref 0.0–0.1)
Basophils Relative: 0 %
Eosinophils Absolute: 0 10*3/uL (ref 0.0–0.5)
Eosinophils Relative: 0 %
HCT: 43.8 % (ref 39.0–52.0)
Hemoglobin: 14.1 g/dL (ref 13.0–17.0)
Immature Granulocytes: 1 %
Lymphocytes Relative: 5 %
Lymphs Abs: 0.5 10*3/uL — ABNORMAL LOW (ref 0.7–4.0)
MCH: 25.5 pg — ABNORMAL LOW (ref 26.0–34.0)
MCHC: 32.2 g/dL (ref 30.0–36.0)
MCV: 79.1 fL — ABNORMAL LOW (ref 80.0–100.0)
Monocytes Absolute: 0.6 10*3/uL (ref 0.1–1.0)
Monocytes Relative: 6 %
Neutro Abs: 9.6 10*3/uL — ABNORMAL HIGH (ref 1.7–7.7)
Neutrophils Relative %: 88 %
Platelets: 354 10*3/uL (ref 150–400)
RBC: 5.54 MIL/uL (ref 4.22–5.81)
RDW: 15.1 % (ref 11.5–15.5)
WBC: 10.9 10*3/uL — ABNORMAL HIGH (ref 4.0–10.5)
nRBC: 0 % (ref 0.0–0.2)

## 2021-01-04 LAB — BLOOD GAS, ARTERIAL
Acid-base deficit: 1.9 mmol/L (ref 0.0–2.0)
Bicarbonate: 21.4 mmol/L (ref 20.0–28.0)
O2 Saturation: 91.8 %
Patient temperature: 98.6
pCO2 arterial: 34.1 mmHg (ref 32.0–48.0)
pH, Arterial: 7.414 (ref 7.350–7.450)
pO2, Arterial: 65.8 mmHg — ABNORMAL LOW (ref 83.0–108.0)

## 2021-01-04 LAB — GLUCOSE, CAPILLARY
Glucose-Capillary: 147 mg/dL — ABNORMAL HIGH (ref 70–99)
Glucose-Capillary: 263 mg/dL — ABNORMAL HIGH (ref 70–99)
Glucose-Capillary: 216 mg/dL — ABNORMAL HIGH (ref 70–99)
Glucose-Capillary: 165 mg/dL — ABNORMAL HIGH (ref 70–99)

## 2021-01-04 LAB — C-REACTIVE PROTEIN: CRP: 1 mg/dL — ABNORMAL HIGH (ref <1.0)

## 2021-01-04 MED ORDER — ONDANSETRON HCL 4 MG/2ML IJ SOLN
4.0000 mg | INTRAMUSCULAR | Status: DC | PRN
  Administered 2021-01-04: 4 mg via INTRAVENOUS
  Filled 2021-01-04: qty 2

## 2021-01-04 MED ORDER — ACETAMINOPHEN 325 MG PO TABS
650.0000 mg | ORAL_TABLET | Freq: Four times a day (QID) | ORAL | Status: DC | PRN
  Administered 2021-01-04: 650 mg via ORAL
  Filled 2021-01-04: qty 2

## 2021-01-04 MED ORDER — ONDANSETRON HCL 4 MG/2ML IJ SOLN
4.0000 mg | Freq: Four times a day (QID) | INTRAMUSCULAR | Status: DC | PRN
  Administered 2021-01-04: 4 mg via INTRAVENOUS
  Filled 2021-01-04: qty 2

## 2021-01-04 MED ORDER — LINAGLIPTIN 5 MG PO TABS
5.0000 mg | ORAL_TABLET | Freq: Every day | ORAL | Status: DC
  Administered 2021-01-04: 5 mg via ORAL
  Filled 2021-01-04: qty 1

## 2021-01-04 MED ORDER — ALUM & MAG HYDROXIDE-SIMETH 200-200-20 MG/5ML PO SUSP: 15.0000 mL | Freq: Four times a day (QID) | ORAL | Status: DC | PRN

## 2021-01-04 MED ORDER — PROCHLORPERAZINE EDISYLATE 10 MG/2ML IJ SOLN
10.0000 mg | Freq: Once | INTRAMUSCULAR | Status: AC
  Administered 2021-01-04: 10 mg via INTRAVENOUS
  Filled 2021-01-04: qty 2

## 2021-01-04 MED ORDER — PANTOPRAZOLE SODIUM 40 MG IV SOLR
40.0000 mg | Freq: Two times a day (BID) | INTRAVENOUS | Status: DC
  Administered 2021-01-04 – 2021-01-09 (×11): 40 mg via INTRAVENOUS
  Filled 2021-01-04 (×11): qty 40

## 2021-01-04 NOTE — Progress Notes (Signed)
Patient ambulatory to bathroom, dropped down to 80% on 8 liters HFNC.

## 2021-01-04 NOTE — Care Management Important Message (Signed)
Important Message  Patient Details  IM Letter placed in Patient's door Caddy. Name: Thomas Chung MRN: [id number] Date of Birth: [birth year]   Medicare Important Message Given:  Yes     Kerin Salen 01/04/2021, 2:33 PM

## 2021-01-04 NOTE — Progress Notes (Signed)
PROGRESS NOTE  [name]  DOB: [birth year]  PCP: Janith Lima, MD RSW:[id number]  DOA: 01/12/2021  LOS: 3 days   Chief Complaint  Patient presents with  . Shortness of Breath  . Weakness  . Diarrhea   Brief narrative: Thomas Chung is a 80 y.o. male with PMH significant for T2DM, HTN, HLD, peripheral neuropathy, pancreatic insufficiency, GERD . Patient presented to the ED on 2/12 with progressive weakness and diarrhea.  Symptoms started 2 weeks ago with weakness, cough, intermittent fever.  He tested positive for Covid on February second as an outpatient.  He was followed by a post Covid care clinic and treated conservatively.  He continues to have diarrhea, low oral intake and generalized weakness. Oxygen saturation was around 90% with pulse ox at home. Unvaccinated for COVID-19.  In the ED, patient was afebrile, heart rate 106, O2 sat 85% on room air, initially required 2 L and later 5 L by nasal cannula. Labs showed sodium low at 131, creatinine elevated 1.27, CRP elevated to 5, procalcitonin normal, lactic acid normal at 1.4. Chest x-ray showed worsening moderate patchy opacities throughout the peripheral mid to lower lungs bilaterally, compatible with COVID-19 pneumonia. Patient was admitted to hospitalist service for COVID pneumonia  Subjective: Patient was seen and examined this morning. Sitting up in chair.  Feels better.  On 5 L oxygen by nasal cannula. Diarrhea improved.  Flexi-Seal was removed. Foley catheter remains in place.  Assessment/Plan: COVID pneumonia Acute respiratory failure with hypoxia  -Presented with worsening Covid symptoms -COVID test: PCR positive for first time on February 2 -Chest imaging: Chest x-ray at admission showed worsening moderate patchy opacities throughout the peripheral mid to lower lungs bilaterally -Treatment: Patient is currently on a 5-day course of IV remdesivir till 2/16, Solu-Medrol 60 mg twice daily.  Continue the  same.  Oxygen requirement at 5 L/min today at rest. -Oxygen - SpO2: (!) 86 % O2 Flow Rate (L/min): 5 L/min -Supportive care: Vitamin C, Zinc, PRN inhalers, Tylenol, Antitussives (benzonatate/ Mucinex/Tussionex).   -Encouraged incentive spirometry, prone position, out of bed and early mobilization as much as possible -Continue airborne/contact isolation precautions for duration of 3 weeks from the day of diagnosis. -WBC and inflammatory markers trend as below.  Recent Labs  Lab 12/29/20 1123 12/29/2020 1655 01/16/2021 1725 12/27/2020 1726 01/02/21 0434 01/03/21 0253 01/04/21 0421  SARSCOV2NAA  --  POSITIVE*  --   --   --   --   --   WBC 6.6  --   --  8.3 3.8* 6.8 10.9*  LATICACIDVEN  --   --  1.4  --   --   --   --   PROCALCITON  --   --   --  <0.10  --   --   --   DDIMER  --   --   --  0.63*  --   --   --   FERRITIN  --   --   --  221  --   --   --   LDH  --   --   --  289*  --   --   --   CRP  --   --   --  5.0* 6.8* 3.3* 1.0*  ALT 26  --   --  34 32 41 49*   The treatment plan and use of medications and known side effects were discussed with patient/family. Some of the medications used are based on case reports/anecdotal  data.  All other medications being used in the management of COVID-19 based on limited study data.  Complete risks and long-term side effects are unknown, however in the best clinical judgment they seem to be of some benefit.  Patient wanted to proceed with treatment options provided.  Persistent diarrhea Pancreatic insufficiency -Probably related to Covid itself.  Diarrhea improved.  Flexi-Seal removed. -Continue Creon, vitamin B12. -Okay to stop IV fluid.  Acute urinary retention -2/13 on bladder scan, patient was noted to have significant urinary retention.  Foley catheter was inserted which drained 2.4 L of urine.  Keep Foley catheter in.  Voiding trial in the next several days probably as an outpatient.  Patient denies any history of BPH or LUTS prior to this  illness.  CKD 3a -Baseline creatinine 1.33 from December 2021.  Creatinine remains stable at this presentation. Recent Labs    02/03/20 1356 08/05/20 1345 11/04/20 1429 12/29/20 1123 01/16/2021 1726 01/02/21 0434 01/03/21 0253 01/04/21 0421  BUN 35* 28* 28* 36* 38* 33* 37* 34*  CREATININE 1.18 1.20* 1.33 1.57* 1.27* 1.02 1.11 1.02   Type 2 diabetes mellitus Hyperglycemia -Diet controlled.  A1c 6.7 on 12/16. -Blood sugar level is running high currently because of steroids.  Continue sliding scale insulin with Accu-Cheks. -Add Tradjenta 5 mg daily.  May need long-acting insulin if blood glucose continues to remain high. Recent Labs  Lab 01/03/21 1127 01/03/21 1628 01/03/21 2009 01/04/21 0739 01/04/21 1123  GLUCAP 219* 217* 246* 147* 263*   Essential hypertension -Home meds include metoprolol succinate 25 mg daily, chlorthalidone 25 mg daily, olmesartan 40 mg daily.   -Continue metoprolol.  Chlorthalidone and olmesartan on hold.    Hyperlipidemia Continue statin  Mobility: Encourage ambulation.  PT evaluation pending Code Status:   Code Status: Full Code  Nutritional status: Body mass index is 31.21 kg/m.     Diet Order            Diet Heart Room service appropriate? Yes; Fluid consistency: Thin  Diet effective now                 DVT prophylaxis: Lovenox subcu   Antimicrobials:  None Fluid: Okay to stop IV fluid Consultants: none Family Communication: none at bedside  Status is: Inpatient  Remains inpatient appropriate because -needs IV hydration, treatment for Covid  Dispo: The patient is from: Home              Anticipated d/c is to: Home              Anticipated d/c date is: > 3 days              Patient currently is not medically stable to d/c.   Difficult to place patient No  Infusions:  . sodium chloride    . remdesivir 100 mg in NS 100 mL 100 mg (01/04/21 0916)    Scheduled Meds: . allopurinol  100 mg Oral Daily  . atorvastatin  40  mg Oral Daily  . Chlorhexidine Gluconate Cloth  6 each Topical Daily  . enoxaparin (LOVENOX) injection  60 mg Subcutaneous Q24H  . insulin aspart  0-5 Units Subcutaneous QHS  . insulin aspart  0-9 Units Subcutaneous TID WC  . lipase/protease/amylase  36,000 Units Oral TID WC  . methylPREDNISolone (SOLU-MEDROL) injection  60 mg Intravenous Q12H  . metoprolol succinate  25 mg Oral Daily  . ondansetron (ZOFRAN) IV  4 mg Intravenous Once  . cyanocobalamin  1,000 mcg Oral  Daily    Antimicrobials: Anti-infectives (From admission, onward)   Start     Dose/Rate Route Frequency Ordered Stop   01/02/21 1000  remdesivir 100 mg in sodium chloride 0.9 % 100 mL IVPB       "Followed by" Linked Group Details   100 mg 200 mL/hr over 30 Minutes Intravenous Daily 12/23/2020 1845 01/06/21 0959   12/25/2020 2000  remdesivir 200 mg in sodium chloride 0.9% 250 mL IVPB       "Followed by" Linked Group Details   200 mg 580 mL/hr over 30 Minutes Intravenous Once 01/08/2021 1845 01/16/2021 2017      PRN meds: benzonatate   Objective: Vitals:   01/04/21 0630 01/04/21 0828  BP: 134/78   Pulse: 76   Resp:    Temp: 98.6 F (37 C)   SpO2: 98% (!) 86%    Intake/Output Summary (Last 24 hours) at 01/04/2021 1319 Last data filed at 01/04/2021 1000 Gross per 24 hour  Intake 600 ml  Output 3325 ml  Net -2725 ml   Filed Weights   01/16/2021 2125 12/30/2020 2306  Weight: 116.3 kg 116.3 kg   Weight change:  Body mass index is 31.21 kg/m.   Physical Exam: General exam: Pleasant elderly Caucasian male.  Gradually feeling better but weakness persists Skin: No rashes, lesions or ulcers. HEENT: Atraumatic, normocephalic, no obvious bleeding Lungs: Clear to auscultation bilaterally CVS: Regular rate and rhythm, no murmur GI/Abd soft, nontender, nondistended, bowel sound present CNS: Alert, awake, oriented x3 Psychiatry: Mood appropriate Extremities: No pedal edema, no calf tenderness  Data Review: I have  personally reviewed the laboratory data and studies available.  Recent Labs  Lab 12/29/20 1123 01/04/2021 1726 01/02/21 0434 01/03/21 0253 01/04/21 0421  WBC 6.6 8.3 3.8* 6.8 10.9*  NEUTROABS  --  7.1 3.1 5.9 9.6*  HGB 14.8 14.7 13.8 13.8 14.1  HCT 44.9 43.9 42.4 42.3 43.8  MCV 78* 77.6* 78.4* 78.6* 79.1*  PLT 353 326 280 339 354   Recent Labs  Lab 12/29/20 1123 01/12/2021 1726 01/02/21 0434 01/03/21 0253 01/04/21 0421  NA 133* 131* 134* 136 138  K 4.5 3.5 3.7 4.0 4.1  CL 95* 98 102 103 105  CO2 17* 20* 21* 23 24  GLUCOSE 97 152* 201* 176* 171*  BUN 36* 38* 33* 37* 34*  CREATININE 1.57* 1.27* 1.02 1.11 1.02  CALCIUM 9.0 8.5* 8.1* 8.0* 8.0*    F/u labs  Unresulted Labs (From admission, onward)          Start     Ordered   01/02/21 0500  CBC with Differential/Platelet  Daily,   R      01/01/2021 1837   01/02/21 0500  Comprehensive metabolic panel  Daily,   R      01/06/2021 1837   01/02/21 0500  C-reactive protein  Daily,   R      12/31/2020 1837          Signed, Terrilee Croak, MD [facility] 01/04/2021

## 2021-01-04 NOTE — Plan of Care (Signed)
  Problem: Education: Goal: Knowledge of risk factors and measures for prevention of condition will improve Outcome: Progressing   Problem: Coping: Goal: Psychosocial and spiritual needs will be supported Outcome: Progressing   Problem: Respiratory: Goal: Will maintain a patent airway Outcome: Progressing Goal: Complications related to the disease process, condition or treatment will be avoided or minimized Outcome: Progressing   

## 2021-01-04 NOTE — Plan of Care (Signed)
Patient up to chair for much of shift, walked to bathroom on 8 liters to have BM (flexiseal removed early in shift). Patient began having nausea after lunch that was improved with Zofran but then began to c/o abdominal pain.  MD notified, Tylenol and Protonix given.  Patient requiring more oxygen, up to 9 liters to maintain sats in the high 80's to low 90's.

## 2021-01-04 NOTE — Evaluation (Signed)
Physical Therapy Evaluation Patient Details Name: Thomas Chung MRN: [id number] DOB: [birth year] Today's Date: 01/04/2021   History of Present Illness  80 y.o. male with PMH significant for T2DM, HTN, HLD, peripheral neuropathy, pancreatic insufficiency, GERD .  Patient presented to the ED on 2/12 with progressive weakness and diarrhea. Pt tested positive for covid 12/22/20. Dx of covid PNA, pancreatic insufficiency, urinary retention.  Clinical Impression  Pt admitted with above diagnosis. Pt ambulated 18' x 2 with ~5 minute seated rest break. SaO2 78% on 6L O2 walking, 84% on 8L HFNC walking, 95% on 6L O2 at rest. Activity tolerance limited by SOB and "feeling weak". Instructed pt in pursed lip breathing technique, reviewed use of incentive spirometer, encouraged proning, and instructed pt in BUE/LE exercises to be done independently. Pt is active at baseline, he goes to tai chi 5x/week. Good progress expected.  Pt currently with functional limitations due to the deficits listed below (see PT Problem List). Pt will benefit from skilled PT to increase their independence and safety with mobility to allow discharge to the venue listed below.       Follow Up Recommendations Home health PT    Equipment Recommendations  Rolling walker with 5" wheels;3in1 (PT)    Recommendations for Other Services       Precautions / Restrictions Precautions Precautions: Fall Precaution Comments: monitor O2 Restrictions Weight Bearing Restrictions: No      Mobility  Bed Mobility               General bed mobility comments: up in recliner    Transfers Overall transfer level: Needs assistance Equipment used: Rolling walker (2 wheeled) Transfers: Sit to/from Stand Sit to Stand: From elevated surface;Min assist         General transfer comment: VCs hand placement, increased time and effort, sit to stand x 2 trials  Ambulation/Gait Ambulation/Gait assistance: Min guard Gait Distance (Feet):  36 Feet (18' x 2 with seated rest) Assistive device: Rolling walker (2 wheeled) Gait Pattern/deviations: Step-through pattern;Decreased stride length Gait velocity: decr   General Gait Details: VCs for pursed lip breathing, distance limited by fatigue/SOB, 18' x 2 with seated rest of ~6 minutes, no loss of balance, SaO2 78% on 6L O2 walking, 84% on 8L HFNC walking, 95% on 6L HFNC at rest  Stairs            Wheelchair Mobility    Modified Rankin (Stroke Patients Only)       Balance Overall balance assessment: Modified Independent                                           Pertinent Vitals/Pain Pain Assessment: No/denies pain    Home Living Family/patient expects to be discharged to:: Private residence Living Arrangements: Spouse/significant other Available Help at Discharge: Family Type of Home: Apartment Home Access: Level entry     Home Layout: One level Home Equipment: None      Prior Function Level of Independence: Independent         Comments: active, goes to tai chi 5x/week, and to balance classes, ushers at [employer]        Extremity/Trunk Assessment   Upper Extremity Assessment Upper Extremity Assessment: Overall WFL for tasks assessed    Lower Extremity Assessment Lower Extremity Assessment: Overall WFL for tasks assessed    Cervical /  Trunk Assessment Cervical / Trunk Assessment: Normal  Communication   Communication: HOH  Cognition Arousal/Alertness: Awake/alert Behavior During Therapy: WFL for tasks assessed/performed Overall Cognitive Status: Within Functional Limits for tasks assessed                                        General Comments      Exercises General Exercises - Lower Extremity Ankle Circles/Pumps: AROM;Both;10 reps;Seated Long Arc Quad: AROM;Both;10 reps;Seated Hip Flexion/Marching: AROM;Both;10 reps;Seated   Assessment/Plan    PT Assessment Patient needs  continued PT services  PT Problem List Decreased activity tolerance;Cardiopulmonary status limiting activity;Decreased mobility       PT Treatment Interventions Gait training;Functional mobility training;Therapeutic activities;Therapeutic exercise;Patient/family education    PT Goals (Current goals can be found in the Care Plan section)  Acute Rehab PT Goals Patient Stated Goal: return to doing tai chi 5x/week PT Goal Formulation: With patient Time For Goal Achievement: 01/18/21 Potential to Achieve Goals: Good    Frequency Min 3X/week   Barriers to discharge        Co-evaluation               AM-PAC PT "6 Clicks" Mobility  Outcome Measure Help needed turning from your back to your side while in a flat bed without using bedrails?: None Help needed moving from lying on your back to sitting on the side of a flat bed without using bedrails?: None Help needed moving to and from a bed to a chair (including a wheelchair)?: A Little Help needed standing up from a chair using your arms (e.g., wheelchair or bedside chair)?: A Little Help needed to walk in hospital room?: A Little Help needed climbing 3-5 steps with a railing? : A Little 6 Click Score: 20    End of Session Equipment Utilized During Treatment: Oxygen Activity Tolerance: Patient limited by fatigue Patient left: in chair;with call bell/phone within reach Nurse Communication: Mobility status PT Visit Diagnosis: Difficulty in walking, not elsewhere classified (R26.2)    Time: 1127-1205 PT Time Calculation (min) (ACUTE ONLY): 38 min   Charges:   PT Evaluation $PT Eval Low Complexity: 1 Low PT Treatments $Gait Training: 8-22 mins $Therapeutic Exercise: 8-22 mins       Blondell Reveal Kistler PT 01/04/2021  Acute Rehabilitation Services Pager [phone] Office [phone]

## 2021-01-05 ENCOUNTER — Inpatient Hospital Stay (HOSPITAL_COMMUNITY): Payer: Medicare HMO

## 2021-01-05 DIAGNOSIS — J069 Acute upper respiratory infection, unspecified: Secondary | ICD-10-CM | POA: Diagnosis not present

## 2021-01-05 DIAGNOSIS — U071 COVID-19: Secondary | ICD-10-CM | POA: Diagnosis not present

## 2021-01-05 LAB — CBC WITH DIFFERENTIAL/PLATELET
Abs Immature Granulocytes: 0.58 10*3/uL — ABNORMAL HIGH (ref 0.00–0.07)
Basophils Absolute: 0.1 10*3/uL (ref 0.0–0.1)
Basophils Relative: 0 %
Eosinophils Absolute: 0.6 10*3/uL — ABNORMAL HIGH (ref 0.0–0.5)
Eosinophils Relative: 2 %
HCT: 46.4 % (ref 39.0–52.0)
Hemoglobin: 15.1 g/dL (ref 13.0–17.0)
Immature Granulocytes: 2 %
Lymphocytes Relative: 2 %
Lymphs Abs: 0.5 10*3/uL — ABNORMAL LOW (ref 0.7–4.0)
MCH: 25.4 pg — ABNORMAL LOW (ref 26.0–34.0)
MCHC: 32.5 g/dL (ref 30.0–36.0)
MCV: 78 fL — ABNORMAL LOW (ref 80.0–100.0)
Monocytes Absolute: 2.5 10*3/uL — ABNORMAL HIGH (ref 0.1–1.0)
Monocytes Relative: 9 %
Neutro Abs: 21.9 10*3/uL — ABNORMAL HIGH (ref 1.7–7.7)
Neutrophils Relative %: 85 %
Platelets: 290 10*3/uL (ref 150–400)
RBC: 5.95 MIL/uL — ABNORMAL HIGH (ref 4.22–5.81)
RDW: 15.5 % (ref 11.5–15.5)
WBC: 26.1 10*3/uL — ABNORMAL HIGH (ref 4.0–10.5)
nRBC: 0 % (ref 0.0–0.2)

## 2021-01-05 LAB — GLUCOSE, CAPILLARY
Glucose-Capillary: 169 mg/dL — ABNORMAL HIGH (ref 70–99)
Glucose-Capillary: 225 mg/dL — ABNORMAL HIGH (ref 70–99)
Glucose-Capillary: 236 mg/dL — ABNORMAL HIGH (ref 70–99)
Glucose-Capillary: 246 mg/dL — ABNORMAL HIGH (ref 70–99)
Glucose-Capillary: 254 mg/dL — ABNORMAL HIGH (ref 70–99)

## 2021-01-05 LAB — COMPREHENSIVE METABOLIC PANEL
ALT: 62 U/L — ABNORMAL HIGH (ref 0–44)
AST: 78 U/L — ABNORMAL HIGH (ref 15–41)
Albumin: 3.3 g/dL — ABNORMAL LOW (ref 3.5–5.0)
Alkaline Phosphatase: 118 U/L (ref 38–126)
Anion gap: 11 (ref 5–15)
BUN: 35 mg/dL — ABNORMAL HIGH (ref 8–23)
CO2: 22 mmol/L (ref 22–32)
Calcium: 8.1 mg/dL — ABNORMAL LOW (ref 8.9–10.3)
Chloride: 105 mmol/L (ref 98–111)
Creatinine, Ser: 1.08 mg/dL (ref 0.61–1.24)
GFR, Estimated: >60 mL/min (ref >60)
Glucose, Bld: 192 mg/dL — ABNORMAL HIGH (ref 70–99)
Potassium: 4.1 mmol/L (ref 3.5–5.1)
Sodium: 138 mmol/L (ref 135–145)
Total Bilirubin: 1 mg/dL (ref 0.3–1.2)
Total Protein: 6.2 g/dL — ABNORMAL LOW (ref 6.5–8.1)

## 2021-01-05 LAB — MRSA PCR SCREENING: MRSA by PCR: NEGATIVE

## 2021-01-05 LAB — C-REACTIVE PROTEIN: CRP: 3.6 mg/dL — ABNORMAL HIGH (ref <1.0)

## 2021-01-05 MED ORDER — IPRATROPIUM-ALBUTEROL 20-100 MCG/ACT IN AERS
1.0000 | INHALATION_SPRAY | Freq: Four times a day (QID) | RESPIRATORY_TRACT | Status: DC
  Administered 2021-01-05 – 2021-01-06 (×5): 1 via RESPIRATORY_TRACT
  Filled 2021-01-05: qty 4

## 2021-01-05 MED ORDER — ORAL CARE MOUTH RINSE
15.0000 mL | Freq: Two times a day (BID) | OROMUCOSAL | Status: DC
  Administered 2021-01-05 – 2021-01-06 (×3): 15 mL via OROMUCOSAL

## 2021-01-05 MED ORDER — HYDRALAZINE HCL 20 MG/ML IJ SOLN
10.0000 mg | Freq: Four times a day (QID) | INTRAMUSCULAR | Status: DC | PRN
  Administered 2021-01-05 – 2021-01-12 (×7): 10 mg via INTRAVENOUS
  Filled 2021-01-05 (×6): qty 1

## 2021-01-05 MED ORDER — NICARDIPINE HCL IN NACL 20-0.86 MG/200ML-% IV SOLN
3.0000 mg/h | INTRAVENOUS | Status: DC
  Administered 2021-01-05: 5 mg/h via INTRAVENOUS
  Administered 2021-01-05: 2 mg/h via INTRAVENOUS
  Administered 2021-01-05: 5 mg/h via INTRAVENOUS
  Administered 2021-01-06: 10 mg/h via INTRAVENOUS
  Administered 2021-01-06 (×2): 12.5 mg/h via INTRAVENOUS
  Administered 2021-01-06: 10 mg/h via INTRAVENOUS
  Filled 2021-01-05 (×17): qty 200

## 2021-01-05 MED ORDER — METOPROLOL TARTRATE 5 MG/5ML IV SOLN
2.5000 mg | Freq: Four times a day (QID) | INTRAVENOUS | Status: DC | PRN
  Administered 2021-01-05: 2.5 mg via INTRAVENOUS
  Filled 2021-01-05: qty 5

## 2021-01-05 MED ORDER — LABETALOL HCL 5 MG/ML IV SOLN
10.0000 mg | INTRAVENOUS | Status: DC | PRN
  Administered 2021-01-05 – 2021-01-12 (×7): 10 mg via INTRAVENOUS
  Filled 2021-01-05 (×6): qty 4

## 2021-01-05 MED ORDER — SODIUM CHLORIDE 0.9 % IV SOLN: INTRAVENOUS | Status: DC

## 2021-01-05 MED ORDER — INSULIN ASPART 100 UNIT/ML ~~LOC~~ SOLN: 0.0000 [IU] | SUBCUTANEOUS | Status: DC

## 2021-01-05 MED ORDER — TOCILIZUMAB 400 MG/20ML IV SOLN
800.0000 mg | Freq: Once | INTRAVENOUS | Status: AC
  Administered 2021-01-05: 800 mg via INTRAVENOUS
  Filled 2021-01-05: qty 40

## 2021-01-05 MED ORDER — MORPHINE SULFATE (PF) 2 MG/ML IV SOLN: 2.0000 mg | INTRAVENOUS | Status: DC | PRN

## 2021-01-05 MED ORDER — INSULIN ASPART 100 UNIT/ML ~~LOC~~ SOLN
0.0000 [IU] | SUBCUTANEOUS | Status: DC
  Administered 2021-01-05: 5 [IU] via SUBCUTANEOUS
  Administered 2021-01-05 – 2021-01-06 (×3): 3 [IU] via SUBCUTANEOUS
  Administered 2021-01-06 (×3): 5 [IU] via SUBCUTANEOUS
  Administered 2021-01-06: 3 [IU] via SUBCUTANEOUS
  Administered 2021-01-07: 7 [IU] via SUBCUTANEOUS
  Administered 2021-01-07 (×3): 5 [IU] via SUBCUTANEOUS
  Administered 2021-01-07 (×2): 7 [IU] via SUBCUTANEOUS
  Administered 2021-01-08: 5 [IU] via SUBCUTANEOUS
  Administered 2021-01-08: 7 [IU] via SUBCUTANEOUS
  Administered 2021-01-08 (×2): 5 [IU] via SUBCUTANEOUS

## 2021-01-05 NOTE — Progress Notes (Addendum)
Patient's BP remains significantly elevated; SBP 196 after PRN labetalol given. Pt denies chest pain, and heart rhythm remains in NSR. Dr Pietro Cassis paged by this RN; orders received to begin cardene gtt. This RN will continue to carefully monitor patient.

## 2021-01-05 NOTE — Progress Notes (Signed)
Patient's BP has remained elevated despite PRN hydralazine and metoprolol. SBP currently 190. Dr. Pietro Cassis notified by RN; orders received for PRN labetalol. This RN will continue to carefully monitor pt.

## 2021-01-05 NOTE — Progress Notes (Signed)
PROGRESS NOTE  Thomas Chung  DOB: 1941/03/30  PCP: Thomas Lima, MD WVP:[id number]  DOA: 12/21/2020  LOS: 4 days   Chief Complaint  Patient presents with  . Shortness of Breath  . Weakness  . Diarrhea   Brief narrative: Thomas Chung is a 80 y.o. male with PMH significant for T2DM, HTN, HLD, peripheral neuropathy, pancreatic insufficiency, GERD . Patient presented to the ED on 2/12 with progressive weakness and diarrhea.  Symptoms started 2 weeks ago with weakness, cough, intermittent fever.   2/2, he tested positive for Covid on as an outpatient.  He was followed by a post Covid care clinic and treated conservatively.  He continued to have diarrhea, low oral intake and generalized weakness. Oxygen saturation was around 90% with pulse ox at home. Unvaccinated for COVID-19. Presented to ED with worsening symptoms  In the ED, required 5 L oxygen.  Inflammatory markers were elevated. Chest x-ray showed worsening moderate patchy opacities throughout the peripheral mid to lower lungs bilaterally, compatible with COVID-19 pneumonia. Patient was admitted to hospitalist service for COVID pneumonia  Patient started gradually improving but overnight on 2/15, patient started to desat and required higher flow of oxygen.  He started complaining abdominal pain as well.  Abdominal x-ray showed small bowel obstruction. 2/16, 1 dose of Actemra was given.  Subjective: Patient was seen and examined this morning. Clearly in respiratory distress.  Started on heated high flow nasal cannula this morning requiring 25 L/min. Also has distended bowel with sluggish bowel sound Foley catheter with minimal concentrated urine  Assessment/Plan: COVID pneumonia Acute respiratory failure with hypoxia  -Presented with worsening Covid symptoms -COVID test: PCR positive for first time on February 2 -Chest imaging: Chest x-ray at admission showed worsening moderate patchy opacities throughout the  peripheral mid to lower lungs bilaterally -Patient was initially treated with IV remdesivir and IV steroids.  He was gradually improving but in last 24 hours, he has had significant setback.  He completes 5-day course of IV remdesivir today 2/16.  I will continue him on Solu-Medrol 60 mg IV twice daily.  This morning, I had a long conversation with him and his wife on the phone.  Based on his significantly worsening hypoxia, uptrending CRP, he is a candidate for Actemra.  Consent obtained verbally.   -We will transfer him to stepdown level of care -Oxygen - SpO2: 94 % O2 Flow Rate (L/min): 25 L/min FiO2 (%): 100 % -Supportive care: Vitamin C, Zinc, PRN inhalers, Tylenol, Antitussives (benzonatate/ Mucinex/Tussionex).   -Encouraged incentive spirometry, prone position, out of bed and early mobilization as much as possible -Continue airborne/contact isolation precautions for duration of 3 weeks from the day of diagnosis. -WBC and inflammatory markers trend as below.  Recent Labs  Lab 01/03/2021 1655 01/02/2021 1725 01/14/2021 1726 01/02/21 0434 01/03/21 0253 01/04/21 0421 01/05/21 0438  SARSCOV2NAA POSITIVE*  --   --   --   --   --   --   WBC  --   --  8.3 3.8* 6.8 10.9* 26.1*  LATICACIDVEN  --  1.4  --   --   --   --   --   PROCALCITON  --   --  <0.10  --   --   --   --   DDIMER  --   --  0.63*  --   --   --   --   FERRITIN  --   --  221  --   --   --   --  LDH  --   --  289*  --   --   --   --   CRP  --   --  5.0* 6.8* 3.3* 1.0* 3.6*  ALT  --   --  34 32 41 49* 62*   The treatment plan and use of medications and known side effects were discussed with patient/family. Some of the medications used are based on case reports/anecdotal data.  All other medications being used in the management of COVID-19 based on limited study data.  Complete risks and long-term side effects are unknown, however in the best clinical judgment they seem to be of some benefit.  Patient wanted to proceed with  treatment options provided.  Small bowel obstruction -On initial presentation, patient had diarrhea because of which Flexi-Seal was inserted. Diarrhea improved quickly and the catheter was removed.  He did not have any bowel movement after that -For the last 24 hours, patient is having abdominal pain.   -Abdominal x-ray from last night shows distended bowel loops suggestive of small bowel obstruction -Start on conservative management with n.p.o. status, IV fluid at 75 mL/h, IV morphine, IV Zofran. -General surgery consulted as well.  Elevated liver enzymes -Mildly elevated liver enzymes.  I do not think this would contraindicate improved with Actemra however.  Continue to monitor Recent Labs  Lab 12/25/2020 1726 01/02/21 0434 01/03/21 0253 01/04/21 0421 01/05/21 0438  AST 52* 47* 54* 60* 78*  ALT 34 32 41 49* 62*  ALKPHOS 55 51 50 61 118  BILITOT 1.0 0.8 0.8 0.7 1.0  PROT 6.8 6.3* 5.9* 5.8* 6.2*  ALBUMIN 3.8 3.3* 3.1* 3.1* 3.3*   Pancreatic insufficiency -Prior to admission he was on Creon, vitamin B12. -Okay to hold oral meds because of SBO  Acute urinary retention -2/13 on bladder scan, patient was noted to have significant urinary retention.  Foley catheter was inserted which drained 2.4 L of urine.  Keep Foley catheter in.  Voiding trial in the next several days probably as an outpatient.  Patient denies any history of BPH or LUTS prior to this illness.  CKD 3a -Baseline creatinine 1.33 from December 2021.  Creatinine remains stable at this presentation. Recent Labs    02/03/20 1356 08/05/20 1345 11/04/20 1429 12/29/20 1123 12/22/2020 1726 01/02/21 0434 01/03/21 0253 01/04/21 0421 01/05/21 0438  BUN 35* 28* 28* 36* 38* 33* 37* 34* 35*  CREATININE 1.18 1.20* 1.33 1.57* 1.27* 1.02 1.11 1.02 1.08   Type 2 diabetes mellitus Hyperglycemia -Diet controlled.  A1c 6.7 on 12/16. -Blood sugar level is running high currently because of steroids.  Continue sliding scale insulin  with Accu-Cheks. -Hold Tradjenta because of SBO.  Since she is n.p.o., I would avoid long-acting insulin at this time.  Recent Labs  Lab 01/04/21 0739 01/04/21 1123 01/04/21 1639 01/04/21 2051 01/05/21 0751  GLUCAP 147* 263* 216* 165* 169*   Essential hypertension -Home meds include metoprolol succinate 25 mg daily, chlorthalidone 25 mg daily, olmesartan 40 mg daily.   -Chlorthalidone and olmesartan on hold.  Switch metoprolol to IV as needed.  Also started on hydralazine IV as needed.  Hyperlipidemia -Statin on hold because of n.p.o. status  Mobility: Encourage ambulation.  PT evaluation obtained.  Recommendation was for home health PT.  May need reeval after clinical improvement Code Status:   Code Status: Full Code  Nutritional status: Body mass index is 31.21 kg/m.     Diet Order  Diet NPO time specified  Diet effective midnight                 DVT prophylaxis: Lovenox subcu   Antimicrobials:  None Fluid: Normal saline at 75 mill per hour Consultants: General surgery Family Communication: none at bedside  Status is: Inpatient  Remains inpatient appropriate because -worsening Covid pneumonia  Dispo: The patient is from: Home              Anticipated d/c is to: Home              Anticipated d/c date is: > 3 days              Patient currently is not medically stable to d/c.   Difficult to place patient No  Infusions:  . sodium chloride 75 mL/hr at 01/05/21 0849  . remdesivir 100 mg in NS 100 mL 100 mg (01/05/21 0851)  . tocilizumab (ACTEMRA) - non-COVID treatment      Scheduled Meds: . Chlorhexidine Gluconate Cloth  6 each Topical Daily  . enoxaparin (LOVENOX) injection  60 mg Subcutaneous Q24H  . insulin aspart  0-5 Units Subcutaneous QHS  . insulin aspart  0-9 Units Subcutaneous TID WC  . methylPREDNISolone (SOLU-MEDROL) injection  60 mg Intravenous Q12H  . ondansetron (ZOFRAN) IV  4 mg Intravenous Once  . pantoprazole (PROTONIX) IV  40  mg Intravenous Q12H    Antimicrobials: Anti-infectives (From admission, onward)   Start     Dose/Rate Route Frequency Ordered Stop   01/02/21 1000  remdesivir 100 mg in sodium chloride 0.9 % 100 mL IVPB       "Followed by" Linked Group Details   100 mg 200 mL/hr over 30 Minutes Intravenous Daily 12/24/2020 1845 01/06/21 0959   12/23/2020 2000  remdesivir 200 mg in sodium chloride 0.9% 250 mL IVPB       "Followed by" Linked Group Details   200 mg 580 mL/hr over 30 Minutes Intravenous Once 12/23/2020 1845 01/07/2021 2017      PRN meds: acetaminophen, alum & mag hydroxide-simeth, hydrALAZINE, metoprolol tartrate, ondansetron (ZOFRAN) IV   Objective: Vitals:   01/05/21 0740 01/05/21 0825  BP:  (!) 179/95  Pulse:  96  Resp: (!) 36 (!) 34  Temp:    SpO2: (!) 74% 94%    Intake/Output Summary (Last 24 hours) at 01/05/2021 0853 Last data filed at 01/05/2021 0600 Gross per 24 hour  Intake 850 ml  Output 900 ml  Net -50 ml   Filed Weights   12/31/2020 2125 12/31/2020 2306  Weight: 116.3 kg 116.3 kg   Weight change:  Body mass index is 31.21 kg/m.   Physical Exam: General exam: Pleasant elderly Caucasian male.  Significant setback in last 24 hours.  In respiratory distress Skin: No rashes, lesions or ulcers. HEENT: Atraumatic, normocephalic, no obvious bleeding Lungs: Tachypneic, bilateral fine scattered crackles CVS: Tachycardic, regular rhythm, no murmur GI/Abd soft, diffuse mild tenderness, distended, bowel sound sluggish CNS: Alert, awake, oriented x3 Psychiatry: Anxious because of respiratory distress this morning Extremities: No pedal edema, no calf tenderness  Data Review: I have personally reviewed the laboratory data and studies available.  Recent Labs  Lab 01/14/2021 1726 01/02/21 0434 01/03/21 0253 01/04/21 0421 01/05/21 0438  WBC 8.3 3.8* 6.8 10.9* 26.1*  NEUTROABS 7.1 3.1 5.9 9.6* 21.9*  HGB 14.7 13.8 13.8 14.1 15.1  HCT 43.9 42.4 42.3 43.8 46.4  MCV 77.6* 78.4*  78.6* 79.1* 78.0*  PLT 326 280 339 354  [place]  Lab 12/24/2020 1726 01/02/21 0434 01/03/21 0253 01/04/21 0421 01/05/21 0438  NA 131* 134* 136 138 138  K 3.5 3.7 4.0 4.1 4.1  CL 98 102 103 105 105  CO2 20* 21* 23 24 22   GLUCOSE 152* 201* 176* 171* 192*  BUN 38* 33* 37* 34* 35*  CREATININE 1.27* 1.02 1.11 1.02 1.08  CALCIUM 8.5* 8.1* 8.0* 8.0* 8.1*    F/u labs  Unresulted Labs (From admission, onward)          Start     Ordered   01/02/21 0500  CBC with Differential/Platelet  Daily,   R      12/25/2020 1837   01/02/21 0500  Comprehensive metabolic panel  Daily,   R      01/13/2021 1837   01/02/21 0500  C-reactive protein  Daily,   R      01/03/2021 1837          Signed, Terrilee Croak, MD [facility] 01/05/2021

## 2021-01-05 NOTE — Progress Notes (Signed)
   01/04/21 2125  Assess: MEWS Score  Temp 97.8 F (36.6 C)  BP (!) 178/93  Pulse Rate 68  Resp (!) 27  SpO2 93 %  O2 Device Non-rebreather Mask;HFNC  Patient Activity (if Appropriate) In bed  O2 Flow Rate (L/min) 15 L/min  Assess: MEWS Score  MEWS Temp 0  MEWS Systolic 0  MEWS Pulse 0  MEWS RR 2  MEWS LOC 0  MEWS Score 2  MEWS Score Color Yellow  Assess: if the MEWS score is Yellow or Red  Were vital signs taken at a resting state? Yes  Focused Assessment Change from prior assessment (see assessment flowsheet)  Early Detection of Sepsis Score *See Row Information* Low  MEWS guidelines implemented *See Row Information* Yes  Treat  MEWS Interventions Escalated (See documentation below)  Complains of Nausea /  Vomiting;Constipation;Other (Comment) (stomach cramps)  Interventions Heat;Relaxation  Take Vital Signs  Increase Vital Sign Frequency  Yellow: Q 2hr X 2 then Q 4hr X 2, if remains yellow, continue Q 4hrs  Escalate  MEWS: Escalate Yellow: discuss with charge nurse/RN and consider discussing with provider and RRT  Notify: Charge Nurse/RN  Name of Charge Nurse/RN Notified [facility] manager  Notify: Provider  Provider Name/Title Ouma  Date Provider Notified 01/04/21  Time Provider Notified 2100  Notification Type Page  Notification Reason Change in status  Response See new orders  Date of Provider Response 01/04/21  Time of Provider Response 2119  Document  Patient Outcome Stabilized after interventions   Central telemetry called to notify pt SpO2 was 89%. Anne Ng RN was entering room to check VS. Pt had SpO2 of 77%,while on 5L HF [place],increased work of breathing and tachypnic. O2 was titrated up which was ineffective. Pt was placed on 15L HF [place] and nonrebreather, which was effective at SpO2 93%. Pt now yellow MEWS. MD was notified and new orders received.

## 2021-01-05 NOTE — Plan of Care (Signed)
Upon entering pt room to obtain vs and CBG, I noticed pt was in respiratory distress. SpO2 was 77% on 5L/HFNC. Pt was tachypnic, RR 32. Pt was slid down in bed, I assisted him to move up as well as increase HFNC to 8L. Pt SpO2 only increased to low 80's at this time, so I put him on 15L/HFNC. This increased his SpO2 to mid 80's. I called for assistance and to have someone bring a non-rebreather. After applying non-rebreather at 15L as well as 15L/HFNC, pt SpO2 came up to 92%. I informed the pt's primary nurse and charge nurse at that time.

## 2021-01-05 NOTE — Significant Event (Signed)
Rapid Response Event Note   Reason for Call : nurse called with concerns for increased oxygen demand, tachypnea, major change in status   Initial Focused Assessment: entered room and found patient labored breathing 40 RR, O2 sat 27%, BP 741'O systolic, mildly tachycardic w/ HR 100-110. Patient was initially on 15L hi-flo nasal cannula with a NRB on top. Patient has crackles/rhonchi in upper lobes upon auscultation, diminished in lower lobes. Unable to speak without getting short of breath. Apparently this is a big change from yesterday where the patient was able to tolerate as low as 5L high-flow nasal cannula.      Interventions: Respiratory has already been notified by primary nurse. Assisted patient to 25L heated hi-flow nasal cannula at 100%. Noticed quick changes. Oxygen saturation rose to 93%, HR slowed to 90's. RR slowed to 32 breaths per minute. Patient endorses feeling better. BP 179/95 now.    Plan of Care:  Doctor requested transfer to [place]. There are no beds at this time, but patient is resting better. MD aware that transfer could take some time. MD spoke w/ patient about possibility of starting tocilizumab. Will receive patient downstairs to closely monitor given change in status.    Event Summary:   MD Notified: [place] Call Time: [id number] Arrival Time: 0802 End Time: 7672  Clarene Critchley, RN

## 2021-01-05 NOTE — Consult Note (Addendum)
[facility] Surgery Consult Note  Thomas Chung 04-06-1941  [id number].    Requesting MD: B Dahal Chief Complaint: Shortness of breath, weakness, diarrhea Reason for Consult: abdominal distension, ? SBO  HPI:  12/28/2020 with complaints of shortness of breath, weakness, and diarrhea.  Patient reports no appetite, weakness, cough and intermittent fever.  He was diagnosed with COVID-19 on December 20, 2020 he was treated conservatively and had no home O2 requirements.  He is not vaccinated.  Past medical history includes type 2 diabetes, pancreatic insufficiency on Creon, hypertension, hyperlipidemia, Hx gout, Hx thrombocytosis, B12 deficiency, and GERD.  No prior surgeries.  He has had diarrhea since 12/19/20, diagnosed with COVID on 12/22/20 home test confirmed by Health department 12/23/20.  He had a Flexiseal placed here and after the tube was removed his abdomen got distended.  No one else has diarrhea at home, he lives with his wife.  No prior issue with diarrhea.  No no diarrhea or flatus since tube removed.  Work-up in the ED shows he is initially afebrile, respiratory rate 25, sats 90% on 2 L nasal cannula.  Sodium 131, potassium 3.5, CO2 is 20, glucose 152, creatinine 1.27, LDH 289, CRP 5.0, lactate 1.4, pro calcitonin less than 0.10.  WBC 8.3, hemoglobin 14.7, hematocrit 43.9, platelets 326,000.  D-dimer 0.63, fibrinogen 507, PCR is positive for COVID negative for type A and B influenza.  Chest x-ray on admission shows low lung volumes, stable cardiac silhouette.  Moderate patchy opacities throughout the peripheral and mid to lower lungs bilaterally worse than the film on 12/29/2020.  Compatible with COVID-19 pneumonia. Abdominal film this a.m. shows dilated small bowel throughout the central abdomen with no calcification seen.  We are asked to see.  Review of Systems  Constitutional: Positive for fever (100.9 at home for several days) and malaise/fatigue.       Limited review of  symptoms, RR 40 on 100% FM, and [place], sats 93%  HENT: Negative.   Eyes: Negative.   Respiratory: Positive for cough and shortness of breath.   Cardiovascular: Negative.   Gastrointestinal: Positive for diarrhea (diarrhea started 1/30; dx COVID 2/2). Negative for blood in stool, constipation and melena.       Abdominal pain and bloating started after Flexiseal removed No issue with diarrhea before 12/19/20  Genitourinary: Negative.   Musculoskeletal: Negative.   Skin: Negative.   Neurological: Negative.   Endo/Heme/Allergies: Negative.   Psychiatric/Behavioral: Positive for depression.    Family History  Problem Relation Age of Onset  . Arrhythmia Mother   . Heart attack Mother   . Heart disease Mother   . Heart failure Father   . Diabetes Father   . Coronary artery disease Other        1st degree relative<60  . Alcohol abuse Other   . Diabetes Other   . Stomach cancer Sister   . Cancer Neg Hx   . Stroke Neg Hx   . Hyperlipidemia Neg Hx   . Hypertension Neg Hx   . Kidney disease Neg Hx     Past Medical History:  Diagnosis Date  . Depression   . Fatty liver   . Glucose intolerance (impaired glucose tolerance)   . HOH (hard of hearing)    bilateral hearing aides  . Hx of colonic polyps   . Hyperlipidemia   . Hypertension   . Neuromuscular disorder ([place])    neuropathy feet    Past Surgical History:  Procedure Laterality Date  . COLONOSCOPY  2006, 2009, 07/28/2011   2006 12 and 7 mm TVadenoma and adenoma 2009: 3 small adenomas 2012,:15mm rectal polyp  . COLONOSCOPY    . ORIF FINGER FRACTURE  02/08/2012   Procedure: OPEN REDUCTION INTERNAL FIXATION (ORIF) METACARPAL (FINGER) FRACTURE;  Surgeon: Cammie Sickle., MD;  Location: [place] [facility];  Service: Orthopedics;  Laterality: Right;  right small finger middle phalanx  . SQUAMOUS CELL CARCINOMA EXCISION     on scalp and nose  . TIBIA FRACTURE SURGERY  2001   right with hardware  . VASECTOMY    . WRIST  FRACTURE SURGERY  2002   right    Social History:  reports that he quit smoking about 51 years ago. He has never used smokeless tobacco. He reports current alcohol use of about 6.0 standard drinks of alcohol per week. He reports that he does not use drugs. Tobacco: 20 years 1PPD quit at age 26 ETOH:  social Drugs: none Work/Home:  Worked in Engineer, mining at [employer], Social research officer, government during [place]   Allergies:  Allergies  Allergen Reactions  . Amlodipine Swelling  . Prevnar [Pneumococcal 13-Val Conj Vacc]     Joint pain, swelling    Medications Prior to Admission  Medication Sig Dispense Refill  . Accu-Chek Softclix Lancets lancets Use to check blood sugar daily. DX: E11.8 100 each 3  . Alcohol Swabs (B-D SINGLE USE SWABS REGULAR) PADS Inject 1 Act into the skin See admin instructions. 300 each 2  . atorvastatin (LIPITOR) 40 MG tablet TAKE 1 TABLET EVERY DAY 90 tablet 1  . B Complex-C-Folic Acid (SUPER B COMPLEX/FA/VIT C) TABS Take by mouth.    . blood glucose meter kit and supplies KIT Dispense based on patient and insurance preference. Use to check to blood sugar daily. DX: E11.9 1 each 0  . chlorthalidone (HYGROTON) 25 MG tablet Take 1 tablet (25 mg total) by mouth daily. 90 tablet 1  . CREON 36000-114000 units CPEP capsule TAKE 1 CAPSULE THREE TIMES DAILY WITH MEALS 200 capsule 5  . cyanocobalamin 1000 MCG tablet Take 1 tablet (1,000 mcg total) by mouth daily. 90 tablet 1  . glucose blood test strip Use to check to blood sugar daily. DX: E11.9 100 each 3  . loperamide (IMODIUM) 2 MG capsule Take by mouth as needed for diarrhea or loose stools.    . metoprolol succinate (TOPROL-XL) 25 MG 24 hr tablet Take 1 tablet (25 mg total) by mouth daily. 90 tablet 1  . MITIGARE 0.6 MG CAPS Take 1 capsule by mouth as needed.     Marland Kitchen olmesartan (BENICAR) 40 MG tablet Take 1 tablet (40 mg total) by mouth daily. 90 tablet 1  . tadalafil (CIALIS) 5 MG tablet Take 1 tablet (5 mg total) by mouth daily as  needed for erectile dysfunction. 90 tablet 1    Blood pressure (!) 166/84, pulse 80, temperature 97.9 F (36.6 C), temperature source Axillary, resp. rate (!) 36, height $RemoveBe'6\' 4"'cfzLCgTaf$  (1.93 m), weight 116.3 kg, SpO2 (!) 74 %. Physical Exam: Currently BP 170/80, respiratory rate 40 with 100% facemask nasal cannula.  O2 sat 93%, heart rate 94 General: Acutely ill white male in respiratory distress, vital signs above. HEENT: head is normocephalic, atraumatic.  Sclera are noninjected.  Equal.  ENT: grossly normal, I did not take off O2 to examine.   Heart: regular, rate in the 90's. Normal s1,s2. No obvious murmurs, gallops, or rubs noted.  Palpable radial and pedal pulses bilaterally Lungs: bilateral  rales Abd: he is very distended and tender all over, no surgical scars seen.  BS hypoactive.  MS: all 4 extremities are symmetrical with no cyanosis, clubbing, or edema. Skin: warm and dry with no masses, lesions, or rashes Neuro: Cranial nerves 2-12 grossly intact, sensation is normal throughout Psych: A&Ox3 with an appropriate affect.   Results for orders placed or performed during the hospital encounter of 01/02/2021 (from the past 48 hour(s))  Glucose, capillary     Status: Abnormal   Collection Time: 01/03/21 11:27 AM  Result Value Ref Range   Glucose-Capillary 219 (H) 70 - 99 mg/dL    Comment: Glucose reference range applies only to samples taken after fasting for at least 8 hours.  Glucose, capillary     Status: Abnormal   Collection Time: 01/03/21  4:28 PM  Result Value Ref Range   Glucose-Capillary 217 (H) 70 - 99 mg/dL    Comment: Glucose reference range applies only to samples taken after fasting for at least 8 hours.  Glucose, capillary     Status: Abnormal   Collection Time: 01/03/21  8:09 PM  Result Value Ref Range   Glucose-Capillary 246 (H) 70 - 99 mg/dL    Comment: Glucose reference range applies only to samples taken after fasting for at least 8 hours.  CBC with Differential/Platelet      Status: Abnormal   Collection Time: 01/04/21  4:21 AM  Result Value Ref Range   WBC 10.9 (H) 4.0 - 10.5 K/uL   RBC 5.54 4.22 - 5.81 MIL/uL   Hemoglobin 14.1 13.0 - 17.0 g/dL   HCT 43.8 39.0 - 52.0 %   MCV 79.1 (L) 80.0 - 100.0 fL   MCH 25.5 (L) 26.0 - 34.0 pg   MCHC 32.2 30.0 - 36.0 g/dL   RDW 15.1 11.5 - 15.5 %   Platelets 354 150 - 400 K/uL   nRBC 0.0 0.0 - 0.2 %   Neutrophils Relative % 88 %   Neutro Abs 9.6 (H) 1.7 - 7.7 K/uL   Lymphocytes Relative 5 %   Lymphs Abs 0.5 (L) 0.7 - 4.0 K/uL   Monocytes Relative 6 %   Monocytes Absolute 0.6 0.1 - 1.0 K/uL   Eosinophils Relative 0 %   Eosinophils Absolute 0.0 0.0 - 0.5 K/uL   Basophils Relative 0 %   Basophils Absolute 0.0 0.0 - 0.1 K/uL   Immature Granulocytes 1 %   Abs Immature Granulocytes 0.07 0.00 - 0.07 K/uL    Comment: Performed at [facility], [place] [address], [place], [place] [address]  Comprehensive metabolic panel     Status: Abnormal   Collection Time: 01/04/21  4:21 AM  Result Value Ref Range   Sodium 138 135 - 145 mmol/L   Potassium 4.1 3.5 - 5.1 mmol/L   Chloride 105 98 - 111 mmol/L   CO2 24 22 - 32 mmol/L   Glucose, Bld 171 (H) 70 - 99 mg/dL    Comment: Glucose reference range applies only to samples taken after fasting for at least 8 hours.   BUN 34 (H) 8 - 23 mg/dL   Creatinine, Ser 1.02 0.61 - 1.24 mg/dL   Calcium 8.0 (L) 8.9 - 10.3 mg/dL   Total Protein 5.8 (L) 6.5 - 8.1 g/dL   Albumin 3.1 (L) 3.5 - 5.0 g/dL   AST 60 (H) 15 - 41 U/L   ALT 49 (H) 0 - 44 U/L   Alkaline Phosphatase 61 38 - 126 U/L  Total Bilirubin 0.7 0.3 - 1.2 mg/dL   GFR, Estimated >60 >60 mL/min    Comment: (NOTE) Calculated using the CKD-EPI Creatinine Equation (2021)    Anion gap 9 5 - 15    Comment: Performed at [facility], [place] [address] [place], [place] [address]  C-reactive protein     Status: Abnormal   Collection Time: 01/04/21  4:21 AM  Result Value Ref Range   CRP 1.0 (H)  <1.0 mg/dL    Comment: Performed at [facility], [place] [address], [place], [place] [address]  Glucose, capillary     Status: Abnormal   Collection Time: 01/04/21  7:39 AM  Result Value Ref Range   Glucose-Capillary 147 (H) 70 - 99 mg/dL    Comment: Glucose reference range applies only to samples taken after fasting for at least 8 hours.  Glucose, capillary     Status: Abnormal   Collection Time: 01/04/21 11:23 AM  Result Value Ref Range   Glucose-Capillary 263 (H) 70 - 99 mg/dL    Comment: Glucose reference range applies only to samples taken after fasting for at least 8 hours.  Glucose, capillary     Status: Abnormal   Collection Time: 01/04/21  4:39 PM  Result Value Ref Range   Glucose-Capillary 216 (H) 70 - 99 mg/dL    Comment: Glucose reference range applies only to samples taken after fasting for at least 8 hours.  Glucose, capillary     Status: Abnormal   Collection Time: 01/04/21  8:51 PM  Result Value Ref Range   Glucose-Capillary 165 (H) 70 - 99 mg/dL    Comment: Glucose reference range applies only to samples taken after fasting for at least 8 hours.   Comment 1 Notify RN   Blood gas, arterial     Status: Abnormal   Collection Time: 01/04/21 10:00 PM  Result Value Ref Range   pH, Arterial 7.414 7.350 - 7.450   pCO2 arterial 34.1 32.0 - 48.0 mmHg   pO2, Arterial 65.8 (L) 83.0 - 108.0 mmHg   Bicarbonate 21.4 20.0 - 28.0 mmol/L   Acid-base deficit 1.9 0.0 - 2.0 mmol/L   O2 Saturation 91.8 %   Patient temperature 98.6    Collection site RIGHT RADIAL    Allens test (pass/fail) PASS PASS    Comment: Performed at [facility], [place] [address], [place], [place] [address]  CBC with Differential/Platelet     Status: Abnormal   Collection Time: 01/05/21  4:38 AM  Result Value Ref Range   WBC 26.1 (H) 4.0 - 10.5 K/uL   RBC 5.95 (H) 4.22 - 5.81 MIL/uL   Hemoglobin 15.1 13.0 - 17.0 g/dL   HCT 46.4 39.0 - 52.0 %   MCV 78.0 (L) 80.0 - 100.0 fL    MCH 25.4 (L) 26.0 - 34.0 pg   MCHC 32.5 30.0 - 36.0 g/dL   RDW 15.5 11.5 - 15.5 %   Platelets 290 150 - 400 K/uL   nRBC 0.0 0.0 - 0.2 %   Neutrophils Relative % 85 %   Neutro Abs 21.9 (H) 1.7 - 7.7 K/uL   Lymphocytes Relative 2 %   Lymphs Abs 0.5 (L) 0.7 - 4.0 K/uL   Monocytes Relative 9 %   Monocytes Absolute 2.5 (H) 0.1 - 1.0 K/uL   Eosinophils Relative 2 %   Eosinophils Absolute 0.6 (H) 0.0 - 0.5 K/uL   Basophils Relative 0 %   Basophils Absolute 0.1 0.0 -  0.1 K/uL   Immature Granulocytes 2 %   Abs Immature Granulocytes 0.58 (H) 0.00 - 0.07 K/uL    Comment: Performed at [facility], [place] [address] [place], [place] [address]  Comprehensive metabolic panel     Status: Abnormal   Collection Time: 01/05/21  4:38 AM  Result Value Ref Range   Sodium 138 135 - 145 mmol/L   Potassium 4.1 3.5 - 5.1 mmol/L   Chloride 105 98 - 111 mmol/L   CO2 22 22 - 32 mmol/L   Glucose, Bld 192 (H) 70 - 99 mg/dL    Comment: Glucose reference range applies only to samples taken after fasting for at least 8 hours.   BUN 35 (H) 8 - 23 mg/dL   Creatinine, Ser 1.08 0.61 - 1.24 mg/dL   Calcium 8.1 (L) 8.9 - 10.3 mg/dL   Total Protein 6.2 (L) 6.5 - 8.1 g/dL   Albumin 3.3 (L) 3.5 - 5.0 g/dL   AST 78 (H) 15 - 41 U/L   ALT 62 (H) 0 - 44 U/L   Alkaline Phosphatase 118 38 - 126 U/L   Total Bilirubin 1.0 0.3 - 1.2 mg/dL   GFR, Estimated >60 >60 mL/min    Comment: (NOTE) Calculated using the CKD-EPI Creatinine Equation (2021)    Anion gap 11 5 - 15    Comment: Performed at [facility], [place] [address] [place], [place] [address]  C-reactive protein     Status: Abnormal   Collection Time: 01/05/21  4:38 AM  Result Value Ref Range   CRP 3.6 (H) <1.0 mg/dL    Comment: Performed at [facility], [place] [address] [place], [place] [address]  Glucose, capillary     Status: Abnormal   Collection Time: 01/05/21  7:51 AM  Result Value Ref Range    Glucose-Capillary 169 (H) 70 - 99 mg/dL    Comment: Glucose reference range applies only to samples taken after fasting for at least 8 hours.   DG Abd 1 View  Result Date: 01/05/2021 CLINICAL DATA:  Abdominal distension EXAM: ABDOMEN - 1 VIEW COMPARISON:  None. FINDINGS: Dilated small bowel throughout the central abdomen. No abnormal calcifications seen. IMPRESSION: Dilated small bowel throughout the central abdomen, concerning for small bowel obstruction. Electronically Signed   By: Ulyses Jarred M.D.   On: 01/05/2021 01:17   . sodium chloride 75 mL/hr at 01/05/21 0849  . tocilizumab (ACTEMRA) - non-COVID treatment        Assessment/Plan COVID pneumonia with respiratory distress  - solumedrol/remdexivir/tocilizumab Pancreatic insufficiency  -On Creon at home Type 2 diabetes Hypertension Hyperlipidemia Gout Hx B12 deficiency 20 pack yr hx of tobacco use, none since age 89.   Ileus vs SBO  - no prior abdominal surgeries Diarrhea - resolved  FEN: N.p.o./IV fluids ID:  tocilizumab 2/16>> day 1; remdesivir 2/12>> day 5 DVT: Lovenox  Plan:  I think he needs a CT, and his abdominal distension may be making his respiratory status worse. Agree with NPO/bowel rest.  He is waiting for a bed in the ICU, and I don't think it's safe right now to send him to CT.   Diarrhea started 3 days before his diagnosis of COVID on home test, and may be related.  Will review with Dr.Tseui.     Earnstine Regal [facility] Surgery 01/05/2021, 8:19 AM Please see Amion for pager number during day hours 7:00am-4:30pm

## 2021-01-05 NOTE — Progress Notes (Signed)
Attempted to call report to ICU, unable to reach. Will try to call again.

## 2021-01-05 NOTE — Plan of Care (Signed)
  Problem: Coping: Goal: Psychosocial and spiritual needs will be supported Outcome: Progressing   

## 2021-01-05 NOTE — Significant Event (Signed)
Blood pressure remains significantly elevated up to 190s despite as needed IV hydralazine and IV labetalol Patient is on normal saline at 75 mill per hour for n.p.o. status. Reduce IV fluid rate to 50 mill per hour Start on nicardipine drip at the lowest and titrate as needed with target blood pressure at systolic 101. I talked to patient with twice on the phone today. She states he drinks beer socially.  Sometimes he does not drink for several months. His tachycardia, hypertension and respiratory distress at this time is not related to alcohol withdrawal symptoms. Communicated with RN [facility]

## 2021-01-05 NOTE — Progress Notes (Signed)
Pt c/o stomach cramps and nausea. Pt report of only one small BM in two days. MD notified. Awaiting new orders.

## 2021-01-05 NOTE — Progress Notes (Signed)
Inpatient Diabetes Program Recommendations  AACE/ADA: New Consensus Statement on Inpatient Glycemic Control (2015)  Target Ranges:  Prepandial:   less than 140 mg/dL      Peak postprandial:   less than 180 mg/dL (1-2 hours)      Critically ill patients:  140 - 180 mg/dL   Lab Results  Component Value Date   GLUCAP 225 (H) 01/05/2021   HGBA1C 6.7 (H) 11/04/2020    Review of Glycemic Control Results for [name], [name]" (MRN [id number]) as of 01/05/2021 12:09  Ref. Range 01/04/2021 07:39 01/04/2021 11:23 01/04/2021 16:39 01/04/2021 20:51 01/05/2021 07:51 01/05/2021 11:54  Glucose-Capillary Latest Ref Range: 70 - 99 mg/dL 147 (H) 263 (H) 216 (H) 165 (H) 169 (H) 225 (H)   Diabetes history:  DM2 Current orders for Inpatient glycemic control:  Novolog 0-9 units TID and 0-5 units QHS Solumedrol 60 mg Q12H  Inpatient Diabetes Program Recommendations:    Novolog 3 units TID with meals if eats at least 50% of meal for elevated postprandial blood sugars.  Will continue to follow while inpatient.  Thank you, Reche [place], RN, BSN Diabetes Coordinator Inpatient Diabetes Program [phone] (team pager from 8a-5p)

## 2021-01-06 ENCOUNTER — Inpatient Hospital Stay (HOSPITAL_COMMUNITY): Payer: Medicare HMO

## 2021-01-06 DIAGNOSIS — U071 COVID-19: Principal | ICD-10-CM

## 2021-01-06 DIAGNOSIS — I5040 Unspecified combined systolic (congestive) and diastolic (congestive) heart failure: Secondary | ICD-10-CM

## 2021-01-06 DIAGNOSIS — J069 Acute upper respiratory infection, unspecified: Secondary | ICD-10-CM

## 2021-01-06 LAB — CBC WITH DIFFERENTIAL/PLATELET
Abs Immature Granulocytes: 0.54 10*3/uL — ABNORMAL HIGH (ref 0.00–0.07)
Basophils Absolute: 0.1 10*3/uL (ref 0.0–0.1)
Basophils Relative: 0 %
Eosinophils Absolute: 0 10*3/uL (ref 0.0–0.5)
Eosinophils Relative: 0 %
HCT: 44.3 % (ref 39.0–52.0)
Hemoglobin: 14.4 g/dL (ref 13.0–17.0)
Immature Granulocytes: 2 %
Lymphocytes Relative: 2 %
Lymphs Abs: 0.5 10*3/uL — ABNORMAL LOW (ref 0.7–4.0)
MCH: 25.4 pg — ABNORMAL LOW (ref 26.0–34.0)
MCHC: 32.5 g/dL (ref 30.0–36.0)
MCV: 78 fL — ABNORMAL LOW (ref 80.0–100.0)
Monocytes Absolute: 3 10*3/uL — ABNORMAL HIGH (ref 0.1–1.0)
Monocytes Relative: 9 %
Neutro Abs: 27.7 10*3/uL — ABNORMAL HIGH (ref 1.7–7.7)
Neutrophils Relative %: 87 %
Platelets: 220 10*3/uL (ref 150–400)
RBC: 5.68 MIL/uL (ref 4.22–5.81)
RDW: 15.7 % — ABNORMAL HIGH (ref 11.5–15.5)
WBC: 31.8 10*3/uL — ABNORMAL HIGH (ref 4.0–10.5)
nRBC: 0.2 % (ref 0.0–0.2)

## 2021-01-06 LAB — PHOSPHORUS: Phosphorus: 2.3 mg/dL — ABNORMAL LOW (ref 2.5–4.6)

## 2021-01-06 LAB — COMPREHENSIVE METABOLIC PANEL
ALT: 73 U/L — ABNORMAL HIGH (ref 0–44)
AST: 78 U/L — ABNORMAL HIGH (ref 15–41)
Albumin: 3.1 g/dL — ABNORMAL LOW (ref 3.5–5.0)
Alkaline Phosphatase: 164 U/L — ABNORMAL HIGH (ref 38–126)
Anion gap: 8 (ref 5–15)
BUN: 41 mg/dL — ABNORMAL HIGH (ref 8–23)
CO2: 26 mmol/L (ref 22–32)
Calcium: 8 mg/dL — ABNORMAL LOW (ref 8.9–10.3)
Chloride: 108 mmol/L (ref 98–111)
Creatinine, Ser: 1.41 mg/dL — ABNORMAL HIGH (ref 0.61–1.24)
GFR, Estimated: 51 mL/min — ABNORMAL LOW (ref >60)
Glucose, Bld: 268 mg/dL — ABNORMAL HIGH (ref 70–99)
Potassium: 4.3 mmol/L (ref 3.5–5.1)
Sodium: 142 mmol/L (ref 135–145)
Total Bilirubin: 1.6 mg/dL — ABNORMAL HIGH (ref 0.3–1.2)
Total Protein: 6 g/dL — ABNORMAL LOW (ref 6.5–8.1)

## 2021-01-06 LAB — ECHOCARDIOGRAM COMPLETE
Area-P 1/2: 6.17 cm2
Calc EF: 58.6 %
Height: 76 in
MV VTI: 4.13 cm2
S' Lateral: 2.5 cm
Single Plane A2C EF: 57.5 %
Single Plane A4C EF: 61.6 %
Weight: 4038.83 oz

## 2021-01-06 LAB — BLOOD GAS, ARTERIAL
Acid-base deficit: 1.5 mmol/L (ref 0.0–2.0)
Bicarbonate: 23.5 mmol/L (ref 20.0–28.0)
Drawn by: 29503
FIO2: 70
MECHVT: 610 mL
O2 Saturation: 93.5 %
PEEP: 8 cmH2O
Patient temperature: 98.6
RATE: 26 resp/min
pCO2 arterial: 43 mmHg (ref 32.0–48.0)
pH, Arterial: 7.358 (ref 7.350–7.450)
pO2, Arterial: 76.6 mmHg — ABNORMAL LOW (ref 83.0–108.0)

## 2021-01-06 LAB — CULTURE, BLOOD (ROUTINE X 2)
Culture: NO GROWTH
Culture: NO GROWTH
Special Requests: ADEQUATE
Special Requests: ADEQUATE

## 2021-01-06 LAB — PROCALCITONIN: Procalcitonin: 0.43 ng/mL

## 2021-01-06 LAB — GLUCOSE, CAPILLARY
Glucose-Capillary: 238 mg/dL — ABNORMAL HIGH (ref 70–99)
Glucose-Capillary: 237 mg/dL — ABNORMAL HIGH (ref 70–99)
Glucose-Capillary: 246 mg/dL — ABNORMAL HIGH (ref 70–99)
Glucose-Capillary: 291 mg/dL — ABNORMAL HIGH (ref 70–99)
Glucose-Capillary: 284 mg/dL — ABNORMAL HIGH (ref 70–99)
Glucose-Capillary: 280 mg/dL — ABNORMAL HIGH (ref 70–99)

## 2021-01-06 LAB — MAGNESIUM: Magnesium: 2.7 mg/dL — ABNORMAL HIGH (ref 1.7–2.4)

## 2021-01-06 LAB — C-REACTIVE PROTEIN: CRP: 12.7 mg/dL — ABNORMAL HIGH (ref <1.0)

## 2021-01-06 MED ORDER — LIDOCAINE HCL URETHRAL/MUCOSAL 2 % EX GEL
1.0000 "application " | Freq: Once | CUTANEOUS | Status: DC
  Filled 2021-01-06: qty 5

## 2021-01-06 MED ORDER — VECURONIUM BROMIDE 10 MG IV SOLR
INTRAVENOUS | Status: AC
  Filled 2021-01-06: qty 10

## 2021-01-06 MED ORDER — ETOMIDATE 2 MG/ML IV SOLN
INTRAVENOUS | Status: AC
  Administered 2021-01-06: 20 mg via INTRAVENOUS
  Filled 2021-01-06: qty 20

## 2021-01-06 MED ORDER — SUCCINYLCHOLINE CHLORIDE 200 MG/10ML IV SOSY
PREFILLED_SYRINGE | INTRAVENOUS | Status: AC
  Filled 2021-01-06: qty 10

## 2021-01-06 MED ORDER — ETOMIDATE 2 MG/ML IV SOLN: 20.0000 mg | Freq: Once | INTRAVENOUS | Status: AC

## 2021-01-06 MED ORDER — POLYETHYLENE GLYCOL 3350 17 G PO PACK
17.0000 g | PACK | Freq: Every day | ORAL | Status: DC
  Administered 2021-01-06 – 2021-01-08 (×3): 17 g
  Filled 2021-01-06 (×2): qty 1

## 2021-01-06 MED ORDER — ORAL CARE MOUTH RINSE
15.0000 mL | OROMUCOSAL | Status: DC
  Administered 2021-01-06 – 2021-01-15 (×86): 15 mL via OROMUCOSAL

## 2021-01-06 MED ORDER — ROCURONIUM BROMIDE 10 MG/ML (PF) SYRINGE
PREFILLED_SYRINGE | INTRAVENOUS | Status: AC
  Administered 2021-01-06: 100 mg
  Filled 2021-01-06: qty 10

## 2021-01-06 MED ORDER — PROPOFOL 1000 MG/100ML IV EMUL
0.0000 ug/kg/min | INTRAVENOUS | Status: DC
  Administered 2021-01-06: 35 ug/kg/min via INTRAVENOUS
  Administered 2021-01-06: 5 ug/kg/min via INTRAVENOUS
  Administered 2021-01-06 – 2021-01-07 (×3): 35 ug/kg/min via INTRAVENOUS
  Administered 2021-01-07 (×2): 45 ug/kg/min via INTRAVENOUS
  Administered 2021-01-07: 35 ug/kg/min via INTRAVENOUS
  Administered 2021-01-07 (×2): 45 ug/kg/min via INTRAVENOUS
  Administered 2021-01-08: 40 ug/kg/min via INTRAVENOUS
  Administered 2021-01-08 (×6): 45 ug/kg/min via INTRAVENOUS
  Administered 2021-01-08: 35 ug/kg/min via INTRAVENOUS
  Administered 2021-01-08: 45 ug/kg/min via INTRAVENOUS
  Administered 2021-01-09: 50 ug/kg/min via INTRAVENOUS
  Administered 2021-01-09 (×7): 40 ug/kg/min via INTRAVENOUS
  Administered 2021-01-10: 20 ug/kg/min via INTRAVENOUS
  Administered 2021-01-10 (×2): 50 ug/kg/min via INTRAVENOUS
  Filled 2021-01-06 (×21): qty 100
  Filled 2021-01-06: qty 200
  Filled 2021-01-06 (×5): qty 100

## 2021-01-06 MED ORDER — LIDOCAINE HCL (CARDIAC) PF 100 MG/5ML IV SOSY
PREFILLED_SYRINGE | INTRAVENOUS | Status: AC
  Filled 2021-01-06: qty 5

## 2021-01-06 MED ORDER — MIDAZOLAM HCL 2 MG/2ML IJ SOLN: 2.0000 mg | Freq: Once | INTRAMUSCULAR | Status: AC

## 2021-01-06 MED ORDER — CHLORHEXIDINE GLUCONATE 0.12% ORAL RINSE (MEDLINE KIT)
15.0000 mL | Freq: Two times a day (BID) | OROMUCOSAL | Status: DC
  Administered 2021-01-06 – 2021-01-15 (×18): 15 mL via OROMUCOSAL

## 2021-01-06 MED ORDER — MIDAZOLAM HCL 2 MG/2ML IJ SOLN
INTRAMUSCULAR | Status: AC
  Administered 2021-01-06: 2 mg via INTRAVENOUS
  Filled 2021-01-06: qty 4

## 2021-01-06 MED ORDER — ACETAMINOPHEN 160 MG/5ML PO SOLN: 650.0000 mg | Freq: Four times a day (QID) | ORAL | Status: DC | PRN

## 2021-01-06 MED ORDER — FENTANYL CITRATE (PF) 100 MCG/2ML IJ SOLN
INTRAMUSCULAR | Status: AC
  Administered 2021-01-06: 100 ug via INTRAVENOUS
  Filled 2021-01-06: qty 2

## 2021-01-06 MED ORDER — IPRATROPIUM-ALBUTEROL 0.5-2.5 (3) MG/3ML IN SOLN
3.0000 mL | Freq: Four times a day (QID) | RESPIRATORY_TRACT | Status: DC
  Administered 2021-01-06 – 2021-01-09 (×12): 3 mL via RESPIRATORY_TRACT
  Filled 2021-01-06 (×10): qty 3

## 2021-01-06 MED ORDER — FENTANYL CITRATE (PF) 100 MCG/2ML IJ SOLN
50.0000 ug | INTRAMUSCULAR | Status: AC | PRN
  Administered 2021-01-06 – 2021-01-07 (×3): 50 ug via INTRAVENOUS
  Filled 2021-01-06 (×2): qty 2

## 2021-01-06 MED ORDER — PROPOFOL 500 MG/50ML IV EMUL
INTRAVENOUS | Status: AC
  Filled 2021-01-06: qty 50

## 2021-01-06 MED ORDER — ROCURONIUM BROMIDE 50 MG/5ML IV SOLN: 100.0000 mg | Freq: Once | INTRAVENOUS | Status: DC

## 2021-01-06 MED ORDER — STERILE WATER FOR INJECTION IJ SOLN
INTRAMUSCULAR | Status: AC
  Filled 2021-01-06: qty 10

## 2021-01-06 MED ORDER — DOCUSATE SODIUM 50 MG/5ML PO LIQD
100.0000 mg | Freq: Two times a day (BID) | ORAL | Status: DC
  Administered 2021-01-06 – 2021-01-08 (×6): 100 mg
  Filled 2021-01-06 (×5): qty 10

## 2021-01-06 MED ORDER — FENTANYL CITRATE (PF) 100 MCG/2ML IJ SOLN: 100.0000 ug | Freq: Once | INTRAMUSCULAR | Status: AC

## 2021-01-06 MED ORDER — FENTANYL CITRATE (PF) 100 MCG/2ML IJ SOLN
50.0000 ug | INTRAMUSCULAR | Status: DC | PRN
  Administered 2021-01-07: 16:00:00 100 ug via INTRAVENOUS
  Administered 2021-01-07: 50 ug via INTRAVENOUS
  Administered 2021-01-08: 09:00:00 100 ug via INTRAVENOUS
  Administered 2021-01-09: 03:00:00 50 ug via INTRAVENOUS
  Filled 2021-01-06 (×2): qty 2

## 2021-01-06 NOTE — Progress Notes (Signed)
RT attempted times two to obtain ordered sputum sample- PT dry.

## 2021-01-06 NOTE — Progress Notes (Signed)
Changed water bottle on [facility] system- uneventful.

## 2021-01-06 NOTE — Progress Notes (Signed)
  Echocardiogram 2D Echocardiogram has been performed.  Bobbye [place] 01/06/2021, 1:34 PM

## 2021-01-06 NOTE — Procedures (Signed)
Intubation Procedure Note Thomas Chung [id number] February 27, 1941  Procedure: Intubation Indications: Respiratory insufficiency  Procedure Details Consent: Risks of procedure as well as the alternatives and risks of each were explained to the (patient/caregiver).  Consent for procedure obtained. Time Out: Verified patient identification, verified procedure, site/side was marked, verified correct patient position, special equipment/implants available, medications/allergies/relevent history reviewed, required imaging and test results available.  Performed  MAC and 4 Medications:  Fentanyl 100 mcg Etomidate 20 mg Versed 2 mg NMB rocuronium 100 mg   Evaluation Hemodynamic Status: BP stable throughout; O2 sats: stable throughout Patient's Current Condition: stable Complications: No apparent complications Patient did tolerate procedure well. Chest X-ray ordered to verify placement.  CXR: tube position acceptable.  NG tube placed per NP.   Thomas Chung ACNP Maryanna Shape PCCM Pager [phone] till 3 pm If no answer page [phone] 01/06/2021, 10:58 AM

## 2021-01-06 NOTE — Progress Notes (Signed)
Patient got worse overnight. On chart review this morning, noted that patient was increasingly tachypneic.  Was requiring more oxygen than yesterday. Creatinine worsening. I was concerned that patient is getting tired out and could code soon. PCCM consulted. Patient was electively intubated.  Currently under PCCM service Will not bill for today.

## 2021-01-06 NOTE — Progress Notes (Signed)
Spiritual care responding to consult.  Pt currently intubated.

## 2021-01-06 NOTE — Progress Notes (Signed)
Pt belongings including clothes and wallet given to daughter Jonelle Sidle and taken home.

## 2021-01-06 NOTE — Progress Notes (Addendum)
Inpatient Diabetes Program Recommendations  AACE/ADA: New Consensus Statement on Inpatient Glycemic Control (2015)  Target Ranges:  Prepandial:   less than 140 mg/dL      Peak postprandial:   less than 180 mg/dL (1-2 hours)      Critically ill patients:  140 - 180 mg/dL   Lab Results  Component Value Date   GLUCAP 246 (H) 01/06/2021   HGBA1C 6.7 (H) 11/04/2020    Review of Glycemic Control Results for [name], [name]" (MRN [id number]) as of 01/06/2021 10:05  Ref. Range 01/05/2021 07:51 01/05/2021 11:54 01/05/2021 15:21 01/05/2021 19:34 01/05/2021 23:33 01/06/2021 03:08 01/06/2021 05:19 01/06/2021 08:40  Glucose-Capillary Latest Ref Range: 70 - 99 mg/dL 169 (H) 225 (H) 236 (H) 246 (H) 254 (H) 238 (H) 237 (H) 246 (H)    Current orders for Inpatient glycemic control:  Novolog 0-9 units Q4H Solumedrol 60 mg Q12H  Inpatient Diabetes Program Recommendations:     -Novolog 0-15 units Q4H -If CBG's remain elevated might consider Levemir 10 units daily while on steroids  Will continue to follow while inpatient.  Thank you, Thomas [place], RN, BSN Diabetes Coordinator Inpatient Diabetes Program [phone] (team pager from 8a-5p)

## 2021-01-06 NOTE — Consult Note (Signed)
NAME:  Thomas Chung, MRN:  [id number], DOB:  1941-11-20, LOS: 5 ADMISSION DATE:  01/02/2021, CONSULTATION DATE: 01/06/2019 REFERRING MD: [facility], CHIEF COMPLAINT: Acute hypoxic respiratory failure  Brief History:  80 year old with history of Covid on 12/22/2020 who defervesced and required intubation on 01/06/2021  History of Present Illness:  80 year old former smoker quit when he was 64 he has a past medical history for Covid on 12/22/2020, along with type 2 diabetes with neuropathy pancreatic insufficiency hypertension history of gastroesophageal reflux disease.  He was admitted on 01/17/2021 with worsening respiratory distress and suspected ileus.  On 01/04/2021 had increasing oxygen demand demands and by 01/06/2021 was on 100% 35 L flow of oxygen pulmonary critical care was asked to evaluate and he was intubated at that time.  He has completed antiviral therapy.  He remains on Solu-Medrol and Acterma.  Currently is on full mechanical ventilatory support ARDS protocol his wife and daughter have been updated via the phone.  Past Medical History:   Past Medical History:  Diagnosis Date  . Depression   . Fatty liver   . Glucose intolerance (impaired glucose tolerance)   . HOH (hard of hearing)    bilateral hearing aides  . Hx of colonic polyps   . Hyperlipidemia   . Hypertension   . Neuromuscular disorder ([place])    neuropathy feet    Significant Hospital Events:  01/06/2021 intubated  Consults:  General surgery Critical care  Procedures:  01/06/2021 intubation 17 2022 NG tube left nares>>  Significant Diagnostic Tests:    Micro Data:  01/07/2020 sputum>>  Antimicrobials:    Interim History / Subjective:  80 year old former smoker diagnosed with COVID 12/22/2020 has defervesced over the last 48 hours and is requiring 100% oxygen therefore was intubated.  Objective   Blood pressure (!) 151/77, pulse (!) 114, temperature 97.7 F (36.5 C), temperature source Axillary, resp. rate  (!) 35, height $RemoveBe'6\' 4"'IhhOFvZtW$  (1.93 m), weight 114.5 kg, SpO2 90 %.    FiO2 (%):  [90 %-100 %] 100 %   Intake/Output Summary (Last 24 hours) at 01/06/2021 0901 Last data filed at 01/06/2021 0600 Gross per 24 hour  Intake 2192.82 ml  Output 1375 ml  Net 817.82 ml   Filed Weights   12/26/2020 2125 12/27/2020 2306 01/05/21 1141  Weight: 116.3 kg 116.3 kg 114.5 kg    Examination: General: Elderly male in obvious respiratory distress requiring intubation HENT: No JVD or lymphadenopathy appreciated oropharynx extremely dry Lungs: Air movement throughout abdominal chest wall paradoxus noted Cardiovascular: Sinus tach currently on Cardene drip Abdomen: Less distended no bowel sounds, with NG tube place copious air removed but no fluids Extremities: 1+ edema Neuro: Prior to intubation was grossly intact GU: Amber urine  Resolved Hospital Problem list     Assessment & Plan:  Ventilator dependent respiratory failure in the setting of Covid hypoxia bilateral airspace disease refractory to prior treatment.  Tested positive for Covid on 12/22/2020 he was unvaccinated for COVID-19. Continue Actemra and steroids ARDS protocol Serial chest x-ray Wean FiO2 as tolerated Pulmonary toilet Questionable antibiotics for underlying bacterial infection  COVID-19 diagnosed 12/22/2020 completed antiviral therapy currently on Actemra and Solu-Medrol. Continue current treatment  Hypertension Cardene has been discontinued, suspect sedation will alleviate a lot of catecholamines and therefore decrease blood pressure. Continue as needed's for systolic blood pressure greater than 160  Diabetes mellitus type 2 Sliding scale insulin protocol  Suspected small bowel obstruction/ileus NG tube placed 01/06/2021 without any obvious drainage but did decompress  air from stomach General surgery is continuing to follow Started on narcotics for sedation need to monitor closely.  History of pancreatic insufficiency Creon is on  hold   Best practice (evaluated daily)  Diet: npo due to ileus Pain/Anxiety/Delirium protocol (if indicated): As needed VAP protocol (if indicated): If intubated DVT prophylaxis: PPI Glucose control: Sliding scale insulin protocol Mobility: Bedrest Disposition: Intensive care unit  Goals of Care:  Last date of multidisciplinary goals of care discussion: 01/06/2021 via phone Family and staff present: 01/06/2021 spoke with wife and daughter made them aware of impending intubation.Updated in person 2/17 per MD Summary of discussion: Agree they want intubation if necessary. Follow up goals of care discussion due:2/24 Code Status: Full code  Labs   CBC: Recent Labs  Lab 01/02/21 0434 01/03/21 0253 01/04/21 0421 01/05/21 0438 01/06/21 0258  WBC 3.8* 6.8 10.9* 26.1* 31.8*  NEUTROABS 3.1 5.9 9.6* 21.9* 27.7*  HGB 13.8 13.8 14.1 15.1 14.4  HCT 42.4 42.3 43.8 46.4 44.3  MCV 78.4* 78.6* 79.1* 78.0* 78.0*  PLT 280 339 354 290 540    Basic Metabolic Panel: Recent Labs  Lab 01/02/21 0434 01/03/21 0253 01/04/21 0421 01/05/21 0438 01/06/21 0258  NA 134* 136 138 138 142  K 3.7 4.0 4.1 4.1 4.3  CL 102 103 105 105 108  CO2 21* $Remov'23 24 22 26  'hbuWnq$ GLUCOSE 201* 176* 171* 192* 268*  BUN 33* 37* 34* 35* 41*  CREATININE 1.02 1.11 1.02 1.08 1.41*  CALCIUM 8.1* 8.0* 8.0* 8.1* 8.0*  MG  --   --   --   --  2.7*  PHOS  --   --   --   --  2.3*   GFR: Estimated Creatinine Clearance: 58.8 mL/min (A) (by C-G formula based on SCr of 1.41 mg/dL (H)). Recent Labs  Lab 01/05/2021 1725 01/14/2021 1726 01/02/21 0434 01/03/21 0253 01/04/21 0421 01/05/21 0438 01/06/21 0258  PROCALCITON  --  <0.10  --   --   --   --   --   WBC  --  8.3   < > 6.8 10.9* 26.1* 31.8*  LATICACIDVEN 1.4  --   --   --   --   --   --    < > = values in this interval not displayed.    Liver Function Tests: Recent Labs  Lab 01/02/21 0434 01/03/21 0253 01/04/21 0421 01/05/21 0438 01/06/21 0258  AST 47* 54* 60* 78* 78*   ALT 32 41 49* 62* 73*  ALKPHOS 51 50 61 118 164*  BILITOT 0.8 0.8 0.7 1.0 1.6*  PROT 6.3* 5.9* 5.8* 6.2* 6.0*  ALBUMIN 3.3* 3.1* 3.1* 3.3* 3.1*   No results for input(s): LIPASE, AMYLASE in the last 168 hours. No results for input(s): AMMONIA in the last 168 hours.  ABG    Component Value Date/Time   PHART 7.414 01/04/2021 2200   PCO2ART 34.1 01/04/2021 2200   PO2ART 65.8 (L) 01/04/2021 2200   HCO3 21.4 01/04/2021 2200   ACIDBASEDEF 1.9 01/04/2021 2200   O2SAT 91.8 01/04/2021 2200     Coagulation Profile: No results for input(s): INR, PROTIME in the last 168 hours.  Cardiac Enzymes: No results for input(s): CKTOTAL, CKMB, CKMBINDEX, TROPONINI in the last 168 hours.  HbA1C: Hgb A1c MFr Bld  Date/Time Value Ref Range Status  11/04/2020 02:29 PM 6.7 (H) 4.6 - 6.5 % Final    Comment:    Glycemic Control Guidelines for People with Diabetes:Non Diabetic:  <6%Goal of  Therapy: <7%Additional Action Suggested:  >8%   08/05/2020 01:45 PM 6.5 (H) <5.7 % of total Hgb Final    Comment:    For someone without known diabetes, a hemoglobin A1c value of 6.5% or greater indicates that they may have  diabetes and this should be confirmed with a follow-up  test. . For someone with known diabetes, a value <7% indicates  that their diabetes is well controlled and a value  greater than or equal to 7% indicates suboptimal  control. A1c targets should be individualized based on  duration of diabetes, age, comorbid conditions, and  other considerations. . Currently, no consensus exists regarding use of hemoglobin A1c for diagnosis of diabetes for children. .     CBG: Recent Labs  Lab 01/05/21 1934 01/05/21 2333 01/06/21 0308 01/06/21 0519 01/06/21 0840  GLUCAP 246* 254* 238* 237* 246*    Review of Systems:   na  Past Medical History:  He,  has a past medical history of Depression, Fatty liver, Glucose intolerance (impaired glucose tolerance), HOH (hard of hearing), colonic  polyps, Hyperlipidemia, Hypertension, and Neuromuscular disorder (HCC).   Surgical History:   Past Surgical History:  Procedure Laterality Date  . COLONOSCOPY  2006, 2009, 07/28/2011   2006 12 and 7 mm TVadenoma and adenoma 2009: 3 small adenomas 2012,:82mm rectal polyp  . COLONOSCOPY    . ORIF FINGER FRACTURE  02/08/2012   Procedure: OPEN REDUCTION INTERNAL FIXATION (ORIF) METACARPAL (FINGER) FRACTURE;  Surgeon: Wyn Forster., MD;  Location: [facility];  Service: Orthopedics;  Laterality: Right;  right small finger middle phalanx  . SQUAMOUS CELL CARCINOMA EXCISION     on scalp and nose  . TIBIA FRACTURE SURGERY  2001   right with hardware  . VASECTOMY    . WRIST FRACTURE SURGERY  2002   right     Social History:   reports that he quit smoking about 51 years ago. He has never used smokeless tobacco. He reports current alcohol use of about 6.0 standard drinks of alcohol per week. He reports that he does not use drugs.   Family History:  His family history includes Alcohol abuse in an other family member; Arrhythmia in his mother; Coronary artery disease in an other family member; Diabetes in his father and another family member; Heart attack in his mother; Heart disease in his mother; Heart failure in his father; Stomach cancer in his sister. There is no history of Cancer, Stroke, Hyperlipidemia, Hypertension, or Kidney disease.   Allergies Allergies  Allergen Reactions  . Amlodipine Swelling    Swelling in legs; no shortness of breath or tongue swelling.  Marland Kitchen Prevnar [Pneumococcal 13-Val Conj Vacc]     Joint pain, swelling     Home Medications  Prior to Admission medications   Medication Sig Start Date End Date Taking? Authorizing Provider  Accu-Chek Softclix Lancets lancets Use to check blood sugar daily. DX: E11.8 07/07/20  Yes Etta Grandchild, MD  Alcohol Swabs (B-D SINGLE USE SWABS REGULAR) PADS Inject 1 Act into the skin See admin instructions. 02/27/18  Yes  Etta Grandchild, MD  atorvastatin (LIPITOR) 40 MG tablet TAKE 1 TABLET EVERY DAY 11/29/20  Yes Etta Grandchild, MD  B Complex-C-Folic Acid (SUPER B COMPLEX/FA/VIT C) TABS Take by mouth.   Yes [provider]  blood glucose meter kit and supplies KIT Dispense based on patient and insurance preference. Use to check to blood sugar daily. DX: E11.9 07/07/20  Yes Yetta Barre,  Arvid Right, MD  chlorthalidone (HYGROTON) 25 MG tablet Take 1 tablet (25 mg total) by mouth daily. 11/15/20  Yes Janith Lima, MD  CREON [phone] units CPEP capsule TAKE 1 CAPSULE THREE TIMES DAILY WITH MEALS 09/18/20  Yes Janith Lima, MD  cyanocobalamin 1000 MCG tablet Take 1 tablet (1,000 mcg total) by mouth daily. 02/05/20  Yes Janith Lima, MD  glucose blood test strip Use to check to blood sugar daily. DX: E11.9 07/07/20  Yes Janith Lima, MD  loperamide (IMODIUM) 2 MG capsule Take by mouth as needed for diarrhea or loose stools.   Yes [provider]  metoprolol succinate (TOPROL-XL) 25 MG 24 hr tablet Take 1 tablet (25 mg total) by mouth daily. 11/04/20  Yes Janith Lima, MD  MITIGARE 0.6 MG CAPS Take 1 capsule by mouth as needed.  06/26/20  Yes [provider]  olmesartan (BENICAR) 40 MG tablet Take 1 tablet (40 mg total) by mouth daily. 09/22/20  Yes Janith Lima, MD  tadalafil (CIALIS) 5 MG tablet Take 1 tablet (5 mg total) by mouth daily as needed for erectile dysfunction. 03/04/19  Yes Janith Lima, MD  allopurinol (ZYLOPRIM) 100 MG tablet TAKE 1 TABLET (100 MG TOTAL) BY MOUTH DAILY. 01/04/21   Janith Lima, MD     Critical care time: 38 min      Richardson Landry Bridgitt Raggio ACNP Acute Care Nurse Practitioner [place] Please consult Amion 01/06/2021, 9:01 AM

## 2021-01-06 NOTE — Progress Notes (Signed)
CC: Short of breath/weakness/diarrhea  Subjective: Patient continues to be tachycardic, tachypneic, and hypertensive.  He still working hard to breathe.  He also complains of the mouth being very dry.  No flatus or BM.  Abdomen still distended but is not  tender as it was yesterday.  No nausea, no vomiting, no flatus, or BM.  Objective: Vital signs in last 24 hours: Temp:  [97.6 F (36.4 C)-99.6 F (37.6 C)] 97.7 F (36.5 C) (02/17 0400) Pulse Rate:  [70-114] 112 (02/17 0545) Resp:  [20-38] 34 (02/17 0545) BP: (129-196)/(56-107) 168/94 (02/17 0545) SpO2:  [82 %-96 %] 84 % (02/17 0545) FiO2 (%):  [90 %-100 %] 100 % (02/17 0342) Weight:  [114.5 kg] 114.5 kg (02/16 1141) Last BM Date: 01/03/21 N.p.o. 2192 IV 1375 urine Heart rate in the 112-17 Respiratory rate of 29-37 Sats are in the 80s on high flow nasal cannula and facemask.  Intake/Output from previous day: 02/16 0701 - 02/17 0700 In: 2192.8 [I.V.:2092.8; IV Piggyback:100] Out: 5409 [Urine:1375] Intake/Output this shift: No intake/output data recorded.  General appearance: alert, cooperative and moderate distress Resp: His respiratory rates in the 30s on facemask and high flow nasal cannula.  Satting in the upper 80s right now. GI: He still distended, soft, nontender, bowel sounds are hypoactive.  No flatus or BM.  Lab Results:  Recent Labs    01/05/21 0438 01/06/21 0258  WBC 26.1* 31.8*  HGB 15.1 14.4  HCT 46.4 44.3  PLT 290 220    BMET Recent Labs    01/05/21 0438 01/06/21 0258  NA 138 142  K 4.1 4.3  CL 105 108  CO2 22 26  GLUCOSE 192* 268*  BUN 35* 41*  CREATININE 1.08 1.41*  CALCIUM 8.1* 8.0*   PT/INR No results for input(s): LABPROT, INR in the last 72 hours.  Recent Labs  Lab 01/02/21 0434 01/03/21 0253 01/04/21 0421 01/05/21 0438 01/06/21 0258  AST 47* 54* 60* 78* 78*  ALT 32 41 49* 62* 73*  ALKPHOS 51 50 61 118 164*  BILITOT 0.8 0.8 0.7 1.0 1.6*  PROT 6.3* 5.9* 5.8* 6.2*  6.0*  ALBUMIN 3.3* 3.1* 3.1* 3.3* 3.1*     Lipase  No results found for: LIPASE   Medications: . Chlorhexidine Gluconate Cloth  6 each Topical Daily  . enoxaparin (LOVENOX) injection  60 mg Subcutaneous Q24H  . insulin aspart  0-9 Units Subcutaneous Q4H  . Ipratropium-Albuterol  1 puff Inhalation Q6H  . mouth rinse  15 mL Mouth Rinse BID  . methylPREDNISolone (SOLU-MEDROL) injection  60 mg Intravenous Q12H  . ondansetron (ZOFRAN) IV  4 mg Intravenous Once  . pantoprazole (PROTONIX) IV  40 mg Intravenous Q12H    Assessment/Plan COVID pneumonia with respiratory distress - solumedrol/remdexivir/tocilizumab  - CRP 1.0>>3.6>>12.7  - WBC 10.9>>26.1>>31.8 Pancreaticinsufficiency -On Creon at home Type 2 diabetes Hypertension Hyperlipidemia Gout Hx B12 deficiency 20 pack yr hx of tobacco use, none since age 75.   Ileus vs SBO - no prior abdominal surgeries Diarrhea - resolved  FEN: N.p.o./IV fluids ID: tocilizumab 2/16; remdesivir 2/12-2/16 DVT: Lovenox  Pllan:  I would give him some swabs to help with his dry mouth and a couple ice chips every hour just for oral comfort.  Otherwise keep him n.p.o.  If he has any nausea or vomiting he needs an NG.  If he requires intubation I would place an NG or OG at that time also.      LOS: 5 days  Tamira Ryland 01/06/2021 Please see Amion

## 2021-01-06 NOTE — Progress Notes (Signed)
PT Cancellation Note  Patient Details Name: Thomas Chung MRN: [id number] DOB: [birth year]   Cancelled Treatment:    Reason Eval/Treat Not Completed: Medical issues which prohibited therapy  Pt just intubated.  Augie Vane,KATHrine E 01/06/2021, 10:08 AM Arlyce Dice, DPT Acute Rehabilitation Services Pager: [phone] Office: [phone]

## 2021-01-06 NOTE — Progress Notes (Signed)
[facility] Progress Note Patient Name: Thomas Chung DOB: Mar 28, 1941 MRN: [id number]   Date of Service  01/06/2021  HPI/Events of Note  Patient needs a PRN order for Tylenol for fever, he also needs an order for maintenance iv fluids.  eICU Interventions  D5 LR ordered at 75 ml / hour, Tylenol order modified to include  Pain indication.        Kerry Kass Christan Defranco 01/06/2021, 11:26 PM

## 2021-01-07 ENCOUNTER — Inpatient Hospital Stay (HOSPITAL_COMMUNITY): Payer: Medicare HMO

## 2021-01-07 DIAGNOSIS — U071 COVID-19: Secondary | ICD-10-CM | POA: Diagnosis not present

## 2021-01-07 DIAGNOSIS — J069 Acute upper respiratory infection, unspecified: Secondary | ICD-10-CM | POA: Diagnosis not present

## 2021-01-07 LAB — BASIC METABOLIC PANEL
Anion gap: 14 (ref 5–15)
BUN: 77 mg/dL — ABNORMAL HIGH (ref 8–23)
CO2: 23 mmol/L (ref 22–32)
Calcium: 7.9 mg/dL — ABNORMAL LOW (ref 8.9–10.3)
Chloride: 106 mmol/L (ref 98–111)
Creatinine, Ser: 2.63 mg/dL — ABNORMAL HIGH (ref 0.61–1.24)
GFR, Estimated: 24 mL/min — ABNORMAL LOW (ref >60)
Glucose, Bld: 361 mg/dL — ABNORMAL HIGH (ref 70–99)
Potassium: 4.6 mmol/L (ref 3.5–5.1)
Sodium: 143 mmol/L (ref 135–145)

## 2021-01-07 LAB — BLOOD GAS, ARTERIAL
Acid-base deficit: 2.4 mmol/L — ABNORMAL HIGH (ref 0.0–2.0)
Bicarbonate: 21.3 mmol/L (ref 20.0–28.0)
FIO2: 50
MECHVT: 610 mL
O2 Saturation: 92.1 %
PEEP: 8 cmH2O
Patient temperature: 98
RATE: 26 {breaths}/min
pCO2 arterial: 35.1 mmHg (ref 32.0–48.0)
pH, Arterial: 7.399 (ref 7.350–7.450)
pO2, Arterial: 64.3 mmHg — ABNORMAL LOW (ref 83.0–108.0)

## 2021-01-07 LAB — CBC
HCT: 44.3 % (ref 39.0–52.0)
Hemoglobin: 14.2 g/dL (ref 13.0–17.0)
MCH: 25.8 pg — ABNORMAL LOW (ref 26.0–34.0)
MCHC: 32.1 g/dL (ref 30.0–36.0)
MCV: 80.4 fL (ref 80.0–100.0)
Platelets: 139 10*3/uL — ABNORMAL LOW (ref 150–400)
RBC: 5.51 MIL/uL (ref 4.22–5.81)
RDW: 16.9 % — ABNORMAL HIGH (ref 11.5–15.5)
WBC: 36.8 10*3/uL — ABNORMAL HIGH (ref 4.0–10.5)
nRBC: 0.8 % — ABNORMAL HIGH (ref 0.0–0.2)

## 2021-01-07 LAB — BASIC METABOLIC PANEL WITH GFR
Anion gap: 13 (ref 5–15)
BUN: 69 mg/dL — ABNORMAL HIGH (ref 8–23)
CO2: 21 mmol/L — ABNORMAL LOW (ref 22–32)
Calcium: 7.8 mg/dL — ABNORMAL LOW (ref 8.9–10.3)
Chloride: 108 mmol/L (ref 98–111)
Creatinine, Ser: 2.51 mg/dL — ABNORMAL HIGH (ref 0.61–1.24)
GFR, Estimated: 25 mL/min — ABNORMAL LOW
Glucose, Bld: 328 mg/dL — ABNORMAL HIGH (ref 70–99)
Potassium: 5.7 mmol/L — ABNORMAL HIGH (ref 3.5–5.1)
Sodium: 142 mmol/L (ref 135–145)

## 2021-01-07 LAB — GLUCOSE, CAPILLARY
Glucose-Capillary: 281 mg/dL — ABNORMAL HIGH (ref 70–99)
Glucose-Capillary: 293 mg/dL — ABNORMAL HIGH (ref 70–99)
Glucose-Capillary: 298 mg/dL — ABNORMAL HIGH (ref 70–99)
Glucose-Capillary: 315 mg/dL — ABNORMAL HIGH (ref 70–99)
Glucose-Capillary: 320 mg/dL — ABNORMAL HIGH (ref 70–99)
Glucose-Capillary: 322 mg/dL — ABNORMAL HIGH (ref 70–99)
Glucose-Capillary: 325 mg/dL — ABNORMAL HIGH (ref 70–99)

## 2021-01-07 LAB — LACTIC ACID, PLASMA
Lactic Acid, Venous: 3.3 mmol/L (ref 0.5–1.9)
Lactic Acid, Venous: 3.2 mmol/L (ref 0.5–1.9)

## 2021-01-07 LAB — TRIGLYCERIDES: Triglycerides: 255 mg/dL — ABNORMAL HIGH (ref <150)

## 2021-01-07 MED ORDER — FENTANYL 2500MCG IN NS 250ML (10MCG/ML) PREMIX INFUSION
25.0000 ug/h | INTRAVENOUS | Status: DC
  Administered 2021-01-07 – 2021-01-09 (×2): 25 ug/h via INTRAVENOUS
  Administered 2021-01-09 – 2021-01-10 (×2): 50 ug/h via INTRAVENOUS
  Administered 2021-01-10: 200 ug/h via INTRAVENOUS
  Administered 2021-01-11 – 2021-01-12 (×2): 175 ug/h via INTRAVENOUS
  Filled 2021-01-07 (×8): qty 250

## 2021-01-07 MED ORDER — VANCOMYCIN VARIABLE DOSE PER UNSTABLE RENAL FUNCTION (PHARMACIST DOSING): Status: DC

## 2021-01-07 MED ORDER — FENTANYL CITRATE (PF) 100 MCG/2ML IJ SOLN
25.0000 ug | Freq: Once | INTRAMUSCULAR | Status: AC
  Administered 2021-01-07: 25 ug via INTRAVENOUS
  Filled 2021-01-07: qty 2

## 2021-01-07 MED ORDER — SODIUM CHLORIDE 0.9 % IV SOLN: INTRAVENOUS | Status: DC

## 2021-01-07 MED ORDER — MIDAZOLAM HCL 2 MG/2ML IJ SOLN
INTRAMUSCULAR | Status: AC
  Administered 2021-01-07: 2 mg via INTRAVENOUS
  Filled 2021-01-07: qty 2

## 2021-01-07 MED ORDER — DOCUSATE SODIUM 50 MG/5ML PO LIQD: 100.0000 mg | Freq: Two times a day (BID) | ORAL | Status: DC

## 2021-01-07 MED ORDER — VANCOMYCIN HCL 2000 MG/400ML IV SOLN
2000.0000 mg | Freq: Once | INTRAVENOUS | Status: AC
  Administered 2021-01-07: 2000 mg via INTRAVENOUS
  Filled 2021-01-07: qty 400

## 2021-01-07 MED ORDER — PIPERACILLIN-TAZOBACTAM 3.375 G IVPB 30 MIN: 3.3750 g | Freq: Three times a day (TID) | INTRAVENOUS | Status: DC

## 2021-01-07 MED ORDER — DEXTROSE IN LACTATED RINGERS 5 % IV SOLN: INTRAVENOUS | Status: DC

## 2021-01-07 MED ORDER — MIDAZOLAM HCL 2 MG/2ML IJ SOLN: 2.0000 mg | Freq: Once | INTRAMUSCULAR | Status: AC

## 2021-01-07 MED ORDER — SODIUM CHLORIDE 0.9 % IV BOLUS
500.0000 mL | Freq: Once | INTRAVENOUS | Status: AC
  Administered 2021-01-07: 500 mL via INTRAVENOUS

## 2021-01-07 MED ORDER — SODIUM CHLORIDE 0.9 % IV SOLN
2.0000 g | Freq: Two times a day (BID) | INTRAVENOUS | Status: DC
  Administered 2021-01-07: 2 g via INTRAVENOUS
  Filled 2021-01-07 (×2): qty 2

## 2021-01-07 MED ORDER — POLYETHYLENE GLYCOL 3350 17 G PO PACK: 17.0000 g | PACK | Freq: Every day | ORAL | Status: DC

## 2021-01-07 MED ORDER — PIPERACILLIN-TAZOBACTAM 3.375 G IVPB
3.3750 g | Freq: Three times a day (TID) | INTRAVENOUS | Status: DC
  Administered 2021-01-07 – 2021-01-14 (×20): 3.375 g via INTRAVENOUS
  Filled 2021-01-07 (×19): qty 50

## 2021-01-07 MED ORDER — LEVETIRACETAM IN NACL 500 MG/100ML IV SOLN
500.0000 mg | Freq: Two times a day (BID) | INTRAVENOUS | Status: DC
  Administered 2021-01-07 – 2021-01-08 (×2): 500 mg via INTRAVENOUS
  Filled 2021-01-07 (×2): qty 100

## 2021-01-07 MED ORDER — FENTANYL BOLUS VIA INFUSION
25.0000 ug | INTRAVENOUS | Status: DC | PRN
  Administered 2021-01-09 – 2021-01-10 (×2): 25 ug via INTRAVENOUS
  Filled 2021-01-07: qty 25

## 2021-01-07 NOTE — Progress Notes (Signed)
New cyanosis found in lower extremities bilaterally. Marked with skin marker, will continue to monitor. New twitching/tremor noted in right upper extremity. Left carotid pulse +4 visibly bounding, right carotid pulse +2 palpable. E-link notified of new findings.

## 2021-01-07 NOTE — Progress Notes (Signed)
Sent ordered sputum sample to Lab.

## 2021-01-07 NOTE — Progress Notes (Addendum)
ERROR"documentaion in wrong chart

## 2021-01-07 NOTE — TOC Progression Note (Signed)
Transition of Care [facility]) - Progression Note    Patient Details  Name: CARTER KASSEL MRN: [id number] Date of Birth: 1941-05-24  [facility]) CM/SW Contact  Leeroy Cha, RN Phone Number: 01/07/2021, 8:31 AM  Clinical Narrative:    Patient got worse overnight. On chart review this morning, noted that patient was increasingly tachypneic.  Was requiring more oxygen than yesterday. Creatinine worsening. I was concerned that patient is getting tired out and could code soon. PCCM consulted. Patient was electively intubated Patient transferred to icu and intubated, unable to determine plan at this time.       Expected Discharge Plan and Services                                                 Social Determinants of Health (SDOH) Interventions    Readmission Risk Interventions No flowsheet data found.

## 2021-01-07 NOTE — Progress Notes (Signed)
Progress Note     Subjective: Patient sedated on the vent. NGT was placed yesterday and mostly air decompressed. Abdomen reportedly more distended this AM and patient seems more ttp. No bowel function. Worsening renal failure.   Objective: Vital signs in last 24 hours: Temp:  [97.7 F (36.5 C)-100.4 F (38 C)] 97.7 F (36.5 C) (02/18 0800) Pulse Rate:  [79-120] 79 (02/18 0800) Resp:  [0-32] 32 (02/18 0800) BP: (123-188)/(59-94) 148/94 (02/18 0800) SpO2:  [86 %-97 %] 93 % (02/18 0858) FiO2 (%):  [50 %-60 %] 60 % (02/18 0858) Last BM Date: 01/03/21  Intake/Output from previous day: 02/17 0701 - 02/18 0700 In: 911.4 [I.V.:911.4] Out: 350 [Urine:350] Intake/Output this shift: No intake/output data recorded.  PE: General: WD, obese male who is intubated/sedated and in critical condition  Heart: regular, rate, and rhythm.  Normal s1,s2. No obvious murmurs, gallops, or rubs noted.  Changes of hypoperfusion noted in distal extremities Lungs: on the vent, bilateral rhonchi  Abd: soft, distended, generalized ttp with grimacing and involuntary guarding, no BS on auscultation, NGT repositioned, minimal bilious drainage from NGT   Lab Results:  Recent Labs    01/06/21 0258 01/07/21 0522  WBC 31.8* 36.8*  HGB 14.4 14.2  HCT 44.3 44.3  PLT 220 139*   BMET Recent Labs    01/07/21 0522 01/07/21 0757  NA 142 143  K 5.7* 4.6  CL 108 106  CO2 21* 23  GLUCOSE 328* 361*  BUN 69* 77*  CREATININE 2.51* 2.63*  CALCIUM 7.8* 7.9*   PT/INR No results for input(s): LABPROT, INR in the last 72 hours. CMP     Component Value Date/Time   NA 143 01/07/2021 0757   NA 133 (L) 12/29/2020 1123   K 4.6 01/07/2021 0757   CL 106 01/07/2021 0757   CO2 23 01/07/2021 0757   GLUCOSE 361 (H) 01/07/2021 0757   BUN 77 (H) 01/07/2021 0757   BUN 36 (H) 12/29/2020 1123   CREATININE 2.63 (H) 01/07/2021 0757   CREATININE 1.20 (H) 08/05/2020 1345   CALCIUM 7.9 (L) 01/07/2021 0757   PROT 6.0  (L) 01/06/2021 0258   PROT 6.5 12/29/2020 1123   ALBUMIN 3.1 (L) 01/06/2021 0258   ALBUMIN 4.3 12/29/2020 1123   AST 78 (H) 01/06/2021 0258   ALT 73 (H) 01/06/2021 0258   ALKPHOS 164 (H) 01/06/2021 0258   BILITOT 1.6 (H) 01/06/2021 0258   BILITOT 0.5 12/29/2020 1123   GFRNONAA 24 (L) 01/07/2021 0757   GFRNONAA 58 (L) 08/05/2020 1345   GFRAA 48 (L) 12/29/2020 1123   GFRAA 67 08/05/2020 1345   Lipase  No results found for: LIPASE     Studies/Results: DG Abd 1 View  Result Date: 01/06/2021 CLINICAL DATA:  NG tube placement EXAM: ABDOMEN - 1 VIEW COMPARISON:  01/06/2021 FINDINGS: NG tube is in place with the tip in the stomach. IMPRESSION: NG tube in the stomach. Electronically Signed   By: Rolm Baptise M.D.   On: 01/06/2021 10:47   US Abdomen Complete  Result Date: 01/07/2021 CLINICAL DATA:  Acute kidney injury. EXAM: ABDOMEN ULTRASOUND COMPLETE COMPARISON:  None. FINDINGS: Gallbladder: Cholelithiasis is noted without significant gallbladder wall thickening or pericholecystic fluid. No sonographic Murphy's sign is noted. Common bile duct: Diameter: 6 mm which is within normal limits. Liver: No focal lesion identified. Within normal limits in parenchymal echogenicity. Portal vein is patent on color Doppler imaging with normal direction of blood flow towards the liver. IVC: No abnormality visualized.  Pancreas: Not visualized due to overlying bowel gas. Spleen: Size and appearance within normal limits. Right Kidney: Length: 12.6 cm. 6 cm simple cyst is noted. Echogenicity within normal limits. No mass or hydronephrosis visualized. Left Kidney: Length: 11.5 cm. 1.6 cm simple cyst is noted. Echogenicity within normal limits. No mass or hydronephrosis visualized. Abdominal aorta: Not visualized. Other findings: None. IMPRESSION: Cholelithiasis without evidence of cholecystitis. Bilateral simple renal cysts are noted. Pancreas and abdominal aorta are not visualized. Electronically Signed   By: Marijo Conception M.D.   On: 01/07/2021 10:18   DG Chest Port 1 View  Result Date: 01/07/2021 CLINICAL DATA:  Hypoxia.  COVID-19 positive EXAM: PORTABLE CHEST 1 VIEW COMPARISON:  January 06, 2021. FINDINGS: Endotracheal tube tip is 4.7 cm above the carina. Nasogastric tube tip and side port are below the diaphragm. No pneumothorax. There is airspace opacity throughout the mid and lower lung regions bilaterally, similar to 1 day prior. No new opacity evident. Heart is upper normal in size with pulmonary vascularity normal. No adenopathy. No bone lesions. IMPRESSION: Tube positions as described without pneumothorax. Multifocal airspace opacity persists, consistent with bilateral multifocal pneumonia. A degree of superimposed pulmonary edema cannot be entirely excluded by radiography. Stable cardiac silhouette. Electronically Signed   By: Lowella Grip III M.D.   On: 01/07/2021 08:36   DG CHEST PORT 1 VIEW  Result Date: 01/06/2021 CLINICAL DATA:  NG tube placement EXAM: PORTABLE CHEST 1 VIEW COMPARISON:  01/06/2021 FINDINGS: Endotracheal tube is 5 cm above the carina. NG tube enters the stomach. Severe diffuse bilateral airspace disease, similar to prior study. No visible effusions or pneumothorax. No acute bony abnormality. IMPRESSION: Support devices in expected position as above. Stable severe diffuse bilateral airspace disease. Electronically Signed   By: Rolm Baptise M.D.   On: 01/06/2021 10:47   DG CHEST PORT 1 VIEW  Result Date: 01/06/2021 CLINICAL DATA:  Hypoxia, COVID-19 positive EXAM: PORTABLE CHEST 1 VIEW COMPARISON:  Portable exam 0831 hours compared to 01/05/2021 FINDINGS: Stable heart size and mediastinal contours. Extensive BILATERAL airspace infiltrates consistent with multifocal pneumonia and COVID-19, slightly increased since previous exam. No pleural effusion or pneumothorax. Osseous structures unremarkable. IMPRESSION: Slightly increased BILATERAL pulmonary infiltrates consistent with  multifocal pneumonia and COVID-19. Electronically Signed   By: Lavonia Dana M.D.   On: 01/06/2021 10:26   DG Abd Portable 1V  Result Date: 01/06/2021 CLINICAL DATA:  Small-bowel obstruction. EXAM: PORTABLE ABDOMEN - 1 VIEW COMPARISON:  01/05/2021. FINDINGS: Persistent distended loops of small bowel noted in the mid abdomen. Paucity of colonic gas. No free air. Degenerative changes scoliosis lumbar spine. Bibasilar atelectasis. IMPRESSION: Persistent distended loops of small bowel noted in the mid abdomen again concerning for small bowel obstruction. Paucity of colonic gas. No free air. Electronically Signed   By: Marcello Moores  Register   On: 01/06/2021 07:09   ECHOCARDIOGRAM COMPLETE  Result Date: 01/06/2021    ECHOCARDIOGRAM REPORT   Patient Name:   Thomas Chung Date of Exam: 01/06/2021 Medical Rec #:  [id number]        Height:       76.0 in Accession #:    [id number]       Weight:       252.4 lb Date of Birth:  Mar 28, 1941        BSA:          2.446 m Patient Age:    81 years         BP:  151/77 mmHg Patient Gender: M                HR:           117 bpm. Exam Location:  Inpatient Procedure: 2D Echo, Cardiac Doppler and Color Doppler Indications:    I50.40* Unspecified combined systolic (congestive) and diastolic                 (congestive) heart failure  History:        Patient has no prior history of Echocardiogram examinations.                 Abnormal ECG, Signs/Symptoms:Shortness of Breath; Risk                 Factors:Hypertension, Diabetes and Dyslipidemia. Covid positive.  Sonographer:    Roseanna Rainbow RDCS Referring Phys: [id number] [facility]  Sonographer Comments: Echo performed with patient supine and on artificial respirator and Technically difficult study due to poor echo windows. Image acquisition challenging due to patient body habitus. IMPRESSIONS  1. Left ventricular ejection fraction, by estimation, is 55 to 60%. The left ventricle has normal function. The left ventricle has no regional  wall motion abnormalities. 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 Indeterminate diastolic filling due to E-A fusion.  2. Right ventricular systolic function is normal. The right ventricular size is mildly enlarged. Tricuspid regurgitation signal is inadequate for assessing PA pressure.  3. The mitral valve is grossly normal. Trivial mitral valve regurgitation. No evidence of mitral stenosis.  4. The aortic valve is tricuspid. There is mild calcification of the aortic valve. There is mild thickening of the aortic valve. Aortic valve regurgitation is trivial. Mild aortic valve sclerosis is present, with no evidence of aortic valve stenosis. Comparison(s): No prior Echocardiogram. Conclusion(s)/Recommendation(s): Normal LVEF, but prominent basal septal hypertrophy. FINDINGS  Left Ventricle: Basal septum protrudes into LV cavity, otherwise moderate concentric LVH. Left ventricular ejection fraction, by estimation, is 55 to 60%. The left ventricle has normal function. The left ventricle has no regional wall motion abnormalities. The left ventricular internal cavity size was normal in size. 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 Indeterminate diastolic filling due to E-A fusion. Right Ventricle: The right ventricular size is mildly enlarged. Right vetricular wall thickness was not well visualized. Right ventricular systolic function is normal. Tricuspid regurgitation signal is inadequate for assessing PA pressure. Left Atrium: Left atrial size was normal in size. Right Atrium: Right atrial size was normal in size. Pericardium: There is no evidence of pericardial effusion. Mitral Valve: The mitral valve is grossly normal. There is mild thickening of the mitral valve leaflet(s). There is mild calcification of the mitral valve leaflet(s). Trivial mitral valve regurgitation. No evidence of mitral valve stenosis. MV peak gradient, 9.6  mmHg. The mean mitral valve gradient is 3.0 mmHg. Tricuspid Valve: The tricuspid valve is normal in structure. Tricuspid valve regurgitation is trivial. No evidence of tricuspid stenosis. Aortic Valve: The aortic valve is tricuspid. There is mild calcification of the aortic valve. There is mild thickening of the aortic valve. Aortic valve regurgitation is trivial. Mild aortic valve sclerosis is present, with no evidence of aortic valve stenosis. Pulmonic Valve: The pulmonic valve was grossly normal. Pulmonic valve regurgitation is not visualized. No evidence of pulmonic stenosis. Aorta: The aortic root, ascending aorta and aortic arch are all structurally normal, with no evidence of dilitation or obstruction. Venous: IVC assessment for right atrial pressure  unable to be performed due to mechanical ventilation. IAS/Shunts: The atrial septum is grossly normal.  LEFT VENTRICLE PLAX 2D LVIDd:         3.80 cm      Diastology LVIDs:         2.50 cm      LV e' medial:    7.09 cm/s LV PW:         1.60 cm      LV E/e' medial:  11.1 LV IVS:        2.90 cm      LV e' lateral:   7.46 cm/s LVOT diam:     2.30 cm      LV E/e' lateral: 10.5 LV SV:         81 LV SV Index:   33 LVOT Area:     4.15 cm  LV Volumes (MOD) LV vol d, MOD A2C: 114.0 ml LV vol d, MOD A4C: 91.4 ml LV vol s, MOD A2C: 48.5 ml LV vol s, MOD A4C: 35.1 ml LV SV MOD A2C:     65.5 ml LV SV MOD A4C:     91.4 ml LV SV MOD BP:      62.1 ml RIGHT VENTRICLE RV S prime:     30.80 cm/s TAPSE (M-mode): 2.8 cm LEFT ATRIUM             Index       RIGHT ATRIUM           Index LA diam:        4.30 cm 1.76 cm/m  RA Area:     10.50 cm LA Vol (A2C):   31.7 ml 12.96 ml/m RA Volume:   15.90 ml  6.50 ml/m LA Vol (A4C):   32.9 ml 13.45 ml/m LA Biplane Vol: 35.8 ml 14.64 ml/m  AORTIC VALVE             PULMONIC VALVE LVOT Vmax:   119.00 cm/s PR End Diast Vel: 1.32 msec LVOT Vmean:  86.500 cm/s LVOT VTI:    0.195 m  AORTA Ao Root diam: 3.90 cm MITRAL VALVE MV Area (PHT): 6.17 cm      SHUNTS MV Area VTI:   4.13 cm     Systemic VTI:  0.20 m MV Peak grad:  9.6 mmHg     Systemic Diam: 2.30 cm MV Mean grad:  3.0 mmHg MV Vmax:       1.55 m/s MV Vmean:      69.0 cm/s MV Decel Time: 123 msec MV E velocity: 78.50 cm/s MV A velocity: 125.00 cm/s MV E/A ratio:  0.63 Buford Dresser MD Electronically signed by Buford Dresser MD Signature Date/Time: 01/06/2021/9:22:00 PM    Final     Anti-infectives: Anti-infectives (From admission, onward)   Start     Dose/Rate Route Frequency Ordered Stop   01/07/21 1030  vancomycin (VANCOREADY) IVPB 2000 mg/400 mL        2,000 mg 200 mL/hr over 120 Minutes Intravenous  Once 01/07/21 0929     01/07/21 1000  ceFEPIme (MAXIPIME) 2 g in sodium chloride 0.9 % 100 mL IVPB        2 g 200 mL/hr over 30 Minutes Intravenous Every 12 hours 01/07/21 0928     01/07/21 0930  vancomycin variable dose per unstable renal function (pharmacist dosing)         Does not apply See admin instructions 01/07/21 0930     01/02/21 1000  remdesivir 100  mg in sodium chloride 0.9 % 100 mL IVPB       "Followed by" Linked Group Details   100 mg 200 mL/hr over 30 Minutes Intravenous Daily 01/13/2021 1845 01/05/21 0921   01/12/2021 2000  remdesivir 200 mg in sodium chloride 0.9% 250 mL IVPB       "Followed by" Linked Group Details   200 mg 580 mL/hr over 30 Minutes Intravenous Once 01/12/2021 1845 12/31/2020 2017       Assessment/Plan COVID pneumonia with VDRF with likely HCAP as well  - intubated yesterday  - solumedrol/remdexivir/tocilizumab - started on vanc/cefepime  - WBC 10.9>26.1>31.8>36.8 Worsening renal failure - Cr 2.63 from 2.51 yesterday  Peripheral vascular compromise - hypoperfusion noted  Pancreaticinsufficiency - On Creon at home, currently on hold Type 2 diabetes Hypertension Hyperlipidemia Gout Hx B12 deficiency 20 pack yr hx of tobacco use, none since age 58   Ileus vs SBO - no prior abdominal surgeries - minimal output from NGT  but some bilious drainage in tubing - initially could't get NGT to aspirate but adjusted position and then seemed to be draining better - peritonitis on exam  - CT A/P ordered, will follow up results of this  - patient is a a poor operative candidate overall with worsening respiratory and renal failure. If CT shows something operative will need to have a discussion regarding [place] with patient's family   FEN: NPO, IVF; NGT to LIWS ID: tocilizumab 2/16; remdesivir 2/12-2/16; vanc/cefepime 2/18>> DVT: Lovenox 60 mg q24 hrs   LOS: 6 days    Norm Parcel , [facility] 01/07/2021, 10:43 AM Please see Amion for pager number during day hours 7:00am-4:30pm

## 2021-01-07 NOTE — Progress Notes (Signed)
Date and time results received: 01/07/21  0910  Test: Lactic Acid  Critical Value: 3.3  Name of Provider Notified: W. Minor, NP  Orders Received? Or Actions Taken?: Will continue to monitor

## 2021-01-07 NOTE — Progress Notes (Signed)
PCCM Interval Note  Called to bedside by RN for abnormal body movements/shaking/shivering for at least 3 minutes. Bilateral with right predominant shaking noted on exam. Pupils unchanged. Resolved with versed x 2mg .   Consulted Neuro. Plan for CT head STAT, keppra BID. Ok to continue propofol.  Rodman Pickle, M.D. [facility] Pulmonary/Critical Care Medicine 01/07/2021 4:59 PM

## 2021-01-07 NOTE — Progress Notes (Signed)
Initial Nutrition Assessment  DOCUMENTATION CODES:   Obesity unspecified  INTERVENTION:  - will monitor for GOC/POC and nutrition-related plan.   NUTRITION DIAGNOSIS:   Increased nutrient needs related to acute illness,catabolic illness ([id number] infection) as evidenced by estimated needs.  GOAL:   Provide needs based on ASPEN/SCCM guidelines  MONITOR:   Vent status,TF tolerance,Labs,Weight trends  REASON FOR ASSESSMENT:   Ventilator  ASSESSMENT:   80 y.o. male with medical history of controlled type 2 DM with neuropathy, pancreatic insufficiency, HTN, HLD, and GERD. He presented to the ED with progressive worsening weakness, diarrhea, very poor appetite, cough, and intermittent fever starting on 1/30. He tested positive for COVID as an outpatient.  Patient discussed in rounds this AM.   He was intubated yesterday at ~1100 and NGT placed at that. Abdominal xray report from yesterday states tip of tube is gastric. Per RN, NGT to LIS with bile in tubing.   He was on a Heart Healthy diet from admission date of 2/12 until the time of intubation. The only documented meal intake in the chart is 95% of breakfast on 2/14.   Weight on 2/16 was 252 lb, weight on 2/12 was 256 lb, and PTA the most recently documented weight was on 12/27/20 when he weighed 260 lb. Prior to that, most recent weight was 11/04/20 when he weighed 265 lb. This indicates 13 lb weight loss (5% body weight) in the past 2 months; not significant for time frame.   Notes indicate possible ileus vs SBO. Patient pending CT today. Surgery team note from late this AM states that patient is a very poor surgical candidate. Note also indicates patient with peritonitis.     Patient is currently intubated on ventilator support MV: 15.8 L/min Temp (24hrs), Avg:98.6 F (37 C), Min:97.7 F (36.5 C), Max:100.4 F (38 C) Propofol: 30.9 ml/hr (816 kcal)  Labs reviewed; CBGs: 298, 320, 322 mg/dl, BUN: 77 mg/dl, creatinine:  2.63 mg/dl, Ca: 7.9 mg/dl, GFR: 24 ml/min.  Medications reviewed; 100 mg colace BID, sliding scale novolog, 60 mg solu-medrol BID, 40 mg IV protonix BID, 17 g miralax/day,  IVF; NS @ 75 ml/hr     NUTRITION - FOCUSED PHYSICAL EXAM:  unable to complete at this time.   Diet Order:   Diet Order            Diet NPO time specified  Diet effective midnight                 EDUCATION NEEDS:   Not appropriate for education at this time  Skin:  Skin Assessment: Reviewed RN Assessment  Last BM:  2/14  Height:   Ht Readings from Last 1 Encounters:  01/05/21 6\' 4"  (1.93 m)    Weight:   Wt Readings from Last 1 Encounters:  01/05/21 114.5 kg    Estimated Nutritional Needs:  Kcal:  1960-2254 kcal (20-23 kcal/kg adjBW) Protein:  >/= 150 grams Fluid:  >/= 3 L/day      Jarome Matin, MS, RD, LDN, CNSC Inpatient Clinical Dietitian RD pager # available in AMION  After hours/weekend pager # available in [facility]

## 2021-01-07 NOTE — Progress Notes (Signed)
Inpatient Diabetes Program Recommendations  AACE/ADA: New Consensus Statement on Inpatient Glycemic Control (2015)  Target Ranges:  Prepandial:   less than 140 mg/dL      Peak postprandial:   less than 180 mg/dL (1-2 hours)      Critically ill patients:  140 - 180 mg/dL   Lab Results  Component Value Date   GLUCAP 298 (H) 01/07/2021   HGBA1C 6.7 (H) 11/04/2020    Review of Glycemic Control  Results for Thomas Chung, Thomas Chung" (MRN [id number]) as of 01/07/2021 09:02  Ref. Range 01/06/2021 13:16 01/06/2021 16:18 01/06/2021 19:57 01/07/2021 00:10 01/07/2021 03:15  Glucose-Capillary Latest Ref Range: 70 - 99 mg/dL 291 (H) 284 (H) 280 (H) 281 (H) 298 (H)    Inpatient Diabetes Program Recommendations:    Levemir 10 daily Novolog 0-15 units TID & 0-5 units QHS  Will continue to follow while inpatient.  Thank you, Reche Dixon, RN, BSN Diabetes Coordinator Inpatient Diabetes Program [phone] (team pager from 8a-5p)

## 2021-01-07 NOTE — Progress Notes (Signed)
CT reviewed  segmental enteritis ? Ischemia ? No evidence of perforation  Poor operative candidate overall Cefepime  and continue fluid support and ICU support for now Unlikely to survive intervention if it comes to that at this point

## 2021-01-07 NOTE — Progress Notes (Signed)
Pharmacy Antibiotic Note  Thomas Chung is a 80 y.o. male admitted on 12/31/2020 with COVID pneumonia, completed treatment with remdesivir, s/p actemra 2/16, remains on steroids.  Pharmacy has been consulted for vancomycin and cefepime dosing for possible bacterial pneumonia.  Tmax 100.4 WBC 36.8 (solumedrol) SCr doubling 2.63, CrCl ~30 ml/min Lactate 3.3 Re-culture  Plan: Cefepime 2g IV q12h Vancomycin 2g IV x 1, f/u renal function in AM to determine further maintenance dosing Follow up renal function & cultures  Height: 6\' 4"  (193 cm) Weight: 114.5 kg (252 lb 6.8 oz) IBW/kg (Calculated) : 86.8  Temp (24hrs), Avg:98.6 F (37 C), Min:97.7 F (36.5 C), Max:100.4 F (38 C)  Recent Labs  Lab 01/14/2021 1725 12/30/2020 1726 01/03/21 0253 01/04/21 0421 01/05/21 0438 01/06/21 0258 01/07/21 0522 01/07/21 0757 01/07/21 0801  WBC  --    < > 6.8 10.9* 26.1* 31.8* 36.8*  --   --   CREATININE  --    < > 1.11 1.02 1.08 1.41* 2.51* 2.63*  --   LATICACIDVEN 1.4  --   --   --   --   --   --   --  3.3*   < > = values in this interval not displayed.    Estimated Creatinine Clearance: 31.5 mL/min (A) (by C-G formula based on SCr of 2.63 mg/dL (H)).    Allergies  Allergen Reactions  . Amlodipine Swelling    Swelling in legs; no shortness of breath or tongue swelling.  Marland Kitchen Prevnar [Pneumococcal 13-Val Conj Vacc]     Joint pain, swelling    Antimicrobials this admission:  2/12 Remdesivir >> 2/16 2/16 Actemra 2/18 Vanc >> 2/18 Cefepime >>  Microbiology results:  2/12 COVID+ 2/12 BCx: NGF 2/16 MRSA: neg 2/18 BCx: 2/18 UCx: 2/18 TA: Thank you for allowing pharmacy to be a part of this patient's care.  Peggyann Juba, PharmD, BCPS Pharmacy: [phone] 01/07/2021 9:31 AM

## 2021-01-07 NOTE — Progress Notes (Signed)
[facility] Progress Note Patient Name: Thomas Chung DOB: 01/02/1941 MRN: [id number]   Date of Service  01/07/2021  HPI/Events of Note  Patient with abnormal CT abdomen with area concerning for ischemic enteritis with pneumatosis.  eICU Interventions  I discussed the patient with Dr. Brantley Stage, general surgery on call for [facility] (see general surgery note). Plan is to keep patient NPO and continue antibiotic Rx. Will switch patient to Zosyn.        Kerry Kass Julis Haubner 01/07/2021, 8:05 PM

## 2021-01-07 NOTE — Consult Note (Addendum)
NAME:  Thomas Chung, MRN:  [id number], DOB:  09-Apr-1941, LOS: 6 ADMISSION DATE:  12/24/2020, CONSULTATION DATE: 01/06/2019 REFERRING MD: Triad, CHIEF COMPLAINT: Acute hypoxic respiratory failure  Brief History:  80 year old with history of Covid on 12/22/2020 who defervesced and required intubation on 01/06/2021  History of Present Illness:  80 year old former smoker quit when he was 42 he has a past medical history for Covid on 12/22/2020, along with type 2 diabetes with neuropathy pancreatic insufficiency hypertension history of gastroesophageal reflux disease.  He was admitted on 01/07/2021 with worsening respiratory distress and suspected ileus.  On 01/04/2021 had increasing oxygen demand demands and by 01/06/2021 was on 100% 35 L flow of oxygen pulmonary critical care was asked to evaluate and he was intubated at that time.  He has completed antiviral therapy.  He remains on Solu-Medrol and Acterma.  Currently is on full mechanical ventilatory support ARDS protocol his wife and daughter have been updated via the phone.  Past Medical History:   Past Medical History:  Diagnosis Date  . Depression   . Fatty liver   . Glucose intolerance (impaired glucose tolerance)   . HOH (hard of hearing)    bilateral hearing aides  . Hx of colonic polyps   . Hyperlipidemia   . Hypertension   . Neuromuscular disorder ([place])    neuropathy feet    Significant Hospital Events:  01/06/2021 intubated  Consults:  General surgery Critical care  Procedures:  01/06/2021 intubation 17 2022 NG tube left nares>>  Significant Diagnostic Tests:  2/18 ct abd>>  Micro Data:  01/07/2020 sputum>> 01/07/2021 blood cultures x2>> 01/07/2021 urine culture>>  Antimicrobials:  01/07/2021 vancomycin>> 01/07/2021 cefepime>>  Interim History / Subjective:  Now intubated on 60% FiO2 Increasing creatinine and decreasing urine output Decreased vascularization lower extremities toes  Objective   Blood pressure  134/64, pulse 85, temperature 98 F (36.7 C), temperature source Oral, resp. rate (!) 22, height 6\' 4"  (1.93 m), weight 114.5 kg, SpO2 97 %.    Vent Mode: PRVC FiO2 (%):  [50 %-70 %] 60 % Set Rate:  [26 bmp] 26 bmp Vt Set:  [610 mL] 610 mL PEEP:  [8 cmH20] 8 cmH20 Plateau Pressure:  [16 cmH20-24 cmH20] 16 cmH20   Intake/Output Summary (Last 24 hours) at 01/07/2021 0850 Last data filed at 01/07/2021 0700 Gross per 24 hour  Intake 911.37 ml  Output 350 ml  Net 561.37 ml   Filed Weights   01/04/2021 2125 01/10/2021 2306 01/05/21 1141  Weight: 116.3 kg 116.3 kg 114.5 kg    Examination: General: Elderly male heavily sedated on full mechanical ventilatory support port HEENT: Endotracheal tube and nasogastric tube well-positioned no JVD or lymphadenopathy is appreciated Neuro: Currently heavily sedated.  Upon abdominal exam grimaces with pressure and palpation to abdomen CV: Heart sounds are regular PULM: Creased breath sounds throughout Vent pressure regulated volume control ARDS mode FIO2 60% PEEP 8 RATE 26+3 VT 6/10 which equates to 8 cc/kg  GI: Distended despite NG tube, no bowel sounds GU: Scant urine Extremities: Lower extremities with vascular status changes  Rt foot 2/18  Lt foot 2/18    Skin: no rashes or lesions   Resolved Hospital Problem list     Assessment & Plan:  Ventilator dependent respiratory failure in the setting of Covid hypoxia bilateral airspace disease refractory to prior treatment.  Tested positive for Covid on 12/22/2020 he was unvaccinated for COVID-19. Continue actemra and steroids AR DS protocol Wean per protocol Concern for underlying  bacterial pneumonia with increasing white count fever spikes during the night therefore we will start empirical antimicrobial therapy with vancomycin and cefepime at this time.  Worsening renal failure Lab Results  Component Value Date   CREATININE 2.51 (H) 01/07/2021   CREATININE 1.41 (H) 01/06/2021    CREATININE 1.08 01/05/2021   CREATININE 1.20 (H) 08/05/2020   Abdominal ultrasound involving kidneys ordered 01/07/2021 Avoid nephrotoxins Suspect he may be intravascularly dry therefore will give fluid challenge Bladder scan showed no urine in the bladder Poor urine output over the last 24 hours Fluid challenge  Hypokalemia Recent Labs  Lab 01/05/21 0438 01/06/21 0258 01/07/21 0522  K 4.1 4.3 5.7*   In the setting of worsening renal failure Repeat potassium was 4.7   Peripheral vascular compromise with bilateral lower extremities with blue toes noted Areas of hypoperfusion are marked and will be monitored  COVID-19 diagnosed 12/22/2020 completed antiviral therapy currently on Actemra and Solu-Medrol. Continue above treatments  Hypertension He is now off Cardene Continue to monitor blood pressure  Diabetes mellitus type 2 CBG (last 3)  Recent Labs    01/06/21 1957 01/07/21 0010 01/07/21 0315  GLUCAP 280* 281* 298*   Sliding-scale insulin protocol  Suspected small bowel obstruction/ileus January 07, 2021 abdomen more distended and tender despite being sedated and NG tube have been placed on 01/06/2019  CT of the abdomen w/o contrast  General surgery is following Increasing lactic acid concerning for obstruction vascular compromise.   History of pancreatic insufficiency Creon is on hold   Best practice (evaluated daily)  Diet: npo due to ileus Pain/Anxiety/Delirium protocol (if indicated): As needed VAP protocol (if indicated): If intubated DVT prophylaxis: PPI Glucose control: Sliding scale insulin protocol Mobility: Bedrest Disposition: Intensive care unit 01/07/2021 daughter Deedra Ehrich at [phone] updated at length and she will update her mother.  Goals of Care:  Last date of multidisciplinary goals of care discussion: 01/06/2021 via phone Family and staff present: 01/06/2021 spoke with wife and daughter made them aware of impending intubation.Updated  in person 2/17 per MD Summary of discussion: Agree they want intubation if necessary. Follow up goals of care discussion due:2/24 Code Status: Full code  Labs   CBC: Recent Labs  Lab 01/02/21 0434 01/03/21 0253 01/04/21 0421 01/05/21 0438 01/06/21 0258 01/07/21 0522  WBC 3.8* 6.8 10.9* 26.1* 31.8* 36.8*  NEUTROABS 3.1 5.9 9.6* 21.9* 27.7*  --   HGB 13.8 13.8 14.1 15.1 14.4 14.2  HCT 42.4 42.3 43.8 46.4 44.3 44.3  MCV 78.4* 78.6* 79.1* 78.0* 78.0* 80.4  PLT 280 339 354 290 220 139*    Basic Metabolic Panel: Recent Labs  Lab 01/03/21 0253 01/04/21 0421 01/05/21 0438 01/06/21 0258 01/07/21 0522  NA 136 138 138 142 142  K 4.0 4.1 4.1 4.3 5.7*  CL 103 105 105 108 108  CO2 23 24 22 26  21*  GLUCOSE 176* 171* 192* 268* 328*  BUN 37* 34* 35* 41* 69*  CREATININE 1.11 1.02 1.08 1.41* 2.51*  CALCIUM 8.0* 8.0* 8.1* 8.0* 7.8*  MG  --   --   --  2.7*  --   PHOS  --   --   --  2.3*  --    GFR: Estimated Creatinine Clearance: 33 mL/min (A) (by C-G formula based on SCr of 2.51 mg/dL (H)). Recent Labs  Lab 01/08/2021 1725 12/21/2020 1726 01/02/21 0434 01/04/21 0421 01/05/21 0438 01/06/21 0258 01/06/21 0916 01/07/21 0522  PROCALCITON  --  <0.10  --   --   --   --  0.43  --   WBC  --  8.3   < > 10.9* 26.1* 31.8*  --  36.8*  LATICACIDVEN 1.4  --   --   --   --   --   --   --    < > = values in this interval not displayed.    Liver Function Tests: Recent Labs  Lab 01/02/21 0434 01/03/21 0253 01/04/21 0421 01/05/21 0438 01/06/21 0258  AST 47* 54* 60* 78* 78*  ALT 32 41 49* 62* 73*  ALKPHOS 51 50 61 118 164*  BILITOT 0.8 0.8 0.7 1.0 1.6*  PROT 6.3* 5.9* 5.8* 6.2* 6.0*  ALBUMIN 3.3* 3.1* 3.1* 3.3* 3.1*   No results for input(s): LIPASE, AMYLASE in the last 168 hours. No results for input(s): AMMONIA in the last 168 hours.  ABG    Component Value Date/Time   PHART 7.399 01/07/2021 0520   PCO2ART 35.1 01/07/2021 0520   PO2ART 64.3 (L) 01/07/2021 0520   HCO3 21.3  01/07/2021 0520   ACIDBASEDEF 2.4 (H) 01/07/2021 0520   O2SAT 92.1 01/07/2021 0520     Coagulation Profile: No results for input(s): INR, PROTIME in the last 168 hours.  Cardiac Enzymes: No results for input(s): CKTOTAL, CKMB, CKMBINDEX, TROPONINI in the last 168 hours.  HbA1C: Hgb A1c MFr Bld  Date/Time Value Ref Range Status  11/04/2020 02:29 PM 6.7 (H) 4.6 - 6.5 % Final    Comment:    Glycemic Control Guidelines for People with Diabetes:Non Diabetic:  <6%Goal of Therapy: <7%Additional Action Suggested:  >8%   08/05/2020 01:45 PM 6.5 (H) <5.7 % of total Hgb Final    Comment:    For someone without known diabetes, a hemoglobin A1c value of 6.5% or greater indicates that they may have  diabetes and this should be confirmed with a follow-up  test. . For someone with known diabetes, a value <7% indicates  that their diabetes is well controlled and a value  greater than or equal to 7% indicates suboptimal  control. A1c targets should be individualized based on  duration of diabetes, age, comorbid conditions, and  other considerations. . Currently, no consensus exists regarding use of hemoglobin A1c for diagnosis of diabetes for children. .     CBG: Recent Labs  Lab 01/06/21 1316 01/06/21 1618 01/06/21 1957 01/07/21 0010 01/07/21 0315  GLUCAP 291* 284* 280* 281* 298*      Critical care time: 35 min      Richardson Landry Kaisley Stiverson ACNP Acute Care Nurse Practitioner [place] Please consult Amion 01/07/2021, 8:50 AM

## 2021-01-07 NOTE — Progress Notes (Signed)
Chaplain engaged in initial visit with Mrs. Utke by phone.  Chaplain introduced herself and offered support.  Mrs. Bayron asked chaplain about the possibility of her coming and standing outside of [facility] room.  Chaplain stated she would ask his nurse or the director of the unit.  She expressed that her daughter had been able to do that and now she wishes to be able to see him as well.  Chaplain will follow-up.    01/07/21 1300  Clinical Encounter Type  Visited With Family  Visit Type Initial

## 2021-01-08 ENCOUNTER — Inpatient Hospital Stay (HOSPITAL_COMMUNITY): Payer: Medicare HMO

## 2021-01-08 DIAGNOSIS — R569 Unspecified convulsions: Secondary | ICD-10-CM

## 2021-01-08 DIAGNOSIS — R0989 Other specified symptoms and signs involving the circulatory and respiratory systems: Secondary | ICD-10-CM | POA: Diagnosis not present

## 2021-01-08 DIAGNOSIS — R209 Unspecified disturbances of skin sensation: Secondary | ICD-10-CM | POA: Diagnosis not present

## 2021-01-08 DIAGNOSIS — U071 COVID-19: Secondary | ICD-10-CM | POA: Diagnosis not present

## 2021-01-08 DIAGNOSIS — J069 Acute upper respiratory infection, unspecified: Secondary | ICD-10-CM | POA: Diagnosis not present

## 2021-01-08 LAB — GLUCOSE, CAPILLARY
Glucose-Capillary: 298 mg/dL — ABNORMAL HIGH (ref 70–99)
Glucose-Capillary: 271 mg/dL — ABNORMAL HIGH (ref 70–99)
Glucose-Capillary: 270 mg/dL — ABNORMAL HIGH (ref 70–99)
Glucose-Capillary: 242 mg/dL — ABNORMAL HIGH (ref 70–99)
Glucose-Capillary: 189 mg/dL — ABNORMAL HIGH (ref 70–99)

## 2021-01-08 LAB — CBC WITH DIFFERENTIAL/PLATELET
Abs Immature Granulocytes: 0.86 10*3/uL — ABNORMAL HIGH (ref 0.00–0.07)
Basophils Absolute: 0.1 10*3/uL (ref 0.0–0.1)
Basophils Relative: 0 %
Eosinophils Absolute: 0.1 10*3/uL (ref 0.0–0.5)
Eosinophils Relative: 0 %
HCT: 39.2 % (ref 39.0–52.0)
Hemoglobin: 12.5 g/dL — ABNORMAL LOW (ref 13.0–17.0)
Immature Granulocytes: 3 %
Lymphocytes Relative: 2 %
Lymphs Abs: 0.5 10*3/uL — ABNORMAL LOW (ref 0.7–4.0)
MCH: 25.8 pg — ABNORMAL LOW (ref 26.0–34.0)
MCHC: 31.9 g/dL (ref 30.0–36.0)
MCV: 80.8 fL (ref 80.0–100.0)
Monocytes Absolute: 2.2 10*3/uL — ABNORMAL HIGH (ref 0.1–1.0)
Monocytes Relative: 7 %
Neutro Abs: 30 10*3/uL — ABNORMAL HIGH (ref 1.7–7.7)
Neutrophils Relative %: 88 %
Platelets: 225 10*3/uL (ref 150–400)
RBC: 4.85 MIL/uL (ref 4.22–5.81)
RDW: 16.6 % — ABNORMAL HIGH (ref 11.5–15.5)
WBC: 33.8 10*3/uL — ABNORMAL HIGH (ref 4.0–10.5)
nRBC: 1.1 % — ABNORMAL HIGH (ref 0.0–0.2)

## 2021-01-08 LAB — BASIC METABOLIC PANEL
Anion gap: 13 (ref 5–15)
Anion gap: 14 (ref 5–15)
BUN: 92 mg/dL — ABNORMAL HIGH (ref 8–23)
BUN: 98 mg/dL — ABNORMAL HIGH (ref 8–23)
CO2: 20 mmol/L — ABNORMAL LOW (ref 22–32)
CO2: 21 mmol/L — ABNORMAL LOW (ref 22–32)
Calcium: 7.6 mg/dL — ABNORMAL LOW (ref 8.9–10.3)
Calcium: 7.8 mg/dL — ABNORMAL LOW (ref 8.9–10.3)
Chloride: 109 mmol/L (ref 98–111)
Chloride: 110 mmol/L (ref 98–111)
Creatinine, Ser: 3.1 mg/dL — ABNORMAL HIGH (ref 0.61–1.24)
Creatinine, Ser: 3.16 mg/dL — ABNORMAL HIGH (ref 0.61–1.24)
GFR, Estimated: 20 mL/min — ABNORMAL LOW (ref >60)
GFR, Estimated: 19 mL/min — ABNORMAL LOW (ref >60)
Glucose, Bld: 323 mg/dL — ABNORMAL HIGH (ref 70–99)
Glucose, Bld: 314 mg/dL — ABNORMAL HIGH (ref 70–99)
Potassium: 5.2 mmol/L — ABNORMAL HIGH (ref 3.5–5.1)
Potassium: 4.6 mmol/L (ref 3.5–5.1)
Sodium: 142 mmol/L (ref 135–145)
Sodium: 145 mmol/L (ref 135–145)

## 2021-01-08 LAB — LACTIC ACID, PLASMA
Lactic Acid, Venous: 3.2 mmol/L (ref 0.5–1.9)
Lactic Acid, Venous: 3.1 mmol/L (ref 0.5–1.9)
Lactic Acid, Venous: 2.9 mmol/L (ref 0.5–1.9)
Lactic Acid, Venous: 3 mmol/L (ref 0.5–1.9)

## 2021-01-08 LAB — MAGNESIUM: Magnesium: 3.3 mg/dL — ABNORMAL HIGH (ref 1.7–2.4)

## 2021-01-08 LAB — PHOSPHORUS: Phosphorus: 3.7 mg/dL (ref 2.5–4.6)

## 2021-01-08 MED ORDER — LACTATED RINGERS IV BOLUS
1000.0000 mL | Freq: Once | INTRAVENOUS | Status: AC
  Administered 2021-01-08: 1000 mL via INTRAVENOUS

## 2021-01-08 MED ORDER — LEVETIRACETAM IN NACL 1500 MG/100ML IV SOLN: 1500.0000 mg | Freq: Two times a day (BID) | INTRAVENOUS | Status: DC

## 2021-01-08 MED ORDER — SODIUM CHLORIDE 0.9 % IV SOLN
750.0000 mg | Freq: Two times a day (BID) | INTRAVENOUS | Status: DC
  Administered 2021-01-08 – 2021-01-15 (×14): 750 mg via INTRAVENOUS
  Filled 2021-01-08 (×18): qty 7.5

## 2021-01-08 MED ORDER — SODIUM CHLORIDE 0.9 % IV BOLUS
500.0000 mL | Freq: Once | INTRAVENOUS | Status: AC
  Administered 2021-01-08: 500 mL via INTRAVENOUS

## 2021-01-08 MED ORDER — INSULIN ASPART 100 UNIT/ML ~~LOC~~ SOLN
0.0000 [IU] | SUBCUTANEOUS | Status: DC
  Administered 2021-01-08: 3 [IU] via SUBCUTANEOUS
  Administered 2021-01-08: 5 [IU] via SUBCUTANEOUS
  Administered 2021-01-08: 8 [IU] via SUBCUTANEOUS
  Administered 2021-01-09 – 2021-01-10 (×9): 5 [IU] via SUBCUTANEOUS

## 2021-01-08 MED ORDER — MIDAZOLAM HCL 2 MG/2ML IJ SOLN
INTRAMUSCULAR | Status: AC
  Administered 2021-01-08: 2 mg via INTRAVENOUS
  Filled 2021-01-08: qty 2

## 2021-01-08 MED ORDER — ENOXAPARIN SODIUM 30 MG/0.3ML ~~LOC~~ SOLN
30.0000 mg | SUBCUTANEOUS | Status: DC
  Administered 2021-01-08 – 2021-01-14 (×7): 30 mg via SUBCUTANEOUS
  Filled 2021-01-08 (×7): qty 0.3

## 2021-01-08 MED ORDER — SODIUM CHLORIDE 0.9 % IV SOLN: 2000.0000 mg | Freq: Once | INTRAVENOUS | Status: DC

## 2021-01-08 MED ORDER — SODIUM CHLORIDE 0.9 % IV BOLUS
1000.0000 mL | Freq: Once | INTRAVENOUS | Status: AC
  Administered 2021-01-08: 1000 mL via INTRAVENOUS

## 2021-01-08 MED ORDER — LEVETIRACETAM IN NACL 1500 MG/100ML IV SOLN
1500.0000 mg | Freq: Once | INTRAVENOUS | Status: AC
  Administered 2021-01-08: 1500 mg via INTRAVENOUS
  Filled 2021-01-08: qty 100

## 2021-01-08 MED ORDER — MIDAZOLAM HCL 2 MG/2ML IJ SOLN: 2.0000 mg | Freq: Once | INTRAMUSCULAR | Status: AC

## 2021-01-08 NOTE — Consult Note (Addendum)
NAME:  Thomas Chung, MRN:  [id number], DOB:  Aug 02, 1941, LOS: 7 ADMISSION DATE:  12/27/2020, CONSULTATION DATE: 01/06/2019 REFERRING MD: Triad, CHIEF COMPLAINT: Acute hypoxic respiratory failure  Brief History:  80 year old with history of Covid on 12/22/2020 who defervesced and required intubation on 01/06/2021  History of Present Illness:  80 year old former smoker quit when he was 79 he has a past medical history for Covid on 12/22/2020, along with type 2 diabetes with neuropathy pancreatic insufficiency hypertension history of gastroesophageal reflux disease.  He was admitted on 12/27/2020 with worsening respiratory distress and suspected ileus.  On 01/04/2021 had increasing oxygen demand demands and by 01/06/2021 was on 100% 35 L flow of oxygen pulmonary critical care was asked to evaluate and he was intubated at that time.  He has completed antiviral therapy.  He remains on Solu-Medrol and Acterma.  Currently is on full mechanical ventilatory support ARDS protocol his wife and daughter have been updated via the phone.  Past Medical History:   Past Medical History:  Diagnosis Date  . Depression   . Fatty liver   . Glucose intolerance (impaired glucose tolerance)   . HOH (hard of hearing)    bilateral hearing aides  . Hx of colonic polyps   . Hyperlipidemia   . Hypertension   . Neuromuscular disorder ([place])    neuropathy feet    Significant Hospital Events:  01/06/2021 intubated  Consults:  General surgery Critical care  Procedures:  01/06/2021 intubation 17 2022 NG tube left nares>>  Significant Diagnostic Tests:  2/18 ct abd>>segmental enteritis in right lower quadrant concerning for early ischemia. No bowel obstruction. Splenomegaly with findings for possible hypoperfusion/early infarct. Basilar lung with consolidation  Micro Data:  01/07/2020 sputum>> 01/07/2021 blood cultures x2>> 01/07/2021 urine culture>>  Antimicrobials:  01/07/2021 vancomycin>> 01/07/2021  cefepime>>  Interim History / Subjective:  Respiratory status improved. Currently on FIO2 40%  Overnight general surgery called to evaluate patient regarding CT findings concerning for enteritis with pneumatosis. Consult team recommended antibiotics and NPO status for now.   This morning patient had right sided jerking movements including jaw, RUE and RLE. Neurology NP at bedside with me.  Objective   Blood pressure 123/89, pulse 81, temperature 98.5 F (36.9 C), temperature source Axillary, resp. rate (!) 28, height 6\' 4"  (1.93 m), weight 113.4 kg, SpO2 97 %.    Vent Mode: PRVC FiO2 (%):  [50 %-60 %] 50 % Set Rate:  [26 bmp] 26 bmp Vt Set:  [610 mL] 610 mL PEEP:  [8 cmH20] 8 cmH20 Plateau Pressure:  [11 cmH20-30 cmH20] 30 cmH20   Intake/Output Summary (Last 24 hours) at 01/08/2021 0817 Last data filed at 01/08/2021 0500 Gross per 24 hour  Intake 2135.54 ml  Output 650 ml  Net 1485.54 ml   Filed Weights   01/04/2021 2306 01/05/21 1141 01/08/21 0630  Weight: 116.3 kg 114.5 kg 113.4 kg    Physical Exam: General: Elderly-appearing male, no acute distress, right sided jerking movements HENT: [place], AT, ETT in place Eyes: EOMI, no scleral icterus Respiratory: Clear breath sounds bilaterally.  No crackles, wheezing or rales Cardiovascular: RRR, -M/R/G, no JVD GI: Hypoactive BS, grimaces with palpation, guarding Extremities:-Edema,-tenderness Neuro: Sedated  I reviewed and interpreted CT A/P as noted above  Resolved Hospital Problem list     Assessment & Plan:   Acute hypoxemic respiratory failure/ARDS secondary to [id number] complicated by possible bacterial pneumonia Tested positive for Covid on 12/22/2020 he was unvaccinated for COVID-19. Respiratory status improving and nearing  minimal vent requirement. Plan:  --S/p remdesivir 2/12-2/16 and actemra 2/16 --Continue steroids for 10 day course (end 2/21) --ARDSnet ventilator protocol. Wean FIO2 and PEEP for goal SpO2 88-95%.  Reduce PEEP to 5 --Continue broad spectrum antibiotics.   Sepsis secondary to segmental enteritis/ischemia Initially concerning for co-comitant bacterial pneumonia however with improved oxygenation his leukocytosis and lactic acidosis is likely related to abdominal source. CT reported enteritis concerning for early ischemia. No bowel obstruction seen. Plan: --General surgery consulted. On review of CT no perforation per team. Recommends antibiotics and fluid support. Patient would be poor operative candidate --Maintenance NS @ 75cc/hr --Trend LA. Give additional IVF fluid bolus PRN  Seizure-like activity  CT head neg. Neuro consulted yesterday. Plan: --Appreciate Neuro input. AEDs per consult team --Plan for EEG/LTM  AKI - worsening Cr. UOP improving  Hyperkalemia Metabolic acidosis Plan: --Repeat BMP --Monitor UOP/Cr  Peripheral vascular compromise with bilateral lower extremities with blue toes noted Areas of hypoperfusion are marked. Requiring dopplers to find distal pulses --Bilateral ABIs ordered  Hypertension --Off Cardene --Continue to monitor blood pressure  Diabetes mellitus type 2 CBG (last 3)  Recent Labs    01/07/21 2012 01/07/21 2346 01/08/21 0326  GLUCAP 315* 325* 298*   Sliding-scale insulin protocol  History of pancreatic insufficiency Creon is on hold   Best practice (evaluated daily)  Diet: NPO Pain/Anxiety/Delirium protocol (if indicated): PAD protocol VAP protocol (if indicated): Yes DVT prophylaxis: PPI Glucose control: Sliding scale insulin protocol Mobility: Bedrest Disposition: Intensive care unit Updated wife on 01/08/21.  Goals of Care:  Last date of multidisciplinary goals of care discussion: 01/06/2021 via phone Family and staff present: 01/06/2021 spoke with wife and daughter made them aware of impending intubation.Updated in person 2/17 per MD Summary of discussion: Agree they want intubation if necessary. Follow up goals of care  discussion due:2/24 Code Status: Full code  Labs   CBC: Recent Labs  Lab 01/03/21 0253 01/04/21 0421 01/05/21 0438 01/06/21 0258 01/07/21 0522 01/08/21 0242  WBC 6.8 10.9* 26.1* 31.8* 36.8* 33.8*  NEUTROABS 5.9 9.6* 21.9* 27.7*  --  30.0*  HGB 13.8 14.1 15.1 14.4 14.2 12.5*  HCT 42.3 43.8 46.4 44.3 44.3 39.2  MCV 78.6* 79.1* 78.0* 78.0* 80.4 80.8  PLT 339 354 290 220 139* 222    Basic Metabolic Panel: Recent Labs  Lab 01/05/21 0438 01/06/21 0258 01/07/21 0522 01/07/21 0757 01/08/21 0242  NA 138 142 142 143 142  K 4.1 4.3 5.7* 4.6 5.2*  CL 105 108 108 106 109  CO2 22 26 21* 23 20*  GLUCOSE 192* 268* 328* 361* 323*  BUN 35* 41* 69* 77* 92*  CREATININE 1.08 1.41* 2.51* 2.63* 3.10*  CALCIUM 8.1* 8.0* 7.8* 7.9* 7.6*  MG  --  2.7*  --   --  3.3*  PHOS  --  2.3*  --   --  3.7   GFR: Estimated Creatinine Clearance: 26.6 mL/min (A) (by C-G formula based on SCr of 3.1 mg/dL (H)). Recent Labs  Lab 01/10/2021 1725 01/06/2021 1726 01/02/21 0434 01/05/21 0438 01/06/21 0258 01/06/21 0916 01/07/21 0522 01/07/21 0801 01/07/21 1146 01/08/21 0242  PROCALCITON  --  <0.10  --   --   --  0.43  --   --   --   --   WBC  --  8.3   < > 26.1* 31.8*  --  36.8*  --   --  33.8*  LATICACIDVEN 1.4  --   --   --   --   --   --  3.3* 3.2* 3.2*   < > = values in this interval not displayed.    Liver Function Tests: Recent Labs  Lab 01/02/21 0434 01/03/21 0253 01/04/21 0421 01/05/21 0438 01/06/21 0258  AST 47* 54* 60* 78* 78*  ALT 32 41 49* 62* 73*  ALKPHOS 51 50 61 118 164*  BILITOT 0.8 0.8 0.7 1.0 1.6*  PROT 6.3* 5.9* 5.8* 6.2* 6.0*  ALBUMIN 3.3* 3.1* 3.1* 3.3* 3.1*   No results for input(s): LIPASE, AMYLASE in the last 168 hours. No results for input(s): AMMONIA in the last 168 hours.  ABG    Component Value Date/Time   PHART 7.399 01/07/2021 0520   PCO2ART 35.1 01/07/2021 0520   PO2ART 64.3 (L) 01/07/2021 0520   HCO3 21.3 01/07/2021 0520   ACIDBASEDEF 2.4 (H)  01/07/2021 0520   O2SAT 92.1 01/07/2021 0520     Coagulation Profile: No results for input(s): INR, PROTIME in the last 168 hours.  Cardiac Enzymes: No results for input(s): CKTOTAL, CKMB, CKMBINDEX, TROPONINI in the last 168 hours.  HbA1C: Hgb A1c MFr Bld  Date/Time Value Ref Range Status  11/04/2020 02:29 PM 6.7 (H) 4.6 - 6.5 % Final    Comment:    Glycemic Control Guidelines for People with Diabetes:Non Diabetic:  <6%Goal of Therapy: <7%Additional Action Suggested:  >8%   08/05/2020 01:45 PM 6.5 (H) <5.7 % of total Hgb Final    Comment:    For someone without known diabetes, a hemoglobin A1c value of 6.5% or greater indicates that they may have  diabetes and this should be confirmed with a follow-up  test. . For someone with known diabetes, a value <7% indicates  that their diabetes is well controlled and a value  greater than or equal to 7% indicates suboptimal  control. A1c targets should be individualized based on  duration of diabetes, age, comorbid conditions, and  other considerations. . Currently, no consensus exists regarding use of hemoglobin A1c for diagnosis of diabetes for children. .     CBG: Recent Labs  Lab 01/07/21 1130 01/07/21 1621 01/07/21 2012 01/07/21 2346 01/08/21 0326  GLUCAP 322* 293* 315* 325* 298*      Critical care time: 40 min    The patient is critically ill with multiple organ systems failure and requires high complexity decision making for assessment and support, frequent evaluation and titration of therapies, application of advanced monitoring technologies and extensive interpretation of multiple databases.    Rodman Pickle, M.D. [facility] Pulmonary/Critical Care Medicine 01/08/2021 8:17 AM   Please see Amion for pager number to reach on-call Pulmonary and Critical Care Team.

## 2021-01-08 NOTE — Progress Notes (Signed)
ABI's have been completed. Preliminary results can be found in CV Proc through chart review.   01/08/21 11:19 AM Thomas Chung RVT

## 2021-01-08 NOTE — Progress Notes (Signed)
[place] Progress Note Patient Name: [name] DOB: July 06, 1941 MRN: [id number]   Date of Service  01/08/2021  HPI/Events of Note  Lactic Acid = 2.9 --> 3.0. Last LVEF = 55-60%  eICU Interventions  Plan: 1. Blous with 0.9 NaCl 1 liter IV over 1 hour now.      Intervention Category Major Interventions: Acid-Base disturbance - evaluation and management  Jayleon Mcfarlane [place] 01/08/2021, 10:01 PM

## 2021-01-08 NOTE — Progress Notes (Signed)
[place] Progress Note Patient Name: Thomas Chung DOB: 1941/07/12 MRN: [id number]   Date of Service  01/08/2021  HPI/Events of Note  Lactic acid 3.2, K+ 5.2.  eICU Interventions  Normal Saline 500 ml iv fluid bolus x 1 ordered.        Kerry Kass Rosella Crandell 01/08/2021, 4:55 AM

## 2021-01-08 NOTE — Progress Notes (Signed)
Patient ID: Thomas Chung, male   DOB: 27-Apr-1941, 80 y.o.   MRN: [id number]   Acute Care Surgery Service Progress Note:    Chief Complaint/Subjective: No fever. No tachy Ct reviewed.  Pt intubated  Objective: Vital signs in last 24 hours: Temp:  [98 F (36.7 C)-98.5 F (36.9 C)] 98.3 F (36.8 C) (02/19 0843) Pulse Rate:  [81-91] 81 (02/19 0800) Resp:  [16-30] 28 (02/19 0800) BP: (123-154)/(64-89) 123/89 (02/19 0800) SpO2:  [94 %-100 %] 97 % (02/19 0813) FiO2 (%):  [40 %-60 %] 40 % (02/19 0813) Weight:  [113.4 kg] 113.4 kg (02/19 0630) Last BM Date: 01/03/21  Intake/Output from previous day: 02/18 0701 - 02/19 0700 In: 2135.5 [I.V.:1783.3; IV Piggyback:352.3] Out: 650 [Urine:650] Intake/Output this shift: Total I/O In: 763 [I.V.:162.7; IV Piggyback:600.2] Out: 470 [Urine:400; Emesis/NG output:70]  Lungs: intubated,  nonlabored  Cardiovascular: reg  Abd: soft, not rigid/firm;   Extremities: no edema, +SCDs, blue toes, I can palpate a pulse DP in Rt foot  Neuro: intubated/sedated  Lab Results: CBC  Recent Labs    01/07/21 0522 01/08/21 0242  WBC 36.8* 33.8*  HGB 14.2 12.5*  HCT 44.3 39.2  PLT 139* 225   BMET Recent Labs    01/07/21 0757 01/08/21 0242  NA 143 142  K 4.6 5.2*  CL 106 109  CO2 23 20*  GLUCOSE 361* 323*  BUN 77* 92*  CREATININE 2.63* 3.10*  CALCIUM 7.9* 7.6*   LFT Hepatic Function Latest Ref Rng & Units 01/06/2021 01/05/2021 01/04/2021  Total Protein 6.5 - 8.1 g/dL 6.0(L) 6.2(L) 5.8(L)  Albumin 3.5 - 5.0 g/dL 3.1(L) 3.3(L) 3.1(L)  AST 15 - 41 U/L 78(H) 78(H) 60(H)  ALT 0 - 44 U/L 73(H) 62(H) 49(H)  Alk Phosphatase 38 - 126 U/L 164(H) 118 61  Total Bilirubin 0.3 - 1.2 mg/dL 1.6(H) 1.0 0.7  Bilirubin, Direct 0.0 - 0.3 mg/dL - - -   PT/INR No results for input(s): LABPROT, INR in the last 72 hours. ABG Recent Labs    01/06/21 1110 01/07/21 0520  PHART 7.358 7.399  HCO3 23.5 21.3     Studies/Results:  Anti-infectives: Anti-infectives (From admission, onward)   Start     Dose/Rate Route Frequency Ordered Stop   01/07/21 2200  piperacillin-tazobactam (ZOSYN) IVPB 3.375 g  Status:  Discontinued       Note to Pharmacy: Pharmacy may modify dose for renal dysfunction. Pharmacy to manage and monitor antibiotic administration.   3.375 g 100 mL/hr over 30 Minutes Intravenous Every 8 hours 01/07/21 2014 01/07/21 2017   01/07/21 2200  piperacillin-tazobactam (ZOSYN) IVPB 3.375 g        3.375 g 12.5 mL/hr over 240 Minutes Intravenous Every 8 hours 01/07/21 2018     01/07/21 1030  vancomycin (VANCOREADY) IVPB 2000 mg/400 mL        2,000 mg 200 mL/hr over 120 Minutes Intravenous  Once 01/07/21 0929 01/07/21 1327   01/07/21 1000  ceFEPIme (MAXIPIME) 2 g in sodium chloride 0.9 % 100 mL IVPB  Status:  Discontinued        2 g 200 mL/hr over 30 Minutes Intravenous Every 12 hours 01/07/21 0928 01/07/21 2010   01/07/21 0930  vancomycin variable dose per unstable renal function (pharmacist dosing)         Does not apply See admin instructions 01/07/21 0930     01/02/21 1000  remdesivir 100 mg in sodium chloride 0.9 % 100 mL IVPB       "  Followed by" Linked Group Details   100 mg 200 mL/hr over 30 Minutes Intravenous Daily 01/08/2021 1845 01/05/21 0921   01/11/2021 2000  remdesivir 200 mg in sodium chloride 0.9% 250 mL IVPB       "Followed by" Linked Group Details   200 mg 580 mL/hr over 30 Minutes Intravenous Once 01/10/2021 1845 12/26/2020 2017      Medications: Scheduled Meds: . chlorhexidine gluconate (MEDLINE KIT)  15 mL Mouth Rinse BID  . Chlorhexidine Gluconate Cloth  6 each Topical Daily  . docusate  100 mg Per Tube BID  . enoxaparin (LOVENOX) injection  30 mg Subcutaneous Q24H  . insulin aspart  0-9 Units Subcutaneous Q4H  . ipratropium-albuterol  3 mL Nebulization Q6H  . lidocaine  1 application Other Once  . mouth rinse  15 mL Mouth Rinse 10 times per day  .  methylPREDNISolone (SOLU-MEDROL) injection  60 mg Intravenous Q12H  . ondansetron (ZOFRAN) IV  4 mg Intravenous Once  . pantoprazole (PROTONIX) IV  40 mg Intravenous Q12H  . polyethylene glycol  17 g Per Tube Daily  . vancomycin variable dose per unstable renal function (pharmacist dosing)   Does not apply See admin instructions   Continuous Infusions: . sodium chloride Stopped (01/08/21 0627)  . fentaNYL infusion INTRAVENOUS 25 mcg/hr (01/08/21 0857)  . levETIRAcetam    . levETIRAcetam    . piperacillin-tazobactam (ZOSYN)  IV 12.5 mL/hr at 01/08/21 0857  . propofol (DIPRIVAN) infusion 25 mcg/kg/min (01/08/21 0857)   PRN Meds:.acetaminophen (TYLENOL) oral liquid 160 mg/5 mL, alum & mag hydroxide-simeth, fentaNYL, fentaNYL (SUBLIMAZE) injection, hydrALAZINE, labetalol, morphine injection, ondansetron (ZOFRAN) IV  Assessment/Plan: Patient Active Problem List   Diagnosis Date Noted  . Acute respiratory disease due to COVID-19 virus 01/02/2021  . Elevated serum creatinine 01/02/2021  . Pneumonia due to COVID-19 virus 01/13/2021  . COVID-19 12/27/2020  . Gout 08/05/2020  . Need for hepatitis C screening test 08/05/2020  . Onychomycosis of multiple toenails with type 2 diabetes mellitus (HCC) 08/06/2019  . 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 B12 deficiency 10/31/2018  . Thrombocytosis 10/29/2018  . GERD without esophagitis 01/22/2018  . Asymptomatic gallstones 09/09/2015  . Routine general medical examination at a health care facility 02/10/2015  . Prostate nodule with urinary obstruction 07/17/2011  . Type II diabetes mellitus with manifestations (HCC) 08/05/2009  . Diabetic neuropathy, painful (HCC) 07/06/2009  . Hyperlipidemia with target LDL less than 100 04/22/2009  . Essential hypertension, benign 04/22/2009  . Erectile dysfunction associated with type 2 diabetes mellitus (HCC) 04/21/2009  COVID pneumonia with VDRF with likely HCAP as well  - intubated yesterday  -  solumedrol/remdexivir/tocilizumab - started on vanc/cefepime  - WBC 10.9>26.1>31.8>36.8>33 Worsening renal failure - Cr 3 from 2.63 from 2.51   Peripheral vascular compromise - hypoperfusion noted  Pancreaticinsufficiency - On Creon at home, currently on hold Type 2 diabetes Hypertension Hyperlipidemia Gout Hx B12 deficiency 20 pack yr hx of tobacco use, none since age 45  Ileus, distal SB enteritis, ?pneumatosis  Ct reviewed Discussed with Dr Luisa Hart Discussed with CCM - uop improving but Cr up, vent settings not severe  There is some probable enteritis in the distal small bowel and perhaps some early ischemia however there is no free air.  There is no portal venous gas.  There is questionable pneumatosis.  He is not febrile.  He is not tachycardic.  His lactate is currently stable.  His abdomen is soft.  I think right now he is stable  with respect to his abdomen.  I did discuss his overall clinical picture with Dr. Loanne Drilling with CCM.  His ventilator settings are not high.  He is not requiring prone ventilation.  She feels that his renal function is stable or perhaps may be mildly improving since his urine output is increased even though his creatinine is increasing.  I think it is reasonable to offer laparotomy should he become febrile, worsening lactic acidosis, hypotensive.  He would still be extremely high risk.  We will continue to monitor.  I discussed the case with Dr. Brantley Stage and we both agree with this plan Disposition:  LOS: 7 days    Leighton Ruff. Redmond Pulling, MD, FACS General, Bariatric, & Minimally Invasive Surgery [phone] [facility]

## 2021-01-08 NOTE — Progress Notes (Signed)
PHARMACY - PHYSICIAN COMMUNICATION CRITICAL VALUE ALERT - BLOOD CULTURE IDENTIFICATION (BCID)  Thomas Chung is an 80 y.o. male who presented to [facility] on 01/04/2021 with a chief complaint of COVID pneumonia.  Assessment:  Gram variable rods in 1 of 3 blood cultures, no BCID (include suspected source if known). Likely source enteritis/ischemia.  Name of physician (or Provider) Contacted: Dr. Loanne Drilling  Current antibiotics: Vancomycin and Zosyn  Changes to prescribed antibiotics recommended:  Patient is on recommended antibiotics - No changes needed  Will consider stopping vancomycin at 48hr if clinically improvement  No results found for this or any previous visit.  Peggyann Juba, PharmD, BCPS Pharmacy: [phone] 01/08/2021  9:34 AM

## 2021-01-08 NOTE — Consult Note (Signed)
Neurology Consultation  Reason for Consult: seizure activity vs propofol shivering Referring Physician: Evette Doffing, MD   CC: seizure like activity  History is obtained from: chart  HPI: LINSEY HIROTA is a 80 y.o. male with a PMHx of depression HLD, DM II, HTN, and peripheral neuropathy. Patient presented to ED on 12/24/2020 with c/o weakness and diarrhea. Patient reported weakness, cough and intermittent fever which began on 12/19/20. Patient tested positive for COVID. His diarrhea continued, so he came in.   In the ED, he was tachycardic and tachypneic and placed on O2. CXR with COVID pneumonia. He was admitted. Surgery was consulted for concern for SBO. Surgery recommended CT and agreed with NPO/bowel rest. Patient with worsening respiratory status and was intubated on 01/06/21.   On 01/07/21, PCCM noted abnormal movements of shaking and shivering lasting 3 minutes. This resolved after versed $RemoveBefo'2mg'nGteZCnSxzH$ . Neurology was consulted due to this shaking. Patient was placed on Keppra $RemoveB'500mg'fivTenIO$  IV q12 hrs. However, this shaking continued and we saw the patient today.   ROS: Unable to obtain due to altered mental status.   Past Medical History:  Diagnosis Date  . Depression   . Fatty liver   . Glucose intolerance (impaired glucose tolerance)   . HOH (hard of hearing)    bilateral hearing aides  . Hx of colonic polyps   . Hyperlipidemia   . Hypertension   . Neuromuscular disorder (HCC)    neuropathy feet    Family History  Problem Relation Age of Onset  . Arrhythmia Mother   . Heart attack Mother   . Heart disease Mother   . Heart failure Father   . Diabetes Father   . Coronary artery disease Other        1st degree relative<60  . Alcohol abuse Other   . Diabetes Other   . Stomach cancer Sister   . Cancer Neg Hx   . Stroke Neg Hx   . Hyperlipidemia Neg Hx   . Hypertension Neg Hx   . Kidney disease Neg Hx    Social History:   reports that he quit smoking about 51 years ago. He has never  used smokeless tobacco. He reports current alcohol use of about 6.0 standard drinks of alcohol per week. He reports that he does not use drugs.  Medications  Current Facility-Administered Medications:  .  0.9 %  sodium chloride infusion, , Intravenous, Continuous, Minor, Grace Bushy, NP, Last Rate: 75 mL/hr at 01/08/21 1205, Infusion Verify at 01/08/21 1205 .  acetaminophen (TYLENOL) 160 MG/5ML solution 650 mg, 650 mg, Per Tube, Q6H PRN, Ogan, Okoron[place]wo U, MD .  alum & mag hydroxide-simeth (MAALOX/MYLANTA) 200-200-20 MG/5ML suspension 15 mL, 15 mL, Oral, Q6H PRN, Dahal, Binaya, MD .  chlorhexidine gluconate (MEDLINE KIT) (PERIDEX) 0.12 % solution 15 mL, 15 mL, Mouth Rinse, BID, Dahal, Binaya, MD, 15 mL at 01/08/21 0819 .  Chlorhexidine Gluconate Cloth 2 % PADS 6 each, 6 each, Topical, Daily, Dahal, Marlowe Aschoff, MD, 6 each at 01/08/21 0916 .  docusate (COLACE) 50 MG/5ML liquid 100 mg, 100 mg, Per Tube, BID, Minor, Grace Bushy, NP, 100 mg at 01/08/21 1017 .  enoxaparin (LOVENOX) injection 30 mg, 30 mg, Subcutaneous, Q24H, Margaretha Seeds, MD .  fentaNYL (SUBLIMAZE) bolus via infusion 25 mcg, 25 mcg, Intravenous, Q15 min PRN, Margaretha Seeds, MD .  fentaNYL (SUBLIMAZE) injection 50-200 mcg, 50-200 mcg, Intravenous, Q30 min PRN, Minor, Grace Bushy, NP, 100 mcg at 01/08/21 0900 .  fentaNYL 2555mcg in  NS 23mL (23mcg/ml) infusion-PREMIX, 25-200 mcg/hr, Intravenous, Continuous, Margaretha Seeds, MD, Last Rate: 2.5 mL/hr at 01/08/21 0857, 25 mcg/hr at 01/08/21 0857 .  hydrALAZINE (APRESOLINE) injection 10 mg, 10 mg, Intravenous, Q6H PRN, Dahal, Binaya, MD, 10 mg at 01/05/21 1156 .  insulin aspart (novoLOG) injection 0-9 Units, 0-9 Units, Subcutaneous, Q4H, Dahal, Binaya, MD, 5 Units at 01/08/21 1235 .  ipratropium-albuterol (DUONEB) 0.5-2.5 (3) MG/3ML nebulizer solution 3 mL, 3 mL, Nebulization, Q6H, Minor, Grace Bushy, NP, 3 mL at 01/08/21 0811 .  labetalol (NORMODYNE) injection 10 mg, 10 mg, Intravenous, Q4H  PRN, Dahal, Binaya, MD, 10 mg at 01/08/21 1126 .  levETIRAcetam (KEPPRA) 750 mg in sodium chloride 0.9 % 100 mL IVPB, 750 mg, Intravenous, Q12H, Kirby-Graham, Karen J, NP .  lidocaine (XYLOCAINE) 2 % jelly 1 application, 1 application, Other, Once, Minor, Grace Bushy, NP .  MEDLINE mouth rinse, 15 mL, Mouth Rinse, 10 times per day, Dahal, Marlowe Aschoff, MD, 15 mL at 01/08/21 1128 .  methylPREDNISolone sodium succinate (SOLU-MEDROL) 125 mg/2 mL injection 60 mg, 60 mg, Intravenous, Q12H, Margaretha Seeds, MD, 60 mg at 01/08/21 1016 .  morphine 2 MG/ML injection 2 mg, 2 mg, Intravenous, Q4H PRN, Dahal, Binaya, MD .  ondansetron (ZOFRAN) injection 4 mg, 4 mg, Intravenous, Once, Joldersma, Logan, PA-C .  ondansetron (ZOFRAN) injection 4 mg, 4 mg, Intravenous, Q4H PRN, Dahal, Binaya, MD, 4 mg at 01/04/21 1901 .  pantoprazole (PROTONIX) injection 40 mg, 40 mg, Intravenous, Q12H, Dahal, Binaya, MD, 40 mg at 01/08/21 1016 .  piperacillin-tazobactam (ZOSYN) IVPB 3.375 g, 3.375 g, Intravenous, Q8H, Adrian Saran, [place], Stopped at 01/08/21 1051 .  polyethylene glycol (MIRALAX / GLYCOLAX) packet 17 g, 17 g, Per Tube, Daily, Minor, Grace Bushy, NP, 17 g at 01/08/21 1017 .  propofol (DIPRIVAN) 1000 MG/100ML infusion, 0-50 mcg/kg/min, Intravenous, Continuous, Minor, Grace Bushy, NP, Last Rate: 30.9 mL/hr at 01/08/21 1234, 45 mcg/kg/min at 01/08/21 1234 .  vancomycin variable dose per unstable renal function (pharmacist dosing), , Does not apply, See admin instructions, Emiliano Dyer, [facility]   Exam: Current vital signs: BP 138/61 (BP Location: Right Arm)   Pulse 69   Temp 98.3 F (36.8 C) (Oral)   Resp (!) 26   Ht $R'6\' 4"'TB$  (1.93 m)   Wt 113.4 kg   SpO2 94%   BMI 30.43 kg/m  Vital signs in last 24 hours: Temp:  [98 F (36.7 C)-98.5 F (36.9 C)] 98.3 F (36.8 C) (02/19 0843) Pulse Rate:  [69-94] 69 (02/19 1200) Resp:  [10-30] 26 (02/19 1200) BP: (123-190)/(61-123) 138/61 (02/19 1200) SpO2:  [91 %-100 %] 94 %  (02/19 1200) FiO2 (%):  [40 %-50 %] 40 % (02/19 1200) Weight:  [113.4 kg] 113.4 kg (02/19 0630)  GENERAL: Sedated on Propofol at 25 and Fentanyl 2.5.  HEENT: - Normocephalic and atraumatic LUNGS - intubated on ventilator CV - RRR on tele Ext: cool with marks on feet.  Psych: can not be assessed  GCS-Best verbal   1     Best motor   1      Best eye   1           Total  3  NEURO:  Mental Status: unresponsive. Speech/Language: unresponsive.   Cranial Nerves:  II: PERRL. No blink to threat.  III, IV, VI: Eyes are midline. Absent doll's eyes. No nystagmus. V, VII: no reaction to brow pressure. + corneal reflexes OU.    VII: Smile is symmetrical. Able to  puff cheeks and raise eyebrows.  VIII: no reaction to name calling, chest rub, or loud clap.  IX, X: no cough with suctioning. XI: head midline. XII: intubated. Motor: No response to noxious stimuli x 4 extremitis and sternocleidals.  Tone is normal. Bulk is normal.  Sensation- no withdrawal or intentional movements with noxious stimuli Coordination/cerebellum: fine tremors noted to right face and mouth extending to the right shoulder.  DTRs: can not illicit Gait- deferred  Labs I have reviewed labs in epic and the results pertinent to this consultation are:  CBC    Component Value Date/Time   WBC 33.8 (H) 01/08/2021 0242   RBC 4.85 01/08/2021 0242   HGB 12.5 (L) 01/08/2021 0242   HGB 14.8 12/29/2020 1123   HCT 39.2 01/08/2021 0242   HCT 44.9 12/29/2020 1123   PLT 225 01/08/2021 0242   PLT 353 12/29/2020 1123   MCV 80.8 01/08/2021 0242   MCV 78 (L) 12/29/2020 1123   MCH 25.8 (L) 01/08/2021 0242   MCHC 31.9 01/08/2021 0242   RDW 16.6 (H) 01/08/2021 0242   RDW 15.8 (H) 12/29/2020 1123   LYMPHSABS 0.5 (L) 01/08/2021 0242   MONOABS 2.2 (H) 01/08/2021 0242   EOSABS 0.1 01/08/2021 0242   BASOSABS 0.1 01/08/2021 0242    CMP     Component Value Date/Time   NA 145 01/08/2021 1134   NA 133 (L) 12/29/2020 1123   K 4.6  01/08/2021 1134   CL 110 01/08/2021 1134   CO2 21 (L) 01/08/2021 1134   GLUCOSE 314 (H) 01/08/2021 1134   BUN 98 (H) 01/08/2021 1134   BUN 36 (H) 12/29/2020 1123   CREATININE 3.16 (H) 01/08/2021 1134   CREATININE 1.20 (H) 08/05/2020 1345   CALCIUM 7.8 (L) 01/08/2021 1134   PROT 6.0 (L) 01/06/2021 0258   PROT 6.5 12/29/2020 1123   ALBUMIN 3.1 (L) 01/06/2021 0258   ALBUMIN 4.3 12/29/2020 1123   AST 78 (H) 01/06/2021 0258   ALT 73 (H) 01/06/2021 0258   ALKPHOS 164 (H) 01/06/2021 0258   BILITOT 1.6 (H) 01/06/2021 0258   BILITOT 0.5 12/29/2020 1123   GFRNONAA 19 (L) 01/08/2021 1134   GFRNONAA 58 (L) 08/05/2020 1345   GFRAA 48 (L) 12/29/2020 1123   GFRAA 67 08/05/2020 1345    Lipid Panel     Component Value Date/Time   CHOL 117 02/03/2020 1356   TRIG 255 (H) 01/07/2021 0244   HDL 34.20 (L) 02/03/2020 1356   CHOLHDL 3 02/03/2020 1356   VLDL 33.6 02/03/2020 1356   LDLCALC 50 02/03/2020 1356     Imaging MD reviewed the images obtained  CT head 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  Assessment: 11 yp male who presented with weakness and diarrhea. Had been treated conservative for COVID at home and has been treated more aggressively here. He continued to have respiratory difficulties and was intubated. He has corneal reflexes but otherwise, it is difficult to illicit any type of reaction to exam. His tremors were evident on exam today, even with decreased dose of Propofol. Do not believe these tremors are secondary to Propofol shivering and we will load patient and increase his maintenance dose.   Impression: seizure like activity without evidence of brain injury.   Recommendations: -MRI brain when able.  -EEG with LTM and video -Load of 1500 mg of Keppra has been given (had to decrease dose due to renal insufficiency. Keppra maintenance dose 750 mg bid for same reason above.  -Neurology  will follow.   Thank you for this consult.   Pt seen by  Clance Boll, NP/Neuro and later by MD. Note/plan to be edited by MD as needed.  Pager: [phone]  Attestation:  I saw this patient with the APP on 01/08/21, obtained pertinent aspects of the history, and performed relevant physical and neurological examination as documented. Also, I reviewed the available laboratory data and neuroimages, and other relevant tests/notes/procedures.  Patient intubated, sedated on fentanyl and propofol. I discontinued propofol for 5-10 minutes and observed no appreciable change in limb movements, though some mouth movements and swallowing were observed, so the drip was restarted.  My examination findings include intact pupillary light reflexes and oculocephalic. Gag reflex observed. No obvious grimace or withdrawal from pain. Flaccid/quadriparetic/sedated.  CT scan reviewed. Nothing of concern.  Impression: Suspected focal seizures observed by ICU team and our NP, Ms. Lionel December. I was not able to observe the movements, but the patient was re-loaded with keppra in the interval between initial assessment and my re-evaluation.   Recommendations: - MRI brain without contrast (given Cr = 3.16) - EEG and LTVM (long-term video monitoring) - I agree with current plan to have him transferred over to [facility] as this will facilitate neurology care and EEG monitoring.  Thank you.  Perfecto Kingdom, MD

## 2021-01-08 NOTE — Progress Notes (Signed)
Date and time results received: 01/08/21  2150   Test: Lactic acid Critical Value: 3.0  Name of Provider Notified: John RN E-LINK/ Dr. Oletta Darter  Orders Received? Orders given for 1 Liter bolus

## 2021-01-08 NOTE — H&P (View-Only) (Signed)
Patient ID: Thomas Chung, male   DOB: Aug 06, 1941, 80 y.o.   MRN: [id number]   Acute Care Surgery Service Progress Note:    Chief Complaint/Subjective: No fever. No tachy Ct reviewed.  Pt intubated  Objective: Vital signs in last 24 hours: Temp:  [98 F (36.7 C)-98.5 F (36.9 C)] 98.3 F (36.8 C) (02/19 0843) Pulse Rate:  [81-91] 81 (02/19 0800) Resp:  [16-30] 28 (02/19 0800) BP: (123-154)/(64-89) 123/89 (02/19 0800) SpO2:  [94 %-100 %] 97 % (02/19 0813) FiO2 (%):  [40 %-60 %] 40 % (02/19 0813) Weight:  [113.4 kg] 113.4 kg (02/19 0630) Last BM Date: 01/03/21  Intake/Output from previous day: 02/18 0701 - 02/19 0700 In: 2135.5 [I.V.:1783.3; IV Piggyback:352.3] Out: 650 [Urine:650] Intake/Output this shift: Total I/O In: 763 [I.V.:162.7; IV Piggyback:600.2] Out: 470 [Urine:400; Emesis/NG output:70]  Lungs: intubated,  nonlabored  Cardiovascular: reg  Abd: soft, not rigid/firm;   Extremities: no edema, +SCDs, blue toes, I can palpate a pulse DP in Rt foot  Neuro: intubated/sedated  Lab Results: CBC  Recent Labs    01/07/21 0522 01/08/21 0242  WBC 36.8* 33.8*  HGB 14.2 12.5*  HCT 44.3 39.2  PLT 139* 225   BMET Recent Labs    01/07/21 0757 01/08/21 0242  NA 143 142  K 4.6 5.2*  CL 106 109  CO2 23 20*  GLUCOSE 361* 323*  BUN 77* 92*  CREATININE 2.63* 3.10*  CALCIUM 7.9* 7.6*   LFT Hepatic Function Latest Ref Rng & Units 01/06/2021 01/05/2021 01/04/2021  Total Protein 6.5 - 8.1 g/dL 6.0(L) 6.2(L) 5.8(L)  Albumin 3.5 - 5.0 g/dL 3.1(L) 3.3(L) 3.1(L)  AST 15 - 41 U/L 78(H) 78(H) 60(H)  ALT 0 - 44 U/L 73(H) 62(H) 49(H)  Alk Phosphatase 38 - 126 U/L 164(H) 118 61  Total Bilirubin 0.3 - 1.2 mg/dL 1.6(H) 1.0 0.7  Bilirubin, Direct 0.0 - 0.3 mg/dL - - -   PT/INR No results for input(s): LABPROT, INR in the last 72 hours. ABG Recent Labs    01/06/21 1110 01/07/21 0520  PHART 7.358 7.399  HCO3 23.5 21.3     Studies/Results:  Anti-infectives: Anti-infectives (From admission, onward)   Start     Dose/Rate Route Frequency Ordered Stop   01/07/21 2200  piperacillin-tazobactam (ZOSYN) IVPB 3.375 g  Status:  Discontinued       Note to Pharmacy: Pharmacy may modify dose for renal dysfunction. Pharmacy to manage and monitor antibiotic administration.   3.375 g 100 mL/hr over 30 Minutes Intravenous Every 8 hours 01/07/21 2014 01/07/21 2017   01/07/21 2200  piperacillin-tazobactam (ZOSYN) IVPB 3.375 g        3.375 g 12.5 mL/hr over 240 Minutes Intravenous Every 8 hours 01/07/21 2018     01/07/21 1030  vancomycin (VANCOREADY) IVPB 2000 mg/400 mL        2,000 mg 200 mL/hr over 120 Minutes Intravenous  Once 01/07/21 0929 01/07/21 1327   01/07/21 1000  ceFEPIme (MAXIPIME) 2 g in sodium chloride 0.9 % 100 mL IVPB  Status:  Discontinued        2 g 200 mL/hr over 30 Minutes Intravenous Every 12 hours 01/07/21 0928 01/07/21 2010   01/07/21 0930  vancomycin variable dose per unstable renal function (pharmacist dosing)         Does not apply See admin instructions 01/07/21 0930     01/02/21 1000  remdesivir 100 mg in sodium chloride 0.9 % 100 mL IVPB       "  Followed by" Linked Group Details   100 mg 200 mL/hr over 30 Minutes Intravenous Daily 01/05/2021 1845 01/05/21 0921   01/14/2021 2000  remdesivir 200 mg in sodium chloride 0.9% 250 mL IVPB       "Followed by" Linked Group Details   200 mg 580 mL/hr over 30 Minutes Intravenous Once 01/04/2021 1845 12/23/2020 2017      Medications: Scheduled Meds: . chlorhexidine gluconate (MEDLINE KIT)  15 mL Mouth Rinse BID  . Chlorhexidine Gluconate Cloth  6 each Topical Daily  . docusate  100 mg Per Tube BID  . enoxaparin (LOVENOX) injection  30 mg Subcutaneous Q24H  . insulin aspart  0-9 Units Subcutaneous Q4H  . ipratropium-albuterol  3 mL Nebulization Q6H  . lidocaine  1 application Other Once  . mouth rinse  15 mL Mouth Rinse 10 times per day  .  methylPREDNISolone (SOLU-MEDROL) injection  60 mg Intravenous Q12H  . ondansetron (ZOFRAN) IV  4 mg Intravenous Once  . pantoprazole (PROTONIX) IV  40 mg Intravenous Q12H  . polyethylene glycol  17 g Per Tube Daily  . vancomycin variable dose per unstable renal function (pharmacist dosing)   Does not apply See admin instructions   Continuous Infusions: . sodium chloride Stopped (01/08/21 0627)  . fentaNYL infusion INTRAVENOUS 25 mcg/hr (01/08/21 0857)  . levETIRAcetam    . levETIRAcetam    . piperacillin-tazobactam (ZOSYN)  IV 12.5 mL/hr at 01/08/21 0857  . propofol (DIPRIVAN) infusion 25 mcg/kg/min (01/08/21 0857)   PRN Meds:.acetaminophen (TYLENOL) oral liquid 160 mg/5 mL, alum & mag hydroxide-simeth, fentaNYL, fentaNYL (SUBLIMAZE) injection, hydrALAZINE, labetalol, morphine injection, ondansetron (ZOFRAN) IV  Assessment/Plan: Patient Active Problem List   Diagnosis Date Noted  . Acute respiratory disease due to COVID-19 virus 01/02/2021  . Elevated serum creatinine 01/02/2021  . Pneumonia due to COVID-19 virus 01/06/2021  . COVID-19 12/27/2020  . Gout 08/05/2020  . Need for hepatitis C screening test 08/05/2020  . Onychomycosis of multiple toenails with type 2 diabetes mellitus ([place]) 08/06/2019  . 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 B12 deficiency 10/31/2018  . Thrombocytosis 10/29/2018  . GERD without esophagitis 01/22/2018  . Asymptomatic gallstones 09/09/2015  . Routine general medical examination at a health care facility 02/10/2015  . Prostate nodule with urinary obstruction 07/17/2011  . Type II diabetes mellitus with manifestations ([place]) 08/05/2009  . Diabetic neuropathy, painful ([place]) 07/06/2009  . Hyperlipidemia with target LDL less than 100 04/22/2009  . Essential hypertension, benign 04/22/2009  . Erectile dysfunction associated with type 2 diabetes mellitus ([place]) 04/21/2009  COVID pneumonia with VDRF with likely HCAP as well  - intubated yesterday  -  solumedrol/remdexivir/tocilizumab - started on vanc/cefepime  - WBC 10.9>26.1>31.8>36.8>33 Worsening renal failure - Cr 3 from 2.63 from 2.51   Peripheral vascular compromise - hypoperfusion noted  Pancreaticinsufficiency - On Creon at home, currently on hold Type 2 diabetes Hypertension Hyperlipidemia Gout Hx B12 deficiency 20 pack yr hx of tobacco use, none since age 63  Ileus, distal SB enteritis, ?pneumatosis  Ct reviewed Discussed with Dr Brantley Stage Discussed with CCM - uop improving but Cr up, vent settings not severe  There is some probable enteritis in the distal small bowel and perhaps some early ischemia however there is no free air.  There is no portal venous gas.  There is questionable pneumatosis.  He is not febrile.  He is not tachycardic.  His lactate is currently stable.  His abdomen is soft.  I think right now he is stable  with respect to his abdomen.  I did discuss his overall clinical picture with Dr. Loanne Drilling with CCM.  His ventilator settings are not high.  He is not requiring prone ventilation.  She feels that his renal function is stable or perhaps may be mildly improving since his urine output is increased even though his creatinine is increasing.  I think it is reasonable to offer laparotomy should he become febrile, worsening lactic acidosis, hypotensive.  He would still be extremely high risk.  We will continue to monitor.  I discussed the case with Dr. Brantley Stage and we both agree with this plan Disposition:  LOS: 7 days    Leighton Ruff. Redmond Pulling, MD, FACS General, Bariatric, & Minimally Invasive Surgery [phone] [facility]

## 2021-01-09 ENCOUNTER — Inpatient Hospital Stay (HOSPITAL_COMMUNITY): Payer: Medicare HMO | Admitting: Certified Registered Nurse Anesthetist

## 2021-01-09 ENCOUNTER — Inpatient Hospital Stay: Payer: Self-pay

## 2021-01-09 ENCOUNTER — Encounter (HOSPITAL_COMMUNITY): Admission: EM | Disposition: E | Payer: Self-pay | Source: Home / Self Care | Attending: Internal Medicine

## 2021-01-09 DIAGNOSIS — U071 COVID-19: Secondary | ICD-10-CM | POA: Diagnosis not present

## 2021-01-09 DIAGNOSIS — R569 Unspecified convulsions: Secondary | ICD-10-CM | POA: Diagnosis not present

## 2021-01-09 DIAGNOSIS — K219 Gastro-esophageal reflux disease without esophagitis: Secondary | ICD-10-CM | POA: Diagnosis not present

## 2021-01-09 DIAGNOSIS — E119 Type 2 diabetes mellitus without complications: Secondary | ICD-10-CM | POA: Diagnosis not present

## 2021-01-09 DIAGNOSIS — K559 Vascular disorder of intestine, unspecified: Secondary | ICD-10-CM | POA: Diagnosis not present

## 2021-01-09 DIAGNOSIS — J069 Acute upper respiratory infection, unspecified: Secondary | ICD-10-CM | POA: Diagnosis not present

## 2021-01-09 DIAGNOSIS — I10 Essential (primary) hypertension: Secondary | ICD-10-CM | POA: Diagnosis not present

## 2021-01-09 HISTORY — PX: LAPAROSCOPY: SHX197

## 2021-01-09 LAB — COMPREHENSIVE METABOLIC PANEL
ALT: 43 U/L (ref 0–44)
AST: 28 U/L (ref 15–41)
Albumin: 2.7 g/dL — ABNORMAL LOW (ref 3.5–5.0)
Alkaline Phosphatase: 138 U/L — ABNORMAL HIGH (ref 38–126)
Anion gap: 14 (ref 5–15)
BUN: 105 mg/dL — ABNORMAL HIGH (ref 8–23)
CO2: 20 mmol/L — ABNORMAL LOW (ref 22–32)
Calcium: 7.6 mg/dL — ABNORMAL LOW (ref 8.9–10.3)
Chloride: 110 mmol/L (ref 98–111)
Creatinine, Ser: 2.98 mg/dL — ABNORMAL HIGH (ref 0.61–1.24)
GFR, Estimated: 21 mL/min — ABNORMAL LOW (ref >60)
Glucose, Bld: 255 mg/dL — ABNORMAL HIGH (ref 70–99)
Potassium: 4.2 mmol/L (ref 3.5–5.1)
Sodium: 144 mmol/L (ref 135–145)
Total Bilirubin: 1.1 mg/dL (ref 0.3–1.2)
Total Protein: 5.4 g/dL — ABNORMAL LOW (ref 6.5–8.1)

## 2021-01-09 LAB — CBC
HCT: 40.1 % (ref 39.0–52.0)
Hemoglobin: 12.7 g/dL — ABNORMAL LOW (ref 13.0–17.0)
MCH: 25.6 pg — ABNORMAL LOW (ref 26.0–34.0)
MCHC: 31.7 g/dL (ref 30.0–36.0)
MCV: 80.7 fL (ref 80.0–100.0)
Platelets: 265 10*3/uL (ref 150–400)
RBC: 4.97 MIL/uL (ref 4.22–5.81)
RDW: 16.8 % — ABNORMAL HIGH (ref 11.5–15.5)
WBC: 44.3 10*3/uL — ABNORMAL HIGH (ref 4.0–10.5)
nRBC: 1.2 % — ABNORMAL HIGH (ref 0.0–0.2)

## 2021-01-09 LAB — GLUCOSE, CAPILLARY
Glucose-Capillary: 211 mg/dL — ABNORMAL HIGH (ref 70–99)
Glucose-Capillary: 246 mg/dL — ABNORMAL HIGH (ref 70–99)
Glucose-Capillary: 223 mg/dL — ABNORMAL HIGH (ref 70–99)
Glucose-Capillary: 244 mg/dL — ABNORMAL HIGH (ref 70–99)
Glucose-Capillary: 246 mg/dL — ABNORMAL HIGH (ref 70–99)

## 2021-01-09 LAB — VANCOMYCIN, RANDOM: Vancomycin Rm: 11

## 2021-01-09 LAB — LACTIC ACID, PLASMA
Lactic Acid, Venous: 3.6 mmol/L (ref 0.5–1.9)
Lactic Acid, Venous: 2.2 mmol/L (ref 0.5–1.9)

## 2021-01-09 SURGERY — LAPAROSCOPY, DIAGNOSTIC
Anesthesia: General | Site: Abdomen

## 2021-01-09 MED ORDER — 0.9 % SODIUM CHLORIDE (POUR BTL) OPTIME
TOPICAL | Status: DC | PRN
  Administered 2021-01-09: 2000 mL
  Administered 2021-01-09: 5000 mL

## 2021-01-09 MED ORDER — KETAMINE HCL 10 MG/ML IJ SOLN
INTRAMUSCULAR | Status: AC
  Filled 2021-01-09: qty 1

## 2021-01-09 MED ORDER — FENTANYL CITRATE (PF) 100 MCG/2ML IJ SOLN
INTRAMUSCULAR | Status: DC | PRN
  Administered 2021-01-09: 100 ug via INTRAVENOUS

## 2021-01-09 MED ORDER — ROCURONIUM BROMIDE 10 MG/ML (PF) SYRINGE
PREFILLED_SYRINGE | INTRAVENOUS | Status: DC | PRN
  Administered 2021-01-09: 70 mg via INTRAVENOUS

## 2021-01-09 MED ORDER — ROCURONIUM BROMIDE 10 MG/ML (PF) SYRINGE
PREFILLED_SYRINGE | INTRAVENOUS | Status: AC
  Filled 2021-01-09: qty 10

## 2021-01-09 MED ORDER — EPHEDRINE 5 MG/ML INJ
INTRAVENOUS | Status: AC
  Filled 2021-01-09: qty 10

## 2021-01-09 MED ORDER — LACTATED RINGERS IV SOLN
INTRAVENOUS | Status: AC | PRN
  Administered 2021-01-09: 1000 mL

## 2021-01-09 MED ORDER — VANCOMYCIN HCL 1500 MG/300ML IV SOLN
1500.0000 mg | INTRAVENOUS | Status: DC
  Administered 2021-01-09: 1500 mg via INTRAVENOUS
  Filled 2021-01-09: qty 300

## 2021-01-09 MED ORDER — SUCCINYLCHOLINE CHLORIDE 200 MG/10ML IV SOSY
PREFILLED_SYRINGE | INTRAVENOUS | Status: AC
  Filled 2021-01-09: qty 10

## 2021-01-09 MED ORDER — KETAMINE HCL 10 MG/ML IJ SOLN
INTRAMUSCULAR | Status: DC | PRN
  Administered 2021-01-09: 30 mg via INTRAVENOUS

## 2021-01-09 MED ORDER — PANTOPRAZOLE SODIUM 40 MG IV SOLR
40.0000 mg | INTRAVENOUS | Status: DC
  Administered 2021-01-10 – 2021-01-15 (×6): 40 mg via INTRAVENOUS
  Filled 2021-01-09 (×6): qty 40

## 2021-01-09 MED ORDER — PROPOFOL 10 MG/ML IV BOLUS
INTRAVENOUS | Status: DC | PRN
  Administered 2021-01-09: 50 mg via INTRAVENOUS

## 2021-01-09 MED ORDER — ACETAMINOPHEN 650 MG RE SUPP
650.0000 mg | Freq: Four times a day (QID) | RECTAL | Status: DC | PRN
  Administered 2021-01-12 (×2): 650 mg via RECTAL
  Filled 2021-01-09 (×2): qty 1

## 2021-01-09 MED ORDER — PHENYLEPHRINE 40 MCG/ML (10ML) SYRINGE FOR IV PUSH (FOR BLOOD PRESSURE SUPPORT)
PREFILLED_SYRINGE | INTRAVENOUS | Status: AC
  Filled 2021-01-09: qty 10

## 2021-01-09 MED ORDER — ONDANSETRON HCL 4 MG/2ML IJ SOLN
INTRAMUSCULAR | Status: AC
  Filled 2021-01-09: qty 2

## 2021-01-09 MED ORDER — DEXAMETHASONE SODIUM PHOSPHATE 10 MG/ML IJ SOLN
INTRAMUSCULAR | Status: AC
  Filled 2021-01-09: qty 1

## 2021-01-09 MED ORDER — FENTANYL CITRATE (PF) 100 MCG/2ML IJ SOLN
INTRAMUSCULAR | Status: AC
  Filled 2021-01-09: qty 2

## 2021-01-09 MED ORDER — SODIUM CHLORIDE 0.9% FLUSH
10.0000 mL | INTRAVENOUS | Status: DC | PRN
  Administered 2021-01-09: 10 mL

## 2021-01-09 MED ORDER — SODIUM CHLORIDE 0.9% FLUSH
10.0000 mL | Freq: Two times a day (BID) | INTRAVENOUS | Status: DC
  Administered 2021-01-09 – 2021-01-15 (×8): 10 mL

## 2021-01-09 SURGICAL SUPPLY — 67 items
ADH SKN CLS APL DERMABOND .7 (GAUZE/BANDAGES/DRESSINGS)
APL PRP STRL LF DISP 70% ISPRP (MISCELLANEOUS) ×1
APPLIER CLIP 5 13 M/L LIGAMAX5 (MISCELLANEOUS)
APPLIER CLIP ROT 10 11.4 M/L (STAPLE)
APR CLP MED LRG 11.4X10 (STAPLE)
APR CLP MED LRG 5 ANG JAW (MISCELLANEOUS)
BLADE EXTENDED COATED 6.5IN (ELECTRODE) ×1 IMPLANT
BLADE SURG SZ10 CARB STEEL (BLADE) IMPLANT
CELLS DAT CNTRL 66122 CELL SVR (MISCELLANEOUS) IMPLANT
CHLORAPREP W/TINT 26 (MISCELLANEOUS) ×2 IMPLANT
CLIP APPLIE 5 13 M/L LIGAMAX5 (MISCELLANEOUS) IMPLANT
CLIP APPLIE ROT 10 11.4 M/L (STAPLE) IMPLANT
COVER MAYO STAND STRL (DRAPES) IMPLANT
COVER SURGICAL LIGHT HANDLE (MISCELLANEOUS) ×2 IMPLANT
COVER WAND RF STERILE (DRAPES) IMPLANT
DECANTER SPIKE VIAL GLASS SM (MISCELLANEOUS) ×2 IMPLANT
DERMABOND ADVANCED (GAUZE/BANDAGES/DRESSINGS)
DERMABOND ADVANCED .7 DNX12 (GAUZE/BANDAGES/DRESSINGS) IMPLANT
DRAPE SHEET LG 3/4 BI-LAMINATE (DRAPES) IMPLANT
DRAPE WARM FLUID 44X44 (DRAPES) ×1 IMPLANT
DRSG TEGADERM 2-3/8X2-3/4 SM (GAUZE/BANDAGES/DRESSINGS) ×1 IMPLANT
ELECT REM PT RETURN 15FT ADLT (MISCELLANEOUS) ×2 IMPLANT
GAUZE SPONGE 2X2 8PLY STRL LF (GAUZE/BANDAGES/DRESSINGS) IMPLANT
GAUZE SPONGE 4X4 12PLY STRL (GAUZE/BANDAGES/DRESSINGS) ×2 IMPLANT
GLOVE INDICATOR 8.0 STRL GRN (GLOVE) ×2 IMPLANT
GLOVE SURG ENC TEXT LTX SZ7.5 (GLOVE) ×2 IMPLANT
GOWN STRL REUS W/TWL XL LVL3 (GOWN DISPOSABLE) ×8 IMPLANT
HANDLE SUCTION POOLE (INSTRUMENTS) IMPLANT
IRRIG SUCT STRYKERFLOW 2 WTIP (MISCELLANEOUS)
IRRIGATION SUCT STRKRFLW 2 WTP (MISCELLANEOUS) IMPLANT
KIT BASIN OR (CUSTOM PROCEDURE TRAY) ×2 IMPLANT
KIT TURNOVER KIT A (KITS) ×2 IMPLANT
LEGGING LITHOTOMY PAIR STRL (DRAPES) IMPLANT
LIGASURE IMPACT 36 18CM CVD LR (INSTRUMENTS) ×1 IMPLANT
RELOAD PROXIMATE 75MM BLUE (ENDOMECHANICALS) ×6 IMPLANT
RELOAD STAPLE 75 3.8 BLU REG (ENDOMECHANICALS) IMPLANT
RETRACTOR WND ALEXIS 18 MED (MISCELLANEOUS) IMPLANT
RTRCTR WOUND ALEXIS 18CM MED (MISCELLANEOUS)
SCISSORS LAP 5X35 DISP (ENDOMECHANICALS) ×2 IMPLANT
SHEARS HARMONIC ACE PLUS 36CM (ENDOMECHANICALS) IMPLANT
SLEEVE XCEL OPT CAN 5 100 (ENDOMECHANICALS) ×2 IMPLANT
SPONGE ABD ABTHERA ADVANCE (MISCELLANEOUS) ×1 IMPLANT
SPONGE GAUZE 2X2 STER 10/PKG (GAUZE/BANDAGES/DRESSINGS) ×1
SPONGE LAP 18X18 RF (DISPOSABLE) IMPLANT
STAPLER PROXIMATE 75MM BLUE (STAPLE) ×1 IMPLANT
STAPLER VISISTAT 35W (STAPLE) IMPLANT
STRIP CLOSURE SKIN 1/2X4 (GAUZE/BANDAGES/DRESSINGS) IMPLANT
SUCTION POOLE HANDLE (INSTRUMENTS) ×2
SUT PDS AB 1 TP1 96 (SUTURE) IMPLANT
SUT PROLENE 2 0 KS (SUTURE) IMPLANT
SUT PROLENE 2 0 SH DA (SUTURE) IMPLANT
SUT SILK 2 0 (SUTURE)
SUT SILK 2 0 SH CR/8 (SUTURE) ×1 IMPLANT
SUT SILK 2-0 18XBRD TIE 12 (SUTURE) IMPLANT
SUT SILK 3 0 (SUTURE)
SUT SILK 3 0 SH CR/8 (SUTURE) ×1 IMPLANT
SUT SILK 3-0 18XBRD TIE 12 (SUTURE) IMPLANT
SYR BULB IRRIG 60ML STRL (SYRINGE) IMPLANT
SYS LAPSCP GELPORT 120MM (MISCELLANEOUS)
SYSTEM LAPSCP GELPORT 120MM (MISCELLANEOUS) IMPLANT
TOWEL OR 17X26 10 PK STRL BLUE (TOWEL DISPOSABLE) ×2 IMPLANT
TOWEL OR NON WOVEN STRL DISP B (DISPOSABLE) ×2 IMPLANT
TRAY FOLEY MTR SLVR 16FR STAT (SET/KITS/TRAYS/PACK) ×2 IMPLANT
TRAY LAPAROSCOPIC (CUSTOM PROCEDURE TRAY) ×2 IMPLANT
TROCAR BLADELESS OPT 5 100 (ENDOMECHANICALS) ×2 IMPLANT
TROCAR XCEL NON-BLD 11X100MML (ENDOMECHANICALS) IMPLANT
YANKAUER SUCT BULB TIP NO VENT (SUCTIONS) ×1 IMPLANT

## 2021-01-09 NOTE — Progress Notes (Signed)
NAME:  Thomas Chung, MRN:  [id number], DOB:  [birth year], LOS: 63 ADMISSION DATE:  12/26/2020, CONSULTATION DATE: 01/06/2019 REFERRING MD: [facility], CHIEF COMPLAINT: Acute hypoxic respiratory failure  Brief History:  80 year old with history of Covid on 12/22/2020 who defervesced and required intubation on 01/06/2021  History of Present Illness:  80 year old former smoker quit when he was 67 he has a past medical history for Covid on 12/22/2020, along with type 2 diabetes with neuropathy pancreatic insufficiency hypertension history of gastroesophageal reflux disease.  He was admitted on 12/21/2020 with worsening respiratory distress and suspected ileus.  On 01/04/2021 had increasing oxygen demand demands and by 01/06/2021 was on 100% 35 L flow of oxygen pulmonary critical care was asked to evaluate and he was intubated at that time.  He has completed antiviral therapy.  He remains on Solu-Medrol and Acterma.  Currently is on full mechanical ventilatory support ARDS protocol his wife and daughter have been updated via the phone.  Past Medical History:   Past Medical History:  Diagnosis Date  . Depression   . Fatty liver   . Glucose intolerance (impaired glucose tolerance)   . HOH (hard of hearing)    bilateral hearing aides  . Hx of colonic polyps   . Hyperlipidemia   . Hypertension   . Neuromuscular disorder ([place])    neuropathy feet    Significant Hospital Events:  01/06/2021 intubated 2/18 improving oxygenation however persistent lactic acidosis and abdomen exam concerning for peritonitis. CT abdomen showed possible enteritis/ischemia. Gen surgery consulted. Also had episode of seizure like activity. Neuro consulted 2/19 Cr peaked. UOP improving. FIO2 40% 2/20 Worsening lactic acidosis concerning for bowel ischemia. Gen Surgery discussing surgery with family  Consults:  General surgery Critical care  Procedures:  01/06/2021 intubation 17 2022 NG tube left nares>>  Significant  Diagnostic Tests:  2/18 ct abd>>segmental enteritis in right lower quadrant concerning for early ischemia. No bowel obstruction. Splenomegaly with findings for possible hypoperfusion/early infarct. Basilar lung with consolidation  Micro Data:  01/07/2020 sputum>> 01/07/2021 blood cultures x2>> 01/07/2021 urine culture>>  Antimicrobials:  01/07/2021 vancomycin>> 01/07/2021 cefepime>>  Interim History / Subjective:  Worsening lactic acidosis and leukocytosis concerning for bowel ischemia/infection. Gen Surgery discussing surgery with family  No further seizure episodes seen  Objective   Blood pressure (!) 158/72, pulse 67, temperature (!) 96.8 F (36 C), temperature source Axillary, resp. rate (!) 28, height 6\' 4"  (1.93 m), weight 113.4 kg, SpO2 92 %.    Vent Mode: PRVC FiO2 (%):  [40 %-50 %] 50 % Set Rate:  [26 bmp] 26 bmp Vt Set:  [610 mL] 610 mL PEEP:  [5 cmH20-8 cmH20] 5 cmH20 Plateau Pressure:  [14 cmH20-24 cmH20] 20 cmH20   Intake/Output Summary (Last 24 hours) at 12/26/2020 0756 Last data filed at 01/12/2021 0755 Gross per 24 hour  Intake 4847.92 ml  Output 1410 ml  Net 3437.92 ml   Filed Weights   01/06/2021 2306 01/05/21 1141 01/08/21 0630  Weight: 116.3 kg 114.5 kg 113.4 kg    Physical Exam: General:Elderly-appearing, sedated HENT: [place], AT, ETT in place Eyes: EOMI, no scleral icterus Respiratory: Clear to auscultation bilaterally.  No crackles, wheezing or rales Cardiovascular: RRR, -M/R/G, no JVD GI: Hypoactive, firm, grimaces with palpation Extremities: Dusky toes, diminished PPP Neuro: Sedated GU: Foley in place   Resolved Hospital Problem list     Assessment & Plan:   Acute hypoxemic respiratory failure/ARDS secondary to ERXVQ-00 complicated by possible bacterial pneumonia Tested positive for Covid  on 12/22/2020 he was unvaccinated for COVID-19. Respiratory status improving and nearing minimal vent requirement. Plan:  --S/p remdesivir 2/12-2/16 and actemra  2/16 --Continue steroids for 10 day course (end 2/21) --ARDSnet ventilator protocol. Wean FIO2 and PEEP for goal SpO2 88-95%.  --Continue broad spectrum antibiotics.   Sepsis secondary to segmental enteritis/ischemia - worsening leukocytosis, lactic acidosis Initially concerning for co-comitant bacterial pneumonia however with improved oxygenation his leukocytosis and lactic acidosis is likely related to abdominal source. CT reported enteritis concerning for early ischemia. No bowel obstruction seen. Plan: --General surgery planning for ex-lap for possible bowel resection --Maintenance NS @ 75cc/hr --Trend LA. Give additional IVF fluid bolus PRN  Seizure-like activity  CT head neg. Neuro consulted yesterday. Plan: --Appreciate Neuro input. AEDs per consult team --Plan for EEG/LTM. Will need to transfer to [facility]  AKI - Improving UOP/Cr Hyperkalemia - resolved Metabolic acidosis Plan: --Trend BMP --Monitor UOP/Cr  Peripheral vascular compromise with bilateral lower extremities with blue toes noted Areas of hypoperfusion are marked. Requiring dopplers to find distal pulses. ABI wnl  Hypertension --Off Cardene --Continue to monitor blood pressure  Diabetes mellitus type 2 CBG (last 3)  Recent Labs    01/08/21 1953 01/08/21 2322 01/02/2021 0311  GLUCAP 242* 189* 211*   Sliding-scale insulin protocol  History of pancreatic insufficiency Creon is on hold   Best practice (evaluated daily)  Diet: NPO Pain/Anxiety/Delirium protocol (if indicated): PAD protocol VAP protocol (if indicated): Yes DVT prophylaxis: PPI Glucose control: Sliding scale insulin protocol Mobility: Bedrest Disposition: Intensive care unit Updated wife and daughter on 01/14/2021  Goals of Care:  Last date of multidisciplinary goals of care discussion: 01/06/2021 via phone Family and staff present: 01/06/2021 spoke with wife and daughter made them aware of impending intubation.Updated in person 2/17 per  MD Summary of discussion: Agree they want intubation if necessary. Follow up goals of care discussion due:2/24 Code Status: Full code  Labs   CBC: Recent Labs  Lab 01/03/21 0253 01/04/21 0421 01/05/21 6269 01/06/21 0258 01/07/21 0522 01/08/21 0242 12/28/2020 0535  WBC 6.8 10.9* 26.1* 31.8* 36.8* 33.8* 44.3*  NEUTROABS 5.9 9.6* 21.9* 27.7*  --  30.0*  --   HGB 13.8 14.1 15.1 14.4 14.2 12.5* 12.7*  HCT 42.3 43.8 46.4 44.3 44.3 39.2 40.1  MCV 78.6* 79.1* 78.0* 78.0* 80.4 80.8 80.7  PLT 339 354 290 220 139* 225 485    Basic Metabolic Panel: Recent Labs  Lab 01/06/21 0258 01/07/21 0522 01/07/21 0757 01/08/21 0242 01/08/21 1134 01/14/2021 0315  NA 142 142 143 142 145 144  K 4.3 5.7* 4.6 5.2* 4.6 4.2  CL 108 108 106 109 110 110  CO2 26 21* 23 20* 21* 20*  GLUCOSE 268* 328* 361* 323* 314* 255*  BUN 41* 69* 77* 92* 98* 105*  CREATININE 1.41* 2.51* 2.63* 3.10* 3.16* 2.98*  CALCIUM 8.0* 7.8* 7.9* 7.6* 7.8* 7.6*  MG 2.7*  --   --  3.3*  --   --   PHOS 2.3*  --   --  3.7  --   --    GFR: Estimated Creatinine Clearance: 27.7 mL/min (A) (by C-G formula based on SCr of 2.98 mg/dL (H)). Recent Labs  Lab 01/06/21 0258 01/06/21 4627 01/07/21 0522 01/07/21 0801 01/08/21 0242 01/08/21 0908 01/08/21 1445 01/08/21 2052 01/14/2021 0300 12/23/2020 0535  PROCALCITON  --  0.43  --   --   --   --   --   --   --   --  WBC 31.8*  --  36.8*  --  33.8*  --   --   --   --  44.3*  LATICACIDVEN  --   --   --    < > 3.2* 3.1* 2.9* 3.0* 3.6*  --    < > = values in this interval not displayed.    Liver Function Tests: Recent Labs  Lab 01/03/21 0253 01/04/21 0421 01/05/21 0438 01/06/21 0258 12/21/2020 0315  AST 54* 60* 78* 78* 28  ALT 41 49* 62* 73* 43  ALKPHOS 50 61 118 164* 138*  BILITOT 0.8 0.7 1.0 1.6* 1.1  PROT 5.9* 5.8* 6.2* 6.0* 5.4*  ALBUMIN 3.1* 3.1* 3.3* 3.1* 2.7*   No results for input(s): LIPASE, AMYLASE in the last 168 hours. No results for input(s): AMMONIA in the last  168 hours.  ABG    Component Value Date/Time   PHART 7.399 01/07/2021 0520   PCO2ART 35.1 01/07/2021 0520   PO2ART 64.3 (L) 01/07/2021 0520   HCO3 21.3 01/07/2021 0520   ACIDBASEDEF 2.4 (H) 01/07/2021 0520   O2SAT 92.1 01/07/2021 0520     Coagulation Profile: No results for input(s): INR, PROTIME in the last 168 hours.  Cardiac Enzymes: No results for input(s): CKTOTAL, CKMB, CKMBINDEX, TROPONINI in the last 168 hours.  HbA1C: Hgb A1c MFr Bld  Date/Time Value Ref Range Status  11/04/2020 02:29 PM 6.7 (H) 4.6 - 6.5 % Final    Comment:    Glycemic Control Guidelines for People with Diabetes:Non Diabetic:  <6%Goal of Therapy: <7%Additional Action Suggested:  >8%   08/05/2020 01:45 PM 6.5 (H) <5.7 % of total Hgb Final    Comment:    For someone without known diabetes, a hemoglobin A1c value of 6.5% or greater indicates that they may have  diabetes and this should be confirmed with a follow-up  test. . For someone with known diabetes, a value <7% indicates  that their diabetes is well controlled and a value  greater than or equal to 7% indicates suboptimal  control. A1c targets should be individualized based on  duration of diabetes, age, comorbid conditions, and  other considerations. . Currently, no consensus exists regarding use of hemoglobin A1c for diagnosis of diabetes for children. .     CBG: Recent Labs  Lab 01/08/21 1203 01/08/21 1630 01/08/21 1953 01/08/21 2322 01/17/2021 0311  GLUCAP 271* 270* 242* 189* 211*      Critical care time: 41 min    The patient is critically ill with multiple organ systems failure and requires high complexity decision making for assessment and support, frequent evaluation and titration of therapies, application of advanced monitoring technologies and extensive interpretation of multiple databases.  Independent Critical Care Time: 41 Minutes.   Rodman Pickle, M.D. [facility] Pulmonary/Critical Care Medicine 12/30/2020 7:56 AM    Please see Amion for pager number to reach on-call Pulmonary and Critical Care Team.

## 2021-01-09 NOTE — Plan of Care (Signed)
  Problem: Respiratory: Goal: Will maintain a patent airway Outcome: Progressing Goal: Complications related to the disease process, condition or treatment will be avoided or minimized Outcome: Progressing   Problem: Clinical Measurements: Goal: Ability to maintain clinical measurements within normal limits will improve Outcome: Progressing Goal: Respiratory complications will improve Outcome: Progressing Goal: Cardiovascular complication will be avoided Outcome: Progressing   Problem: Pain Managment: Goal: General experience of comfort will improve Outcome: Progressing   Problem: Safety: Goal: Ability to remain free from injury will improve Outcome: Progressing   Problem: Skin Integrity: Goal: Risk for impaired skin integrity will decrease Outcome: Progressing   Problem: Clinical Measurements: Goal: Will remain free from infection Outcome: Not Progressing Goal: Diagnostic test results will improve Outcome: Not Progressing   Problem: Nutrition: Goal: Adequate nutrition will be maintained Outcome: Not Progressing   Problem: Elimination: Goal: Will not experience complications related to bowel motility Outcome: Not Progressing Goal: Will not experience complications related to urinary retention Outcome: Not Progressing

## 2021-01-09 NOTE — Anesthesia Preprocedure Evaluation (Addendum)
Anesthesia Evaluation  Patient identified by MRN, date of birth, ID band Patient unresponsive    Reviewed: Allergy & Precautions, H&P , Patient's Chart, lab work & pertinent test results, Unable to perform ROS - Chart review only  Airway Mallampati: Intubated  TM Distance: >3 FB Neck ROM: Full    Dental no notable dental hx.    Pulmonary pneumonia, unresolved, former smoker,  covid pneumonia, Intubated for 4 days   Pulmonary exam normal breath sounds clear to auscultation (-) decreased breath sounds      Cardiovascular hypertension, Normal cardiovascular exam Rhythm:Regular Rate:Normal     Neuro/Psych negative neurological ROS  negative psych ROS   GI/Hepatic negative GI ROS, Neg liver ROS,   Endo/Other  diabetes, Type 2  Renal/GU Renal InsufficiencyRenal disease  negative genitourinary   Musculoskeletal negative musculoskeletal ROS (+)   Abdominal   Peds negative pediatric ROS (+)  Hematology negative hematology ROS (+)   Anesthesia Other Findings   Reproductive/Obstetrics negative OB ROS                            Anesthesia Physical Anesthesia Plan  ASA: IV and emergent  Anesthesia Plan: General   Post-op Pain Management:    Induction: Inhalational  PONV Risk Score and Plan: 0  Airway Management Planned: Oral ETT  Additional Equipment:   Intra-op Plan:   Post-operative Plan: Post-operative intubation/ventilation  Informed Consent: I have reviewed the patients History and Physical, chart, labs and discussed the procedure including the risks, benefits and alternatives for the proposed anesthesia with the patient or authorized representative who has indicated his/her understanding and acceptance.     Dental advisory given  Plan Discussed with: CRNA and Surgeon  Anesthesia Plan Comments:         Anesthesia Quick Evaluation

## 2021-01-09 NOTE — Progress Notes (Signed)
RT NOTE:  PT transported to OR via ventilator with no complications noted.

## 2021-01-09 NOTE — Progress Notes (Signed)
Peripherally Inserted Central Catheter Placement  The IV Nurse has discussed with the patient and/or persons authorized to consent for the patient, the purpose of this procedure and the potential benefits and risks involved with this procedure.  The benefits include less needle sticks, lab draws from the catheter, and the patient may be discharged home with the catheter. Risks include, but not limited to, infection, bleeding, blood clot (thrombus formation), and puncture of an artery; nerve damage and irregular heartbeat and possibility to perform a PICC exchange if needed/ordered by physician.  Alternatives to this procedure were also discussed.  Bard Power PICC patient education guide, fact sheet on infection prevention and patient information card has been provided to patient /or left at bedside.    PICC Placement Documentation  PICC Triple Lumen 01/04/2021 PICC Right Brachial 44 cm 0 cm (Active)  Indication for Insertion or Continuance of Line Administration of hyperosmolar/irritating solutions (i.e. TPN, Vancomycin, etc.) 01/06/2021 1809  Exposed Catheter (cm) 0 cm 01/13/2021 1809  Site Assessment Clean;Dry;Intact 01/02/2021 1809  Lumen #1 Status Flushed;Blood return noted;Saline locked 01/07/2021 1809  Lumen #2 Status Flushed;Blood return noted;Saline locked 12/26/2020 1809  Lumen #3 Status Flushed;Blood return noted;Saline locked 01/10/2021 1809  Dressing Type Transparent 12/24/2020 1809  Dressing Status Clean;Dry;Intact 12/31/2020 1809  Antimicrobial disc in place? Yes 12/27/2020 1809  Dressing Change Due 01/16/21 12/25/2020 1809   Wife signed consent   Scotty Court 12/21/2020, 6:11 PM

## 2021-01-09 NOTE — Progress Notes (Signed)
Pharmacy Antibiotic Note  Thomas Chung is a 80 y.o. male admitted on 01/02/2021 with COVID pneumonia, completed treatment with remdesivir, s/p actemra 2/16, remains on steroids.  Pharmacy has been consulted for vancomycin dosing for possible bacterial pneumonia. Also on Zosyn for bowel ischemia/infection.  Day #3 broad spectrum abx Tc 96.8 WBC 44.3 (solumedrol) SCr 2.98, CrCl ~27 ml/min Lactate rising 3.6  Plan: Vancomycin 2g IV x 1 on 2/18, continue with 1.5g IV q48h Continue Zosyn 3.375gm IV q8h (4hr extended infusions) per MD Follow up renal function & cultures  Height: 6\' 4"  (193 cm) Weight: 113.4 kg (250 lb) IBW/kg (Calculated) : 86.8  Temp (24hrs), Avg:97.4 F (36.3 C), Min:96.6 F (35.9 C), Max:98.3 F (36.8 C)  Recent Labs  Lab 01/05/21 0438 01/06/21 0258 01/07/21 0522 01/07/21 0757 01/07/21 0801 01/08/21 0242 01/08/21 0908 01/08/21 1134 01/08/21 1445 01/08/21 2052 12/28/2020 0300 12/27/2020 0315 01/05/2021 0535  WBC 26.1* 31.8* 36.8*  --   --  33.8*  --   --   --   --   --   --  44.3*  CREATININE 1.08 1.41* 2.51* 2.63*  --  3.10*  --  3.16*  --   --   --  2.98*  --   LATICACIDVEN  --   --   --   --    < > 3.2* 3.1*  --  2.9* 3.0* 3.6*  --   --   VANCORANDOM  --   --   --   --   --   --   --   --   --   --   --  11  --    < > = values in this interval not displayed.    Estimated Creatinine Clearance: 27.7 mL/min (A) (by C-G formula based on SCr of 2.98 mg/dL (H)).    Allergies  Allergen Reactions  . Amlodipine Swelling    Swelling in legs; no shortness of breath or tongue swelling.  Marland Kitchen Prevnar [Pneumococcal 13-Val Conj Vacc]     Joint pain, swelling    Antimicrobials this admission:  2/12 Remdesivir >> 2/16 2/16 Actemra 2/18 Vanc >> 2/18 Cefepime >>  Levels/dose adjustments this admission: 2/20: vanc random = 11, start 1.5g q48h per obesity nomogram  Microbiology results:  2/12 COVID+ 2/12 BCx: NGF 2/16 MRSA: neg 2/18 BCx: 1 of 3 gram variable  rod (no BCID) 2/18 UCx: 2/18 TA: abundant Klebsiella oxytoca (reincubating)  Thank you for allowing pharmacy to be a part of this patient's care.  Peggyann Juba, PharmD, BCPS Pharmacy: [phone] 12/27/2020 8:10 AM

## 2021-01-09 NOTE — Progress Notes (Addendum)
Critical lab  Lactic acid 3.6  Called to [place] and Dr. Oletta Darter at [phone] on 12/26/2020.  Advised by Dr. Oletta Darter at [phone] to page on call general surgery as the elevated lactic acid may be related to diagnosis of ischemic bowel. Paged surgeon as instructed at 0400 (Dr. Redmond Pulling). Dr. Redmond Pulling returned call at [place] and ordered CBC after reviewing current data with writer. No other interventions at this time.

## 2021-01-09 NOTE — Transfer of Care (Signed)
Immediate Anesthesia Transfer of Care Note  Patient: Thomas Chung  Procedure(s) Performed: Procedure(s): LAPAROSCOPY DIAGNOSTIC, exploratory laparotomy small bowel resection times two, placement abthera wound vac (N/A)  Patient Location: ICU  Anesthesia Type:General  Level of Consciousness: Alert, Awake, Oriented  Airway & Oxygen Therapy: Patient Spontanous Breathing  Post-op Assessment: Report given to RN  Post vital signs: Reviewed and stable  Last Vitals:  Vitals:   01/05/2021 0800 01/03/2021 0824  BP: (!) 161/69   Pulse: 67   Resp: (!) 27   Temp: (!) 36.1 C   SpO2: 11% 57%    Complications: No apparent anesthesia complications

## 2021-01-09 NOTE — Anesthesia Postprocedure Evaluation (Signed)
Anesthesia Post Note  Patient: Thomas Chung  Procedure(s) Performed: LAPAROSCOPY DIAGNOSTIC, exploratory laparotomy small bowel resection times two, placement abthera wound vac (N/A Abdomen)     Patient location during evaluation: SICU Anesthesia Type: General Level of consciousness: sedated Pain management: pain level controlled Vital Signs Assessment: post-procedure vital signs reviewed and stable Respiratory status: patient remains intubated per anesthesia plan Cardiovascular status: stable Postop Assessment: no apparent nausea or vomiting Anesthetic complications: no   No complications documented.  Last Vitals:  Vitals:   12/22/2020 1108 12/26/2020 1134  BP:    Pulse:    Resp:    Temp:  (!) 36.3 C  SpO2: 92%     Last Pain:  Vitals:   01/17/2021 1134  TempSrc: Axillary  PainSc:                  Zayleigh Stroh S

## 2021-01-09 NOTE — Progress Notes (Signed)
Patient transferred to 2 Heart room 21-C at [facility] via [place] care team at approximately 2115. Called patient's wife and spoke to she and daughter at 2119 to provide update, room number, and unit contact information.

## 2021-01-09 NOTE — Progress Notes (Signed)
Neurology Progress Note  S: No overnight events. No further twitching noted. Is on Propofol 40  Neurology was called to see the patient for twitching/tremoring episodes of face and right shoulder. He was loaded with 1550m of Keppra and placed on maintance dose of 7525mq12 hrs. No further witnessed seizure activity after this.  CT head showed no acute abnormality but chronic small vessel disease.   O: Current vital signs: BP 138/64   Pulse 73   Temp (!) 97.5 F (36.4 C) (Axillary)   Resp (!) 22   Ht 6' 4" (1.93 m)   Wt 114.9 kg   SpO2 92%   BMI 30.83 kg/m  Vital signs in last 24 hours: Temp:  [96.6 F (35.9 C)-98.1 F (36.7 C)] 97.5 F (36.4 C) (02/20 1235) Pulse Rate:  [60-91] 73 (02/20 1400) Resp:  [12-29] 22 (02/20 1400) BP: (134-171)/(52-89) 138/64 (02/20 1400) SpO2:  [85 %-94 %] 92 % (02/20 1522) FiO2 (%):  [40 %-50 %] 50 % (02/20 1522) Weight:  [114.9 kg] 114.9 kg (02/20 1227)   Exam performed 15 mins off Propofol  GENERAL: critically ill appearing lying in bed. unresponsive. HEENT: NCAT CV: RRR on tele.  Ext: warm  Cranial Nerves:  II: PERRL. No blink to threat.  III, IV, VI: Eyes are midline. Absent doll's eyes. No nystagmus. V, VII: no reaction to brow pressure. + corneal reflexes OU.    VIII: no reaction to name calling, chest rub, or loud clap.  IX, X: no cough with suctioning. XI: head midline. XII: intubated. Motor: No response to noxious stimuli x 4 extremitis and sternocleidals.  Tone is normal. Bulk is normal.  Sensation- no withdrawal or intentional movements with noxious stimuli Coordination/cerebellum: No fine tremors noted today.   DTRs: can not illicit Gait- bedrest  Medications  Current Facility-Administered Medications:  .  0.9 %  sodium chloride infusion, , Intravenous, Continuous, WiGreer PickerelMD, Last Rate: 75 mL/hr at 01/10/2021 1317, New Bag at 01/04/2021 1317 .  acetaminophen (TYLENOL) suppository 650 mg, 650 mg, Rectal, Q6H PRN,  WiGreer PickerelMD .  chlorhexidine gluconate (MEDLINE KIT) (PERIDEX) 0.12 % solution 15 mL, 15 mL, Mouth Rinse, BID, WiGreer PickerelMD, 15 mL at 12/28/2020 0740 .  Chlorhexidine Gluconate Cloth 2 % PADS 6 each, 6 each, Topical, Daily, WiGreer PickerelMD, 6 each at 01/14/2021 0248 .  enoxaparin (LOVENOX) injection 30 mg, 30 mg, Subcutaneous, Q24H, WiGreer PickerelMD, 30 mg at 01/08/21 2101 .  fentaNYL (SUBLIMAZE) bolus via infusion 25 mcg, 25 mcg, Intravenous, Q15 min PRN, WiGreer PickerelMD, 25 mcg at 01/12/2021 0200 .  fentaNYL (SUBLIMAZE) injection 50-200 mcg, 50-200 mcg, Intravenous, Q30 min PRN, WiGreer PickerelMD, 50 mcg at 12/21/2020 0259 .  fentaNYL 250017min NS 250m24m0mc17m) infusion-PREMIX, 25-200 mcg/hr, Intravenous, Continuous, WilsoGreer Pickerel Last Rate: 2.5 mL/hr at 12/24/2020 1049, 25 mcg/hr at 01/14/2021 1049 .  hydrALAZINE (APRESOLINE) injection 10 mg, 10 mg, Intravenous, Q6H PRN, WilsoGreer Pickerel 10 mg at 01/08/21 2329 .  insulin aspart (novoLOG) injection 0-15 Units, 0-15 Units, Subcutaneous, Q4H, WilsoGreer Pickerel 5 Units at 01/10/2021 1133 .  ipratropium-albuterol (DUONEB) 0.5-2.5 (3) MG/3ML nebulizer solution 3 mL, 3 mL, Nebulization, Q6H, WilsoGreer Pickerel 3 mL at 12/22/2020 1520 .  labetalol (NORMODYNE) injection 10 mg, 10 mg, Intravenous, Q4H PRN, WilsoGreer Pickerel 10 mg at 01/10/2021 1057 .  levETIRAcetam (KEPPRA) 750 mg in sodium chloride 0.9 % 100 mL IVPB, 750 mg, Intravenous, Q12H, WilsoGreer Pickerel Stopped  at 12/22/2020 0754 .  lidocaine (XYLOCAINE) 2 % jelly 1 application, 1 application, Other, Once, Greer Pickerel, MD .  MEDLINE mouth rinse, 15 mL, Mouth Rinse, 10 times per day, Greer Pickerel, MD, 15 mL at 01/14/2021 1319 .  methylPREDNISolone sodium succinate (SOLU-MEDROL) 125 mg/2 mL injection 60 mg, 60 mg, Intravenous, Q12H, Greer Pickerel, MD, 60 mg at 12/28/2020 1056 .  morphine 2 MG/ML injection 2 mg, 2 mg, Intravenous, Q4H PRN, Greer Pickerel, MD .  ondansetron [facility]) injection 4 mg, 4 mg,  Intravenous, Q4H PRN, Greer Pickerel, MD, 4 mg at 01/04/21 1901 .  [START ON 01/10/2021] pantoprazole (PROTONIX) injection 40 mg, 40 mg, Intravenous, Q24H, Greer Pickerel, MD .  piperacillin-tazobactam (ZOSYN) IVPB 3.375 g, 3.375 g, Intravenous, Q8H, Greer Pickerel, MD, Last Rate: 12.5 mL/hr at 01/17/2021 1318, 3.375 g at 12/22/2020 1318 .  propofol (DIPRIVAN) 1000 MG/100ML infusion, 0-50 mcg/kg/min, Intravenous, Continuous, Greer Pickerel, MD, Last Rate: 27.5 mL/hr at 12/24/2020 1316, 40 mcg/kg/min at 12/31/2020 1316 .  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  MRI Brain pending  Assessment: 80 yo male who presented with weakness and diarrhea. Had been treated conservative for COVID at home and has been treated more aggressively here. He continued to have respiratory difficulties and was intubated. He has corneal reflexes but otherwise, it is difficult to illicit any type of reaction to exam. No tremors noted today off Propofol. No witnessed tremors since load of Keppra and maintenance dose. Today, we were evaluating to see if patient still needed transfer to [facility]. Believe that he does need LTM EEG. Needs MRI brain without contrast when able.   Plan: 1. Continue Keppra at current dose.  2. Transfer to [facility].  3. MRI brain without contrast.  4. Report any seizure like activity to neurology.  5. We will follow along.   Pt seen by Clance Boll, MSN, APN-BC/Nurse Practitioner/Neuro and later by MD. Note and plan to be edited as needed by MD.  Pager: [phone]

## 2021-01-09 NOTE — Progress Notes (Signed)
[place] Progress Note Patient Name: Thomas Chung DOB: 1941-03-11 MRN: [id number]   Date of Service  12/23/2020  HPI/Events of Note  Lactic Acid = 2.9 --> 3.0 --> 3.6  eICU Interventions  Plan: 1. Bolus with 0.9 NaCl 1 liter IV oer 1 hour now.  2. Nursing to inform general surgery of rising lactic acid.      Intervention Category Major Interventions: Acid-Base disturbance - evaluation and management  [name] 12/23/2020, 3:50 AM

## 2021-01-09 NOTE — Progress Notes (Signed)
Edema noted RUE distal to PICC insertion site prior to PICC insertion.  Strong radial pulses noted BUE, color equal. Lauren RN notified PICC ready to use and to remove all PIV's that are no longer needed.

## 2021-01-09 NOTE — Brief Op Note (Addendum)
01/14/2021  10:55 AM  PATIENT:  Thomas Chung  80 y.o. male  PRE-OPERATIVE DIAGNOSIS: probable bowel ischemia, Covid pneumonia  POST-OPERATIVE DIAGNOSIS:  Necrotic and  Ischemia small bowel, covid pneumonia  PROCEDURE:  Procedure(s): LAPAROSCOPY DIAGNOSTIC, exploratory laparotomy small bowel resection times two, placement abthera wound vac (N/A)  SURGEON:  Surgeon(s) and Role:    Greer Pickerel, MD - Primary  PHYSICIAN ASSISTANT:   ASSISTANTS: Sherlean Foot RNFA  ANESTHESIA:   general  EBL:  minimal   BLOOD ADMINISTERED:none  DRAINS: Nasogastric Tube and Urinary Catheter (Foley)   LOCAL MEDICATIONS USED:  NONE  SPECIMEN:  Source of Specimen:  small bowel x 2   DISPOSITION OF SPECIMEN:  PATHOLOGY  COUNTS:  YES  TOURNIQUET:  * No tourniquets in log *  DICTATION: .Other Dictation: Dictation Number [id number]  PLAN OF CARE: already inpt  PATIENT DISPOSITION:  ICU - intubated and hemodynamically stable.   Delay start of Pharmacological VTE agent (>24hrs) due to surgical blood loss or risk of bleeding: no

## 2021-01-09 NOTE — Progress Notes (Signed)
Acute Care Surgery Service Progress Note:    Chief Complaint/Subjective: Pt stable overnight but lactate up and wbc up  Objective: Vital signs in last 24 hours: Temp:  [96.6 F (35.9 C)-98.3 F (36.8 C)] 96.8 F (36 C) (02/20 0300) Pulse Rate:  [60-94] 67 (02/20 0700) Resp:  [10-29] 28 (02/20 0700) BP: (134-190)/(52-123) 158/72 (02/20 0700) SpO2:  [85 %-97 %] 92 % (02/20 0700) FiO2 (%):  [40 %-50 %] 50 % (02/20 0318) Last BM Date: 01/03/21  Intake/Output from previous day: 02/19 0701 - 02/20 0700 In: 4386.6 [I.V.:1389.6; NG/GT:60; IV Piggyback:2937.1] Out: 1410 [Urine:1300; Emesis/NG output:110] Intake/Output this shift: Total I/O In: 461.3 [I.V.:342; IV Piggyback:119.3] Out: -   Lungs:some coarse BS  Cardiovascular: reg  Abd: soft, grimaces  Extremities: no edema, +SCDs; blue toes b/l  Neuro: intubated/sedated  Lab Results: CBC  Recent Labs    01/08/21 0242 12/22/2020 0535  WBC 33.8* 44.3*  HGB 12.5* 12.7*  HCT 39.2 40.1  PLT 225 265   BMET Recent Labs    01/08/21 1134 12/31/2020 0315  NA 145 144  K 4.6 4.2  CL 110 110  CO2 21* 20*  GLUCOSE 314* 255*  BUN 98* 105*  CREATININE 3.16* 2.98*  CALCIUM 7.8* 7.6*   LFT Hepatic Function Latest Ref Rng & Units 12/27/2020 01/06/2021 01/05/2021  Total Protein 6.5 - 8.1 g/dL 5.4(L) 6.0(L) 6.2(L)  Albumin 3.5 - 5.0 g/dL 2.7(L) 3.1(L) 3.3(L)  AST 15 - 41 U/L 28 78(H) 78(H)  ALT 0 - 44 U/L 43 73(H) 62(H)  Alk Phosphatase 38 - 126 U/L 138(H) 164(H) 118  Total Bilirubin 0.3 - 1.2 mg/dL 1.1 1.6(H) 1.0  Bilirubin, Direct 0.0 - 0.3 mg/dL - - -   PT/INR No results for input(s): LABPROT, INR in the last 72 hours. ABG Recent Labs    01/06/21 1110 01/07/21 0520  PHART 7.358 7.399  HCO3 23.5 21.3    Studies/Results:  Anti-infectives: Anti-infectives (From admission, onward)   Start     Dose/Rate Route Frequency Ordered Stop   01/13/2021 0800  vancomycin (VANCOREADY) IVPB 1500 mg/300 mL        1,500 mg 150  mL/hr over 120 Minutes Intravenous Every 48 hours 12/21/2020 0702     01/07/21 2200  piperacillin-tazobactam (ZOSYN) IVPB 3.375 g  Status:  Discontinued       Note to Pharmacy: Pharmacy may modify dose for renal dysfunction. Pharmacy to manage and monitor antibiotic administration.   3.375 g 100 mL/hr over 30 Minutes Intravenous Every 8 hours 01/07/21 2014 01/07/21 2017   01/07/21 2200  piperacillin-tazobactam (ZOSYN) IVPB 3.375 g        3.375 g 12.5 mL/hr over 240 Minutes Intravenous Every 8 hours 01/07/21 2018     01/07/21 1030  vancomycin (VANCOREADY) IVPB 2000 mg/400 mL        2,000 mg 200 mL/hr over 120 Minutes Intravenous  Once 01/07/21 0929 01/07/21 1327   01/07/21 1000  ceFEPIme (MAXIPIME) 2 g in sodium chloride 0.9 % 100 mL IVPB  Status:  Discontinued        2 g 200 mL/hr over 30 Minutes Intravenous Every 12 hours 01/07/21 0928 01/07/21 2010   01/07/21 0930  vancomycin variable dose per unstable renal function (pharmacist dosing)         Does not apply See admin instructions 01/07/21 0930     01/02/21 1000  remdesivir 100 mg in sodium chloride 0.9 % 100 mL IVPB       "Followed by"  Linked Group Details   100 mg 200 mL/hr over 30 Minutes Intravenous Daily 01/17/2021 1845 01/05/21 0921   01/07/2021 2000  remdesivir 200 mg in sodium chloride 0.9% 250 mL IVPB       "Followed by" Linked Group Details   200 mg 580 mL/hr over 30 Minutes Intravenous Once 12/28/2020 1845 12/25/2020 2017      Medications: Scheduled Meds: . chlorhexidine gluconate (MEDLINE KIT)  15 mL Mouth Rinse BID  . Chlorhexidine Gluconate Cloth  6 each Topical Daily  . docusate  100 mg Per Tube BID  . enoxaparin (LOVENOX) injection  30 mg Subcutaneous Q24H  . insulin aspart  0-15 Units Subcutaneous Q4H  . ipratropium-albuterol  3 mL Nebulization Q6H  . lidocaine  1 application Other Once  . mouth rinse  15 mL Mouth Rinse 10 times per day  . methylPREDNISolone (SOLU-MEDROL) injection  60 mg Intravenous Q12H  .  ondansetron (ZOFRAN) IV  4 mg Intravenous Once  . pantoprazole (PROTONIX) IV  40 mg Intravenous Q12H  . polyethylene glycol  17 g Per Tube Daily  . vancomycin variable dose per unstable renal function (pharmacist dosing)   Does not apply See admin instructions   Continuous Infusions: . sodium chloride Stopped (01/08/2021 0700)  . fentaNYL infusion INTRAVENOUS 25 mcg/hr (01/10/2021 0755)  . levETIRAcetam Stopped (12/22/2020 0754)  . piperacillin-tazobactam (ZOSYN)  IV 12.5 mL/hr at 12/26/2020 0755  . propofol (DIPRIVAN) infusion 40 mcg/kg/min (01/05/2021 0755)  . vancomycin     PRN Meds:.acetaminophen (TYLENOL) oral liquid 160 mg/5 mL, alum & mag hydroxide-simeth, fentaNYL, fentaNYL (SUBLIMAZE) injection, hydrALAZINE, labetalol, morphine injection, ondansetron (ZOFRAN) IV  Assessment/Plan: Patient Active Problem List   Diagnosis Date Noted  . Acute respiratory disease due to COVID-19 virus 01/02/2021  . Elevated serum creatinine 01/02/2021  . Pneumonia due to COVID-19 virus 01/14/2021  . COVID-19 12/27/2020  . Gout 08/05/2020  . Need for hepatitis C screening test 08/05/2020  . Onychomycosis of multiple toenails with type 2 diabetes mellitus ([place]) 08/06/2019  . 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 B12 deficiency 10/31/2018  . Thrombocytosis 10/29/2018  . GERD without esophagitis 01/22/2018  . Asymptomatic gallstones 09/09/2015  . Routine general medical examination at a health care facility 02/10/2015  . Prostate nodule with urinary obstruction 07/17/2011  . Type II diabetes mellitus with manifestations ([place]) 08/05/2009  . Diabetic neuropathy, painful ([place]) 07/06/2009  . Hyperlipidemia with target LDL less than 100 04/22/2009  . Essential hypertension, benign 04/22/2009  . Erectile dysfunction associated with type 2 diabetes mellitus ([place]) 04/21/2009   COVID pneumonia withVDRF with likely HCAP as well -intubated yesterday  -solumedrol/remdexivir/tocilizumab - started on  vanc/cefepime - WBC 10.9>26.1>31.8>36.8>33>44 Worsening renal failure-2.9 from Cr 3 from 2.63 from 2.51   Peripheral vascular compromise- hypoperfusion noted  Pancreaticinsufficiency- On Creon at home, currently on hold Type 2 diabetes Hypertension Hyperlipidemia Gout Hx B12 deficiency 20 pack yr hx of tobacco use, none since age 80  His lactate increased slightly overnight and his white count increased to 44,000 this morning.  Based on prior conversations with CCM we felt that if he had increasing signs of infection that that would be the indication to consider laparoscopy/laparotomy to evaluate for distal small bowel ischemia.  I do think it is potentially reasonable to offer that to the patient because his Covid pneumonia is "stable".  He is not on significant vent settings.  He is not requiring prone ventilation.  However he is still a very high operative and perioperative risk  I contacted the patient's wife and had probably a 30 to 40-minute conversation with her over the phone twice.  I also contacted the daughter as well and then we had a joint conference call as well describing the patient's current clinical condition his CT scan from Friday evening.  We discussed continued observation but we did discuss that if he truly does have worsening intestinal ischemia and will ultimately lead to probable perforation, sepsis and ultimately death.  Other option would be operative exploration with potential bowel resection.  We did discuss that we may have to leave him in discontinuity if there is a question about the surrounding intestinal viability.  We did discuss the potential of ostomy.  We also discussed the potential of anastomosis with increased risk of leak.  It would be hard to know whether or not he would get an anastomosis.  He has been on stress dose steroids which increases the risk of leak.  We also discussed the possibility of issues with an ileostomy.  We also discussed other  potential complications such as bleeding, infection, injury to surrounding structures, wound infection, incisional hernia, blood clot, worsening kidney function, perioperative cardiac and pulmonary events, worsening lung function, intra-abdominal abscess, fistula formation, and death (up to 50%).  We discussed that should he survive this hospitalization he would most certainly go from the hospital to skilled nursing facility.  We discussed pros and cons of surgery.  Discussed alternative options such as continue current care and perhaps switching to comfort measures if he were to clinically decline.  Discussed the nsqip risk calculation score.   After much discussion the wife has agreed to proceed with surgery.  All their questions were asked and answered. telephone consent was performed with the bedside nurse and Dr. Loanne Drilling as witnesses  Total time this morning 1 hour. Disposition: 2 OR soon for diagnostic laparoscopy possible ex lap possible bowel resection  LOS: 8 days    Leighton Ruff. Redmond Pulling, MD, FACS General, Bariatric, & Minimally Invasive Surgery [phone] [facility]

## 2021-01-09 NOTE — Progress Notes (Signed)
Updated wife and daughter via phone  Leighton Ruff. Redmond Pulling, MD, FACS General, Bariatric, & Minimally Invasive Surgery [facility], [place]

## 2021-01-09 NOTE — Op Note (Signed)
NAME: Thomas Chung, [facility] MEDICAL RECORD JH:[id number] ACCOUNT [id number] DATE OF BIRTH:November 21, 1940 FACILITY: [facility] LOCATION: [facility] PHYSICIAN:Mirna Sutcliffe Ronnie Derby, MD  OPERATIVE REPORT  DATE OF PROCEDURE:  01/05/2021  PREOPERATIVE DIAGNOSES: 1.  Probable small bowel ischemia. 2.  COVID pneumonia with acute respiratory distress syndrome.  POSTOPERATIVE DIAGNOSES: 1.  Necrotic & ischemic small bowel ischemia. 2.  COVID pneumonia with acute respiratory distress syndrome.  PROCEDURES: 1.  Diagnostic laparoscopy. 2.  Exploratory laparotomy with small bowel resection x2 without anastomosis. 3.  Placement of ABThera wound VAC.  SURGEON:  Greer Pickerel, M.D.  ASSISTANT:  Alphia Kava, RNFA  ANESTHESIA:  General.  ESTIMATED BLOOD LOSS:  Minimal.  SPECIMENS:  Segment of ileum x2.  FINDINGS:  The patient had a section in his distal small bowel in the ileum that was frankly necrotic.  There was some demarcation on both ends of the area.  This distal demarcation was approximately about 10 inches from the ileocecal valve.  There was  no obvious perforation.  There was turbid, murky purulent fluid in the abdomen, but no succuss.  There were no enteric contents in the abdomen.  The area of frank necrosis was probably about 8 inches long.  About 20 inches proximal to that section there  was a focal area of early necrosis to a section of small bowel, which was also resected.  The bowel was left in discontinuity.  I was concerned about ongoing ischemia as well as viability of the resected ends even though there appeared to be adequate  blood flow at the time of surgery.  I felt he would be best served by a second look in 48 hours.  INDICATIONS:  The patient is a 80 year old gentleman who was admitted on 01/06/2021 with COVID.  He ended up decompensating from a pulmonary standpoint on the 17th and developed respiratory failure requiring increased oxygen requirement was ventilated.   He was started on  steroids and antibiotics for his COVID pneumonia.  He also developed acute kidney injury, but was still having urine output.  We were consulted around the 16th for an ileus and abdominal distention, probably felt due to his underlying  pneumonia.  He had a CT scan on Friday evening and there was an area distal small bowel that had some pneumatosis, but no frank evidence of perforation.  His lactate had bumped a little bit, but was stable and his white count was stable.  The decision  was made to just monitor it to see if it would worsen, so he was felt to be a moderate to high operative risk.  I had a long conversation with the critical care medicine physician yesterday and we both agreed that change in antibiotics and to do serial  lactates and if his lactate was to increase further or if his white count worsened that would be indications to consider laparotomy.  They felt that his COVID pneumonia, was stable, although he was on the ventilator.  He was not requiring significant  ventilator support such as high PEEP for high FiO2, but nor prone ventilation.  Even though his BUN and creatinine were increasing he was having urine output.  This morning, his white count went to 44,000 and his lactate went up to 3.6 from 3.2, so  therefore, I contacted the family had a very extensive conversation with the family regarding the current clinical picture, the concern for intestinal ischemia and the options, which are separately documented in the medical record.  We went over his  NSQIP,  perioperative morbidity and mortality.  The patient's family requested operative intervention.  DESCRIPTION OF PROCEDURE:  He was taken urgently to OR 5 under COVID precautions.  He was placed on the operating room table.  His endotracheal tube was connected to the anesthesia circuit.  His abdomen was prepped and draped in the usual standard  surgical fashion with ChloraPrep.  He was on broad-spectrum scheduled IV antibiotics.  A  surgical timeout was performed.  All staff was wearing correct PPE equipment.  Decided to gain access to his abdomen using Optiview technique to just one confirmed there was intestinal compromise before subjecting him to a midline laparotomy.  A small incision was made in the left upper  quadrant just below the subcostal margin.  Then, using a 0 degree 5 mm laparoscope through a 5 mm trocar, I advanced it through all layers of the abdominal wall and carefully entered the abdomen.  Pneumoperitoneum was established.  No change in  patient's vital signs.  The patient was not on any vasopressors.  He did have some borderline oxygenation issues with an O2 sat in the high 80s.  The laparoscope was advanced.  There was no evidence of injury to surrounding structures.  I was able to  quickly visualize an area of frank necrosis in the small bowel.  I did not see the need to continue laparoscopically at this point.  A midline incision was made with a 10-blade deep dermis and subcutaneous tissue was divided with electrocautery and the  fascia was entered.  A Balfour retractor was placed.  There was turbid, murky purulent fluid in the abdomen and the peritoneum was injected consistent with purulent peritonitis; however, there was no frank perforation.  There were no enteric contents in  the abdomen.  There was probably about an 8-inch to 10-inch segment of frankly necrotic small bowel in the distal ileum.  There was probably 10 inches of viable distal ileum to the ileocecal valve.  There was a good area of demarcation.  However, the  distal ileum was not ischemic, but it also was not overtly looked greatly perfused, but again was not ischemic and therefore, I did not feel it needed to be resected today.  I ran the small bowel proximally and about 20 inches proximal to that long area  of necrotic bowel.  There was about a 2-inch to 3-inch segment of frankly ischemic small bowel as well.  I ran it up to the ligament of  Treitz and there were no other areas of ischemia.  The ascending colon, descending and sigmoid colon and upper rectum  appeared viable.  No signs of ischemia there.  The long segment of small bowel that was about 8-10 inches that was necrotic was resected with a GIA-75 stapler with a blue load at the distal limb and again at the proximal end with another load.  The  mesentery was then taken down in sequential firing with the LigaSure.  There was some bleeding from the mesentery from the vessel that had good blood flow to it, so did require a 2-0 silk ligature x2.  The specimen was passed off the field.  The area of  ischemic small bowel that was proximal to that was resected in a similar fashion with two fires of the GIA-75 stapler and the mesentery taken down with LigaSure.  I was concerned about the potential ongoing microvascular ischemia to the surrounding small  bowel, so I did not feel reconnecting him was prudent today, so he  was left in discontinuity.  We irrigated the abdomen with 4 liters of saline.  An abdominal ABThera wound VAC was placed with adequate seal.  All needle, instrument counts were correct  x2.  There were no immediate complications.  The patient did not need vasopressors during surgery.  He was taken back to the ICU intubated and in hemodynamically stable condition.  HN/NUANCE  D:01/14/2021 T:12/27/2020 JOB:[id number]

## 2021-01-09 NOTE — Interval H&P Note (Signed)
History and Physical Interval Note:  12/26/2020 7:49 AM  Thomas Chung  has presented today for surgery, with the diagnosis of questionable bowel ischemia.  The various methods of treatment have been discussed with the patient and family. After consideration of risks, benefits and other options for treatment, the patient has consented to  Procedure(s): LAPAROSCOPY DIAGNOSTIC, POSSIBLE LAPAROTOMY, POSSIBLE BOWEL RESECTION (N/A) as a surgical intervention.  The patient's history has been reviewed, patient examined, no change in status, stable for surgery.  I have reviewed the patient's chart and labs.  Questions were answered to the patient's satisfaction.       Greer Pickerel

## 2021-01-09 NOTE — Progress Notes (Signed)
NSQIP risk calculator    . Leighton Ruff. Redmond Pulling, MD, FACS General, Bariatric, & Minimally Invasive Surgery [facility], [place]

## 2021-01-09 NOTE — Progress Notes (Signed)
Report given to 2 [facility] RN and [facility] for transfer to 2H-21C.

## 2021-01-09 NOTE — Progress Notes (Signed)
PHARMACY - TOTAL PARENTERAL NUTRITION CONSULT NOTE   Indication: Prolonged ileus  Patient Measurements: Height: 6\' 4"  (193 cm) Weight: 113.4 kg (250 lb) IBW/kg (Calculated) : 86.8 TPN AdjBW (KG): 93.7 Body mass index is 30.43 kg/m. Usual Weight: 120 kg on 11/04/20  Assessment:  80 yo male with hx DM2, neuropathy, pancreatic insufficiency, hypertension, GERD who required intubation 2/17 for COVID pneumonia.  He developed lactic acidosis concerning for peritonitis, CT showed possible enteritis/ischemia.  On 2/20, developed worsening lactic acidosis concerning for bowel ischemia and is now s/p small bowel resection times two, placement abthera wound vac.  Pharmacy consulted for TPN.  Glucose / Insulin: Hx DM2, A1c 6.7 on 11/04/20. CBG range 189-223 (steroids) - 31 units moderate scale SSI q4h used in past 24hr Electrolytes: Na/K WNL, CorrCa slightly low at 8.64.  Renal: AKI, SCr decreased 2.98, BUN rising 105. UOP 1328ml in past 24hr, none recorded today. LFTs / TGs: AlkPhos elevated 138, AST/ALT WNL. TG in AM Prealbumin / albumin: PAB in AM; Alb 2.7 Intake / Output: net +708 MIVF: NS at 75 ml/hr GI Imaging: 2/18 CT abd: segmental enteritis in right lower quadrant concerning for early ischemia. No bowel obstruction. Surgeries / Procedures:  2/20: ex-lap with small bowel resection x 2  Central access: PICC ordered TPN start date: plan 2/21  Nutritional Goals (RD recommendations 2/18): KCal: 5597-4163 , >/= 150g Protein: , Fluid: >/= 3 L/day Goal TPN rate is 100 mL/hr provides 144 g of protein/day -  2155 kcals per day on MWF with SMOF lipids 25g/L - 1555 kcal per day on TTSS without lipids due to national backorder  Current Nutrition:  NPO  Plan:  - Start TPN tomorrow since unsure if PICC will be placed today, MD notified - Check CMET, Mg, Phos in AM - Follow up any new RD recommendations - Follow up propofol dose/rate to calculate caloric contribution  Peggyann Juba,  PharmD, [place]: [phone] 12/27/2020,12:31 PM

## 2021-01-10 ENCOUNTER — Encounter (HOSPITAL_COMMUNITY): Payer: Self-pay | Admitting: General Surgery

## 2021-01-10 ENCOUNTER — Inpatient Hospital Stay (HOSPITAL_COMMUNITY): Payer: Medicare HMO

## 2021-01-10 DIAGNOSIS — G928 Other toxic encephalopathy: Secondary | ICD-10-CM | POA: Diagnosis not present

## 2021-01-10 DIAGNOSIS — J069 Acute upper respiratory infection, unspecified: Secondary | ICD-10-CM | POA: Diagnosis not present

## 2021-01-10 DIAGNOSIS — R569 Unspecified convulsions: Secondary | ICD-10-CM | POA: Diagnosis not present

## 2021-01-10 DIAGNOSIS — U071 COVID-19: Secondary | ICD-10-CM | POA: Diagnosis not present

## 2021-01-10 LAB — BASIC METABOLIC PANEL
Anion gap: 10 (ref 5–15)
Anion gap: 10 (ref 5–15)
BUN: 113 mg/dL — ABNORMAL HIGH (ref 8–23)
BUN: 128 mg/dL — ABNORMAL HIGH (ref 8–23)
CO2: 19 mmol/L — ABNORMAL LOW (ref 22–32)
CO2: 22 mmol/L (ref 22–32)
Calcium: >15 mg/dL (ref 8.9–10.3)
Calcium: 7.5 mg/dL — ABNORMAL LOW (ref 8.9–10.3)
Chloride: 113 mmol/L — ABNORMAL HIGH (ref 98–111)
Chloride: 115 mmol/L — ABNORMAL HIGH (ref 98–111)
Creatinine, Ser: 2.36 mg/dL — ABNORMAL HIGH (ref 0.61–1.24)
Creatinine, Ser: 2.55 mg/dL — ABNORMAL HIGH (ref 0.61–1.24)
GFR, Estimated: 27 mL/min — ABNORMAL LOW (ref >60)
GFR, Estimated: 25 mL/min — ABNORMAL LOW (ref >60)
Glucose, Bld: 385 mg/dL — ABNORMAL HIGH (ref 70–99)
Glucose, Bld: 253 mg/dL — ABNORMAL HIGH (ref 70–99)
Potassium: 4.9 mmol/L (ref 3.5–5.1)
Potassium: 5.6 mmol/L — ABNORMAL HIGH (ref 3.5–5.1)
Sodium: 142 mmol/L (ref 135–145)
Sodium: 147 mmol/L — ABNORMAL HIGH (ref 135–145)

## 2021-01-10 LAB — POCT I-STAT 7, (LYTES, BLD GAS, ICA,H+H)
Acid-base deficit: 5 mmol/L — ABNORMAL HIGH (ref 0.0–2.0)
Acid-base deficit: 4 mmol/L — ABNORMAL HIGH (ref 0.0–2.0)
Bicarbonate: 21.4 mmol/L (ref 20.0–28.0)
Bicarbonate: 21.3 mmol/L (ref 20.0–28.0)
Calcium, Ion: 1.04 mmol/L — ABNORMAL LOW (ref 1.15–1.40)
Calcium, Ion: 1.02 mmol/L — ABNORMAL LOW (ref 1.15–1.40)
HCT: 40 % (ref 39.0–52.0)
HCT: 38 % — ABNORMAL LOW (ref 39.0–52.0)
Hemoglobin: 13.6 g/dL (ref 13.0–17.0)
Hemoglobin: 12.9 g/dL — ABNORMAL LOW (ref 13.0–17.0)
O2 Saturation: 97 %
O2 Saturation: 95 %
Patient temperature: 97.7
Potassium: 5.2 mmol/L — ABNORMAL HIGH (ref 3.5–5.1)
Potassium: 5.4 mmol/L — ABNORMAL HIGH (ref 3.5–5.1)
Sodium: 146 mmol/L — ABNORMAL HIGH (ref 135–145)
Sodium: 145 mmol/L (ref 135–145)
TCO2: 23 mmol/L (ref 22–32)
TCO2: 22 mmol/L (ref 22–32)
pCO2 arterial: 41.6 mmHg (ref 32.0–48.0)
pCO2 arterial: 39.3 mmHg (ref 32.0–48.0)
pH, Arterial: 7.316 — ABNORMAL LOW (ref 7.350–7.450)
pH, Arterial: 7.341 — ABNORMAL LOW (ref 7.350–7.450)
pO2, Arterial: 93 mmHg (ref 83.0–108.0)
pO2, Arterial: 81 mmHg — ABNORMAL LOW (ref 83.0–108.0)

## 2021-01-10 LAB — GLUCOSE, CAPILLARY
Glucose-Capillary: 284 mg/dL — ABNORMAL HIGH (ref 70–99)
Glucose-Capillary: 226 mg/dL — ABNORMAL HIGH (ref 70–99)
Glucose-Capillary: 239 mg/dL — ABNORMAL HIGH (ref 70–99)
Glucose-Capillary: 239 mg/dL — ABNORMAL HIGH (ref 70–99)
Glucose-Capillary: 225 mg/dL — ABNORMAL HIGH (ref 70–99)

## 2021-01-10 LAB — DIFFERENTIAL
Abs Immature Granulocytes: 0 10*3/uL (ref 0.00–0.07)
Basophils Absolute: 0 10*3/uL (ref 0.0–0.1)
Basophils Relative: 0 %
Eosinophils Absolute: 0 10*3/uL (ref 0.0–0.5)
Eosinophils Relative: 0 %
Lymphocytes Relative: 2 %
Lymphs Abs: 0.8 10*3/uL (ref 0.7–4.0)
Monocytes Absolute: 0.8 10*3/uL (ref 0.1–1.0)
Monocytes Relative: 2 %
Neutro Abs: 39.9 10*3/uL — ABNORMAL HIGH (ref 1.7–7.7)
Neutrophils Relative %: 96 %
nRBC: 2 /100 WBC — ABNORMAL HIGH

## 2021-01-10 LAB — COMPREHENSIVE METABOLIC PANEL
ALT: 40 U/L (ref 0–44)
AST: 33 U/L (ref 15–41)
Albumin: 2.4 g/dL — ABNORMAL LOW (ref 3.5–5.0)
Alkaline Phosphatase: 106 U/L (ref 38–126)
Anion gap: 11 (ref 5–15)
BUN: 121 mg/dL — ABNORMAL HIGH (ref 8–23)
CO2: 20 mmol/L — ABNORMAL LOW (ref 22–32)
Calcium: 7.3 mg/dL — ABNORMAL LOW (ref 8.9–10.3)
Chloride: 114 mmol/L — ABNORMAL HIGH (ref 98–111)
Creatinine, Ser: 2.75 mg/dL — ABNORMAL HIGH (ref 0.61–1.24)
GFR, Estimated: 23 mL/min — ABNORMAL LOW (ref >60)
Glucose, Bld: 282 mg/dL — ABNORMAL HIGH (ref 70–99)
Potassium: 5.3 mmol/L — ABNORMAL HIGH (ref 3.5–5.1)
Sodium: 145 mmol/L (ref 135–145)
Total Bilirubin: 0.9 mg/dL (ref 0.3–1.2)
Total Protein: 4.6 g/dL — ABNORMAL LOW (ref 6.5–8.1)

## 2021-01-10 LAB — CULTURE, RESPIRATORY W GRAM STAIN

## 2021-01-10 LAB — CBC
HCT: 37.7 % — ABNORMAL LOW (ref 39.0–52.0)
Hemoglobin: 12.2 g/dL — ABNORMAL LOW (ref 13.0–17.0)
MCH: 26.1 pg (ref 26.0–34.0)
MCHC: 32.4 g/dL (ref 30.0–36.0)
MCV: 80.7 fL (ref 80.0–100.0)
Platelets: 247 10*3/uL (ref 150–400)
RBC: 4.67 MIL/uL (ref 4.22–5.81)
RDW: 17.1 % — ABNORMAL HIGH (ref 11.5–15.5)
WBC: 41.6 10*3/uL — ABNORMAL HIGH (ref 4.0–10.5)
nRBC: 1.6 % — ABNORMAL HIGH (ref 0.0–0.2)

## 2021-01-10 LAB — MAGNESIUM: Magnesium: 3.6 mg/dL — ABNORMAL HIGH (ref 1.7–2.4)

## 2021-01-10 LAB — TRIGLYCERIDES: Triglycerides: 567 mg/dL — ABNORMAL HIGH (ref <150)

## 2021-01-10 LAB — HEMOGLOBIN AND HEMATOCRIT, BLOOD
HCT: 38.7 % — ABNORMAL LOW (ref 39.0–52.0)
Hemoglobin: 11.7 g/dL — ABNORMAL LOW (ref 13.0–17.0)

## 2021-01-10 LAB — PHOSPHORUS: Phosphorus: 6.5 mg/dL — ABNORMAL HIGH (ref 2.5–4.6)

## 2021-01-10 LAB — PREALBUMIN: Prealbumin: 22.1 mg/dL (ref 18–38)

## 2021-01-10 MED ORDER — MIDAZOLAM 50MG/50ML (1MG/ML) PREMIX INFUSION
0.0000 mg/h | INTRAVENOUS | Status: DC
  Administered 2021-01-10: 0.5 mg/h via INTRAVENOUS
  Filled 2021-01-10: qty 50

## 2021-01-10 MED ORDER — DEXTROSE 5 % IV SOLN: INTRAVENOUS | Status: DC

## 2021-01-10 MED ORDER — MIDAZOLAM HCL 2 MG/2ML IJ SOLN
2.0000 mg | INTRAMUSCULAR | Status: DC | PRN
  Administered 2021-01-10: 2 mg via INTRAVENOUS
  Filled 2021-01-10: qty 2

## 2021-01-10 MED ORDER — TRAVASOL 10 % IV SOLN
INTRAVENOUS | Status: AC
  Filled 2021-01-10: qty 604.8

## 2021-01-10 MED ORDER — SODIUM CHLORIDE 0.9 % IV SOLN
20.0000 ug | Freq: Once | INTRAVENOUS | Status: AC
  Administered 2021-01-10: 20 ug via INTRAVENOUS
  Filled 2021-01-10: qty 5

## 2021-01-10 MED ORDER — CALCIUM GLUCONATE-NACL 1-0.675 GM/50ML-% IV SOLN
1.0000 g | Freq: Once | INTRAVENOUS | Status: AC
  Administered 2021-01-10: 1000 mg via INTRAVENOUS
  Filled 2021-01-10: qty 50

## 2021-01-10 MED ORDER — INSULIN ASPART 100 UNIT/ML ~~LOC~~ SOLN
0.0000 [IU] | SUBCUTANEOUS | Status: DC
  Administered 2021-01-10 (×3): 7 [IU] via SUBCUTANEOUS
  Administered 2021-01-11: 11 [IU] via SUBCUTANEOUS
  Administered 2021-01-11: 7 [IU] via SUBCUTANEOUS

## 2021-01-10 MED ORDER — INSULIN DETEMIR 100 UNIT/ML ~~LOC~~ SOLN
12.0000 [IU] | Freq: Two times a day (BID) | SUBCUTANEOUS | Status: DC
  Filled 2021-01-10 (×2): qty 0.12

## 2021-01-10 MED ORDER — METHYLPREDNISOLONE SODIUM SUCC 40 MG IJ SOLR
40.0000 mg | Freq: Every day | INTRAMUSCULAR | Status: DC
  Administered 2021-01-10 – 2021-01-12 (×3): 40 mg via INTRAVENOUS
  Filled 2021-01-10 (×3): qty 1

## 2021-01-10 MED ORDER — SODIUM ZIRCONIUM CYCLOSILICATE 10 G PO PACK
10.0000 g | PACK | Freq: Once | ORAL | Status: DC
  Filled 2021-01-10: qty 1

## 2021-01-10 MED ORDER — IPRATROPIUM-ALBUTEROL 0.5-2.5 (3) MG/3ML IN SOLN: 3.0000 mL | Freq: Four times a day (QID) | RESPIRATORY_TRACT | Status: DC | PRN

## 2021-01-10 NOTE — Progress Notes (Signed)
PHARMACY - TOTAL PARENTERAL NUTRITION CONSULT NOTE   Indication: Prolonged ileus  Patient Measurements: Height: 6\' 4"  (193 cm) Weight: 110.1 kg (242 lb 11.6 oz) IBW/kg (Calculated) : 86.8 TPN AdjBW (KG): 93.7 Body mass index is 29.55 kg/m. Usual Weight: 120 kg on 11/04/20  Assessment:  80 yo male with hx DM2, neuropathy, pancreatic insufficiency, hypertension, GERD who required intubation 2/17 for COVID pneumonia.  He developed lactic acidosis concerning for peritonitis, CT showed possible enteritis/ischemia.  On 2/20, developed worsening lactic acidosis concerning for bowel ischemia and is now s/p small bowel resection times two, placement abthera wound vac.  Pharmacy consulted for TPN.  Glucose / Insulin: Hx DM2, A1c 6.7 on 11/04/20. CBG range 200s (steroids) - 30 units moderate scale SSI q4h used in past 24hr Electrolytes: K 5.3, Cl 114, Bicarb 20, Phos 6.5, Mg 3.6, CorCa low Renal: AKI, SCr 2.98>2.75, BUN rising 105>121. UOP 0.5. LFTs / TGs: AlkPhos 138>106, AST/ALT/Tbili wnl, TG 567 (propofol stopped) Prealbumin / albumin: PAB 22.1; Alb 2.7>2.4 Intake / Output: UOP 0.5 ml/kg/hr, NG 350 mL, Drain 1.5L MIVF: NS at 75 ml/hr GI Imaging: 2/18 CT abd: segmental enteritis in right lower quadrant concerning for early ischemia. No bowel obstruction. Surgeries / Procedures:  2/20: ex-lap with small bowel resection x 2 Plan for return to OR 2/22 potentially more resection of bowel, ileostomy + closure  Central access: PICC 2/20 TPN start date: plan 2/21  Nutritional Goals (RD recommendations 2/18): KCal: 1779-3903 , >/= 150g Protein: , Fluid: >/= 3 L/day Goal TPN rate is 100 mL/hr provides 151 g of protein/day -  Average 2033 Kcal with lipids only MWF (20g/L) due to national shortage  Current Nutrition:  NPO  Plan:  Initiate TPN at 40 mL/hr; Conservative titration with electrolyte issues Hold lipids for hypertriglyceridemia  Electrolytes in TPN: No K/phos/Mg with TPN start, Ca  at 5 mEq/L, low Na. ES:PQZRAQT to 1:2. Continue mod SSI q 4h, May inc to resistant SSI and consider addition of insulin to TPN based on response tomorrow Add standard MVI and trace elements to TPN BMP, Mg, Phos in AM  Bertis Ruddy, PharmD Clinical Pharmacist Please check AMION for all [place] numbers 01/10/2021 8:10 AM

## 2021-01-10 NOTE — Procedures (Signed)
Patient Name: Thomas Chung  MRN: [id number]  Epilepsy Attending: Lora Havens  Referring Physician/Provider: Dr. Amie Portland Date: 01/10/2021 Duration: 24.05 mins  Patient history: 80 year old male with episode of twitching/tremoring of the face and right shoulder.  EEG to evaluate for seizures.  Level of alertness:  lethargic  AEDs during EEG study: Keppra, Versed  Technical aspects: This EEG study was done with scalp electrodes positioned according to the 10-20 International system of electrode placement. Electrical activity was acquired at a sampling rate of 500Hz  and reviewed with a high frequency filter of 70Hz  and a low frequency filter of 1Hz . EEG data were recorded continuously and digitally stored.   Description: EEG showed continuous generalized 3 to 6 Hz theta-delta slowing. Hyperventilation and photic stimulation were not performed.     ABNORMALITY -Continuous slow, generalized  IMPRESSION: This study is suggestive of moderate to severe diffuse encephalopathy, nonspecific etiology. No seizures or epileptiform discharges were seen throughout the recording.   Chaelyn Bunyan Barbra Sarks

## 2021-01-10 NOTE — Progress Notes (Addendum)
Neurology Progress Note   S:// Seen and examined. No acute events overnight. Neurology was initially consulted for patient's twitching/tremoring episodes of the face and right shoulder which subsided after Keppra load.  On maintenance Keppra 750 twice daily with no further witnessed seizure activity. CT head with no acute abnormality. Has multiple medical issues and also small bowel obstruction.  O:// Current vital signs: BP (!) 168/83   Pulse 70   Temp 97.7 F (36.5 C) (Axillary)   Resp (!) 26   Ht $R'6\' 4"'bs$  (1.93 m)   Wt 110.1 kg   SpO2 98%   BMI 29.55 kg/m  Vital signs in last 24 hours: Temp:  [97.4 F (36.3 C)-98.2 F (36.8 C)] 97.7 F (36.5 C) (02/21 0400) Pulse Rate:  [62-91] 70 (02/21 0700) Resp:  [12-30] 26 (02/21 0700) BP: (129-177)/(60-105) 168/83 (02/21 0700) SpO2:  [92 %-98 %] 98 % (02/21 0741) FiO2 (%):  [50 %] 50 % (02/21 0741) Weight:  [110.1 kg-114.9 kg] 110.1 kg (02/21 0600) On sedation with propofol and fentanyl General: Ill-appearing man intubated, unresponsive in bed HEENT: Normocephalic, atraumatic, ET tube in place Cardiovascular: Regular rate rhythm Extremities well perfused Neurological exam Sedated intubated No response to voice Does not open eyes to voice or follow any commands Nonverbal Cranial nerves: Pupils are equal, sluggishly reactive light, no blink to threat from either side, breathing over the vent when off of sedation, no cough or gag Motor: No response to noxious stimulation in either of the 4 extremities. Sensory exam: As above  Medications  Current Facility-Administered Medications:  .  0.9 %  sodium chloride infusion, , Intravenous, Continuous, Greer Pickerel, MD, Last Rate: 75 mL/hr at 01/10/21 0700, Infusion Verify at 01/10/21 0700 .  acetaminophen (TYLENOL) suppository 650 mg, 650 mg, Rectal, Q6H PRN, Greer Pickerel, MD .  chlorhexidine gluconate (MEDLINE KIT) (PERIDEX) 0.12 % solution 15 mL, 15 mL, Mouth Rinse, BID, Greer Pickerel,  MD, 15 mL at 01/08/2021 0740 .  Chlorhexidine Gluconate Cloth 2 % PADS 6 each, 6 each, Topical, Daily, Greer Pickerel, MD, 6 each at 01/07/2021 0248 .  dextrose 5 % solution, , Intravenous, Continuous, Candee Furbish, MD .  enoxaparin (LOVENOX) injection 30 mg, 30 mg, Subcutaneous, Q24H, Greer Pickerel, MD, 30 mg at 01/07/2021 2037 .  fentaNYL (SUBLIMAZE) bolus via infusion 25 mcg, 25 mcg, Intravenous, Q15 min PRN, Greer Pickerel, MD, 25 mcg at 12/27/2020 0200 .  fentaNYL (SUBLIMAZE) injection 50-200 mcg, 50-200 mcg, Intravenous, Q30 min PRN, Greer Pickerel, MD, 50 mcg at 01/05/2021 0259 .  fentaNYL 2534mcg in NS 280mL (26mcg/ml) infusion-PREMIX, 25-200 mcg/hr, Intravenous, Continuous, Greer Pickerel, MD, Last Rate: 20 mL/hr at 01/10/21 0713, 200 mcg/hr at 01/10/21 0713 .  hydrALAZINE (APRESOLINE) injection 10 mg, 10 mg, Intravenous, Q6H PRN, Greer Pickerel, MD, 10 mg at 01/10/21 [phone] .  insulin aspart (novoLOG) injection 0-15 Units, 0-15 Units, Subcutaneous, Q4H, Greer Pickerel, MD, 5 Units at 01/10/21 [phone] .  ipratropium-albuterol (DUONEB) 0.5-2.5 (3) MG/3ML nebulizer solution 3 mL, 3 mL, Nebulization, Q6H PRN, Margaretha Seeds, MD .  labetalol (NORMODYNE) injection 10 mg, 10 mg, Intravenous, Q4H PRN, Greer Pickerel, MD, 10 mg at 01/07/2021 1057 .  levETIRAcetam (KEPPRA) 750 mg in sodium chloride 0.9 % 100 mL IVPB, 750 mg, Intravenous, Q12H, Greer Pickerel, MD, Stopped at 01/08/2021 2034 .  lidocaine (XYLOCAINE) 2 % jelly 1 application, 1 application, Other, Once, Greer Pickerel, MD .  MEDLINE mouth rinse, 15 mL, Mouth Rinse, 10 times per day, Greer Pickerel, MD, 15  mL at 01/10/21 0549 .  methylPREDNISolone sodium succinate (SOLU-MEDROL) 125 mg/2 mL injection 60 mg, 60 mg, Intravenous, Q12H, Greer Pickerel, MD, 60 mg at 01/07/2021 2040 .  midazolam (VERSED) 50 mg/50 mL (1 mg/mL) premix infusion, 0-20 mg/hr, Intravenous, Continuous, Candee Furbish, MD .  morphine 2 MG/ML injection 2 mg, 2 mg, Intravenous, Q4H PRN, Greer Pickerel, MD .   ondansetron [facility]) injection 4 mg, 4 mg, Intravenous, Q4H PRN, Greer Pickerel, MD, 4 mg at 01/04/21 1901 .  pantoprazole (PROTONIX) injection 40 mg, 40 mg, Intravenous, Q24H, Greer Pickerel, MD .  piperacillin-tazobactam (ZOSYN) IVPB 3.375 g, 3.375 g, Intravenous, Q8H, Greer Pickerel, MD, Last Rate: 12.5 mL/hr at 01/10/21 0700, Infusion Verify at 01/10/21 0700 .  sodium chloride flush (NS) 0.9 % injection 10-40 mL, 10-40 mL, Intracatheter, Q12H, Margaretha Seeds, MD, 10 mL at 01/03/2021 2159 .  sodium chloride flush (NS) 0.9 % injection 10-40 mL, 10-40 mL, Intracatheter, PRN, Margaretha Seeds, MD, 10 mL at 12/28/2020 2158 .  sodium zirconium cyclosilicate (LOKELMA) packet 10 g, 10 g, Per Tube, Once, Anders Simmonds, MD .  vancomycin [facility]) IVPB 1500 mg/300 mL, 1,500 mg, Intravenous, Q48H, Greer Pickerel, MD, Stopped at 01/14/2021 0824 Labs CBC    Component Value Date/Time   WBC 41.6 (H) 01/10/2021 0323   RBC 4.67 01/10/2021 0323   HGB 12.2 (L) 01/10/2021 0323   HGB 14.8 12/29/2020 1123   HCT 37.7 (L) 01/10/2021 0323   HCT 44.9 12/29/2020 1123   PLT 247 01/10/2021 0323   PLT 353 12/29/2020 1123   MCV 80.7 01/10/2021 0323   MCV 78 (L) 12/29/2020 1123   MCH 26.1 01/10/2021 0323   MCHC 32.4 01/10/2021 0323   RDW 17.1 (H) 01/10/2021 0323   RDW 15.8 (H) 12/29/2020 1123   LYMPHSABS 0.8 01/10/2021 0323   MONOABS 0.8 01/10/2021 0323   EOSABS 0.0 01/10/2021 0323   BASOSABS 0.0 01/10/2021 0323    CMP     Component Value Date/Time   NA 145 01/10/2021 0323   NA 133 (L) 12/29/2020 1123   K 5.3 (H) 01/10/2021 0323   CL 114 (H) 01/10/2021 0323   CO2 20 (L) 01/10/2021 0323   GLUCOSE 282 (H) 01/10/2021 0323   BUN 121 (H) 01/10/2021 0323   BUN 36 (H) 12/29/2020 1123   CREATININE 2.75 (H) 01/10/2021 0323   CREATININE 1.20 (H) 08/05/2020 1345   CALCIUM 7.3 (L) 01/10/2021 0323   PROT 4.6 (L) 01/10/2021 0323   PROT 6.5 12/29/2020 1123   ALBUMIN 2.4 (L) 01/10/2021 0323   ALBUMIN 4.3 12/29/2020  1123   AST 33 01/10/2021 0323   ALT 40 01/10/2021 0323   ALKPHOS 106 01/10/2021 0323   BILITOT 0.9 01/10/2021 0323   BILITOT 0.5 12/29/2020 1123   GFRNONAA 23 (L) 01/10/2021 0323   GFRNONAA 58 (L) 08/05/2020 1345   GFRAA 48 (L) 12/29/2020 1123   GFRAA 67 08/05/2020 1345     Imaging I have reviewed images in epic and the results pertinent to this consultation are: CT head with no acute changes  Assessment: 80 year old man presenting with weakness and diarrhea, also positive for Covid, initially treated at home conservatively but then aggressive treatment started including intubation after he started having respiratory difficulties. Neurology was consulted because when he was started off on sedation after intubation, he had some right-sided twitching. No twitching after being loaded with Keppra and starting Keppra. His exam remains poor with only brainstem reflexes. 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 Continue Keppra 750 twice daily for now Maintain seizure precaution Attempt sedation holiday as tolerated Management of medical issues and small bowel obstruction per primary team and surgery as you are.  Plan discussed with Dr. Tamala Julian. -- Amie Portland, MD Neurologist [facility] Pager: [phone]  CRITICAL CARE ATTESTATION Performed by: Amie Portland, MD Total critical care time: 31 minutes Critical care time was exclusive of separately billable procedures and treating other patients and/or supervising APPs/Residents/Students Critical care was necessary to treat or prevent imminent or life-threatening deterioration due to toxic metabolic encephalopathy, concern for seizures. This patient is critically ill and at  significant risk for neurological worsening and/or death and care requires constant monitoring. Critical care was time spent personally by me on the following activities: development of treatment plan with patient and/or surrogate as well as nursing, discussions with consultants, evaluation of patient's response to treatment, examination of patient, obtaining history from patient or surrogate, ordering and performing treatments and interventions, ordering and review of laboratory studies, ordering and review of radiographic studies, pulse oximetry, re-evaluation of patient's condition, participation in multidisciplinary rounds and medical decision making of high complexity in the care of this patient.

## 2021-01-10 NOTE — Progress Notes (Signed)
Patient's wound vac dressing noted to be leaking and saturated patient's gown. Current vac assessed and suctioning appropriately at 125 mmHg. No kinks or obstruction noted. New canister obtained and changed upon arrival. Wound vac suction remains at 125 mmHg. General surgery notified by phone of leaking dressing. No new orders received at this time. Attempts made to reinforce dressing. Will continue to monitor.

## 2021-01-10 NOTE — Progress Notes (Signed)
Acute Care Surgery Service Progress Note:    Chief Complaint/Subjective: Sedated on vent post op.  Some bleeding from midline wound VAC  Objective: Vital signs in last 24 hours: Temp:  [97.4 F (36.3 C)-98.2 F (36.8 C)] 97.7 F (36.5 C) (02/21 0400) Pulse Rate:  [62-91] 70 (02/21 0700) Resp:  [12-30] 26 (02/21 0700) BP: (129-177)/(60-105) 168/83 (02/21 0700) SpO2:  [92 %-98 %] 98 % (02/21 0741) FiO2 (%):  [50 %] 50 % (02/21 0741) Weight:  [110.1 kg-114.9 kg] 110.1 kg (02/21 0600) Last BM Date: 01/03/21  Intake/Output from previous day: 02/20 0701 - 02/21 0700 In: 3128.4 [I.V.:2723.5; NG/GT:30; IV Piggyback:374.9] Out: 3075 [Urine:1225; Emesis/NG output:350; Drains:1500] Intake/Output this shift: Total I/O In: 4 [I.V.:4] Out: -   PE: Abd: soft, ND, VAC in place with some bleeding at left lateral inferior aspect of VAC, but suction still holding.  NGT with minimal output, 100cc currently  Lab Results: CBC  Recent Labs    01/04/2021 0535 01/10/21 0323  WBC 44.3* 41.6*  HGB 12.7* 12.2*  HCT 40.1 37.7*  PLT 265 247   BMET Recent Labs    12/28/2020 0315 01/10/21 0323  NA 144 145  K 4.2 5.3*  CL 110 114*  CO2 20* 20*  GLUCOSE 255* 282*  BUN 105* 121*  CREATININE 2.98* 2.75*  CALCIUM 7.6* 7.3*   LFT Hepatic Function Latest Ref Rng & Units 01/10/2021 12/31/2020 01/06/2021  Total Protein 6.5 - 8.1 g/dL 4.6(L) 5.4(L) 6.0(L)  Albumin 3.5 - 5.0 g/dL 2.4(L) 2.7(L) 3.1(L)  AST 15 - 41 U/L 33 28 78(H)  ALT 0 - 44 U/L 40 43 73(H)  Alk Phosphatase 38 - 126 U/L 106 138(H) 164(H)  Total Bilirubin 0.3 - 1.2 mg/dL 0.9 1.1 1.6(H)  Bilirubin, Direct 0.0 - 0.3 mg/dL - - -   PT/INR No results for input(s): LABPROT, INR in the last 72 hours. ABG No results for input(s): PHART, HCO3 in the last 72 hours.  Invalid input(s): PCO2, PO2  Studies/Results:  Anti-infectives: Anti-infectives (From admission, onward)   Start     Dose/Rate Route Frequency Ordered Stop   01/03/2021  0800  vancomycin (VANCOREADY) IVPB 1500 mg/300 mL        1,500 mg 150 mL/hr over 120 Minutes Intravenous Every 48 hours 01/02/2021 0702     01/07/21 2200  piperacillin-tazobactam (ZOSYN) IVPB 3.375 g  Status:  Discontinued       Note to Pharmacy: Pharmacy may modify dose for renal dysfunction. Pharmacy to manage and monitor antibiotic administration.   3.375 g 100 mL/hr over 30 Minutes Intravenous Every 8 hours 01/07/21 2014 01/07/21 2017   01/07/21 2200  piperacillin-tazobactam (ZOSYN) IVPB 3.375 g        3.375 g 12.5 mL/hr over 240 Minutes Intravenous Every 8 hours 01/07/21 2018     01/07/21 1030  vancomycin (VANCOREADY) IVPB 2000 mg/400 mL        2,000 mg 200 mL/hr over 120 Minutes Intravenous  Once 01/07/21 0929 01/07/21 1327   01/07/21 1000  ceFEPIme (MAXIPIME) 2 g in sodium chloride 0.9 % 100 mL IVPB  Status:  Discontinued        2 g 200 mL/hr over 30 Minutes Intravenous Every 12 hours 01/07/21 0928 01/07/21 2010   01/07/21 0930  vancomycin variable dose per unstable renal function (pharmacist dosing)  Status:  Discontinued         Does not apply See admin instructions 01/07/21 0930 12/23/2020 0814   01/02/21 1000  remdesivir  100 mg in sodium chloride 0.9 % 100 mL IVPB       "Followed by" Linked Group Details   100 mg 200 mL/hr over 30 Minutes Intravenous Daily 01/03/2021 1845 01/05/21 0921   12/23/2020 2000  remdesivir 200 mg in sodium chloride 0.9% 250 mL IVPB       "Followed by" Linked Group Details   200 mg 580 mL/hr over 30 Minutes Intravenous Once 12/28/2020 1845 12/29/2020 2017      Medications: Scheduled Meds: . chlorhexidine gluconate (MEDLINE KIT)  15 mL Mouth Rinse BID  . Chlorhexidine Gluconate Cloth  6 each Topical Daily  . enoxaparin (LOVENOX) injection  30 mg Subcutaneous Q24H  . insulin aspart  0-15 Units Subcutaneous Q4H  . lidocaine  1 application Other Once  . mouth rinse  15 mL Mouth Rinse 10 times per day  . methylPREDNISolone (SOLU-MEDROL) injection  60 mg  Intravenous Q12H  . pantoprazole (PROTONIX) IV  40 mg Intravenous Q24H  . sodium chloride flush  10-40 mL Intracatheter Q12H  . sodium zirconium cyclosilicate  10 g Per Tube Once   Continuous Infusions: . sodium chloride 75 mL/hr at 01/10/21 0700  . dextrose 75 mL/hr at 01/10/21 0851  . fentaNYL infusion INTRAVENOUS 200 mcg/hr (01/10/21 0713)  . levETIRAcetam 750 mg (01/10/21 0852)  . midazolam 0.5 mg/hr (01/10/21 0846)  . piperacillin-tazobactam (ZOSYN)  IV 12.5 mL/hr at 01/10/21 0700  . vancomycin Stopped (12/22/2020 0824)   PRN Meds:.acetaminophen, fentaNYL, fentaNYL (SUBLIMAZE) injection, hydrALAZINE, ipratropium-albuterol, labetalol, morphine injection, ondansetron (ZOFRAN) IV, sodium chloride flush  Assessment/Plan: Patient Active Problem List   Diagnosis Date Noted  . Acute respiratory disease due to COVID-19 virus 01/02/2021  . Elevated serum creatinine 01/02/2021  . Pneumonia due to COVID-19 virus 01/13/2021  . COVID-19 12/27/2020  . Gout 08/05/2020  . Need for hepatitis C screening test 08/05/2020  . Onychomycosis of multiple toenails with type 2 diabetes mellitus ([place]) 08/06/2019  . 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 B12 deficiency 10/31/2018  . Thrombocytosis 10/29/2018  . GERD without esophagitis 01/22/2018  . Asymptomatic gallstones 09/09/2015  . Routine general medical examination at a health care facility 02/10/2015  . Prostate nodule with urinary obstruction 07/17/2011  . Type II diabetes mellitus with manifestations ([place]) 08/05/2009  . Diabetic neuropathy, painful ([place]) 07/06/2009  . Hyperlipidemia with target LDL less than 100 04/22/2009  . Essential hypertension, benign 04/22/2009  . Erectile dysfunction associated with type 2 diabetes mellitus ([place]) 04/21/2009   COVID pneumonia withVDRF with likely HCAP as well -intubated, per CCM -solumedrol/remdexivir/tocilizumab - started on vanc/cefepime - WBC 10.9>26.1>31.8>36.8>33>44>41 Worsening  renal failure-2.7 today Peripheral vascular compromise- hypoperfusion noted  Pancreaticinsufficiency- On Creon at home, currently on hold Type 2 diabetes Hypertension Hyperlipidemia Gout Hx B12 deficiency 20 pack yr hx of tobacco use, none since age 62  POD 54, s/p ex lap with SBR x2 for ischemic bowel, open abdomen, Dr. Redmond Pulling 2/20 -patient left in discontinuity.  Plan to return to OR tomorrow for second look, possible further bowel resection, possible creation of ileostomy and closure.  Will call wife to discuss -some oozing from underneath the [facility].  hgb stable.  Seal in place.  Will continue to monitor at this point. -cont picc/TNA -cont NGT to LIWS at this point given discontinuity.  Do NOT put anything down NGT. -return to OR tomorrow as patient appears stable today at this point.  FEN: IVFs/TNA/NGT VTE: Lovenox ID: [email or web address]  Henreitta Cea , [facility] Surgery 01/10/2021, 9:01 AM  Please see Amion for pager number during day hours 7:00am-4:30pm or 7:00am -11:30am on weekends

## 2021-01-10 NOTE — Progress Notes (Addendum)
NAME:  Thomas Chung, MRN:  [id number], DOB:  05/08/1941, LOS: 9 ADMISSION DATE:  01/14/2021, CONSULTATION DATE: 01/06/2019 REFERRING MD: [facility], CHIEF COMPLAINT: Acute hypoxic respiratory failure  Brief History:  80 year old with history of Covid on 12/22/2020 who defervesced and required intubation on 01/06/2021.   Worsening lactic acidosis and leukocytosis 2/20 taken to OR found to have necrotic and ischemic small bowel s/p resection x 2 with wound vac.  Also questionable seizure activity, transferred to [facility] for LTM and further Neurology evaluation.   History of Present Illness:  80 year old former smoker quit when he was 24 he has a past medical history for Covid on 12/22/2020, along with type 2 diabetes with neuropathy, pancreatic insufficiency,  Hypertension, and  history of gastroesophageal reflux disease.  He was admitted on 01/03/2021 with worsening respiratory distress and suspected ileus.  On 01/04/2021 had increasing oxygen demand demands and by 01/06/2021 was on 100% 35 L flow of oxygen pulmonary critical care was asked to evaluate and he was intubated at that time.  He has completed antiviral therapy.  He remains on Solu-Medrol and Acterma.  Currently is on full mechanical ventilatory support ARDS protocol.    Past Medical History:   Past Medical History:  Diagnosis Date  . Depression   . Fatty liver   . Glucose intolerance (impaired glucose tolerance)   . HOH (hard of hearing)    bilateral hearing aides  . Hx of colonic polyps   . Hyperlipidemia   . Hypertension   . Neuromuscular disorder ([place])    neuropathy feet    Significant Hospital Events:  01/06/2021 intubated 2/18 improving oxygenation however persistent lactic acidosis and abdomen exam concerning for peritonitis. CT abdomen showed possible enteritis/ischemia. Gen surgery consulted. Also had episode of seizure like activity. Neuro consulted 2/19 Cr peaked. UOP improving. FIO2 40% 2/20 Worsening lactic acidosis concerning  for bowel ischemia. To OR with ischemic small bowel s/p resection x 2.  Questionable seizure activity, transferred to [facility] for LTM/ Neurology   Consults:  General surgery Critical care  Procedures:  01/06/2021 intubation  2/20 OR- ex lap/ resection x 2  Significant Diagnostic Tests:  2/18 ct abd>>segmental enteritis in right lower quadrant concerning for early ischemia. No bowel obstruction. Splenomegaly with findings for possible hypoperfusion/early infarct. Basilar lung with consolidation  Micro Data:  01/14/2021 BCx >> neg 01/05/2021 MRSA >> neg 01/07/2020 trach asp >> Klebsiella oxytoca (amp resistant, otherwise pan sensitive) 01/07/2021 blood cultures x2>> neg 01/07/2021 urine culture>>  Antimicrobials:  01/07/2021 vancomycin>> 01/07/2021 cefepime 01/07/2021 zosyn >>  remdesivir 2/12 >> 2/16 Decadron 2/12 Solumedrol 2/13 >> tocilizumab 2/16  Interim History / Subjective:  Some bleeding and leakage from wound vac  No further clinical seizures noted, MRI planned after EEG today  K 5.3 overnight s/p lokelma and triglycerides 567, changed from propofol to low dose versed gtt, remains on fentanyl gtt  Vent dyssynchrony this am- changed to BiVent Afebrile UOP 1225 ml/ 24 hr +53 ml/ net +1.67L Wound vac 1500 ml/ 24 hr Labs reviewed.  BUN 105-> 121, sCr 2.98-> 2.75, Hgb 12.2, WBC 44.3-> 41.6, glucose consistently > 200  Objective   Blood pressure (!) 168/83, pulse 70, temperature 97.7 F (36.5 C), temperature source Axillary, resp. rate (!) 26, height 6\' 4"  (1.93 m), weight 110.1 kg, SpO2 98 %.    Vent Mode: Bi-Vent FiO2 (%):  [50 %] 50 % Set Rate:  [26 bmp] 26 bmp Vt Set:  [610 mL] 610 mL PEEP:  [5 cmH20] 5  cmH20 Plateau Pressure:  [16 cmH20-31 cmH20] 31 cmH20   Intake/Output Summary (Last 24 hours) at 01/10/2021 0855 Last data filed at 01/10/2021 9518 Gross per 24 hour  Intake 2671.11 ml  Output 3075 ml  Net -403.89 ml   Filed Weights   01/08/21 0630 12/21/2020 1227  01/10/21 0600  Weight: 113.4 kg 114.9 kg 110.1 kg    Physical Exam: Exam per Dr. Tamala Julian   [facility] Problem list     Assessment & Plan:   Acute hypoxemic respiratory failure/ARDS secondary to ACZYS-06 complicated by Klebsiella oxytoca HCAP Tested positive for Covid on 12/22/2020 he was unvaccinated for COVID-19.  S/p remdesivir 2/12-2/16 and actemra 2/16 Plan:  Trial of BiVent this am given vent dyssynchrony F/u ABG 7.316/ 41.6/ 93/ 21 CXR in am  Target PaO2 55-65: titrate PEEP/ FiO2 per protocol Ventilator associated pneumonia prevention protocol Steroids -> day 10, will decrease to 40 mg daily today, consider stopping in several days PAD protocol with fentanyl gtt, low dose versed add.  Propofol stopped given elevated triglycerides, RASS goal -2/-3 w/ open abd   Ischemic small bowel s/p resection x 2 with wound vac 2/20 and open abd Sepsis secondary to above  Plan: Abd open Per CCS, plans to return to OR 2/22 Monitor wound vac drainage TPN per PICC  Continue zosyn/ vanc   Seizure-like activity  CT head neg. Neuro consulted 2/20.  Plan: Appreciate Neuro input.   EEG today, pending results will determine if LTM EEG needed MRI after EEG AEDs per Neurology, Keppra 750 mg BID Maintain neuro protective measures; goal for eurothermia, euglycemia, eunatermia, normoxia, and PCO2 goal of 35-40 Nutrition and bowel regiment  Seizure precautions    AKI - Improving sCr, UOP improved from yesterday  Worsening uremia, suspect platelet dysfunction  Hyperkalemia -  NGMA/ hyperchloremic acidosis Low ionized calcium  Plan: S/p lokelma by EMD Recheck BMET at noon  1 gm calcium gluconate x 1 D5W at 75 ml/hr  Will discuss DDAVP for uremia with attending  Continue foley  Trend BMP / urinary output Replace electrolytes as indicated Avoid nephrotoxic agents, ensure adequate renal perfusion  Normocytic anemia Plan:  Hgb stable thus far Recheck Hgb at noon given bloody  output from wound vac   Peripheral vascular compromise with bilateral lower extremities with blue toes noted Areas of hypoperfusion are marked. Requiring dopplers to find distal pulses. ABI wnl  Hypertension Monitor, supportive care PRN hydralazine for SBP > 180  Diabetes mellitus type 2 CBG (last 3)  Recent Labs    01/17/2021 1923 01/16/2021 2228 01/10/21 0333  GLUCAP 246* 226* 239*   SSI Moderate, will discuss with pharmacy given TPN and insulin.  Consider starting levemir   History of pancreatic insufficiency NPO, Creon is on hold   Best practice (evaluated daily)  Diet: NPO, TPN Pain/Anxiety/Delirium protocol (if indicated): PAD protocol VAP protocol (if indicated): Yes DVT prophylaxis: PPI Glucose control: Sliding scale insulin protocol Mobility: Bedrest Disposition: Intensive care unit Pending family update 2/21  Goals of Care:  Last date of multidisciplinary goals of care discussion: 01/06/2021 via phone Family and staff present: 01/06/2021 spoke with wife and daughter made them aware of impending intubation.Updated in person 2/17 per MD Summary of discussion: Agree they want intubation if necessary. Follow up goals of care discussion due:2/24 Code Status: Full code  Labs   CBC: Recent Labs  Lab 01/04/21 0421 01/05/21 0438 01/06/21 0258 01/07/21 0522 01/08/21 0242 01/13/2021 0535 01/10/21 0323  WBC 10.9* 26.1* 31.8* 36.8*  33.8* 44.3* 41.6*  NEUTROABS 9.6* 21.9* 27.7*  --  30.0*  --  39.9*  HGB 14.1 15.1 14.4 14.2 12.5* 12.7* 12.2*  HCT 43.8 46.4 44.3 44.3 39.2 40.1 37.7*  MCV 79.1* 78.0* 78.0* 80.4 80.8 80.7 80.7  PLT 354 290 220 139* 225 265 696    Basic Metabolic Panel: Recent Labs  Lab 01/06/21 0258 01/07/21 0522 01/07/21 0757 01/08/21 0242 01/08/21 1134 12/28/2020 0315 01/10/21 0323  NA 142   < > 143 142 145 144 145  K 4.3   < > 4.6 5.2* 4.6 4.2 5.3*  CL 108   < > 106 109 110 110 114*  CO2 26   < > 23 20* 21* 20* 20*  GLUCOSE 268*   < > 361*  323* 314* 255* 282*  BUN 41*   < > 77* 92* 98* 105* 121*  CREATININE 1.41*   < > 2.63* 3.10* 3.16* 2.98* 2.75*  CALCIUM 8.0*   < > 7.9* 7.6* 7.8* 7.6* 7.3*  MG 2.7*  --   --  3.3*  --   --  3.6*  PHOS 2.3*  --   --  3.7  --   --  6.5*   < > = values in this interval not displayed.   GFR: Estimated Creatinine Clearance: 29.6 mL/min (A) (by C-G formula based on SCr of 2.75 mg/dL (H)). Recent Labs  Lab 01/06/21 0916 01/07/21 0522 01/07/21 0801 01/08/21 0242 01/08/21 0908 01/08/21 1445 01/08/21 2052 01/06/2021 0300 01/05/2021 0535 01/16/2021 1115 01/10/21 0323  PROCALCITON 0.43  --   --   --   --   --   --   --   --   --   --   WBC  --  36.8*  --  33.8*  --   --   --   --  44.3*  --  41.6*  LATICACIDVEN  --   --    < > 3.2*   < > 2.9* 3.0* 3.6*  --  2.2*  --    < > = values in this interval not displayed.    Liver Function Tests: Recent Labs  Lab 01/04/21 0421 01/05/21 0438 01/06/21 0258 12/31/2020 0315 01/10/21 0323  AST 60* 78* 78* 28 33  ALT 49* 62* 73* 43 40  ALKPHOS 61 118 164* 138* 106  BILITOT 0.7 1.0 1.6* 1.1 0.9  PROT 5.8* 6.2* 6.0* 5.4* 4.6*  ALBUMIN 3.1* 3.3* 3.1* 2.7* 2.4*   No results for input(s): LIPASE, AMYLASE in the last 168 hours. No results for input(s): AMMONIA in the last 168 hours.  ABG    Component Value Date/Time   PHART 7.399 01/07/2021 0520   PCO2ART 35.1 01/07/2021 0520   PO2ART 64.3 (L) 01/07/2021 0520   HCO3 21.3 01/07/2021 0520   ACIDBASEDEF 2.4 (H) 01/07/2021 0520   O2SAT 92.1 01/07/2021 0520     Coagulation Profile: No results for input(s): INR, PROTIME in the last 168 hours.  Cardiac Enzymes: No results for input(s): CKTOTAL, CKMB, CKMBINDEX, TROPONINI in the last 168 hours.  HbA1C: Hgb A1c MFr Bld  Date/Time Value Ref Range Status  11/04/2020 02:29 PM 6.7 (H) 4.6 - 6.5 % Final    Comment:    Glycemic Control Guidelines for People with Diabetes:Non Diabetic:  <6%Goal of Therapy: <7%Additional Action Suggested:  >8%    08/05/2020 01:45 PM 6.5 (H) <5.7 % of total Hgb Final    Comment:    For someone without known diabetes, a hemoglobin  A1c value of 6.5% or greater indicates that they may have  diabetes and this should be confirmed with a follow-up  test. . For someone with known diabetes, a value <7% indicates  that their diabetes is well controlled and a value  greater than or equal to 7% indicates suboptimal  control. A1c targets should be individualized based on  duration of diabetes, age, comorbid conditions, and  other considerations. . Currently, no consensus exists regarding use of hemoglobin A1c for diagnosis of diabetes for children. .     CBG: Recent Labs  Lab 12/30/2020 1129 01/04/2021 1534 01/12/2021 1923 12/26/2020 2228 01/10/21 0333  GLUCAP 223* 244* 246* 226* 239*      Critical care time: 35 min     Kennieth Rad, ACNP [place] 01/10/2021, 8:55 AM

## 2021-01-10 NOTE — Progress Notes (Signed)
[place] Progress Note Patient Name: Thomas Chung DOB: [birth year] MRN: [id number]   Date of Service  01/10/2021  HPI/Events of Note  Multiple issues: 1. Elevated Triglycerides - Triglycerides = 567. Patient on a Propofol IV infusion and 2. Hyperkalemia - K+ = 5.3   eICU Interventions  Plan: 1. D/C Propofol IV infusion.. 2. Titrate Fentanyl IV infusion up as needed.  3. Lokelma 10 gm per tube now. 4. Repeat BMP at 12 noon.      Intervention Category Major Interventions: Electrolyte abnormality - evaluation and management;Other:  Lysle Dingwall 01/10/2021, 6:27 AM

## 2021-01-10 NOTE — Progress Notes (Signed)
RT transported pt to and from CT without event. 

## 2021-01-10 NOTE — Progress Notes (Signed)
EEG completed, results pending. 

## 2021-01-10 NOTE — Progress Notes (Signed)
Nutrition Follow-up  DOCUMENTATION CODES:   Obesity unspecified  INTERVENTION:   -TPN management per pharmacy -RD will follow for ability to start EN  NUTRITION DIAGNOSIS:   Increased nutrient needs related to acute illness,catabolic illness (CBJSE-83 infection) as evidenced by estimated needs.  Ongoing  GOAL:   Provide needs based on ASPEN/SCCM guidelines  Progressing   MONITOR:   Vent status,TF tolerance,Labs,Weight trends  REASON FOR ASSESSMENT:   Ventilator    ASSESSMENT:   80 y.o. male with medical history of controlled type 2 DM with neuropathy, pancreatic insufficiency, HTN, HLD, and GERD. He presented to the ED with progressive worsening weakness, diarrhea, very poor appetite, cough, and intermittent fever starting on 1/30. He tested positive for COVID as an outpatient.  2/17- intubated, NGT placed for decompression 2/20- s/p ex lap with SBR x 2, wound vac placement Reviewed I/O's: +53 ml x 24 hours and +1.7 L since admission,  PICC placed 2/21- TPN initiated   Patient is currently intubated on ventilator support MV: 16.3 L/min Temp (24hrs), Avg:97.9 F (36.6 C), Min:97.6 F (36.4 C), Max:98.2 F (36.8 C)  UOP: 1.2 L x 24 hours  NGT output: 350 ml x 24 hours  Drain output: 1.5 L x 24 hours  MAP: 107  Per general surgery notes, plan to return to OR tomorrow for possible further bowel resection, ileostomy, and closure.   Per pharmacy note, plan to initiate TPN at 40 mL/hr, which will provides 732 kcals and 60 grams protein, meeting 27% of estimated kcal needs and 35% of estimated protein needs. K, Mg, and Phos will not be added to TPN today secondary to elevated levels. Plan for slow advancement due to electrolyte imbalances.   Medications reviewed and include solu-medrol and D2 @ 75 ml/hr.   Labs reviewed: K: 5.3, Phos: 6.5, Mg: 3.6, CBGS: 239 (inpatient orders for glycemic control are 0-15 units insulin aspart every 4 hours).   NUTRITION -  FOCUSED PHYSICAL EXAM:  Flowsheet Row Most Recent Value  Orbital Region No depletion  Upper Arm Region No depletion  Thoracic and Lumbar Region No depletion  Buccal Region No depletion  Temple Region No depletion  Clavicle Bone Region No depletion  Clavicle and Acromion Bone Region No depletion  Scapular Bone Region No depletion  Dorsal Hand No depletion  Patellar Region No depletion  Anterior Thigh Region No depletion  Posterior Calf Region No depletion  Edema (RD Assessment) Mild  Hair Reviewed  Eyes Reviewed  Mouth Reviewed  Skin Reviewed  Nails Reviewed       Diet Order:   Diet Order            Diet NPO time specified  Diet effective midnight                 EDUCATION NEEDS:   Not appropriate for education at this time  Skin:  Skin Assessment: Skin Integrity Issues: Skin Integrity Issues:: Wound VAC,Incisions Wound Vac: abdominal incision Incisions: closed abdomen  Last BM:  01/03/21  Height:   Ht Readings from Last 1 Encounters:  01/05/21 6\' 4"  (1.93 m)    Weight:   Wt Readings from Last 1 Encounters:  01/10/21 110.1 kg    Ideal Body Weight:  91.82 kg  BMI:  Body mass index is 29.55 kg/m.  Estimated Nutritional Needs:   Kcal:  1517-6160  Protein:  170-220 grams  Fluid:  > 2 L    Loistine Chance, RD, LDN, [place] Registered Dietitian II Certified Diabetes Care and  Education Specialist Please refer to [facility] for RD and/or RD on-call/weekend/after hours pager

## 2021-01-10 NOTE — Plan of Care (Signed)
Routine EEG with continuous slow generalized.  No seizures or epileptiform discharges. No need for LTM. We will follow -- Amie Portland, MD Neurologist [facility] Pager: [phone]

## 2021-01-11 ENCOUNTER — Encounter (HOSPITAL_COMMUNITY): Admission: EM | Disposition: E | Payer: Self-pay | Source: Home / Self Care | Attending: Internal Medicine

## 2021-01-11 ENCOUNTER — Inpatient Hospital Stay (HOSPITAL_COMMUNITY): Payer: Medicare HMO

## 2021-01-11 ENCOUNTER — Inpatient Hospital Stay (HOSPITAL_COMMUNITY): Payer: Medicare HMO | Admitting: Certified Registered Nurse Anesthetist

## 2021-01-11 DIAGNOSIS — R7881 Bacteremia: Secondary | ICD-10-CM

## 2021-01-11 DIAGNOSIS — J80 Acute respiratory distress syndrome: Secondary | ICD-10-CM

## 2021-01-11 DIAGNOSIS — U071 COVID-19: Secondary | ICD-10-CM | POA: Diagnosis not present

## 2021-01-11 DIAGNOSIS — G928 Other toxic encephalopathy: Secondary | ICD-10-CM | POA: Diagnosis not present

## 2021-01-11 DIAGNOSIS — K559 Vascular disorder of intestine, unspecified: Secondary | ICD-10-CM | POA: Diagnosis not present

## 2021-01-11 DIAGNOSIS — J069 Acute upper respiratory infection, unspecified: Secondary | ICD-10-CM | POA: Diagnosis not present

## 2021-01-11 DIAGNOSIS — R6520 Severe sepsis without septic shock: Secondary | ICD-10-CM

## 2021-01-11 DIAGNOSIS — A419 Sepsis, unspecified organism: Secondary | ICD-10-CM

## 2021-01-11 HISTORY — PX: LAPAROTOMY: SHX154

## 2021-01-11 LAB — CBC
HCT: 39.2 % (ref 39.0–52.0)
Hemoglobin: 11.9 g/dL — ABNORMAL LOW (ref 13.0–17.0)
MCH: 25.4 pg — ABNORMAL LOW (ref 26.0–34.0)
MCHC: 30.4 g/dL (ref 30.0–36.0)
MCV: 83.6 fL (ref 80.0–100.0)
Platelets: 212 10*3/uL (ref 150–400)
RBC: 4.69 MIL/uL (ref 4.22–5.81)
RDW: 17.5 % — ABNORMAL HIGH (ref 11.5–15.5)
WBC: 51.6 10*3/uL (ref 4.0–10.5)
nRBC: 3.3 % — ABNORMAL HIGH (ref 0.0–0.2)

## 2021-01-11 LAB — TYPE AND SCREEN
ABO/RH(D): A POS
Antibody Screen: NEGATIVE

## 2021-01-11 LAB — BASIC METABOLIC PANEL
Anion gap: 10 (ref 5–15)
BUN: 138 mg/dL — ABNORMAL HIGH (ref 8–23)
CO2: 20 mmol/L — ABNORMAL LOW (ref 22–32)
Calcium: 7.4 mg/dL — ABNORMAL LOW (ref 8.9–10.3)
Chloride: 113 mmol/L — ABNORMAL HIGH (ref 98–111)
Creatinine, Ser: 2.34 mg/dL — ABNORMAL HIGH (ref 0.61–1.24)
GFR, Estimated: 28 mL/min — ABNORMAL LOW (ref >60)
Glucose, Bld: 391 mg/dL — ABNORMAL HIGH (ref 70–99)
Potassium: 5.5 mmol/L — ABNORMAL HIGH (ref 3.5–5.1)
Sodium: 143 mmol/L (ref 135–145)

## 2021-01-11 LAB — PHOSPHORUS: Phosphorus: 6.7 mg/dL — ABNORMAL HIGH (ref 2.5–4.6)

## 2021-01-11 LAB — ABO/RH: ABO/RH(D): A POS

## 2021-01-11 LAB — SURGICAL PCR SCREEN
MRSA, PCR: NEGATIVE
Staphylococcus aureus: NEGATIVE

## 2021-01-11 LAB — MAGNESIUM: Magnesium: 3.5 mg/dL — ABNORMAL HIGH (ref 1.7–2.4)

## 2021-01-11 LAB — SURGICAL PATHOLOGY

## 2021-01-11 SURGERY — LAPAROTOMY, EXPLORATORY
Anesthesia: General | Site: Abdomen

## 2021-01-11 MED ORDER — DEXAMETHASONE SODIUM PHOSPHATE 10 MG/ML IJ SOLN
INTRAMUSCULAR | Status: AC
  Filled 2021-01-11: qty 1

## 2021-01-11 MED ORDER — ROCURONIUM BROMIDE 10 MG/ML (PF) SYRINGE
PREFILLED_SYRINGE | INTRAVENOUS | Status: AC
  Filled 2021-01-11: qty 10

## 2021-01-11 MED ORDER — INSULIN REGULAR(HUMAN) IN NACL 100-0.9 UT/100ML-% IV SOLN
INTRAVENOUS | Status: DC
  Filled 2021-01-11: qty 100

## 2021-01-11 MED ORDER — ROCURONIUM BROMIDE 10 MG/ML (PF) SYRINGE
PREFILLED_SYRINGE | INTRAVENOUS | Status: DC | PRN
  Administered 2021-01-11: 100 mg via INTRAVENOUS

## 2021-01-11 MED ORDER — PROPOFOL 10 MG/ML IV BOLUS
INTRAVENOUS | Status: AC
  Filled 2021-01-11: qty 20

## 2021-01-11 MED ORDER — SUCCINYLCHOLINE CHLORIDE 200 MG/10ML IV SOSY
PREFILLED_SYRINGE | INTRAVENOUS | Status: AC
  Filled 2021-01-11: qty 10

## 2021-01-11 MED ORDER — MIDAZOLAM HCL 2 MG/2ML IJ SOLN
INTRAMUSCULAR | Status: AC
  Filled 2021-01-11: qty 2

## 2021-01-11 MED ORDER — PROPOFOL 1000 MG/100ML IV EMUL
INTRAVENOUS | Status: AC
  Filled 2021-01-11: qty 100

## 2021-01-11 MED ORDER — ONDANSETRON HCL 4 MG/2ML IJ SOLN
INTRAMUSCULAR | Status: AC
  Filled 2021-01-11: qty 4

## 2021-01-11 MED ORDER — PHENYLEPHRINE 40 MCG/ML (10ML) SYRINGE FOR IV PUSH (FOR BLOOD PRESSURE SUPPORT)
PREFILLED_SYRINGE | INTRAVENOUS | Status: AC
  Filled 2021-01-11: qty 10

## 2021-01-11 MED ORDER — INSULIN REGULAR(HUMAN) IN NACL 100-0.9 UT/100ML-% IV SOLN
INTRAVENOUS | Status: DC
  Administered 2021-01-11: 6.5 [IU]/h via INTRAVENOUS
  Administered 2021-01-12: 2.8 [IU]/h via INTRAVENOUS
  Filled 2021-01-11 (×2): qty 100

## 2021-01-11 MED ORDER — PROPOFOL 10 MG/ML IV BOLUS
INTRAVENOUS | Status: DC | PRN
  Administered 2021-01-11 (×2): 50 mg via INTRAVENOUS

## 2021-01-11 MED ORDER — MUPIROCIN 2 % EX OINT
1.0000 "application " | TOPICAL_OINTMENT | Freq: Two times a day (BID) | CUTANEOUS | Status: AC
  Administered 2021-01-11 – 2021-01-15 (×10): 1 via NASAL
  Filled 2021-01-11: qty 22

## 2021-01-11 MED ORDER — LACTATED RINGERS IV SOLN: INTRAVENOUS | Status: DC

## 2021-01-11 MED ORDER — MIDAZOLAM HCL 2 MG/2ML IJ SOLN
INTRAMUSCULAR | Status: DC | PRN
  Administered 2021-01-11 (×2): 2 mg via INTRAVENOUS

## 2021-01-11 MED ORDER — LACTATED RINGERS IV SOLN: INTRAVENOUS | Status: DC | PRN

## 2021-01-11 MED ORDER — TRAVASOL 10 % IV SOLN
INTRAVENOUS | Status: AC
  Filled 2021-01-11: qty 604.8

## 2021-01-11 MED ORDER — SUGAMMADEX SODIUM 200 MG/2ML IV SOLN
INTRAVENOUS | Status: DC | PRN
  Administered 2021-01-11: 400 mg via INTRAVENOUS

## 2021-01-11 MED ORDER — FENTANYL CITRATE (PF) 250 MCG/5ML IJ SOLN: INTRAMUSCULAR | Status: DC | PRN

## 2021-01-11 MED ORDER — FENTANYL CITRATE (PF) 250 MCG/5ML IJ SOLN
INTRAMUSCULAR | Status: AC
  Filled 2021-01-11: qty 5

## 2021-01-11 MED ORDER — EPHEDRINE 5 MG/ML INJ
INTRAVENOUS | Status: AC
  Filled 2021-01-11: qty 10

## 2021-01-11 MED ORDER — DEXTROSE 50 % IV SOLN: 0.0000 mL | INTRAVENOUS | Status: DC | PRN

## 2021-01-11 MED ORDER — 0.9 % SODIUM CHLORIDE (POUR BTL) OPTIME
TOPICAL | Status: DC | PRN
  Administered 2021-01-11: 4000 mL

## 2021-01-11 MED ORDER — FENTANYL CITRATE (PF) 250 MCG/5ML IJ SOLN
INTRAMUSCULAR | Status: DC | PRN
  Administered 2021-01-11 (×5): 50 ug via INTRAVENOUS

## 2021-01-11 MED ORDER — LIDOCAINE 2% (20 MG/ML) 5 ML SYRINGE
INTRAMUSCULAR | Status: AC
  Filled 2021-01-11: qty 5

## 2021-01-11 SURGICAL SUPPLY — 54 items
APL PRP STRL LF DISP 70% ISPRP (MISCELLANEOUS)
APL SKNCLS STERI-STRIP NONHPOA (GAUZE/BANDAGES/DRESSINGS) ×4
BENZOIN TINCTURE PRP APPL 2/3 (GAUZE/BANDAGES/DRESSINGS) ×4 IMPLANT
CANISTER WOUNDNEG PRESSURE 500 (CANNISTER) ×2 IMPLANT
CHLORAPREP W/TINT 26 (MISCELLANEOUS) ×1 IMPLANT
CLIP VESOCCLUDE LG 6/CT (CLIP) ×1 IMPLANT
CLIP VESOCCLUDE MED 6/CT (CLIP) ×1 IMPLANT
CLIP VESOCCLUDE SM WIDE 6/CT (CLIP) ×1 IMPLANT
COVER SURGICAL LIGHT HANDLE (MISCELLANEOUS) ×3 IMPLANT
COVER WAND RF STERILE (DRAPES) ×1 IMPLANT
DRAPE LAPAROSCOPIC ABDOMINAL (DRAPES) ×3 IMPLANT
DRAPE WARM FLUID 44X44 (DRAPES) ×3 IMPLANT
DRSG OPSITE POSTOP 4X10 (GAUZE/BANDAGES/DRESSINGS) IMPLANT
DRSG OPSITE POSTOP 4X8 (GAUZE/BANDAGES/DRESSINGS) IMPLANT
DRSG TEGADERM 2-3/8X2-3/4 SM (GAUZE/BANDAGES/DRESSINGS) ×2 IMPLANT
DRSG VAC ATS LRG SENSATRAC (GAUZE/BANDAGES/DRESSINGS) ×2 IMPLANT
ELECT BLADE 6.5 EXT (BLADE) IMPLANT
ELECT CAUTERY BLADE 6.4 (BLADE) ×3 IMPLANT
ELECT REM PT RETURN 9FT ADLT (ELECTROSURGICAL) ×3
ELECTRODE REM PT RTRN 9FT ADLT (ELECTROSURGICAL) ×2 IMPLANT
GAUZE SPONGE 2X2 8PLY STRL LF (GAUZE/BANDAGES/DRESSINGS) ×1 IMPLANT
GLOVE SURG SS PI 7.0 STRL IVOR (GLOVE) ×3 IMPLANT
GLOVE SURG UNDER POLY LF SZ7 (GLOVE) ×3 IMPLANT
GOWN STRL REUS W/ TWL LRG LVL3 (GOWN DISPOSABLE) ×4 IMPLANT
GOWN STRL REUS W/TWL LRG LVL3 (GOWN DISPOSABLE) ×6
HANDLE SUCTION POOLE (INSTRUMENTS) ×2 IMPLANT
KIT BASIN OR (CUSTOM PROCEDURE TRAY) ×3 IMPLANT
LIGASURE IMPACT 36 18CM CVD LR (INSTRUMENTS) ×4 IMPLANT
LOOP VESSEL MAXI BLUE (MISCELLANEOUS) IMPLANT
LOOP VESSEL MINI RED (MISCELLANEOUS) IMPLANT
NEEDLE 22X1 1/2 (OR ONLY) (NEEDLE) ×1 IMPLANT
PACK GENERAL/GYN (CUSTOM PROCEDURE TRAY) ×3 IMPLANT
PENCIL SMOKE EVACUATOR (MISCELLANEOUS) ×3 IMPLANT
RELOAD AUTO 90-3.5 TA90 BLE (ENDOMECHANICALS) ×3 IMPLANT
RELOAD PROXIMATE 75MM BLUE (ENDOMECHANICALS) ×3 IMPLANT
RELOAD STAPLE 75 3.8 BLU REG (ENDOMECHANICALS) IMPLANT
RELOAD STAPLE 90 BLU REG (ENDOMECHANICALS) IMPLANT
SPONGE GAUZE 2X2 STER 10/PKG (GAUZE/BANDAGES/DRESSINGS) ×1
SPONGE LAP 18X18 RF (DISPOSABLE) IMPLANT
STAPLER 90 3.5 STAND SLIM (STAPLE) ×3
STAPLER 90 3.5 STD SLIM (STAPLE) ×1 IMPLANT
STAPLER PROXIMATE 75MM BLUE (STAPLE) ×2 IMPLANT
STAPLER VISISTAT 35W (STAPLE) ×3 IMPLANT
SUCTION POOLE HANDLE (INSTRUMENTS) ×3
SUT PDS AB 1 TP1 96 (SUTURE) IMPLANT
SUT SILK 2 0 (SUTURE) ×3
SUT SILK 2 0 SH CR/8 (SUTURE) ×3 IMPLANT
SUT SILK 2-0 18XBRD TIE 12 (SUTURE) ×2 IMPLANT
SUT SILK 3 0 (SUTURE) ×3
SUT SILK 3 0 SH CR/8 (SUTURE) ×3 IMPLANT
SUT SILK 3-0 18XBRD TIE 12 (SUTURE) ×2 IMPLANT
TOWEL GREEN STERILE (TOWEL DISPOSABLE) ×3 IMPLANT
TRAY FOLEY MTR SLVR 16FR STAT (SET/KITS/TRAYS/PACK) ×1 IMPLANT
YANKAUER SUCT BULB TIP NO VENT (SUCTIONS) ×2 IMPLANT

## 2021-01-11 NOTE — Progress Notes (Signed)
Neurology Progress Note   S:// Seen and examined. No acute events overnight. Follow some commands overnight with minimized sedation. Probable OR again tomorrow due to significant bump in the white count.  O:// Current vital signs: BP (!) 148/72   Pulse 93   Temp (!) 97.52 F (36.4 C)   Resp 17   Ht $R'6\' 4"'hR$  (1.93 m)   Wt 115.4 kg   SpO2 95%   BMI 30.97 kg/m  Vital signs in last 24 hours: Temp:  [96.8 F (36 C)-97.9 F (36.6 C)] 97.52 F (36.4 C) (02/22 0700) Pulse Rate:  [72-102] 93 (02/22 0700) Resp:  [9-25] 17 (02/22 0700) BP: (142-180)/(63-91) 148/72 (02/22 0700) SpO2:  [91 %-100 %] 95 % (02/22 0742) FiO2 (%):  [50 %] 50 % (02/22 0742) Weight:  [115.4 kg] 115.4 kg (02/22 0513) On sedation with fentanyl that was held for about 15 minutes. General: Ill-appearing man intubated HEENT: Normocephalic, atraumatic, ET tube in place Cardiovascular: Regular rate rhythm Extremities well perfused Neurological exam Off of fentanyl for about 15 minutes Opens eyes to loud voice Attempts to track the examiner Was able to close the eyes to command Unable to stick tongue out due to the tube Did not wiggle toes or hands to command.  Medications  Current Facility-Administered Medications:  .  acetaminophen (TYLENOL) suppository 650 mg, 650 mg, Rectal, Q6H PRN, Greer Pickerel, MD .  chlorhexidine gluconate (MEDLINE KIT) (PERIDEX) 0.12 % solution 15 mL, 15 mL, Mouth Rinse, BID, Greer Pickerel, MD, 15 mL at 01/10/21 2102 .  Chlorhexidine Gluconate Cloth 2 % PADS 6 each, 6 each, Topical, Daily, Greer Pickerel, MD, 6 each at 01/10/21 2344 .  dextrose 5 % solution, , Intravenous, Continuous, Candee Furbish, MD, Last Rate: 75 mL/hr at 01/07/2021 0700, Infusion Verify at 01/14/2021 0700 .  enoxaparin (LOVENOX) injection 30 mg, 30 mg, Subcutaneous, Q24H, Greer Pickerel, MD, 30 mg at 01/10/21 2332 .  fentaNYL (SUBLIMAZE) bolus via infusion 25 mcg, 25 mcg, Intravenous, Q15 min PRN, Greer Pickerel, MD, 25  mcg at 01/10/21 1316 .  fentaNYL (SUBLIMAZE) injection 50-200 mcg, 50-200 mcg, Intravenous, Q30 min PRN, Greer Pickerel, MD, 50 mcg at 01/08/2021 0259 .  fentaNYL 2544mcg in NS 251mL (59mcg/ml) infusion-PREMIX, 25-200 mcg/hr, Intravenous, Continuous, Greer Pickerel, MD, Last Rate: 17.5 mL/hr at 01/17/2021 0825, 175 mcg/hr at 12/28/2020 0825 .  hydrALAZINE (APRESOLINE) injection 10 mg, 10 mg, Intravenous, Q6H PRN, Greer Pickerel, MD, 10 mg at 01/10/21 2347 .  insulin aspart (novoLOG) injection 0-20 Units, 0-20 Units, Subcutaneous, Q4H, Jennelle Human B, NP, 7 Units at 01/06/2021 [phone] .  ipratropium-albuterol (DUONEB) 0.5-2.5 (3) MG/3ML nebulizer solution 3 mL, 3 mL, Nebulization, Q6H PRN, Margaretha Seeds, MD .  labetalol (NORMODYNE) injection 10 mg, 10 mg, Intravenous, Q4H PRN, Greer Pickerel, MD, 10 mg at 01/10/21 1134 .  levETIRAcetam (KEPPRA) 750 mg in sodium chloride 0.9 % 100 mL IVPB, 750 mg, Intravenous, Q12H, Greer Pickerel, MD, Stopped at 01/01/2021 0007 .  lidocaine (XYLOCAINE) 2 % jelly 1 application, 1 application, Other, Once, Greer Pickerel, MD .  MEDLINE mouth rinse, 15 mL, Mouth Rinse, 10 times per day, Greer Pickerel, MD, 15 mL at 12/27/2020 0533 .  methylPREDNISolone sodium succinate (SOLU-MEDROL) 40 mg/mL injection 40 mg, 40 mg, Intravenous, Daily, Jennelle Human B, NP, 40 mg at 01/10/21 1134 .  midazolam (VERSED) injection 2 mg, 2 mg, Intravenous, Q1H PRN, Candee Furbish, MD, 2 mg at 01/10/21 2348 .  mupirocin ointment (BACTROBAN) 2 % 1 application, 1  application, Nasal, BID, Candee Furbish, MD, 1 application at 22/29/79 [phone] .  pantoprazole (PROTONIX) injection 40 mg, 40 mg, Intravenous, Q24H, Greer Pickerel, MD, 40 mg at 01/10/21 1044 .  piperacillin-tazobactam (ZOSYN) IVPB 3.375 g, 3.375 g, Intravenous, Q8H, Greer Pickerel, MD, Last Rate: 12.5 mL/hr at 01/04/2021 0700, Infusion Verify at 01/02/2021 0700 .  sodium chloride flush (NS) 0.9 % injection 10-40 mL, 10-40 mL, Intracatheter, Q12H, Margaretha Seeds,  MD, 10 mL at 01/10/21 2200 .  sodium chloride flush (NS) 0.9 % injection 10-40 mL, 10-40 mL, Intracatheter, PRN, Margaretha Seeds, MD, 10 mL at 01/12/2021 2158 .  sodium zirconium cyclosilicate (LOKELMA) packet 10 g, 10 g, Per Tube, Once, Oletta Darter Virgina Evener, MD .  TPN ADULT (ION), , Intravenous, Continuous TPN, Bertis Ruddy, [facility], Last Rate: 40 mL/hr at 01/08/2021 0700, Infusion Verify at 01/07/2021 0700 Labs CBC    Component Value Date/Time   WBC 51.6 (HH) 12/23/2020 0354   RBC 4.69 12/25/2020 0354   HGB 11.9 (L) 01/04/2021 0354   HGB 14.8 12/29/2020 1123   HCT 39.2 01/01/2021 0354   HCT 44.9 12/29/2020 1123   PLT 212 12/26/2020 0354   PLT 353 12/29/2020 1123   MCV 83.6 01/04/2021 0354   MCV 78 (L) 12/29/2020 1123   MCH 25.4 (L) 12/24/2020 0354   MCHC 30.4 12/29/2020 0354   RDW 17.5 (H) 01/06/2021 0354   RDW 15.8 (H) 12/29/2020 1123   LYMPHSABS 0.8 01/10/2021 0323   MONOABS 0.8 01/10/2021 0323   EOSABS 0.0 01/10/2021 0323   BASOSABS 0.0 01/10/2021 0323    CMP     Component Value Date/Time   NA 143 01/05/2021 0354   NA 133 (L) 12/29/2020 1123   K 5.5 (H) 01/13/2021 0354   CL 113 (H) 01/08/2021 0354   CO2 20 (L) 01/08/2021 0354   GLUCOSE 391 (H) 12/24/2020 0354   BUN 138 (H) 01/14/2021 0354   BUN 36 (H) 12/29/2020 1123   CREATININE 2.34 (H) 01/17/2021 0354   CREATININE 1.20 (H) 08/05/2020 1345   CALCIUM 7.4 (L) 01/17/2021 0354   PROT 4.6 (L) 01/10/2021 0323   PROT 6.5 12/29/2020 1123   ALBUMIN 2.4 (L) 01/10/2021 0323   ALBUMIN 4.3 12/29/2020 1123   AST 33 01/10/2021 0323   ALT 40 01/10/2021 0323   ALKPHOS 106 01/10/2021 0323   BILITOT 0.9 01/10/2021 0323   BILITOT 0.5 12/29/2020 1123   GFRNONAA 28 (L) 01/11/2021 0354   GFRNONAA 58 (L) 08/05/2020 1345   GFRAA 48 (L) 12/29/2020 1123   GFRAA 67 08/05/2020 1345   2D echocardiogram with LVEF 55 to 60% with severe asymmetric left ventricular hypertrophy of the basal septal segment and indeterminate diastolic filling due to  E-a fusion.  Right ventricular systolic function is normal.  Mitral valve is normal.  Left atrium size normal  Imaging I have reviewed images in epic and the results pertinent to this consultation are: CT head with no acute changes MRI brain was done yesterday with small foci of abnormal restricted diffusion in the right thalamus likely post ictal or subacute ischemia.   Assessment: 80 year old man presenting with weakness and diarrhea, also positive for Covid, initially treated at home conservatively but then aggressive treatment started including intubation after he started having respiratory difficulties.  Also noted to have small bowel obstruction for which she went to the OR 2 days ago and plan is for possible repeat exploratory laparotomy today and due to increasing white count. Neurology was consulted because when  he was started off on sedation after intubation, he had some right-sided twitching. No twitching after being loaded with Keppra and starting Keppra. He has all brainstem reflexes intact and is somewhat more awake off sedation today. EEG negative for seizures MRI brain showed a small area of abnormality in the right thalamus-radiology read concerning subacute ischemia versus postictal.  I think this is simply incidental.   Impression: Multifactorial toxic metabolic encephalopathy Concern for seizure/status epilepticus-resolved MRI finding with right thalamic signal abnormality-likely incidental and not a true symptomatic stroke.  Recommendations: With somewhat improving exam even with worsening leukocytosis, I do not feel the need for any escalation of antiepileptics.  Continue antiepileptics at current dose. He needs management of medical and surgical derangements per primary team and surgical teams as you are. Thalamic signal abnormality on MRI is likely incidental and not a true stroke.  2D echo has already been done.  No cardiac source for embolus.  Will not pursue further  stroke work-up.  Aspirin when okay post surgically.  Neurology will be available as needed  Plan discussed with Dr. Tamala Julian. -- Amie Portland, MD Neurologist [facility] Pager: [phone]  CRITICAL CARE ATTESTATION Performed by: Amie Portland, MD Total critical care time: 32 minutes Critical care time was exclusive of separately billable procedures and treating other patients and/or supervising APPs/Residents/Students Critical care was necessary to treat or prevent imminent or life-threatening deterioration due to toxic metabolic encephalopathy, concern for seizures. This patient is critically ill and at significant risk for neurological worsening and/or death and care requires constant monitoring. Critical care was time spent personally by me on the following activities: development of treatment plan with patient and/or surrogate as well as nursing, discussions with consultants, evaluation of patient's response to treatment, examination of patient, obtaining history from patient or surrogate, ordering and performing treatments and interventions, ordering and review of laboratory studies, ordering and review of radiographic studies, pulse oximetry, re-evaluation of patient's condition, participation in multidisciplinary rounds and medical decision making of high complexity in the care of this patient.

## 2021-01-11 NOTE — Op Note (Signed)
Preoperative diagnosis: small intestine ischemia  Postoperative diagnosis: same   Procedure: reopening of recent laparotomy, anastomosis of small intestine x 2  Surgeon: Gurney Maxin, M.D.  Asst: Melina Modena  Anesthesia: general  Indications for procedure: Thomas Chung is a 80 y.o. year old male with symptoms of worsening sepsis and was taken to the OR for exploration and found to have 2 areas of ischemia. Due to level of shock he was left in discontinuity. He presents today for reexploration.  Description of procedure: The patient was brought into the operative suite. Anesthesia was administered with General endotracheal anesthesia. WHO checklist was applied. The patient was then placed in supine position. The area was prepped and draped in the usual sterile fashion.  Next, the sponge was removed. The small intestine was assessed from Ligament of Trietz to the ileocecal valve. No further necrosis of the intestine was seen. The terminal ileum had necrotic fat in 2 areas but that was isolated away from the intestine. Decision was made to perform anastomoses.  At the proximal bowel resection, a side to side functional end to end anastomosis was performed with 75 mm blue GIA and 90 mm TA for the enterotomy. 2-0 silk in figure of eight fashion was used to close the mesenteric defect.  Next, at the distal bowel resection, a side to side functional end to end anastomosis was performed with 75 mm blue GIA and 90 mm TA for the enterotomy. 2-0 silk in figure of eight fashion was used to close the mesenteric defect.  The abdomen was irrigated with 3 l of saline. The fascia was closed with a running 0 PDS. Black sponge vac was placed in the subcutaneous area. The patient returned to the ICU in critical condition. All counts were correct.  Findings: no ischemia of small bowel, 2 intact anastomoses  Specimen: none  Implant: black sponge vac   Blood loss: 50 ml  Local anesthesia:  none  Complications: none  Gurney Maxin, M.D. General, Bariatric, & Minimally Invasive Surgery [facility]

## 2021-01-11 NOTE — Progress Notes (Signed)
PHARMACY - TOTAL PARENTERAL NUTRITION CONSULT NOTE   Indication: Prolonged ileus  Patient Measurements: Height: 6\' 4"  (193 cm) Weight: 115.4 kg (254 lb 6.6 oz) IBW/kg (Calculated) : 86.8 TPN AdjBW (KG): 93.7 Body mass index is 30.97 kg/m. Usual Weight: 120 kg on 11/04/20  Assessment:  80 yo male with hx DM2, neuropathy, pancreatic insufficiency, hypertension, GERD who required intubation 2/17 for COVID pneumonia.  He developed lactic acidosis concerning for peritonitis, CT showed possible enteritis/ischemia.  On 2/20, developed worsening lactic acidosis concerning for bowel ischemia and is now s/p small bowel resection times two, placement abthera wound vac.  Pharmacy consulted for TPN.  Glucose / Insulin: Hx DM2, A1c 6.7 on 11/04/20. CBG range 200s (steroids) - glucose 391 this am - 29 units moderate scale SSI q4h used in past 24hr - MD started insulin drip Electrolytes: K 5.5, Cl 113, Bicarb 20, Phos 6.7, Mg 3.5, CorCa low Renal: AKI, SCr 2.98>2.75>2.34, BUN rising 105>121>138. UOP 0.6 ml/kg/hr. LFTs / TGs: AlkPhos 138>106, AST/ALT/Tbili wnl, TG 567 (propofol stopped) Prealbumin / albumin: PAB 22.1; Alb 2.7>2.4 Intake / Output: UOP 0.6 ml/kg/hr, NG 0 mL, Drain 450 ml MIVF: D5 at 75 ml/hr - d/c GI Imaging: 2/18 CT abd: segmental enteritis in right lower quadrant concerning for early ischemia. No bowel obstruction. Surgeries / Procedures:  2/20: ex-lap with small bowel resection x 2 Plan for return to OR 2/22 potentially more resection of bowel, ileostomy + closure  Central access: PICC 2/20 TPN start date: plan 2/21  Nutritional Goals (RD recommendations 2/18): KCal: 8372-9021 , >/= 150g Protein: , Fluid: >/= 3 L/day Goal TPN rate is 100 mL/hr provides 151 g of protein/day -  Average 2033 Kcal with lipids only MWF (20g/L) due to national shortage  Current Nutrition:  NPO  Plan:  Will not advance TPN due to hyperglycemia - continue TPN at 40 mL/hr Hold lipids for  hypertriglyceridemia  Electrolytes in TPN: No K/phos/Mg with TPN start, Ca at 5 mEq/L, minimal Na. JD:[id number] to 1:2. Continue mod SSI q 4h, May inc to resistant SSI and consider addition of insulin to TPN based on response tomorrow D/c D5W at 75 ml/hr - CCM wanted to start LR due to hypernatremia Insulin drip added by CCM Add standard MVI and trace elements to TPN BMP, Mg, Phos in AM  Alanda Slim, PharmD, [facility] Clinical Pharmacist Please see AMION for all Pharmacists' Contact Phone Numbers 01/14/2021, 10:29 AM

## 2021-01-11 NOTE — Progress Notes (Signed)
NAME:  Thomas Chung, MRN:  [id number], DOB:  1941-06-01, LOS: 77 ADMISSION DATE:  12/23/2020, CONSULTATION DATE: 01/06/2019 REFERRING MD: [facility]ad, CHIEF COMPLAINT: Acute hypoxic respiratory failure  Brief History:  80 year old with history of Covid on 12/22/2020 who defervesced and required intubation on 01/06/2021.   Worsening lactic acidosis and leukocytosis 2/20 taken to OR found to have necrotic and ischemic small bowel s/p resection x 2 with wound vac.  Also questionable seizure activity, transferred to [facility] for LTM and further Neurology evaluation.   History of Present Illness:  80 year old former smoker quit when he was 37 he has a past medical history for Covid on 12/22/2020, along with type 2 diabetes with neuropathy, pancreatic insufficiency,  Hypertension, and  history of gastroesophageal reflux disease.  He was admitted on 12/21/2020 with worsening respiratory distress and suspected ileus.  On 01/04/2021 had increasing oxygen demand demands and by 01/06/2021 was on 100% 35 L flow of oxygen pulmonary critical care was asked to evaluate and he was intubated at that time.  He has completed antiviral therapy.  He remains on Solu-Medrol and Acterma.  Currently is on full mechanical ventilatory support ARDS protocol.    Past Medical History:   Past Medical History:  Diagnosis Date  . Depression   . Fatty liver   . Glucose intolerance (impaired glucose tolerance)   . HOH (hard of hearing)    bilateral hearing aides  . Hx of colonic polyps   . Hyperlipidemia   . Hypertension   . Neuromuscular disorder ([place])    neuropathy feet    Significant Hospital Events:  01/06/2021 intubated 2/18 improving oxygenation however persistent lactic acidosis and abdomen exam concerning for peritonitis. CT abdomen showed possible enteritis/ischemia. Gen surgery consulted. Also had episode of seizure like activity. Neuro consulted 2/19 Cr peaked. UOP improving. FIO2 40% 2/20 Worsening lactic acidosis  concerning for bowel ischemia. To OR with ischemic small bowel s/p resection x 2.  Questionable seizure activity, transferred to [facility] for LTM/ Neurology   Consults:  General surgery Critical care  Procedures:  2/17 ETT >> 2/20 OR- ex lap/ resection x 2 w/ open abd  2/20 RUE PICC >>  Significant Diagnostic Tests:  2/18 ct abd>>segmental enteritis in right lower quadrant concerning for early ischemia. No bowel obstruction. Splenomegaly with findings for possible hypoperfusion/early infarct. Basilar lung with consolidation  2/21 MRI brain >> 1. Small foci of abnormal diffusion restriction in the right thalamus, possibly post-ictal or subacute ischemia. 2. Areas of magnetic susceptibility effect in the basal ganglia and thalami likely indicate petechial blood superimposed on chronic mineralization. Additionally, susceptibility artifacts can create false positives on diffusion-weighted imaging.  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 rate 17, height 6\' 4"  (1.93 m), weight 115.4 kg, SpO2 95 %. CVP:  [5 mmHg] 5 mmHg  Vent Mode: Bi-Vent FiO2 (%):  [50 %] 50 % Set Rate:  [20 bmp]  20 bmp PEEP:  [5 cmH20] 5 cmH20   Intake/Output Summary (Last 24 hours) at 01/04/2021 6301 Last data filed at 12/24/2020 0700 Gross per 24 hour  Intake  2928.49 ml  Output 1975 ml  Net 953.49 ml   Filed Weights   12/25/2020 1227 01/10/21 0600 01/02/2021 0513  Weight: 114.9 kg 110.1 kg 115.4 kg   Physical Exam: General:  Critically ill male sedated on MV in NAD HEENT: MM pink/moist, ETT, L NGTw/ bilious output Neuro: sedated, does not follow commands for me CV:rr, no murmur, RUE PICC wnl PULM:  Non labored, lungs clear, diminished in bases, on Bilevel ventilation GI: soft, midline incision with wound vac- intact, no leak Extremities: cool/dry, +1 upper and lower dependent edema, bilateral pedal pulses +1 palpable, ischemic toes  UOP 1525 ml/ 24hr, 0.6 ml/kg/hr 450 ml out of wound vac/ 24 hrs Remains +957 ml/ net +2.6L 2/22 CXR reviewed, bilateral interstitial infiltrates still present, stable ETT/ NGT/ PICC Labs and micro reviewed  - clostridium cadaveris growing out of aerobic and anaerobic bottles- pharmacy to verify with micro - K stable 5.5, no EKG changes - glucose rising despite increasing to SSI resistant yest and insulin in TPN - BUN 128-> 138, sCr 2.55-> 2.34 - WBC 41.6-> 51.6  Resolved Hospital Problem list     Assessment & Plan:   Acute hypoxemic respiratory failure/ARDS secondary to [id number] complicated by Klebsiella oxytoca HCAP Tested positive for Covid on 12/22/2020 he was unvaccinated for COVID-19.  S/p remdesivir 2/12-2/16 and actemra 2/16 Plan:  Continue BiVent for now Intermittent CXR/ ABG Target PaO2 55-65: titrate PEEP/ FiO2 per protocol Ventilator associated pneumonia prevention protocol Steroids -continue solumedrol for 2 day then stop, weaned from BID to daily dosing 2/21 PAD protocol with fentanyl gtt, prn versed, RASS goal -2/-3 w/ open abd   Ischemic small bowel s/p resection x 2 with wound vac 2/20 and open abd Sepsis secondary to above  Plan: Back to OR this am for possible resection and possible ileostomy, ? With increased WBC further ischemia TPN per pharmacy via PICC Continue zosyn/ vanc for  now, discussing with pharmacy/ attending, and will need to get surgery input- depending on what is found in surgery this morning, ? Adding fungal coverage BC - growing 1/2  clostridium cadaveris, pharmacy to confirm with micro, ID will see, appreciate input, GI is likely source and is covered by zosyn   Seizure-like activity  CT head neg. Neuro consulted 2/20.  Plan: Appreciate Neuro input.   EEG neg MRI showed small right thalamic signal abnormality, thought to be incidental by Neurology and not a true symptomatic stroke To continue AEDs, per Neurology, same dose, Keppra 750 mg BID Maintain neuro protective measures; goal for eurothermia, euglycemia, eunatermia, normoxia, and PCO2 goal of 35-40 Seizure precautions  Ongoing neuro exams   AKI -  sCr stable, UOP continues to improve daily  Worsening uremia, suspect platelet dysfunction s/p DDAVP 2/21 Hyperkalemia - stable without EKG changes thus far NGMA/ hyperchloremic acidosis Low ionized calcium s/p replete 2/21 and in TPN Plan: Continue D5W at 75 ml/hr  Continue foley  Trend BMP / urinary output Replace electrolytes as indicated Avoid nephrotoxic agents, ensure adequate renal perfusion  Normocytic anemia Plan:  Hgb remains stable Trend CBC, transfuse for Hgb < 7  Peripheral vascular compromise with bilateral lower extremities with blue toes noted Areas of hypoperfusion are marked. Has been requiring dopplers but today has been palpable.  ABI wnl  Hypertension Monitor, supportive care PRN hydralazine  for SBP > 180  Diabetes mellitus type 2 CBG (last 3)  Recent Labs    01/10/21 0333 01/10/21 0953 01/10/21 1154  GLUCAP 239* 239* 225*   Changed to SSI resistant 2/21 and insulin in TPN.  Still uncontrolled and > 300. Will start insulin gtt.    History of pancreatic insufficiency NPO, Creon is on hold   Best practice (evaluated daily)  Diet: NPO, TPN Pain/Anxiety/Delirium protocol (if indicated): PAD protocol VAP  protocol (if indicated): Yes DVT prophylaxis: PPI Glucose control: Sliding scale insulin protocol Mobility: Bedrest Disposition: Intensive care unit  Goals of Care:  Last date of multidisciplinary goals of care discussion: 01/06/2021 via phone Family and staff present: 01/06/2021 spoke with wife and daughter made them aware of impending intubation.Updated in person 2/17 per MD Summary of discussion: Agree they want intubation if necessary. Follow up goals of care discussion due:2/24 Code Status: Full code  Wife, Santiago Glad, called for update 2/22, all questions answered at this time. Wife anxiously awaiting to see how today's surgery goes.   Labs   CBC: Recent Labs  Lab 01/05/21 0438 01/06/21 0258 01/07/21 0522 01/08/21 0242 01/05/2021 0535 01/10/21 0323 01/10/21 0908 01/10/21 1126 01/10/21 1155 12/27/2020 0354  WBC 26.1* 31.8* 36.8* 33.8* 44.3* 41.6*  --   --   --  51.6*  NEUTROABS 21.9* 27.7*  --  30.0*  --  39.9*  --   --   --   --   HGB 15.1 14.4 14.2 12.5* 12.7* 12.2* 13.6 12.9* 11.7* 11.9*  HCT 46.4 44.3 44.3 39.2 40.1 37.7* 40.0 38.0* 38.7* 39.2  MCV 78.0* 78.0* 80.4 80.8 80.7 80.7  --   --   --  83.6  PLT 290 220 139* 225 265 247  --   --   --  956    Basic Metabolic Panel: Recent Labs  Lab 01/06/21 0258 01/07/21 0522 01/08/21 0242 01/08/21 1134 12/31/2020 0315 01/10/21 0323 01/10/21 0908 01/10/21 1126 01/10/21 1155 01/10/21 1413 01/02/2021 0354  NA 142   < > 142   < > 144 145 146* 145 142 147* 143  K 4.3   < > 5.2*   < > 4.2 5.3* 5.2* 5.4* 4.9 5.6* 5.5*  CL 108   < > 109   < > 110 114*  --   --  113* 115* 113*  CO2 26   < > 20*   < > 20* 20*  --   --  19* 22 20*  GLUCOSE 268*   < > 323*   < > 255* 282*  --   --  385* 253* 391*  BUN 41*   < > 92*   < > 105* 121*  --   --  113* 128* 138*  CREATININE 1.41*   < > 3.10*   < > 2.98* 2.75*  --   --  2.36* 2.55* 2.34*  CALCIUM 8.0*   < > 7.6*   < > 7.6* 7.3*  --   --  >15.0* 7.5* 7.4*  MG 2.7*  --  3.3*  --   --  3.6*  --    --   --   --  3.5*  PHOS 2.3*  --  3.7  --   --  6.5*  --   --   --   --  6.7*   < > = values in this interval not displayed.   GFR: Estimated Creatinine Clearance: 35.6 mL/min (A) (by C-G formula based on SCr of  2.34 mg/dL (H)). Recent Labs  Lab 01/06/21 0916 01/07/21 0522 01/08/21 0242 01/08/21 0908 01/08/21 1445 01/08/21 2052 01/10/2021 0300 12/22/2020 0535 12/23/2020 1115 01/10/21 0323 12/23/2020 0354  PROCALCITON 0.43  --   --   --   --   --   --   --   --   --   --   WBC  --    < > 33.8*  --   --   --   --  44.3*  --  41.6* 51.6*  LATICACIDVEN  --    < > 3.2*   < > 2.9* 3.0* 3.6*  --  2.2*  --   --    < > = values in this interval not displayed.    Liver Function Tests: Recent Labs  Lab 01/05/21 0438 01/06/21 0258 12/21/2020 0315 01/10/21 0323  AST 78* 78* 28 33  ALT 62* 73* 43 40  ALKPHOS 118 164* 138* 106  BILITOT 1.0 1.6* 1.1 0.9  PROT 6.2* 6.0* 5.4* 4.6*  ALBUMIN 3.3* 3.1* 2.7* 2.4*   No results for input(s): LIPASE, AMYLASE in the last 168 hours. No results for input(s): AMMONIA in the last 168 hours.  ABG    Component Value Date/Time   PHART 7.341 (L) 01/10/2021 1126   PCO2ART 39.3 01/10/2021 1126   PO2ART 81 (L) 01/10/2021 1126   HCO3 21.3 01/10/2021 1126   TCO2 22 01/10/2021 1126   ACIDBASEDEF 4.0 (H) 01/10/2021 1126   O2SAT 95.0 01/10/2021 1126     Coagulation Profile: No results for input(s): INR, PROTIME in the last 168 hours.  Cardiac Enzymes: No results for input(s): CKTOTAL, CKMB, CKMBINDEX, TROPONINI in the last 168 hours.  HbA1C: Hgb A1c MFr Bld  Date/Time Value Ref Range Status  11/04/2020 02:29 PM 6.7 (H) 4.6 - 6.5 % Final    Comment:    Glycemic Control Guidelines for People with Diabetes:Non Diabetic:  <6%Goal of Therapy: <7%Additional Action Suggested:  >8%   08/05/2020 01:45 PM 6.5 (H) <5.7 % of total Hgb Final    Comment:    For someone without known diabetes, a hemoglobin A1c value of 6.5% or greater indicates that they may  have  diabetes and this should be confirmed with a follow-up  test. . For someone with known diabetes, a value <7% indicates  that their diabetes is well controlled and a value  greater than or equal to 7% indicates suboptimal  control. A1c targets should be individualized based on  duration of diabetes, age, comorbid conditions, and  other considerations. . Currently, no consensus exists regarding use of hemoglobin A1c for diagnosis of diabetes for children. .     CBG: Recent Labs  Lab 01/12/2021 1923 01/02/2021 2228 01/10/21 0333 01/10/21 0953 01/10/21 1154  GLUCAP 246* 226* 239* 239* 225*      Critical care time: 40 min     Kennieth Rad, ACNP [facility] 12/31/2020, 9:02 AM

## 2021-01-11 NOTE — Progress Notes (Signed)
Patient did not come to short stay. Hand off documented in error.

## 2021-01-11 NOTE — Anesthesia Preprocedure Evaluation (Signed)
Anesthesia Evaluation  Patient identified by MRN, date of birth, ID band Patient unresponsive    Reviewed: Allergy & Precautions, H&P , Patient's Chart, lab work & pertinent test results, Unable to perform ROS - Chart review only  Airway Mallampati: Intubated  TM Distance: >3 FB Neck ROM: Full    Dental no notable dental hx.    Pulmonary pneumonia, unresolved, former smoker,  covid pneumonia, Intubated   Pulmonary exam normal breath sounds clear to auscultation (-) decreased breath sounds      Cardiovascular hypertension, Normal cardiovascular exam Rhythm:Regular Rate:Normal     Neuro/Psych negative neurological ROS  negative psych ROS   GI/Hepatic Neg liver ROS, GERD  ,Ischemic bowel    Endo/Other  diabetes, Type 2  Renal/GU Renal InsufficiencyRenal disease  negative genitourinary   Musculoskeletal negative musculoskeletal ROS (+)   Abdominal   Peds negative pediatric ROS (+)  Hematology negative hematology ROS (+)   Anesthesia Other Findings   Reproductive/Obstetrics negative OB ROS                             Anesthesia Physical  Anesthesia Plan  ASA: IV  Anesthesia Plan: General   Post-op Pain Management:    Induction: Inhalational  PONV Risk Score and Plan: 0  Airway Management Planned: Oral ETT  Additional Equipment:   Intra-op Plan:   Post-operative Plan: Post-operative intubation/ventilation  Informed Consent: I have reviewed the patients History and Physical, chart, labs and discussed the procedure including the risks, benefits and alternatives for the proposed anesthesia with the patient or authorized representative who has indicated his/her understanding and acceptance.     Dental advisory given  Plan Discussed with: CRNA and Surgeon  Anesthesia Plan Comments:         Anesthesia Quick Evaluation

## 2021-01-11 NOTE — Progress Notes (Signed)
Pre Procedure note for inpatients:   Thomas Chung has been scheduled for Procedure(s) with comments: EXPLORATORY LAPAROTOMY (N/A) - ROOM 1 STARTING AT 09:00AM FOR 120 MIN POSSIBLE BOWEL RESECTION (N/A) POSSIBLE ILEOSTOMY (N/A) today. The various methods of treatment have been discussed with the patient. After consideration of the risks, benefits and treatment options the patient has consented to the planned procedure.   The patient has been seen and labs reviewed. Increased leukocytosis concerning for persistent ischemia. Discussed these changes with his wife.  Recent labs:  Lab Results  Component Value Date   WBC 51.6 (HH) 12/27/2020   HGB 11.9 (L) 12/23/2020   HCT 39.2 01/12/2021   PLT 212 12/22/2020   GLUCOSE 391 (H) 01/04/2021   CHOL 117 02/03/2020   TRIG 567 (H) 01/10/2021   HDL 34.20 (L) 02/03/2020   LDLCALC 50 02/03/2020   ALT 40 01/10/2021   AST 33 01/10/2021   NA 143 01/17/2021   K 5.5 (H) 12/24/2020   CL 113 (H) 01/05/2021   CREATININE 2.34 (H) 12/21/2020   BUN 138 (H) 12/21/2020   CO2 20 (L) 01/03/2021   TSH 1.46 02/03/2020   PSA 0.09 (L) 02/03/2020   HGBA1C 6.7 (H) 11/04/2020   MICROALBUR 1.3 02/03/2020    Mickeal Skinner, MD 01/06/2021 7:53 AM

## 2021-01-11 NOTE — Consult Note (Addendum)
[facility]    Date of Admission:  12/21/2020     Reason for Consult: severe sepsis, clostridium species bacteremia, small bowel ischemia    Referring Provider: Tamala Julian    Lines/device:  2/20-c rue triple lumen picc 2/20-c wound vac abdomen  Abx: 2/18-c vanc 2/18-c piptazo       2/12-12 remdesivir  Other: 2/21-c tpn 2/13-c methylpred as of 2/21 40 mg daily  2/16 tocilizumab 2/12 dexamethasone   Assessment: Clostridium cadaveris bacteremia Leukemoid reaction Severe sepsis without septic shock Small bowel ischemia aki On TPN Severe covid infection/ards Dm2/htn/hlp  80 yo male admitted 2/12 for severe covid infection in setting of almost 2 weeks progressive weakness, cough, subjective fever, diarrhea, course complicated by ARDS, nosocomial sepsis with clostridium cadaveris bacteremia in setting small bowel ischemia  He received steroid/remdesivir/toci for covid. Despite this developed ards and intubated on 2/17.   He developed sepsis with leukemoid reaction with bacteremia found on 2/18 clostridium cadaveris. Started on bsABX 2/18. Due to worsening leukemoid reaction and abd ct showing bowel pneumatosis but no intraabdominal free air, underwent exlap with small bowel resection on 2/20. Source bacteremia likely gut  ---------- 2/22 assessment Wbc count still rising. No longer febrile. Not on pressors. Suspect ongoing bowel ischemia He is started on tpn on 2/21 There is plan by surgery for repeat exlap today cxr today stable pulmonary changes. I suspect covid/ards rather than nosocomial pneumonia.   Plan: 1. As blood cx without mrsa, and mrsa nares also negative, we can stop vancomycin 2. Continue piptazo for clostridial sepsis; duration depends on clinical improvement/further surgical finding 3. No need to repeat bcx. Suspect ongoing ischemia with leukemoid reaction. Hemodynamics stable otherwise. 4. Can repeat bcx if worsening  hemodynamics or ongoing fever 5. covid management per primary team  Principal Problem:   Acute respiratory disease due to COVID-19 virus Active Problems:   Type II diabetes mellitus with manifestations (HCC)   Hyperlipidemia with target LDL less than 100   Essential hypertension, benign   Exocrine pancreatic insufficiency   Pneumonia due to COVID-19 virus   Elevated serum creatinine   Scheduled Meds: . chlorhexidine gluconate (MEDLINE KIT)  15 mL Mouth Rinse BID  . Chlorhexidine Gluconate Cloth  6 each Topical Daily  . enoxaparin (LOVENOX) injection  30 mg Subcutaneous Q24H  . lidocaine  1 application Other Once  . mouth rinse  15 mL Mouth Rinse 10 times per day  . methylPREDNISolone (SOLU-MEDROL) injection  40 mg Intravenous Daily  . mupirocin ointment  1 application Nasal BID  . pantoprazole (PROTONIX) IV  40 mg Intravenous Q24H  . sodium chloride flush  10-40 mL Intracatheter Q12H  . sodium zirconium cyclosilicate  10 g Per Tube Once   Continuous Infusions: . dextrose 75 mL/hr at 12/22/2020 0900  . fentaNYL infusion INTRAVENOUS 175 mcg/hr (12/21/2020 0900)  . insulin    . levETIRAcetam Stopped (12/24/2020 0007)  . piperacillin-tazobactam (ZOSYN)  IV 12.5 mL/hr at 01/14/2021 0900  . TPN ADULT (ION) 40 mL/hr at 12/25/2020 0900   PRN Meds:.acetaminophen, dextrose, fentaNYL, fentaNYL (SUBLIMAZE) injection, hydrALAZINE, ipratropium-albuterol, labetalol, midazolam, sodium chloride flush  HPI: Thomas Chung is a 80 y.o. male dm2/htn/hlp dx'ed covid on 2/02 with progressive fatigue/weakness, cough, diarrhea, dyspnea, admitted 5/36, course complicated by worsening hypoxemic respiratory distress and sepsis/clostridium sepsis in settign bowel ischemia  Patient developed cough, diarrhea, dyspnea, fatigue 1/30. He was tested outside and positive for covid on 2/02. No  n/v/abd pain  He presented to the ED on 2/12 for worsening sx  Hospital course: Admission 2/12 afebrile, hypoxemic requiring  5L oxygen via [place] No leukocytosis Cr 1.3 crp 5 Lactate 1.4 cxr worsening bilateral opacities c/w covid Started on remdesivir/methylpred 2/14 overall team feels improvement  2/15 desatted requiring hfnc; complains of abdominal pain 2/16 abdominal xray suggest sbo. actemra added; 25 liters hfnc required. Wbc 27 2/17 fever. o2 requirement escalated. Intubated. 2/18 acute kidney injury; cr 1.4-->2.6. wbc 37. bcx obtained (ultimately grew clostridium cadaveris). Empiric bsABX vanc/piptazo. Stable for abd ct --> suggestive of enteritis/pneumatosis 2/19 cr 3.2 2/20 wbc continues to rise 44. s/p diagnostic ex-lap, partial small bowel resection for probable small bowel ischemia, wound vac application 1/49 abnormal motor movement noted. Brain mri nonspecific/incidental right thalamic signal abnormality. Neurology consulted suspect metabolic encephal and probable seizure  2/22 wbc up to 52. No further fever. Cr improved to 2.3. Planned repeat exlap. cxr no interim changes of the bilateral opacities. Patient remains sedated/decreased mentation  Of note, patient has been on lovenox prophylaxis dosing since admission  Review of Systems: ROS Unable to obtain given mentation/intubation  Past Medical History:  Diagnosis Date  . Depression   . Fatty liver   . Glucose intolerance (impaired glucose tolerance)   . HOH (hard of hearing)    bilateral hearing aides  . Hx of colonic polyps   . Hyperlipidemia   . Hypertension   . Neuromuscular disorder ([place])    neuropathy feet    Social History   Tobacco Use  . Smoking status: Former Smoker    Quit date: 09/21/1969    Years since quitting: 51.3  . Smokeless tobacco: Never Used  Vaping Use  . Vaping Use: Never used  Substance Use Topics  . Alcohol use: Yes    Alcohol/week: 6.0 standard drinks    Types: 6 Cans of beer per week  . Drug use: No    Family History  Problem Relation Age of Onset  . Arrhythmia Mother   . Heart attack Mother   .  Heart disease Mother   . Heart failure Father   . Diabetes Father   . Coronary artery disease Other        1st degree relative<60  . Alcohol abuse Other   . Diabetes Other   . Stomach cancer Sister   . Cancer Neg Hx   . Stroke Neg Hx   . Hyperlipidemia Neg Hx   . Hypertension Neg Hx   . Kidney disease Neg Hx    Allergies  Allergen Reactions  . Amlodipine Swelling    Swelling in legs; no shortness of breath or tongue swelling.  Marland Kitchen Prevnar [Pneumococcal 13-Val Conj Vacc]     Joint pain, swelling    OBJECTIVE: Blood pressure 138/70, pulse 93, temperature (!) 97.2 F (36.2 C), resp. rate 17, height _0  (1.93 m), weight 115.4 kg, SpO2 92 %.  Physical Exam Intubated, sedated Vent 50% fio2, 5 peep Heent: normocephalic; per; conj clear; ett intact Neck supplec cv rrr no mrg Lungs clear on vent abd slightly distended; wound vac in place and functioning Ext anasarca Skin mottling of the feet worse on left; unable to feel DP pulse bilateral feet which are cold Neuro deferred Psych deferred msk no obvious knees/elbows joint swelling/redness  Lab Results Lab Results  Component Value Date   WBC 51.6 (HH) 12/25/2020   HGB 11.9 (L) 01/06/2021   HCT 39.2 12/25/2020   MCV 83.6 01/08/2021   PLT  212 12/22/2020    Lab Results  Component Value Date   CREATININE 2.34 (H) 01/14/2021   BUN 138 (H) 01/04/2021   NA 143 12/29/2020   K 5.5 (H) 01/08/2021   CL 113 (H) 12/22/2020   CO2 20 (L) 01/07/2021    Lab Results  Component Value Date   ALT 40 01/10/2021   AST 33 01/10/2021   ALKPHOS 106 01/10/2021   BILITOT 0.9 01/10/2021     Microbiology: Recent Results (from the past 240 hour(s))  Resp Panel by RT-PCR (Flu A&B, Covid) Nasopharyngeal Swab     Status: Abnormal   Collection Time: 12/31/2020  4:55 PM   Specimen: Nasopharyngeal Swab; Nasopharyngeal(NP) swabs in vial transport medium  Result Value Ref Range Status   SARS Coronavirus 2 by RT PCR POSITIVE (A) NEGATIVE Final     Comment: RESULT CALLED TO, READ BACK BY AND VERIFIED WITH: SONIA 12/24/2020 _0  BY SEEL,M (NOTE) SARS-CoV-2 target nucleic acids are DETECTED.  The SARS-CoV-2 RNA is generally detectable in upper respiratory specimens during the acute phase of infection. Positive results are indicative of the presence of the identified virus, but do not rule out bacterial infection or co-infection with other pathogens not detected by the test. Clinical correlation with patient history and other diagnostic information is necessary to determine patient infection status. The expected result is Negative.  Fact Sheet for Patients: EntrepreneurPulse.com.au  Fact Sheet for Healthcare Providers: IncredibleEmployment.be  This test is not yet approved or cleared by the [place] FDA and  has been authorized for detection and/or diagnosis of SARS-CoV-2 by FDA under an Emergency Use Authorization (EUA).  This EUA will remain in effect (meaning this test can be [place] ed) for the duration of  the COVID-19 declaration under Section 564(b)(1) of the Act, 21 U.S.C. section 360bbb-3(b)(1), unless the authorization is terminated or revoked sooner.     Influenza A by PCR NEGATIVE NEGATIVE Final   Influenza B by PCR NEGATIVE NEGATIVE Final    Comment: (NOTE) The Xpert Xpress SARS-CoV-2/FLU/RSV plus assay is intended as an aid in the diagnosis of influenza from Nasopharyngeal swab specimens and should not be used as a sole basis for treatment. Nasal washings and aspirates are unacceptable for Xpert Xpress SARS-CoV-2/FLU/RSV testing.  Fact Sheet for Patients: EntrepreneurPulse.com.au  Fact Sheet for Healthcare Providers: IncredibleEmployment.be  This test is not yet approved or cleared by the [place] FDA and has been authorized for detection and/or diagnosis of SARS-CoV-2 by FDA under an Emergency Use Authorization (EUA). This EUA  will remain in effect (meaning this test can be used) for the duration of the COVID-19 declaration under Section 564(b)(1) of the Act, 21 U.S.C. section 360bbb-3(b)(1), unless the authorization is terminated or revoked.  Performed at [facility], [place] [address], [place], [place] [address]   Blood Culture (routine x 2)     Status: None   Collection Time: 12/22/2020  5:11 PM   Specimen: BLOOD  Result Value Ref Range Status   Specimen Description   Final    BLOOD LEFT ANTECUBITAL Performed at [facility] [address] [place], [place] [address]    Special Requests   Final    BOTTLES DRAWN AEROBIC AND ANAEROBIC Blood Culture adequate volume Performed at [facility] [address], [place], [place] [address]    Culture   Final    NO GROWTH 5 DAYS Performed at [facility], [place] [address], [place], [place] [address]  Report Status 01/06/2021 FINAL  Final  Blood Culture (routine x 2)     Status: None   Collection Time: 01/05/2021  5:30 PM   Specimen: BLOOD  Result Value Ref Range Status   Specimen Description   Final    BLOOD RIGHT ANTECUBITAL Performed at [facility] [address] [place], [place] [address]    Special Requests   Final    BOTTLES DRAWN AEROBIC AND ANAEROBIC Blood Culture adequate volume Performed at [facility] [address], [place], [place] [address]    Culture   Final    NO GROWTH 5 DAYS Performed at [facility], [place] [address] [place], [place] [address]    Report Status 01/06/2021 FINAL  Final  MRSA PCR Screening     Status: None   Collection Time: 01/05/21 12:06 PM   Specimen: Nasal Mucosa; Nasopharyngeal  Result Value Ref Range Status   MRSA by PCR NEGATIVE NEGATIVE Final    Comment:        The GeneXpert MRSA Assay (FDA approved for NASAL specimens only), is one component of a comprehensive MRSA colonization surveillance program.  It is not intended to diagnose MRSA infection nor to guide or monitor treatment for MRSA infections. Performed at [facility], [place] [address], [place], [place] [address]   Culture, Respiratory w Gram Stain     Status: None   Collection Time: 01/06/21 10:49 AM   Specimen: Tracheal Aspirate; Respiratory  Result Value Ref Range Status   Specimen Description   Final    TRACHEAL ASPIRATE Performed at [facility] [address], [place], [place] [address]    Special Requests   Final    NONE Performed at [facility], [place] [address] [place], [place] [address]    Gram Stain   Final    MODERATE WBC PRESENT, PREDOMINANTLY PMN MODERATE GRAM NEGATIVE RODS FEW GRAM POSITIVE COCCI RARE GRAM POSITIVE RODS Performed at [facility], [place] [address], [place], [place] [address]    Culture ABUNDANT KLEBSIELLA OXYTOCA  Final   Report Status 01/10/2021 FINAL  Final   Organism ID, Bacteria KLEBSIELLA OXYTOCA  Final      Susceptibility   Klebsiella oxytoca - MIC*    AMPICILLIN >=32 RESISTANT Resistant     CEFAZOLIN 16 SENSITIVE Sensitive     CEFEPIME <=0.12 SENSITIVE Sensitive     CEFTAZIDIME <=1 SENSITIVE Sensitive     CEFTRIAXONE <=0.25 SENSITIVE Sensitive     CIPROFLOXACIN <=0.25 SENSITIVE Sensitive     GENTAMICIN <=1 SENSITIVE Sensitive     IMIPENEM <=0.25 SENSITIVE Sensitive     TRIMETH/SULFA <=20 SENSITIVE Sensitive     AMPICILLIN/SULBACTAM 8 SENSITIVE Sensitive     PIP/TAZO <=4 SENSITIVE Sensitive     * ABUNDANT KLEBSIELLA OXYTOCA  Culture, blood (Routine X 2) w Reflex to ID Panel     Status: Abnormal (Preliminary result)   Collection Time: 01/07/21  8:01 AM   Specimen: BLOOD  Result Value Ref Range Status   Specimen Description   Final    BLOOD LEFT ANTECUBITAL Performed at [facility] [address] [place], [place] [address]    Special Requests   Final    BOTTLES DRAWN AEROBIC AND ANAEROBIC  Blood Culture adequate volume Performed at [facility] [address], [place], [place] [address]    Culture  Setup Time   Final    IN BOTH AEROBIC AND  ANAEROBIC BOTTLES GRAM VARIABLE ROD CRITICAL RESULT CALLED TO, READ BACK BY AND VERIFIED WITH: SCOTT CHRISTY PHARMD _0  01/08/21 EB Performed at [facility] [address] [place], [place] [address]    Culture CLOSTRIDIUM CADAVERIS (A)  Final   Report Status PENDING  Incomplete  Culture, blood (Routine X 2) w Reflex to ID Panel     Status: None (Preliminary result)   Collection Time: 01/07/21  8:54 AM   Specimen: BLOOD LEFT HAND  Result Value Ref Range Status   Specimen Description   Final    BLOOD LEFT HAND Performed at [facility] [address] [place], [place] [address]    Special Requests   Final    BOTTLES DRAWN AEROBIC ONLY Blood Culture results may not be optimal due to an inadequate volume of blood received in culture bottles Performed at [facility] [address], [place], [place] [address]    Culture   Final    NO GROWTH 4 DAYS Performed at [facility], [place] [address], [place], [place] [address]    Report Status PENDING  Incomplete  Surgical PCR screen     Status: None   Collection Time: 01/04/2021  3:54 AM   Specimen: Nasal Mucosa; Nasal Swab  Result Value Ref Range Status   MRSA, PCR NEGATIVE NEGATIVE Final   Staphylococcus aureus NEGATIVE NEGATIVE Final    Comment: (NOTE) The Xpert SA Assay (FDA approved for NASAL specimens in patients 30 years of age and older), is one component of a comprehensive surveillance program. It is not intended to diagnose infection nor to guide or monitor treatment. Performed at [facility], [place] [address], [place], [place] [address]    Imaging: 2/22 cxr 1. Right PICC line noted with tip over cavoatrial junction. Endotracheal tube and NG tube in stable position. 2. Low lung volumes. Diffuse  bilateral interstitial infiltrates again noted without interim change  2/21 brain mri 1. Small foci of abnormal diffusion restriction in the right thalamus, possibly post-ictal or subacute ischemia. 2. Areas of magnetic susceptibility effect in the basal ganglia and thalami likely indicate petechial blood superimposed on chronic mineralization. Additionally, susceptibility artifacts can create false positives on diffusion-weighted imaging  2/18 abd pelv ct 1. Segmental enteritis in the right lower quadrant with findings concerning for possible early ischemia. Clinical correlation is recommended. No bowel obstruction. 2. Cholelithiasis. 3. Splenomegaly with findings of possible areas of splenic hypoperfusion/early infarct. Evaluation is very limited on noncontrast CT. 4. Diffuse bilateral patchy airspace opacities most consistent with multifocal pneumonia, likely viral or atypical in etiology including COVID-19. 5. Aortic Atherosclerosis     Jabier Mutton, [facility] [place] [phone] pager    01/08/2021, 9:49 AM

## 2021-01-11 NOTE — Plan of Care (Signed)
Goal: Cardiovascular complication will be avoided Outcome: Progressing    Problem: Nutrition: Goal: Adequate nutrition will be maintained Outcome: Progressing   Problem: Elimination: Goal: Will not experience complications related to bowel motility Outcome: Progressing Goal: Will not experience complications related to urinary retention Outcome: Progressing   Problem: Pain Managment: Goal: General experience of comfort will improve Outcome: Progressing   Problem: Safety: Goal: Ability to remain free from injury will improve Outcome: Progressing   Problem: Skin Integrity: Goal: Risk for impaired skin integrity will decrease Outcome: Progressing

## 2021-01-11 NOTE — Transfer of Care (Signed)
Immediate Anesthesia Transfer of Care Note  Patient: Thomas Chung  Procedure(s) Performed: EXPLORATORY LAPAROTOMY SMALL BOWEL RE-ANASTOMOSIS X2 (N/A Abdomen)  Patient Location: ICU  Anesthesia Type:General  Level of Consciousness: Patient remains intubated per anesthesia plan  Airway & Oxygen Therapy: Patient remains intubated per anesthesia plan and Patient placed on Ventilator (see vital sign flow sheet for setting)  Post-op Assessment: Report given to RN and Post -op Vital signs reviewed and stable  Post vital signs: Reviewed and stable  Last Vitals:  Vitals Value Taken Time  BP    Temp    Pulse    Resp    SpO2      Last Pain:  Vitals:   01/03/2021 1200  TempSrc: Esophageal  PainSc:       Patients Stated Pain Goal:  (UTA) (88/41/66 0630)  Complications: No complications documented.

## 2021-01-12 ENCOUNTER — Encounter (HOSPITAL_COMMUNITY): Payer: Self-pay | Admitting: General Surgery

## 2021-01-12 DIAGNOSIS — I998 Other disorder of circulatory system: Secondary | ICD-10-CM

## 2021-01-12 DIAGNOSIS — J069 Acute upper respiratory infection, unspecified: Secondary | ICD-10-CM | POA: Diagnosis not present

## 2021-01-12 DIAGNOSIS — J80 Acute respiratory distress syndrome: Secondary | ICD-10-CM | POA: Diagnosis not present

## 2021-01-12 DIAGNOSIS — J150 Pneumonia due to Klebsiella pneumoniae: Secondary | ICD-10-CM

## 2021-01-12 DIAGNOSIS — Z8616 Personal history of COVID-19: Secondary | ICD-10-CM

## 2021-01-12 DIAGNOSIS — D72829 Elevated white blood cell count, unspecified: Secondary | ICD-10-CM | POA: Diagnosis not present

## 2021-01-12 DIAGNOSIS — A414 Sepsis due to anaerobes: Secondary | ICD-10-CM | POA: Diagnosis not present

## 2021-01-12 DIAGNOSIS — N179 Acute kidney failure, unspecified: Secondary | ICD-10-CM | POA: Diagnosis not present

## 2021-01-12 DIAGNOSIS — U071 COVID-19: Secondary | ICD-10-CM | POA: Diagnosis not present

## 2021-01-12 LAB — CULTURE, BLOOD (ROUTINE X 2): Culture: NO GROWTH

## 2021-01-12 LAB — MAGNESIUM: Magnesium: 3.9 mg/dL — ABNORMAL HIGH (ref 1.7–2.4)

## 2021-01-12 LAB — CBC
HCT: 35.9 % — ABNORMAL LOW (ref 39.0–52.0)
Hemoglobin: 10.5 g/dL — ABNORMAL LOW (ref 13.0–17.0)
MCH: 25.5 pg — ABNORMAL LOW (ref 26.0–34.0)
MCHC: 29.2 g/dL — ABNORMAL LOW (ref 30.0–36.0)
MCV: 87.1 fL (ref 80.0–100.0)
Platelets: 144 10*3/uL — ABNORMAL LOW (ref 150–400)
RBC: 4.12 MIL/uL — ABNORMAL LOW (ref 4.22–5.81)
RDW: 18 % — ABNORMAL HIGH (ref 11.5–15.5)
WBC: 51.9 10*3/uL (ref 4.0–10.5)
nRBC: 8.9 % — ABNORMAL HIGH (ref 0.0–0.2)

## 2021-01-12 LAB — GLUCOSE, CAPILLARY
Glucose-Capillary: 125 mg/dL — ABNORMAL HIGH (ref 70–99)
Glucose-Capillary: 129 mg/dL — ABNORMAL HIGH (ref 70–99)
Glucose-Capillary: 139 mg/dL — ABNORMAL HIGH (ref 70–99)
Glucose-Capillary: 145 mg/dL — ABNORMAL HIGH (ref 70–99)
Glucose-Capillary: 150 mg/dL — ABNORMAL HIGH (ref 70–99)
Glucose-Capillary: 152 mg/dL — ABNORMAL HIGH (ref 70–99)
Glucose-Capillary: 157 mg/dL — ABNORMAL HIGH (ref 70–99)
Glucose-Capillary: 163 mg/dL — ABNORMAL HIGH (ref 70–99)
Glucose-Capillary: 168 mg/dL — ABNORMAL HIGH (ref 70–99)
Glucose-Capillary: 173 mg/dL — ABNORMAL HIGH (ref 70–99)
Glucose-Capillary: 202 mg/dL — ABNORMAL HIGH (ref 70–99)
Glucose-Capillary: 211 mg/dL — ABNORMAL HIGH (ref 70–99)
Glucose-Capillary: 224 mg/dL — ABNORMAL HIGH (ref 70–99)
Glucose-Capillary: 232 mg/dL — ABNORMAL HIGH (ref 70–99)
Glucose-Capillary: 242 mg/dL — ABNORMAL HIGH (ref 70–99)
Glucose-Capillary: 250 mg/dL — ABNORMAL HIGH (ref 70–99)
Glucose-Capillary: 259 mg/dL — ABNORMAL HIGH (ref 70–99)
Glucose-Capillary: 150 mg/dL — ABNORMAL HIGH (ref 70–99)
Glucose-Capillary: 184 mg/dL — ABNORMAL HIGH (ref 70–99)
Glucose-Capillary: 204 mg/dL — ABNORMAL HIGH (ref 70–99)
Glucose-Capillary: 195 mg/dL — ABNORMAL HIGH (ref 70–99)

## 2021-01-12 LAB — PHOSPHORUS: Phosphorus: 5.5 mg/dL — ABNORMAL HIGH (ref 2.5–4.6)

## 2021-01-12 LAB — BASIC METABOLIC PANEL WITH GFR
Anion gap: 12 (ref 5–15)
BUN: 166 mg/dL — ABNORMAL HIGH (ref 8–23)
CO2: 20 mmol/L — ABNORMAL LOW (ref 22–32)
Calcium: 7.6 mg/dL — ABNORMAL LOW (ref 8.9–10.3)
Chloride: 117 mmol/L — ABNORMAL HIGH (ref 98–111)
Creatinine, Ser: 2.62 mg/dL — ABNORMAL HIGH (ref 0.61–1.24)
GFR, Estimated: 24 mL/min — ABNORMAL LOW (ref >60)
Glucose, Bld: 148 mg/dL — ABNORMAL HIGH (ref 70–99)
Potassium: 6.2 mmol/L — ABNORMAL HIGH (ref 3.5–5.1)
Sodium: 149 mmol/L — ABNORMAL HIGH (ref 135–145)

## 2021-01-12 MED ORDER — PIVOT 1.5 CAL PO LIQD
1000.0000 mL | ORAL | Status: DC
  Administered 2021-01-12: 1000 mL

## 2021-01-12 MED ORDER — TRAVASOL 10 % IV SOLN
INTRAVENOUS | Status: AC
  Filled 2021-01-12: qty 604.8

## 2021-01-12 MED ORDER — LABETALOL HCL 5 MG/ML IV SOLN
10.0000 mg | INTRAVENOUS | Status: DC | PRN
  Administered 2021-01-12: 20 mg via INTRAVENOUS
  Filled 2021-01-12: qty 4

## 2021-01-12 MED ORDER — LABETALOL HCL 5 MG/ML IV SOLN: 20.0000 mg | INTRAVENOUS | Status: DC | PRN

## 2021-01-12 MED ORDER — SODIUM ZIRCONIUM CYCLOSILICATE 10 G PO PACK
10.0000 g | PACK | Freq: Two times a day (BID) | ORAL | Status: AC
  Administered 2021-01-12 – 2021-01-13 (×3): 10 g
  Filled 2021-01-12 (×3): qty 1

## 2021-01-12 MED ORDER — DEXMEDETOMIDINE HCL IN NACL 400 MCG/100ML IV SOLN
0.4000 ug/kg/h | INTRAVENOUS | Status: DC
  Administered 2021-01-12: 0.6 ug/kg/h via INTRAVENOUS
  Filled 2021-01-12: qty 100

## 2021-01-12 NOTE — Anesthesia Postprocedure Evaluation (Signed)
Anesthesia Post Note  Patient: Thomas Chung  Procedure(s) Performed: EXPLORATORY LAPAROTOMY SMALL BOWEL RE-ANASTOMOSIS X2 (N/A Abdomen)     Patient location during evaluation: SICU Anesthesia Type: General Level of consciousness: sedated Pain management: pain level controlled Vital Signs Assessment: post-procedure vital signs reviewed and stable Respiratory status: patient remains intubated per anesthesia plan Cardiovascular status: stable Postop Assessment: no apparent nausea or vomiting Anesthetic complications: no   No complications documented.  Last Vitals:  Vitals:   01/12/21 0700 01/12/21 0755  BP: (!) 157/60 (!) 142/59  Pulse: 96   Resp: 11   Temp: (!) 38.1 C   SpO2: 92%     Last Pain:  Vitals:   01/12/21 0400  TempSrc: Esophageal  PainSc:                  Thomas Chung

## 2021-01-12 NOTE — Progress Notes (Addendum)
Pt placed on previous settings to rest for the night per policy. Pt not tolerating Bivent. Vent adjusted to PC 10/ 14/50%/+5. Pt tolerating well breathing appears to comfortable though pt still unresponsive . Elink MD aware and ok with changes. Vt 750-850 RR 24 Spo2 92%.

## 2021-01-12 NOTE — Progress Notes (Signed)
[facility] for Infectious Disease    Date of Admission:  01/04/2021   Total days of antibiotics 6/piptazo           ID: Thomas Chung is a 80 y.o. male with history of covid dx on 2/2 but worsened with respiratory distress and ileus requiring intubation on 2/7 and treated with ARDS protocol and antiviral therapy. He developed ischemic small bowel requiring small bowel resection x 2 on 9/67 course complicated by clostridial sepsis. He remains intubated, aki, ischemic changes to lower extremities. Critically ill.  Principal Problem:   Acute respiratory disease due to COVID-19 virus Active Problems:   Type II diabetes mellitus with manifestations (HCC)   Hyperlipidemia with target LDL less than 100   Essential hypertension, benign   Exocrine pancreatic insufficiency   Pneumonia due to COVID-19 virus   Elevated serum creatinine   ARDS (adult respiratory distress syndrome) (HCC)   Ischemic bowel disease (HCC)   Bacteremia   Severe sepsis without septic shock (HCC)    Subjective: Afebrile, intubated WBC 51  Brain MRI yesterday small thalamic infarct, possible petechial bleed in thalami and basal ganglia  Medications:  . chlorhexidine gluconate (MEDLINE KIT)  15 mL Mouth Rinse BID  . Chlorhexidine Gluconate Cloth  6 each Topical Daily  . enoxaparin (LOVENOX) injection  30 mg Subcutaneous Q24H  . feeding supplement (PIVOT 1.5 CAL)  1,000 mL Per Tube Q24H  . mouth rinse  15 mL Mouth Rinse 10 times per day  . mupirocin ointment  1 application Nasal BID  . pantoprazole (PROTONIX) IV  40 mg Intravenous Q24H  . sodium chloride flush  10-40 mL Intracatheter Q12H    Objective: Vital signs in last 24 hours: Temp:  [97 F (36.1 C)-100.76 F (38.2 C)] 100.58 F (38.1 C) (02/23 1000) Pulse Rate:  [82-100] 95 (02/23 1000) Resp:  [8-26] 16 (02/23 1000) BP: (127-170)/(56-84) 170/77 (02/23 1128) SpO2:  [91 %-99 %] 91 % (02/23 1130) FiO2 (%):  [50 %] 50 % (02/23 1130) Physical  Exam  Constitutional: He is sedated, intubated. He appears well-developed and well-nourished.  HENT:  Mouth/Throat: OETT in place Cardiovascular: Normal rate, regular rhythm and normal heart sounds. Exam reveals no gallop and no friction rub.  No murmur heard.  Pulmonary/Chest: Effort normal and breath sounds normal. No respiratory distress. He has no wheezes.  Abdominal: Soft. Bowel sounds are decreased. Wound vac in place Ext: cyanotic toes to left foot> right foot, warm extremities Skin: Skin is warm and dry. No rash noted. No erythema.    Lab Results Recent Labs    01/14/2021 0354 01/12/21 0524 01/12/21 0911  WBC 51.6* 51.9*  --   HGB 11.9* 10.5*  --   HCT 39.2 35.9*  --   NA 143  --  149*  K 5.5*  --  6.2*  CL 113*  --  117*  CO2 20*  --  20*  BUN 138*  --  166*  CREATININE 2.34*  --  2.62*   Liver Panel Recent Labs    01/10/21 0323  PROT 4.6*  ALBUMIN 2.4*  AST 33  ALT 40  ALKPHOS 106  BILITOT 0.9   Microbiology:  Studies/Results: MR BRAIN WO CONTRAST  Result Date: 01/10/2021 CLINICAL DATA:  Seizure EXAM: MRI HEAD WITHOUT CONTRAST TECHNIQUE: Multiplanar, multiecho pulse sequences of the brain and surrounding structures were obtained without intravenous contrast. COMPARISON:  None. FINDINGS: Brain: Small foci of abnormal diffusion restriction in the right thalamus. There are  corresponding areas of magnetic susceptibility effect in this region and in the left thalamus. Small amount of hyperintense T2-weighted signal at the posterior limb of the right internal capsule. The midline structures are normal. Generalized volume loss. Vascular: Major flow voids are preserved. Skull and upper cervical spine: Normal calvarium and skull base. Visualized upper cervical spine and soft tissues are normal. Sinuses/Orbits:Fluid in both mastoids. Fluid layering in the nasopharynx. No nasopharyngeal mass. A nasogastric tube is present. Normal orbits. IMPRESSION: 1. Small foci of abnormal  diffusion restriction in the right thalamus, possibly post-ictal or subacute ischemia. 2. Areas of magnetic susceptibility effect in the basal ganglia and thalami likely indicate petechial blood superimposed on chronic mineralization. Additionally, susceptibility artifacts can create false positives on diffusion-weighted imaging. Electronically Signed   By: Ulyses Jarred M.D.   On: 01/10/2021 23:17   DG Chest Port 1 View  Result Date: 12/22/2020 CLINICAL DATA:  Intubation.  COVID positive EXAM: PORTABLE CHEST 1 VIEW COMPARISON:  Chest x-ray 01/08/2021. FINDINGS: Right PICC line noted with tip over cavoatrial junction. Endotracheal tube and NG tube in stable position. Heart size stable. Low lung volumes. Diffuse bilateral interstitial infiltrates are again noted without interim change. No pleural effusions or pneumothorax seen. IMPRESSION: 1. Right PICC line noted with tip over cavoatrial junction. Endotracheal tube and NG tube in stable position. 2. Low lung volumes. Diffuse bilateral interstitial infiltrates again noted without interim change. Electronically Signed   By: Marcello Moores  Register   On: 12/24/2020 07:18     Assessment/Plan: Clostridial sepsis = likely related to bowel infarction. Continue with piptazo  Klebsiella pneumonia = piptazo to also treat GNR  Leukocytosis = still markedly elevated. Will continue to monitor  Signs of limb ischemia = continue to monitor on physical exam to see if any signs of secondary cellulitis  aki = mulitfactorial, BUN continues to climb at 166 today, defer to primary team in management./goals of care discussion with family  Remains critically ill/prognosis is guarded  [facility] for Infectious Diseases Cell: [phone] Pager: [phone]  01/12/2021, 11:36 AM

## 2021-01-12 NOTE — Progress Notes (Signed)
Acute Care Surgery Service Progress Note:    Chief Complaint/Subjective: Sedated on vent post op.  No pressors  Objective: Vital signs in last 24 hours: Temp:  [97 F (36.1 C)-100.58 F (38.1 C)] 100.58 F (38.1 C) (02/23 0700) Pulse Rate:  [82-99] 96 (02/23 0700) Resp:  [8-26] 11 (02/23 0700) BP: (127-166)/(59-84) 142/59 (02/23 0755) SpO2:  [91 %-99 %] 92 % (02/23 0700) FiO2 (%):  [50 %] 50 % (02/23 0700) Last BM Date: 01/03/21  Intake/Output from previous day: 02/22 0701 - 02/23 0700 In: 5380.3 [I.V.:3232.1; IV Piggyback:2148.2] Out: 2090 [Urine:1640; Emesis/NG output:100; Drains:350] Intake/Output this shift: No intake/output data recorded.  PE: Abd: soft, ND, wound vac in place, hypoactive BS, but only about 100cc of NGT output noted.  Unable to assess tenderness secondary to sedation.  Lab Results: CBC  Recent Labs    12/21/2020 0354 01/12/21 0524  WBC 51.6* 51.9*  HGB 11.9* 10.5*  HCT 39.2 35.9*  PLT 212 144*   BMET Recent Labs    01/10/21 1413 12/29/2020 0354  NA 147* 143  K 5.6* 5.5*  CL 115* 113*  CO2 22 20*  GLUCOSE 253* 391*  BUN 128* 138*  CREATININE 2.55* 2.34*  CALCIUM 7.5* 7.4*   LFT Hepatic Function Latest Ref Rng & Units 01/10/2021 01/01/2021 01/06/2021  Total Protein 6.5 - 8.1 g/dL 4.6(L) 5.4(L) 6.0(L)  Albumin 3.5 - 5.0 g/dL 2.4(L) 2.7(L) 3.1(L)  AST 15 - 41 U/L 33 28 78(H)  ALT 0 - 44 U/L 40 43 73(H)  Alk Phosphatase 38 - 126 U/L 106 138(H) 164(H)  Total Bilirubin 0.3 - 1.2 mg/dL 0.9 1.1 1.6(H)  Bilirubin, Direct 0.0 - 0.3 mg/dL - - -   PT/INR No results for input(s): LABPROT, INR in the last 72 hours. ABG Recent Labs    01/10/21 0908 01/10/21 1126  PHART 7.316* 7.341*  HCO3 21.4 21.3    Studies/Results:  Anti-infectives: Anti-infectives (From admission, onward)   Start     Dose/Rate Route Frequency Ordered Stop   12/27/2020 0800  vancomycin (VANCOREADY) IVPB 1500 mg/300 mL  Status:  Discontinued        1,500 mg 150 mL/hr  over 120 Minutes Intravenous Every 48 hours 01/04/2021 0702 01/10/21 1421   01/07/21 2200  piperacillin-tazobactam (ZOSYN) IVPB 3.375 g  Status:  Discontinued       Note to Pharmacy: Pharmacy may modify dose for renal dysfunction. Pharmacy to manage and monitor antibiotic administration.   3.375 g 100 mL/hr over 30 Minutes Intravenous Every 8 hours 01/07/21 2014 01/07/21 2017   01/07/21 2200  piperacillin-tazobactam (ZOSYN) IVPB 3.375 g        3.375 g 12.5 mL/hr over 240 Minutes Intravenous Every 8 hours 01/07/21 2018     01/07/21 1030  vancomycin (VANCOREADY) IVPB 2000 mg/400 mL        2,000 mg 200 mL/hr over 120 Minutes Intravenous  Once 01/07/21 0929 01/07/21 1327   01/07/21 1000  ceFEPIme (MAXIPIME) 2 g in sodium chloride 0.9 % 100 mL IVPB  Status:  Discontinued        2 g 200 mL/hr over 30 Minutes Intravenous Every 12 hours 01/07/21 0928 01/07/21 2010   01/07/21 0930  vancomycin variable dose per unstable renal function (pharmacist dosing)  Status:  Discontinued         Does not apply See admin instructions 01/07/21 0930 01/09/21 0814   01/02/21 1000  remdesivir 100 mg in sodium chloride 0.9 % 100 mL IVPB       "  Followed by" Linked Group Details   100 mg 200 mL/hr over 30 Minutes Intravenous Daily 01/11/2021 1845 01/05/21 0921   12/26/2020 2000  remdesivir 200 mg in sodium chloride 0.9% 250 mL IVPB       "Followed by" Linked Group Details   200 mg 580 mL/hr over 30 Minutes Intravenous Once 01/03/2021 1845 01/11/2021 2017      Medications: Scheduled Meds: . chlorhexidine gluconate (MEDLINE KIT)  15 mL Mouth Rinse BID  . Chlorhexidine Gluconate Cloth  6 each Topical Daily  . enoxaparin (LOVENOX) injection  30 mg Subcutaneous Q24H  . mouth rinse  15 mL Mouth Rinse 10 times per day  . methylPREDNISolone (SOLU-MEDROL) injection  40 mg Intravenous Daily  . mupirocin ointment  1 application Nasal BID  . pantoprazole (PROTONIX) IV  40 mg Intravenous Q24H  . sodium chloride flush  10-40 mL  Intracatheter Q12H   Continuous Infusions: . dexmedetomidine (PRECEDEX) IV infusion    . fentaNYL infusion INTRAVENOUS 175 mcg/hr (01/12/21 0700)  . insulin 1.3 mL/hr at 01/12/21 0700  . lactated ringers 75 mL/hr at 01/12/21 0700  . levETIRAcetam 430 mL/hr at 01/12/21 0400  . piperacillin-tazobactam (ZOSYN)  IV 12.5 mL/hr at 01/12/21 0700  . TPN ADULT (ION) 40 mL/hr at 01/12/21 0700   PRN Meds:.acetaminophen, dextrose, fentaNYL, fentaNYL (SUBLIMAZE) injection, hydrALAZINE, ipratropium-albuterol, labetalol, midazolam, sodium chloride flush  Assessment/Plan: Patient Active Problem List   Diagnosis Date Noted  . ARDS (adult respiratory distress syndrome) ([place])   . Ischemic bowel disease ([place])   . Bacteremia   . Severe sepsis without septic shock ([place])   . Acute respiratory disease due to COVID-19 virus 01/02/2021  . Elevated serum creatinine 01/02/2021  . Pneumonia due to COVID-19 virus 12/31/2020  . COVID-19 12/27/2020  . Gout 08/05/2020  . Need for hepatitis C screening test 08/05/2020  . Onychomycosis of multiple toenails with type 2 diabetes mellitus ([place]) 08/06/2019  . 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 B12 deficiency 10/31/2018  . Thrombocytosis 10/29/2018  . GERD without esophagitis 01/22/2018  . Asymptomatic gallstones 09/09/2015  . Routine general medical examination at a health care facility 02/10/2015  . Prostate nodule with urinary obstruction 07/17/2011  . Type II diabetes mellitus with manifestations ([place]) 08/05/2009  . Diabetic neuropathy, painful ([place]) 07/06/2009  . Hyperlipidemia with target LDL less than 100 04/22/2009  . Essential hypertension, benign 04/22/2009  . Erectile dysfunction associated with type 2 diabetes mellitus ([place]) 04/21/2009   COVID pneumonia withVDRF with likely HCAP as well -intubated, per CCM -solumedrol/remdexivir/tocilizumab - on zosyn - WBC 10.9>26.1>31.8>36.8>33>44>41 >51 Worsening renal failure-2.3 yesterday,  pending today Peripheral vascular compromise- hypoperfusion noted  Pancreaticinsufficiency- On Creon at home, currently on hold Type 2 diabetes Hypertension Hyperlipidemia Gout Hx B12 deficiency 20 pack yr hx of tobacco use, none since age 62  POD 92, s/p ex lap with SBR x2 for ischemic bowel, open abdomen, Dr. Redmond Pulling 2/20, POD 1, s/p reconnection and abdominal closure, Dr. Kieth Brightly 2/22 -patient returned to continuity yesterday.  NGT with minimal output -can start low rate 10-20cc/hr trickle TFs today -no further ischemia noted yesterday in OR.  WBC elevation likely not secondary to intra-abdominal source at this point. -change wound VAC on Friday to get on a MWF schedule.  FEN: IVFs/TNA/NGT VTE: Lovenox ID: [email or web address]  Henreitta Cea , [facility] 01/10/2021, 8:14 AM  Please see Amion for pager number during day hours 7:00am-4:30pm or 7:00am -11:30am on weekends

## 2021-01-12 NOTE — Progress Notes (Signed)
Nutrition Follow-up  DOCUMENTATION CODES:   Obesity unspecified  INTERVENTION:   TPN to meet 100% of needs until enteral tolerance is established   Trickle tube feeding:  -Pivot 1.5 @ 10 ml/hr via NGT -Increase to goal per surgery to 75 ml/hr (1800 ml)  At goal TF provides: 2700 kcals, 169 grams protein, 1350 ml free water.   Recommend Cortrak placement if unable to extubate/advance diet  NUTRITION DIAGNOSIS:   Increased nutrient needs related to acute illness,catabolic illness (KCLEX-51 infection) as evidenced by estimated needs.  Ongoing  GOAL:   Provide needs based on ASPEN/SCCM guidelines  Addressed via TPN + EN  MONITOR:   Vent status,TF tolerance,Labs,Weight trends  REASON FOR ASSESSMENT:   Ventilator    ASSESSMENT:   80 y.o. male with medical history of controlled type 2 DM with neuropathy, pancreatic insufficiency, HTN, HLD, and GERD. He presented to the ED with progressive worsening weakness, diarrhea, very poor appetite, cough, and intermittent fever starting on 1/30. He tested positive for COVID as an outpatient.  2/17- intubated, NGT placed for decompression 2/20- s/p ex lap with SBR x 2, wound vac placement Reviewed I/O's: +53 ml x 24 hours and +1.7 L since admission,  PICC placed 2/21- TPN initiated  2/23- s/p exploration, no further ischemia, returned to continuity, abdominal closure   Having low grade fevers. Off pressors. Off propofol. Okay to start trickle feeds per surgery. NG located in distal part of the stomach per CT. Recommend transition to Cortrak tube Friday if unable to extubate/advance diet. No BM in 8 days. Monitor for return of bowel function.   TPN running at 40 ml/hr to provide 60 g protein and 732 kcal. No plans to advance given hyperglycemia. Remains on insulin drip., CBGs trending down within ICU range today. Lipids being withheld due to elevated triglycerides. Plan to recheck lab Friday.   Admission weight: 116.3 kg  Current  weight: 115.4 kg   Patient requiring ventilator support MV: 13.5 L/min Temp (24hrs), Avg:98.2 F (36.8 C), Min:97 F (36.1 C), Max:101.66 F (38.7 C)   UOP: 1640 ml x 24 hrs  Wound VAC abdomen: 350 ml x 24 hrs   Drips: precedex, insulin, LR @ 75 ml/hr Medications: 10 g lokelma BID Labs: Na 149 (H) K 6.2 (H) Phosphorus 5.5 (H) Mg 2.9 (H) Cr 2.62- trending up CBG 125-282 TGs 567 (12/21)  Diet Order:   Diet Order            Diet NPO time specified  Diet effective midnight                 EDUCATION NEEDS:   Not appropriate for education at this time  Skin:  Skin Assessment: Skin Integrity Issues: Skin Integrity Issues:: Wound VAC,Incisions Wound Vac: abdominal incision Incisions: closed abdomen  Last BM:  2/15  Height:   Ht Readings from Last 1 Encounters:  01/05/21 6\' 4"  (1.93 m)    Weight:   Wt Readings from Last 1 Encounters:  12/26/2020 115.4 kg    Ideal Body Weight:  91.82 kg  BMI:  Body mass index is 30.97 kg/m.  Estimated Nutritional Needs:   Kcal:  2700-2900 kcal  Protein:  165-180 grams  Fluid:  >/= 2 L/day  Mariana Single RD, LDN Clinical Nutrition Pager listed in [place]

## 2021-01-12 NOTE — Progress Notes (Signed)
PHARMACY - TOTAL PARENTERAL NUTRITION CONSULT NOTE   Indication: Prolonged ileus  Patient Measurements: Height: 6\' 4"  (193 cm) Weight: 115.4 kg (254 lb 6.6 oz) IBW/kg (Calculated) : 86.8 TPN AdjBW (KG): 93.7 Body mass index is 30.97 kg/m. Usual Weight: 120 kg on 11/04/20  Assessment:  80 yo male with hx DM2, neuropathy, pancreatic insufficiency, hypertension, GERD who required intubation 2/17 for COVID pneumonia.  He developed lactic acidosis concerning for peritonitis, CT showed possible enteritis/ischemia.  On 2/20, developed worsening lactic acidosis concerning for bowel ischemia and is now s/p small bowel resection times two, placement abthera wound vac.  Pharmacy consulted for TPN.  Glucose / Insulin: Hx DM2. A1c 6.7 on 11/04/20. Started on insulin gtt 2/22 AM. Received ~ 72 units from insulin gtt so far and 7 units SSI prior to insulin gtt starting. Insulin drip requirements decreasing.  - Methylprednisolone 60mg  BID to 40mg  daily on 2/21 & discontinued on 2/23. LD 2/23 AM.  Electrolytes: Na 149. K 6.2. Cl 117. Corrected Ca ~8.9. CO2 20. Phos 5.5. Mg 3.9.  Renal: AKI. SCR 2.62 (BL ~1.1) BUN 166 - increasing. UOP 0.6  ml/kg/hr. LFTs / TGs: AlkPhos 138>106, AST/ALT/Tbili wnl, TG 567 (propofol stopped) Prealbumin / albumin: Prealbumin 22.1; Albumin 2.4 Intake / Output: UOP 0.6 ml/kg/hr, NG 100 mL / 24 hrs, Drain 350 ml / 24 hrs MIVF: LR @ 75 ml/hr GI Imaging: 2/18 CT abd: segmental enteritis in right lower quadrant concerning for early ischemia. No bowel obstruction. Surgeries / Procedures:  2/20: ex-lap with small bowel resection x 2 2/22: reopening of ex-lap, anastomosis of small intestine x2  Central access: PICC 2/20 TPN start date: 2/21  Nutritional Goals (RD recommendations 2/18): KCal: 8299-3716 , >/= 150g Protein: , Fluid: >/= 3 L/day Goal TPN rate is 100 mL/hr provides 151 g of protein/day -  Average 2033 Kcal with lipids only MWF (20g/L) due to national  shortage  Current Nutrition:  TPN TF - resuming at 10-20 ml/hr today per surgery  Plan:  Will not advance TPN due to hyperglycemia - continue TPN at 40 mL/hr. This provides 60g AA and 732 kcal. Hold lipids for hypertriglyceridemia - ordered TG for Friday morning Electrolytes in TPN: Remove Na. Continue 0 mEq/L of K, 0 mmol/L of Phos, 0 mEq/L Mg, 5 mEq/L of Ca. Max acetate.  Continue insulin gtt and add insulin 15 units to TPN to start at 1800 - starting low due to steroids discontinued, D5 MIVF stopped, and decreasing requirements on insulin gtt. Insulin gtt may be transitioned off after new TPN containing insulin is started if if appropriate.  Endotool must be reprogrammed when new TPN with insulin is started at 1800 to avoid hypoglycemia - RN aware  Continue LR MIVF per CCM  Add standard MVI and trace elements to TPN TPN labs Monday/Thursday, repeat electrolytes tomorrow   Cristela Felt, PharmD Clinical Pharmacist  01/12/2021, 7:31 AM

## 2021-01-12 NOTE — Progress Notes (Addendum)
NAME:  Thomas Chung, MRN:  [id number], DOB:  [birth year], LOS: 27 ADMISSION DATE:  12/24/2020, CONSULTATION DATE: 01/06/2019 REFERRING MD: Triad, CHIEF COMPLAINT: Acute hypoxic respiratory failure  Brief History:  80 year old with history of Covid on 12/22/2020 who defervesced and required intubation on 01/06/2021.   Worsening lactic acidosis and leukocytosis 2/20 taken to OR found to have necrotic and ischemic small bowel s/p resection x 2 with wound vac.  Also questionable seizure activity, transferred to [facility] for LTM and further Neurology evaluation.   History of Present Illness:  80 year old former smoker quit when he was 89 he has a past medical history for Covid on 12/22/2020, along with type 2 diabetes with neuropathy, pancreatic insufficiency,  Hypertension, and  history of gastroesophageal reflux disease.  He was admitted on 01/03/2021 with worsening respiratory distress and suspected ileus.  On 01/04/2021 had increasing oxygen demand demands and by 01/06/2021 was on 100% 35 L flow of oxygen pulmonary critical care was asked to evaluate and he was intubated at that time.  He has completed antiviral therapy.  He remains on Solu-Medrol and Acterma.  Currently is on full mechanical ventilatory support ARDS protocol.    Past Medical History:   Past Medical History:  Diagnosis Date  . Depression   . Fatty liver   . Glucose intolerance (impaired glucose tolerance)   . HOH (hard of hearing)    bilateral hearing aides  . Hx of colonic polyps   . Hyperlipidemia   . Hypertension   . Neuromuscular disorder ([place])    neuropathy feet    Significant Hospital Events:  01/06/2021 intubated 2/18 improving oxygenation however persistent lactic acidosis and abdomen exam concerning for peritonitis. CT abdomen showed possible enteritis/ischemia. Gen surgery consulted. Also had episode of seizure like activity. Neuro consulted 2/19 Cr peaked. UOP improving. FIO2 40% 2/20 Worsening lactic acidosis  concerning for bowel ischemia. To OR with ischemic small bowel s/p resection x 2.  Questionable seizure activity, transferred to [facility] for LTM/ Neurology   Consults:  General surgery Critical care  Procedures:  2/17 ETT >> 2/20 OR- ex lap/ resection x 2 w/ open abd  2/20 RUE PICC >>  Significant Diagnostic Tests:  2/18 ct abd>>segmental enteritis in right lower quadrant concerning for early ischemia. No bowel obstruction. Splenomegaly with findings for possible hypoperfusion/early infarct. Basilar lung with consolidation  2/21 MRI brain >> 1. Small foci of abnormal diffusion restriction in the right thalamus, possibly post-ictal or subacute ischemia. 2. Areas of magnetic susceptibility effect in the basal ganglia and thalami likely indicate petechial blood superimposed on chronic mineralization. Additionally, susceptibility artifacts can create false positives on diffusion-weighted imaging.  Micro Data:  01/13/2021 BCx >> neg 01/05/2021 MRSA >> neg 01/07/2020 trach asp >> Klebsiella oxytoca (amp resistant, otherwise pan sensitive) 01/07/2021 blood cultures x2>> clostridium cadaveris  01/07/2021 urine culture>>  Antimicrobials:  01/07/2021 vancomycin>> 01/07/2021 cefepime 01/07/2021 zosyn >>  remdesivir 2/12 >> 2/16 Decadron 2/12 Solumedrol 2/13 >> tocilizumab 2/16  Interim History / Subjective:  No events Back in continuity Heavily sedated Low grade fevers  Objective   Blood pressure (!) 142/59, pulse 96, temperature (!) 100.58 F (38.1 C), resp. rate 11, height 6\' 4"  (1.93 m), weight 115.4 kg, SpO2 92 %. CVP:  [14 mmHg] 14 mmHg  Vent Mode: Bi-Vent FiO2 (%):  [50 %] 50 % Set Rate:  [20 bmp] 20 bmp PEEP:  [5 cmH20] 5 cmH20   Intake/Output Summary (Last 24 hours) at 01/12/2021 4315 Last data filed at 01/12/2021  0829 Gross per 24 hour  Intake 5104.29 ml  Output 2090 ml  Net 3014.29 ml   Filed Weights   01/07/2021 1227 01/10/21 0600 01/06/2021 0513  Weight: 114.9 kg 110.1 kg  115.4 kg   Physical Exam: Constitutional: ill appearing man on vent  Eyes: pupils pinpoint but reactive Ears, nose, mouth, and throat: ETT in place, minimal secretions Cardiovascular: RRR, ext warm Respiratory: scattered rhonci, triggering vent on aprv Gastrointestinal: incision site CDI, hypoactive bS Skin: No rashes, normal turgor Neurologic: opens eyes to voice but then falls quickly back asleep, need to wean fent Psychiatric: RASS -1 Ext: stable duskiness in bilateral feet  Only CBC back- WBC still high ?contribution of steroids BMP pending   Resolved Hospital Problem list     Assessment & Plan:   Acute hypoxemic respiratory failure/ARDS secondary to [id number] complicated by Klebsiella oxytoca HCAP - Continue APRV, switch fent to precedex and see how he weans - Stop steroids, he's 10 days in  Ischemic small bowel s/p resection x 2 with wound vac 2/20 and open abd and re-exploration, anastamosis 2/22 Sepsis secondary to above  Clostridium cadaveris bacteremia - Continue zosyn, duration TBD - Wound care per CCS - Trickle TF today - Continue TPN  Seizure-like activity - extensive workup detailed in Dr. Johny Chess note, nothing acute to do here other than wean sedation  Acute kidney injury- ATN in setting of sepsis and ischemic bowel - f/u BMP; BUN and third spacing may limit diuretic plans  DM2 with hyperglycemia- will see how does with insulin in TPN bag and stopping steroids  Ischemic appearing feet- dopplers okay, question microthrombi from COVID infection, clinically monitor  Best practice (evaluated daily)  Diet: trickle feeds, TPN Pain/Anxiety/Delirium protocol (if indicated): PAD protocol VAP protocol (if indicated): Yes DVT prophylaxis: PPI Glucose control: some combination of insulin drip and insulin in TPN bag, defer to pharmD Mobility: Bedrest Disposition: Intensive care unit  Goals of Care:  Last date of multidisciplinary goals of care discussion:  01/06/2021 via phone Family and staff present: 01/06/2021 spoke with wife and daughter made them aware of impending intubation.Updated in person 2/17 per MD Summary of discussion: Agree they want intubation if necessary. Follow up goals of care discussion due:2/24 Code Status: Full code   Patient critically ill due to COVID pneumonia Interventions to address this today ventilator titration Risk of deterioration without these interventions is high  I personally spent 38 minutes providing critical care not including any separately billable procedures  Erskine Emery MD [facility]  Prefer epic messenger for cross cover needs If after hours, please call E-link

## 2021-01-12 NOTE — Progress Notes (Signed)
[facility] Progress Note Patient Name: Thomas Chung DOB: November 18, 1941 MRN: [id number]   Date of Service  01/12/2021  HPI/Events of Note  Patient with sub-optimal BP control with 10 mg of Labetalol PRN.  eICU Interventions  PRN Labetalol dose increased to 10-20 mg Q  4 hours.        Kerry Kass Camala Talwar 01/12/2021, 8:20 PM

## 2021-01-13 ENCOUNTER — Inpatient Hospital Stay (HOSPITAL_COMMUNITY): Payer: Medicare HMO

## 2021-01-13 DIAGNOSIS — U071 COVID-19: Secondary | ICD-10-CM | POA: Diagnosis not present

## 2021-01-13 DIAGNOSIS — N179 Acute kidney failure, unspecified: Secondary | ICD-10-CM | POA: Diagnosis not present

## 2021-01-13 DIAGNOSIS — D72829 Elevated white blood cell count, unspecified: Secondary | ICD-10-CM | POA: Diagnosis not present

## 2021-01-13 DIAGNOSIS — J069 Acute upper respiratory infection, unspecified: Secondary | ICD-10-CM | POA: Diagnosis not present

## 2021-01-13 LAB — GLUCOSE, CAPILLARY
Glucose-Capillary: 140 mg/dL — ABNORMAL HIGH (ref 70–99)
Glucose-Capillary: 144 mg/dL — ABNORMAL HIGH (ref 70–99)
Glucose-Capillary: 147 mg/dL — ABNORMAL HIGH (ref 70–99)
Glucose-Capillary: 151 mg/dL — ABNORMAL HIGH (ref 70–99)
Glucose-Capillary: 156 mg/dL — ABNORMAL HIGH (ref 70–99)
Glucose-Capillary: 166 mg/dL — ABNORMAL HIGH (ref 70–99)
Glucose-Capillary: 178 mg/dL — ABNORMAL HIGH (ref 70–99)
Glucose-Capillary: 190 mg/dL — ABNORMAL HIGH (ref 70–99)
Glucose-Capillary: 220 mg/dL — ABNORMAL HIGH (ref 70–99)
Glucose-Capillary: 150 mg/dL — ABNORMAL HIGH (ref 70–99)
Glucose-Capillary: 180 mg/dL — ABNORMAL HIGH (ref 70–99)
Glucose-Capillary: 234 mg/dL — ABNORMAL HIGH (ref 70–99)
Glucose-Capillary: 262 mg/dL — ABNORMAL HIGH (ref 70–99)

## 2021-01-13 LAB — BLOOD CULTURE ID PANEL (REFLEXED) - BCID2

## 2021-01-13 LAB — PHOSPHORUS: Phosphorus: 5.2 mg/dL — ABNORMAL HIGH (ref 2.5–4.6)

## 2021-01-13 LAB — COMPREHENSIVE METABOLIC PANEL
ALT: 27 U/L (ref 0–44)
AST: 47 U/L — ABNORMAL HIGH (ref 15–41)
Albumin: 1.3 g/dL — ABNORMAL LOW (ref 3.5–5.0)
Alkaline Phosphatase: 68 U/L (ref 38–126)
Anion gap: 9 (ref 5–15)
BUN: 178 mg/dL — ABNORMAL HIGH (ref 8–23)
CO2: 21 mmol/L — ABNORMAL LOW (ref 22–32)
Calcium: 6.9 mg/dL — ABNORMAL LOW (ref 8.9–10.3)
Chloride: 117 mmol/L — ABNORMAL HIGH (ref 98–111)
Creatinine, Ser: 2.7 mg/dL — ABNORMAL HIGH (ref 0.61–1.24)
GFR, Estimated: 23 mL/min — ABNORMAL LOW (ref >60)
Glucose, Bld: 146 mg/dL — ABNORMAL HIGH (ref 70–99)
Potassium: 5 mmol/L (ref 3.5–5.1)
Sodium: 147 mmol/L — ABNORMAL HIGH (ref 135–145)
Total Bilirubin: 1 mg/dL (ref 0.3–1.2)
Total Protein: <3 g/dL — ABNORMAL LOW (ref 6.5–8.1)

## 2021-01-13 LAB — CBC
HCT: 35.4 % — ABNORMAL LOW (ref 39.0–52.0)
Hemoglobin: 11.2 g/dL — ABNORMAL LOW (ref 13.0–17.0)
MCH: 26 pg (ref 26.0–34.0)
MCHC: 31.6 g/dL (ref 30.0–36.0)
MCV: 82.1 fL (ref 80.0–100.0)
Platelets: 115 10*3/uL — ABNORMAL LOW (ref 150–400)
RBC: 4.31 MIL/uL (ref 4.22–5.81)
RDW: 17.4 % — ABNORMAL HIGH (ref 11.5–15.5)
WBC: 65.9 10*3/uL (ref 4.0–10.5)
nRBC: 8.7 % — ABNORMAL HIGH (ref 0.0–0.2)

## 2021-01-13 LAB — PATHOLOGIST SMEAR REVIEW

## 2021-01-13 LAB — HEPATITIS B CORE ANTIBODY, TOTAL: Hep B Core Total Ab: NONREACTIVE

## 2021-01-13 LAB — HEPATITIS B SURFACE ANTIGEN: Hepatitis B Surface Ag: NONREACTIVE

## 2021-01-13 LAB — MAGNESIUM: Magnesium: 3.6 mg/dL — ABNORMAL HIGH (ref 1.7–2.4)

## 2021-01-13 LAB — HEPATITIS B SURFACE ANTIBODY,QUALITATIVE: Hep B S Ab: NONREACTIVE

## 2021-01-13 MED ORDER — LIDOCAINE-PRILOCAINE 2.5-2.5 % EX CREA
1.0000 "application " | TOPICAL_CREAM | CUTANEOUS | Status: DC | PRN
  Filled 2021-01-13: qty 5

## 2021-01-13 MED ORDER — HEPARIN SODIUM (PORCINE) 1000 UNIT/ML DIALYSIS
1000.0000 [IU] | INTRAMUSCULAR | Status: DC | PRN
  Filled 2021-01-13 (×5): qty 1

## 2021-01-13 MED ORDER — INSULIN ASPART 100 UNIT/ML ~~LOC~~ SOLN
3.0000 [IU] | SUBCUTANEOUS | Status: DC
  Administered 2021-01-13 – 2021-01-14 (×7): 3 [IU] via SUBCUTANEOUS

## 2021-01-13 MED ORDER — AMIODARONE HCL IN DEXTROSE 360-4.14 MG/200ML-% IV SOLN
30.0000 mg/h | INTRAVENOUS | Status: DC
  Administered 2021-01-14 – 2021-01-15 (×4): 30 mg/h via INTRAVENOUS
  Filled 2021-01-13 (×3): qty 200

## 2021-01-13 MED ORDER — INSULIN DETEMIR 100 UNIT/ML ~~LOC~~ SOLN
10.0000 [IU] | Freq: Two times a day (BID) | SUBCUTANEOUS | Status: DC
  Filled 2021-01-13: qty 0.1

## 2021-01-13 MED ORDER — SODIUM CHLORIDE 0.9 % IV SOLN
100.0000 mL | INTRAVENOUS | Status: DC | PRN
  Administered 2021-01-14: 100 mL via INTRAVENOUS

## 2021-01-13 MED ORDER — PENTAFLUOROPROP-TETRAFLUOROETH EX AERO: 1.0000 "application " | INHALATION_SPRAY | CUTANEOUS | Status: DC | PRN

## 2021-01-13 MED ORDER — ACETAMINOPHEN 160 MG/5ML PO SOLN
650.0000 mg | Freq: Four times a day (QID) | ORAL | Status: DC | PRN
  Administered 2021-01-13 – 2021-01-15 (×3): 650 mg
  Filled 2021-01-13 (×3): qty 20.3

## 2021-01-13 MED ORDER — INSULIN ASPART 100 UNIT/ML ~~LOC~~ SOLN: 1.0000 [IU] | SUBCUTANEOUS | Status: DC

## 2021-01-13 MED ORDER — INSULIN DETEMIR 100 UNIT/ML ~~LOC~~ SOLN
10.0000 [IU] | Freq: Two times a day (BID) | SUBCUTANEOUS | Status: DC
  Administered 2021-01-13: 10 [IU] via SUBCUTANEOUS
  Filled 2021-01-13 (×2): qty 0.1

## 2021-01-13 MED ORDER — TRAVASOL 10 % IV SOLN
INTRAVENOUS | Status: AC
  Filled 2021-01-13: qty 907.2

## 2021-01-13 MED ORDER — AMIODARONE HCL IN DEXTROSE 360-4.14 MG/200ML-% IV SOLN
INTRAVENOUS | Status: AC
  Filled 2021-01-13: qty 200

## 2021-01-13 MED ORDER — INSULIN ASPART 100 UNIT/ML ~~LOC~~ SOLN
0.0000 [IU] | SUBCUTANEOUS | Status: DC
  Administered 2021-01-13: 11 [IU] via SUBCUTANEOUS
  Administered 2021-01-13: 4 [IU] via SUBCUTANEOUS
  Administered 2021-01-13: 7 [IU] via SUBCUTANEOUS
  Administered 2021-01-13: 3 [IU] via SUBCUTANEOUS
  Administered 2021-01-14 (×3): 4 [IU] via SUBCUTANEOUS
  Administered 2021-01-14: 7 [IU] via SUBCUTANEOUS
  Administered 2021-01-14: 3 [IU] via SUBCUTANEOUS
  Administered 2021-01-14: 4 [IU] via SUBCUTANEOUS
  Administered 2021-01-15: 3 [IU] via SUBCUTANEOUS
  Administered 2021-01-15 (×3): 4 [IU] via SUBCUTANEOUS
  Administered 2021-01-15: 3 [IU] via SUBCUTANEOUS

## 2021-01-13 MED ORDER — CALCIUM GLUCONATE-NACL 1-0.675 GM/50ML-% IV SOLN
1.0000 g | Freq: Once | INTRAVENOUS | Status: AC
  Administered 2021-01-13: 1000 mg via INTRAVENOUS
  Filled 2021-01-13: qty 50

## 2021-01-13 MED ORDER — AMIODARONE LOAD VIA INFUSION
150.0000 mg | Freq: Once | INTRAVENOUS | Status: DC
  Filled 2021-01-13: qty 83.34

## 2021-01-13 MED ORDER — AMIODARONE HCL IN DEXTROSE 360-4.14 MG/200ML-% IV SOLN
60.0000 mg/h | INTRAVENOUS | Status: AC
  Administered 2021-01-13: 60 mg/h via INTRAVENOUS
  Filled 2021-01-13 (×2): qty 200

## 2021-01-13 MED ORDER — SODIUM CHLORIDE 0.9 % IV SOLN: 100.0000 mL | INTRAVENOUS | Status: DC | PRN

## 2021-01-13 MED ORDER — DEXTROSE 5 % IV SOLN: INTRAVENOUS | Status: AC

## 2021-01-13 MED ORDER — FUROSEMIDE 10 MG/ML IJ SOLN
80.0000 mg | Freq: Once | INTRAMUSCULAR | Status: AC
  Administered 2021-01-13: 80 mg via INTRAVENOUS
  Filled 2021-01-13: qty 8

## 2021-01-13 MED ORDER — DEXTROSE 10 % IV SOLN: INTRAVENOUS | Status: DC | PRN

## 2021-01-13 MED ORDER — LIDOCAINE HCL (PF) 1 % IJ SOLN: 5.0000 mL | INTRAMUSCULAR | Status: DC | PRN

## 2021-01-13 MED ORDER — CHLORHEXIDINE GLUCONATE CLOTH 2 % EX PADS
6.0000 | MEDICATED_PAD | Freq: Every day | CUTANEOUS | Status: DC
  Administered 2021-01-13 – 2021-01-15 (×2): 6 via TOPICAL

## 2021-01-13 MED ORDER — PIVOT 1.5 CAL PO LIQD
1000.0000 mL | ORAL | Status: DC
  Administered 2021-01-13 (×2): 1000 mL

## 2021-01-13 MED ORDER — LABETALOL HCL 5 MG/ML IV SOLN
10.0000 mg | INTRAVENOUS | Status: DC | PRN
  Administered 2021-01-13 (×3): 20 mg via INTRAVENOUS
  Filled 2021-01-13 (×3): qty 4

## 2021-01-13 MED ORDER — ALTEPLASE 2 MG IJ SOLR: 2.0000 mg | Freq: Once | INTRAMUSCULAR | Status: DC | PRN

## 2021-01-13 NOTE — Progress Notes (Deleted)
CBG values not crossing over into epic. 1900-0500 values listed below:  1957: 190  2111: 275  1700: 174  9449: 675  9163: 846  6599: 357  0177: 939  0300: 923

## 2021-01-13 NOTE — Progress Notes (Signed)
NAME:  Thomas Chung, MRN:  [id number], DOB:  Nov 18, 1941, LOS: 12 ADMISSION DATE:  12/31/2020, CONSULTATION DATE: 01/06/2019 REFERRING MD: Triad, CHIEF COMPLAINT: Acute hypoxic respiratory failure  Brief History:  80 year old with history of Covid on 01/13/2021 who defervesced and required intubation on 01/06/2021.   Worsening lactic acidosis and leukocytosis 2/20 taken to OR found to have necrotic and ischemic small bowel s/p resection x 2 with wound vac.  Also questionable seizure activity, transferred to [facility] for LTM and further Neurology evaluation.   History of Present Illness:  80 year old former smoker quit when he was 55 he has a past medical history for Covid on 12/22/2020, along with type 2 diabetes with neuropathy, pancreatic insufficiency,  Hypertension, and  history of gastroesophageal reflux disease.  He was admitted on 12/26/2020 with worsening respiratory distress and suspected ileus.  On 01/04/2021 had increasing oxygen demand demands and by 01/06/2021 was on 100% 35 L flow of oxygen pulmonary critical care was asked to evaluate and he was intubated at that time.  He has completed antiviral therapy.  He remains on Solu-Medrol and Acterma.  Currently is on full mechanical ventilatory support ARDS protocol.    Past Medical History:   Past Medical History:  Diagnosis Date  . Depression   . Fatty liver   . Glucose intolerance (impaired glucose tolerance)   . HOH (hard of hearing)    bilateral hearing aides  . Hx of colonic polyps   . Hyperlipidemia   . Hypertension   . Neuromuscular disorder ([place])    neuropathy feet    Significant Hospital Events:  01/06/2021 intubated 2/18 improving oxygenation however persistent lactic acidosis and abdomen exam concerning for peritonitis. CT abdomen showed possible enteritis/ischemia. Gen surgery consulted. Also had episode of seizure like activity. Neuro consulted 2/19 Cr peaked. UOP improving. FIO2 40% 2/20 Worsening lactic acidosis  concerning for bowel ischemia. To OR with ischemic small bowel s/p resection x 2. Questionable seizure activity, transferred to [facility] for LTM/ Neurology  2/24 off sedation for 24hr, nephrology consulted for possible need for iHD   Consults:  General surgery Critical care  Procedures:  2/17 ETT >> 2/20 OR- ex lap/ resection x 2 w/ open abd  2/20 RUE PICC >>  Significant Diagnostic Tests:  2/18 ct abd>>segmental enteritis in right lower quadrant concerning for early ischemia. No bowel obstruction. Splenomegaly with findings for possible hypoperfusion/early infarct. Basilar lung with consolidation  2/21 MRI brain >> 1. Small foci of abnormal diffusion restriction in the right thalamus, possibly post-ictal or subacute ischemia. 2. Areas of magnetic susceptibility effect in the basal ganglia and thalami likely indicate petechial blood superimposed on chronic mineralization. Additionally, susceptibility artifacts can create false positives on diffusion-weighted imaging.  Micro Data:  12/22/2020 BCx >> neg 01/05/2021 MRSA >> neg 01/07/2020 trach asp >> Klebsiella oxytoca (amp resistant, otherwise pan sensitive) 01/07/2021 blood cultures x2>> clostridium cadaveris  01/07/2021 urine culture>>  Antimicrobials:  01/07/2021 vancomycin>> 01/07/2021 cefepime 01/07/2021 zosyn >>  remdesivir 2/12 >> 2/16 Decadron 2/12 Solumedrol 2/13 >> tocilizumab 2/16  Interim History / Subjective:  Issues with hypertension overnight  Currently 6L positive  Off sedation   Objective   Blood pressure (!) 139/45, pulse 74, temperature 98.78 F (37.1 C), resp. rate (!) 28, height 6\' 4"  (1.93 m), weight 115.4 kg, SpO2 (!) 89 %.    Vent Mode: PCV FiO2 (%):  [50 %] 50 % Set Rate:  [14 bmp] 14 bmp PEEP:  [5 cmH20] 5 cmH20 Pressure Support:  [02  [place] Pressure:  [16 cmH20-17 cmH20] 16 cmH20   Intake/Output Summary (Last 24 hours) at 01/13/2021 0742 Last data filed at 01/13/2021 7616 Gross per  24 hour  Intake 3421.29 ml  Output 2725 ml  Net 696.29 ml   Filed Weights   01/08/2021 1227 01/10/21 0600 01/10/2021 0513  Weight: 114.9 kg 110.1 kg 115.4 kg   Physical Exam: General: Visual assessment from door: acute ill appearing elderly male lying in bed in NAD HEENT: [place]/AT, ETT Neuro: Deferred to physician exam  CV: Telemetry read with NSR PULM:  Deferred to physicians exam  GI:  tolerating trickel TF per nurse, TPN remains  Extremities: Deferred to physician exam  Skin: no rashes   Resolved Hospital Problem list     Assessment & Plan:   Acute hypoxemic respiratory failure/ARDS secondary to [id number]-06 complicated by Klebsiella oxytoca HCAP P: Continue ventilator support with lung protective strategies  Wean PEEP and FiO2 for sats greater than 90%. Head of bed elevated 30 degrees. Plateau pressures less than 30 cm H20.  Follow intermittent chest x-ray and ABG.   SAT/SBT as tolerated, mentation preclude extubation  Ensure adequate pulmonary hygiene  Follow cultures  VAP bundle in place  PAD protocol Sedation remains off  Steroids stopped 2/23  Ischemic small bowel  -s/p resection x 2 with wound vac 2/20 and open abd and re-exploration, anastamosis 2/22 Sepsis secondary to above  Clostridium cadaveris bacteremia -Likely secondary to bowel ischemia  P: Continue Zosyn per ID, duration TBD Wound care per CCS Advance TF rate per CCS, currently tolerating trickle feeds Remains on TPN  Multifactorial toxic metabolic encephalopathy -Bun continues to remain elevated at 178 Seizure-like activity  - extensive workup detailed in Dr. Johny Chess note P: Sedation wean off as of 2/24 Consider a trial of iHD to help with renal clearance  Remain off sedation   Acute kidney injury - ATN in setting of sepsis and ischemic bowel P: Consult nephrology Follow renal function / urine output Trend Bmet Avoid nephrotoxins Ensure adequate renal perfusion  Enteral hydration  DM2 with  hyperglycemia P: Insulin though TPN CBG q4 SSI Continue long acting insulin   Ischemic appearing feet -Question microthrombi from COVID infection, clinically monitor P: Frequent neurovascular checks  Pulses remain dopplerable   Best practice (evaluated daily)  Diet: trickle feeds, TPN Pain/Anxiety/Delirium protocol (if indicated): PAD protocol VAP protocol (if indicated): Yes DVT prophylaxis: PPI Glucose control: some combination of insulin drip and insulin in TPN bag, defer to pharmD Mobility: Bedrest Disposition: Intensive care unit  Goals of Care:  Last date of multidisciplinary goals of care discussion: 01/06/2021 via phone Family and staff present: 01/06/2021 spoke with wife and daughter made them aware of impending intubation.Updated in person 2/17 per MD Summary of discussion: Agree they want intubation if necessary. Follow up goals of care discussion due:2/24 Code Status: Full code  CRITICAL CARE Performed by: Johnsie Cancel  Total critical care time: 40 minutes  Critical care time was exclusive of separately billable procedures and treating other patients.  Critical care was necessary to treat or prevent imminent or life-threatening deterioration.  Critical care was time spent personally by me on the following activities: development of treatment plan with patient and/or surrogate as well as nursing, discussions with consultants, evaluation of patient's response to treatment, examination of patient, obtaining history from patient or surrogate, ordering and performing treatments and interventions, ordering and review of laboratory studies, ordering and review of radiographic studies, pulse oximetry and re-evaluation  of patient's condition.  Johnsie Cancel, NP-C [facility] Personal contact information can be found on Amion  If no response please page: Adult pulmonary and critical care medicine pager on Amion unitl 7pm After 7pm please call  [phone] 01/13/2021, 8:09 AM

## 2021-01-13 NOTE — Procedures (Signed)
Central Venous Catheter Insertion Procedure Note DUELL HOLDREN [id number] [birth year]  Procedure: Insertion of Triple Lumen HD catheter Indications: Assessment of intravascular volume and Drug and/or fluid administration, Hemodialysis  Procedure Details Consent: Risks of procedure as well as the alternatives and risks of each were explained to the (patient/caregiver).  Consent for procedure obtained. Time Out: Verified patient identification, verified procedure, site/side was marked, verified correct patient position, special equipment/implants available, medications/allergies/relevent history reviewed, required imaging and test results available.  Performed  Maximum sterile technique was used including antiseptics, cap, gloves, gown, hand hygiene, mask and sheet. Skin prep: Chlorhexidine; local anesthetic administered A antimicrobial bonded/coated triple lumen catheter was placed in the right internal jugular vein using the Seldinger technique.  Evaluation Blood flow good Complications: No apparent complications Patient did tolerate procedure well. Chest X-ray ordered to verify placement.  CXR: pending.  [place]  IMTS PGY-2 01/13/2021, 12:49 PM

## 2021-01-13 NOTE — Progress Notes (Signed)
[place] for Infectious Disease    Date of Admission:  12/28/2020   Total days of antibiotics 7           ID: Thomas Chung is a 80 y.o. male with  Principal Problem:   Acute respiratory disease due to COVID-19 virus Active Problems:   Type II diabetes mellitus with manifestations ([place])   Hyperlipidemia with target LDL less than 100   Essential hypertension, benign   Exocrine pancreatic insufficiency   Pneumonia due to COVID-19 virus   Elevated serum creatinine   ARDS (adult respiratory distress syndrome) (H[facility])   Ischemic bowel disease (H[facility])   Bacteremia   Severe sepsis without septic shock ([facility])    Subjective: Afebrile, yesterday went back to or and anastomosis areas still intact, no other evidence of ischemic bowel. Tolerating low volume TF, no diarrhea.  aki continuing to worsen, BUN 178/ cr 2.7, being evaluated by renal for CRRT/HD; wbc continues to increase 66K  Medications:  . chlorhexidine gluconate (MEDLINE KIT)  15 mL Mouth Rinse BID  . Chlorhexidine Gluconate Cloth  6 each Topical Daily  . Chlorhexidine Gluconate Cloth  6 each Topical Q0600  . enoxaparin (LOVENOX) injection  30 mg Subcutaneous Q24H  . feeding supplement (PIVOT 1.5 CAL)  1,000 mL Per Tube Q24H  . insulin aspart  0-20 Units Subcutaneous Q4H  . insulin aspart  3 Units Subcutaneous Q4H  . mouth rinse  15 mL Mouth Rinse 10 times per day  . mupirocin ointment  1 application Nasal BID  . pantoprazole (PROTONIX) IV  40 mg Intravenous Q24H  . sodium chloride flush  10-40 mL Intracatheter Q12H    Objective: Vital signs in last 24 hours: Temp:  [96.1 F (35.6 C)-101.3 F (38.5 C)] 98.6 F (37 C) (02/24 0800) Pulse Rate:  [66-102] 76 (02/24 0800) Resp:  [20-29] 29 (02/24 0800) BP: (126-190)/(45-73) 148/53 (02/24 0800) SpO2:  [89 %-93 %] 89 % (02/24 0800) FiO2 (%):  [50 %-60 %] 60 % (02/24 1100)  Physical Exam  Constitutional: sedated. He appears well-developed and well-nourished. No  distress.  HENT: OETT in place Mouth/Throat: Oropharynx is dry. No oropharyngeal exudate- from limited visualization Cardiovascular: Normal rate, regular rhythm and normal heart sounds. Exam reveals no gallop and no friction rub.  No murmur heard.  Pulmonary/Chest: Effort normal and breath sounds normal. No respiratory distress. He has no wheezes.  Abdominal: Soft. Bowel sounds are decreased. Mild wincing with palpation Ext: +pitting edema Neurological: He is alert and oriented to person, place, and time.  Skin: cyanotic/ischemic change to R>L    Lab Results Recent Labs    01/12/21 0524 01/12/21 0911 01/13/21 0451  WBC 51.9*  --  65.9*  HGB 10.5*  --  11.2*  HCT 35.9*  --  35.4*  NA  --  149* 147*  K  --  6.2* 5.0  CL  --  117* 117*  CO2  --  20* 21*  BUN  --  166* 178*  CREATININE  --  2.62* 2.70*   Liver Panel Recent Labs    01/13/21 0451  PROT <3.0*  ALBUMIN 1.3*  AST 47*  ALT 27  ALKPHOS 68  BILITOT 1.0    Microbiology: Clostridium species - 2/18 blood cx Studies/Results: No results found.   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 No new  source of infection noted at this time  aki = HD catheter being placed today for anticipated HD  covid pneumonia with respiratory distress/hypoxia = treated with antiviral, steroids remains intubated;managed per [place] for Infectious Diseases Cell: [phone] Pager: [phone]  01/13/2021, 1:10 PM

## 2021-01-13 NOTE — Consult Note (Signed)
[facility] Renal Consultation Note  Requesting MD: Erskine Emery, MD Indication for Consultation:  AKI   Chief complaint: weakness and diarrhea  HPI:  Thomas Chung is a 80 y.o. male with a history of type 2 diabetes, hypertension, hyperlipidemia, GERD and depression who presented to the hospital with fatigue and diarrhea.  He tested positive for Covid as an outpatient and was initially managed as outpatient.  Then came with fatigue.  Course here has been complicated by respiratory failure with intubation on 2/20. Course also complicated by bowel ischemia s/p ex-lap and has also had Klebsiella pneumonia on top of his Covid pneumonia.  Pulmonology has had trouble getting him off the ventilator and waking him up.  Note that today his BUN is 178 with Cr 2.70.  Home meds include chlorthalidone, arb, and colchicine PRN.  He had 2.6 liters UOP over 2/23.  Charted as having been on LR and dextrose.  147/55.  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 He would want dialysis/RRT if needed.  Normally very functional.  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  Creat  Date/Time Value Ref Range Status  08/05/2020 01:45 PM 1.20 (H) 0.70 - 1.18 mg/dL Final    Comment:    For patients >69 years of age, the reference limit for Creatinine is approximately 13% higher for people identified as African-American. .    Creatinine, Ser  Date/Time Value Ref Range Status  01/13/2021 04:51 AM 2.70 (H) 0.61 - 1.24 mg/dL Final  01/12/2021 09:11 AM 2.62 (H) 0.61 - 1.24 mg/dL Final  01/10/2021 03:54 AM 2.34 (H) 0.61 - 1.24 mg/dL Final  01/10/2021 02:13 PM 2.55 (H) 0.61 - 1.24 mg/dL Final  01/10/2021 11:55 AM 2.36 (H) 0.61 - 1.24 mg/dL Final  01/10/2021 03:23 AM 2.75 (H) 0.61 - 1.24 mg/dL Final  12/25/2020 03:15 AM 2.98 (H) 0.61 - 1.24 mg/dL Final  01/08/2021 11:34 AM 3.16 (H) 0.61 - 1.24 mg/dL Final  01/08/2021 02:42 AM 3.10 (H) 0.61 - 1.24 mg/dL Final   01/07/2021 07:57 AM 2.63 (H) 0.61 - 1.24 mg/dL Final  01/07/2021 05:22 AM 2.51 (H) 0.61 - 1.24 mg/dL Final    Comment:    DELTA CHECK NOTED  01/06/2021 02:58 AM 1.41 (H) 0.61 - 1.24 mg/dL Final  01/05/2021 04:38 AM 1.08 0.61 - 1.24 mg/dL Final  01/04/2021 04:21 AM 1.02 0.61 - 1.24 mg/dL Final  01/03/2021 02:53 AM 1.11 0.61 - 1.24 mg/dL Final  01/02/2021 04:34 AM 1.02 0.61 - 1.24 mg/dL Final  01/16/2021 05:26 PM 1.27 (H) 0.61 - 1.24 mg/dL Final  12/29/2020 11:23 AM 1.57 (H) 0.76 - 1.27 mg/dL Final  11/04/2020 02:29 PM 1.33 0.40 - 1.50 mg/dL Final  02/03/2020 01:56 PM 1.18 0.40 - 1.50 mg/dL Final  08/06/2019 02:17 PM 1.31 0.40 - 1.50 mg/dL Final  05/06/2019 01:41 PM 1.18 0.40 - 1.50 mg/dL Final  01/21/2019 02:41 PM 1.33 0.40 - 1.50 mg/dL Final  10/29/2018 01:42 PM 1.15 0.40 - 1.50 mg/dL Final  07/23/2018 01:47 PM 1.24 0.40 - 1.50 mg/dL Final  01/22/2018 02:35 PM 1.05 0.40 - 1.50 mg/dL Final  09/05/2017 10:37 AM 1.13 0.76 - 1.27 mg/dL Final  07/24/2017 04:02 PM 1.12 0.40 - 1.50 mg/dL Final  02/26/2017 12:02 PM 1.10 0.40 - 1.50 mg/dL Final  11/08/2016 09:14 AM 1.23 0.40 - 1.50 mg/dL Final  05/10/2016 09:26 AM 1.04 0.40 - 1.50 mg/dL Final  01/12/2016 07:59 AM 1.31 0.40 - 1.50 mg/dL Final  08/31/2015 12:51 PM 1.11 0.40 - 1.50 mg/dL Final  62/77/2893 29:11 AM 1.12 0.40 - 1.50 mg/dL Final  52/45/8571 62:72 AM 1.22 0.40 - 1.50 mg/dL Final  69/13/1475 29:39 AM 1.1 0.4 - 1.5 mg/dL Final  70/10/5889 91:27 AM 1.2 0.4 - 1.5 mg/dL Final  10/41/4120 55:87 AM 1.0 0.4 - 1.5 mg/dL Final  97/84/9147 60:75 AM 1.1 0.4 - 1.5 mg/dL Final  97/89/6452 73:99 AM 1.0 0.4 - 1.5 mg/dL Final  59/16/1988 22:08 PM 1.1 0.4 - 1.5 mg/dL Final  86/85/5250 60:49 AM 1.0 0.4 - 1.5 mg/dL Final  33/19/9190 60:77 AM 1.1 0.4 - 1.5 mg/dL Final  91/44/9730 28:85 AM 0.99 0.50 - 1.35 mg/dL Final  56/36/3301 97:36 AM 1.0 0.4 - 1.5 mg/dL Final  14/92/9882 13:65 AM 1.0 0.4 - 1.5 mg/dL Final  36/64/0177 65:28 AM 1.0 0.4 - 1.5  mg/dL Final  59/18/6022 21:17 AM 0.9 0.4 - 1.5 mg/dL Final  09/48/4145 65:47 AM 1.0 0.4 - 1.5 mg/dL Final  28/25/0311 74:34 AM 1.0 0.4 - 1.5 mg/dL Final  36/20/5859 53:79 PM 1.20 0.4 - 1.5 mg/dL Final  62/67/0518 63:63 PM 1.0 0.4 - 1.5 mg/dL Final     PMHx:   Past Medical History:  Diagnosis Date  . Depression   . Fatty liver   . Glucose intolerance (impaired glucose tolerance)   . HOH (hard of hearing)    bilateral hearing aides  . Hx of colonic polyps   . Hyperlipidemia   . Hypertension   . Neuromuscular disorder (HCC)    neuropathy feet    Past Surgical History:  Procedure Laterality Date  . COLONOSCOPY  2006, 2009, 07/28/2011   2006 12 and 7 mm TVadenoma and adenoma 2009: 3 small adenomas 2012,:23mm rectal polyp  . COLONOSCOPY    . LAPAROSCOPY N/A 01/08/2021   Procedure: LAPAROSCOPY DIAGNOSTIC, exploratory laparotomy small bowel resection times two, placement abthera wound vac;  Surgeon: Gaynelle Adu, MD;  Location: [facility];  Service: General;  Laterality: N/A;  . LAPAROTOMY N/A 01/10/2021   Procedure: EXPLORATORY LAPAROTOMY SMALL BOWEL RE-ANASTOMOSIS X2;  Surgeon: Sheliah Hatch De Blanch, MD;  Location: [facility] OR;  Service: General;  Laterality: N/A;  ROOM 1 STARTING AT 09:00AM FOR 120 MIN  . ORIF FINGER FRACTURE  02/08/2012   Procedure: OPEN REDUCTION INTERNAL FIXATION (ORIF) METACARPAL (FINGER) FRACTURE;  Surgeon: Wyn Forster., MD;  Location: [facility];  Service: Orthopedics;  Laterality: Right;  right small finger middle phalanx  . SQUAMOUS CELL CARCINOMA EXCISION     on scalp and nose  . TIBIA FRACTURE SURGERY  2001   right with hardware  . VASECTOMY    . WRIST FRACTURE SURGERY  2002   right    Family Hx:  Family History  Problem Relation Age of Onset  . Arrhythmia Mother   . Heart attack Mother   . Heart disease Mother   . Heart failure Father   . Diabetes Father   . Coronary artery disease Other        1st degree relative<60  . Alcohol abuse  Other   . Diabetes Other   . Stomach cancer Sister   . Cancer Neg Hx   . Stroke Neg Hx   . Hyperlipidemia Neg Hx   . Hypertension Neg Hx   . Kidney disease Neg Hx     Social History:  reports that he quit smoking about 51 years ago. He has never used smokeless tobacco. He reports current alcohol use of about  6.0 standard drinks of alcohol per week. He reports that he does not use drugs.  Allergies:  Allergies  Allergen Reactions  . Amlodipine Swelling    Swelling in legs; no shortness of breath or tongue swelling.  Marland Kitchen Prevnar [Pneumococcal 13-Val Conj Vacc]     Joint pain, swelling    Medications: Prior to Admission medications   Medication Sig Start Date End Date Taking? Authorizing Provider  Accu-Chek Softclix Lancets lancets Use to check blood sugar daily. DX: E11.8 07/07/20  Yes Janith Lima, MD  Alcohol Swabs (B-D SINGLE USE SWABS REGULAR) PADS Inject 1 Act into the skin See admin instructions. 02/27/18  Yes Janith Lima, MD  atorvastatin (LIPITOR) 40 MG tablet TAKE 1 TABLET EVERY DAY 11/29/20  Yes Janith Lima, MD  B Complex-C-Folic Acid (SUPER B COMPLEX/FA/VIT C) TABS Take by mouth.   Yes [provider]  blood glucose meter kit and supplies KIT Dispense based on patient and insurance preference. Use to check to blood sugar daily. DX: E11.9 07/07/20  Yes Janith Lima, MD  chlorthalidone (HYGROTON) 25 MG tablet Take 1 tablet (25 mg total) by mouth daily. 11/15/20  Yes Janith Lima, MD  CREON [phone] units CPEP capsule TAKE 1 CAPSULE THREE TIMES DAILY WITH MEALS 09/18/20  Yes Janith Lima, MD  cyanocobalamin 1000 MCG tablet Take 1 tablet (1,000 mcg total) by mouth daily. 02/05/20  Yes Janith Lima, MD  glucose blood test strip Use to check to blood sugar daily. DX: E11.9 07/07/20  Yes Janith Lima, MD  loperamide (IMODIUM) 2 MG capsule Take by mouth as needed for diarrhea or loose stools.   Yes [provider]  metoprolol succinate  (TOPROL-XL) 25 MG 24 hr tablet Take 1 tablet (25 mg total) by mouth daily. 11/04/20  Yes Janith Lima, MD  MITIGARE 0.6 MG CAPS Take 1 capsule by mouth as needed.  06/26/20  Yes [provider]  olmesartan (BENICAR) 40 MG tablet Take 1 tablet (40 mg total) by mouth daily. 09/22/20  Yes Janith Lima, MD  tadalafil (CIALIS) 5 MG tablet Take 1 tablet (5 mg total) by mouth daily as needed for erectile dysfunction. 03/04/19  Yes Janith Lima, MD  allopurinol (ZYLOPRIM) 100 MG tablet TAKE 1 TABLET (100 MG TOTAL) BY MOUTH DAILY. 01/04/21   Janith Lima, MD    I have reviewed the patient's reported prior to admission and current medications.  Labs:  BMP Latest Ref Rng & Units 01/13/2021 01/12/2021 12/25/2020  Glucose 70 - 99 mg/dL 146(H) 148(H) 391(H)  BUN 8 - 23 mg/dL 178(H) 166(H) 138(H)  Creatinine 0.61 - 1.24 mg/dL 2.70(H) 2.62(H) 2.34(H)  BUN/Creat Ratio 10 - 24 - - -  Sodium 135 - 145 mmol/L 147(H) 149(H) 143  Potassium 3.5 - 5.1 mmol/L 5.0 6.2(H) 5.5(H)  Chloride 98 - 111 mmol/L 117(H) 117(H) 113(H)  CO2 22 - 32 mmol/L 21(L) 20(L) 20(L)  Calcium 8.9 - 10.3 mg/dL 6.9(L) 7.6(L) 7.4(L)    Urinalysis    Component Value Date/Time   COLORURINE YELLOW 02/03/2020 1356   APPEARANCEUR CLEAR 02/03/2020 1356   LABSPEC 1.020 02/03/2020 1356   PHURINE 6.5 02/03/2020 1356   GLUCOSEU NEGATIVE 02/03/2020 1356   HGBUR NEGATIVE 02/03/2020 [place] 02/03/2020 [place] 02/03/2020 [place] 08/16/2010 2219   UROBILINOGEN 0.2 02/03/2020 1356   NITRITE NEGATIVE 02/03/2020 [place] 02/03/2020 1356  ROS:  Unable to obtain 2/2 intubated   Physical Exam: Vitals:   01/13/21 0630 01/13/21 0800  BP: (!) 139/45 (!) 148/53  Pulse: 74 76  Resp: (!) 28 (!) 29  Temp: 98.78 F (37.1 C) 98.6 F (37 C)  SpO2: (!) 89% (!) 89%     General: elderly male in bed intubated HEENT: NCAT Neck: trachea midline  Heart: S1S2  no rub Lungs: coarse reduced breath sounds Abdomen: soft/ND/dressed Extremities: 2+ edema diffusely Skin: no rash on extremities exposed Neuro: no sedation currently running. Does not respond to commands GU: foley in place  Assessment/Plan:  # Acute kidney injury - Secondary to ATN; steroids with azotemia. Excellent urine output - Start HD - plan for 2/24 and 2/25 then assess needs daily.  Feel given still making good urine and BP ok would be easier to monitor for recovery with HD than CRRT - Appreciate critical care assistance with line  - lasix 80 mg IV once  - baseline Cr 1 - 1.3   # Acute hypoxic respiratory failure - ARDS 2/2 covid and complicated by Klebsiella HCAP  - optimize resp status with HD as well  - making excellent urine output  # Encephalopathy  - Secondary in part to uremia likely    # Ischemic bowel  - S/p ex lap - wound care per surgery/primary team   # Hypernatremia  - Free water deficit - D5 at 50 ml/hr x 12 hours - for HD   # HTN - controlled   # Anemia normocytic  - mild and not contributing much  Claudia Desanctis 01/13/2021, 10:51 AM

## 2021-01-13 NOTE — Progress Notes (Signed)
[place] Progress Note Patient Name: Thomas Chung DOB: [birth year] MRN: [id number]   Date of Service  01/13/2021  HPI/Events of Note  Patient with sub-optimal BP control.  eICU Interventions  PRN iv Labetalol interval shortened to Q 2 hours for better BP control.        Julieann Drummonds U Mellony Danziger 01/13/2021, 12:30 AM

## 2021-01-13 NOTE — Progress Notes (Signed)
Acute Care Surgery Service Progress Note:    Chief Complaint/Subjective: On vent, not sedated but doesn't do anything for except slightly wince when I press hard on his belly.  RN states no BM, but seems to be tolerating his TFs at 10cc/hr yesterday  Objective: Vital signs in last 24 hours: Temp:  [96.1 F (35.6 C)-101.66 F (38.7 C)] 98.6 F (37 C) (02/24 0800) Pulse Rate:  [66-122] 76 (02/24 0800) Resp:  [16-29] 29 (02/24 0800) BP: (126-190)/(45-77) 148/53 (02/24 0800) SpO2:  [89 %-93 %] 89 % (02/24 0800) FiO2 (%):  [50 %] 50 % (02/24 0800) Last BM Date: 01/03/21  Intake/Output from previous day: 02/23 0701 - 02/24 0700 In: 3421.3 [I.V.:3117.8; IV Piggyback:303.5] Out: 2725 [Urine:2625; Emesis/NG output:100] Intake/Output this shift: Total I/O In: 65 [I.V.:39.5; IV Piggyback:25.5] Out: -   PE: Abd: soft, ND, wound vac in place, hypoactive BS. Tolerating TFs at 10cc/hr.  Lab Results: CBC  Recent Labs    01/12/21 0524 01/13/21 0451  WBC 51.9* 65.9*  HGB 10.5* 11.2*  HCT 35.9* 35.4*  PLT 144* 115*   BMET Recent Labs    01/12/21 0911 01/13/21 0451  NA 149* 147*  K 6.2* 5.0  CL 117* 117*  CO2 20* 21*  GLUCOSE 148* 146*  BUN 166* 178*  CREATININE 2.62* 2.70*  CALCIUM 7.6* 6.9*   LFT Hepatic Function Latest Ref Rng & Units 01/13/2021 01/10/2021 01/06/2021  Total Protein 6.5 - 8.1 g/dL <3.0(L) 4.6(L) 5.4(L)  Albumin 3.5 - 5.0 g/dL 1.3(L) 2.4(L) 2.7(L)  AST 15 - 41 U/L 47(H) 33 28  ALT 0 - 44 U/L 27 40 43  Alk Phosphatase 38 - 126 U/L 68 106 138(H)  Total Bilirubin 0.3 - 1.2 mg/dL 1.0 0.9 1.1  Bilirubin, Direct 0.0 - 0.3 mg/dL - - -   PT/INR No results for input(s): LABPROT, INR in the last 72 hours. ABG Recent Labs    01/10/21 1126  PHART 7.341*  HCO3 21.3    Studies/Results:  Anti-infectives: Anti-infectives (From admission, onward)   Start     Dose/Rate Route Frequency Ordered Stop   12/25/2020 0800  vancomycin (VANCOREADY) IVPB 1500 mg/300  mL  Status:  Discontinued        1,500 mg 150 mL/hr over 120 Minutes Intravenous Every 48 hours 12/26/2020 0702 01/10/21 1421   01/07/21 2200  piperacillin-tazobactam (ZOSYN) IVPB 3.375 g  Status:  Discontinued       Note to Pharmacy: Pharmacy may modify dose for renal dysfunction. Pharmacy to manage and monitor antibiotic administration.   3.375 g 100 mL/hr over 30 Minutes Intravenous Every 8 hours 01/07/21 2014 01/07/21 2017   01/07/21 2200  piperacillin-tazobactam (ZOSYN) IVPB 3.375 g        3.375 g 12.5 mL/hr over 240 Minutes Intravenous Every 8 hours 01/07/21 2018     01/07/21 1030  vancomycin (VANCOREADY) IVPB 2000 mg/400 mL        2,000 mg 200 mL/hr over 120 Minutes Intravenous  Once 01/07/21 0929 01/07/21 1327   01/07/21 1000  ceFEPIme (MAXIPIME) 2 g in sodium chloride 0.9 % 100 mL IVPB  Status:  Discontinued        2 g 200 mL/hr over 30 Minutes Intravenous Every 12 hours 01/07/21 0928 01/07/21 2010   01/07/21 0930  vancomycin variable dose per unstable renal function (pharmacist dosing)  Status:  Discontinued         Does not apply See admin instructions 01/07/21 0930 12/29/2020 0814   01/02/21 1000  remdesivir 100 mg in sodium chloride 0.9 % 100 mL IVPB       "Followed by" Linked Group Details   100 mg 200 mL/hr over 30 Minutes Intravenous Daily 12/28/2020 1845 01/05/21 0921   01/13/2021 2000  remdesivir 200 mg in sodium chloride 0.9% 250 mL IVPB       "Followed by" Linked Group Details   200 mg 580 mL/hr over 30 Minutes Intravenous Once 01/02/2021 1845 12/26/2020 2017      Medications: Scheduled Meds: . chlorhexidine gluconate (MEDLINE KIT)  15 mL Mouth Rinse BID  . Chlorhexidine Gluconate Cloth  6 each Topical Daily  . enoxaparin (LOVENOX) injection  30 mg Subcutaneous Q24H  . feeding supplement (PIVOT 1.5 CAL)  1,000 mL Per Tube Q24H  . insulin aspart  0-20 Units Subcutaneous Q4H  . insulin aspart  3 Units Subcutaneous Q4H  . mouth rinse  15 mL Mouth Rinse 10 times per day  .  mupirocin ointment  1 application Nasal BID  . pantoprazole (PROTONIX) IV  40 mg Intravenous Q24H  . sodium chloride flush  10-40 mL Intracatheter Q12H   Continuous Infusions: . dexmedetomidine (PRECEDEX) IV infusion Stopped (01/12/21 1317)  . fentaNYL infusion INTRAVENOUS 175 mcg/hr (01/13/21 0900)  . levETIRAcetam 750 mg (01/13/21 0910)  . piperacillin-tazobactam (ZOSYN)  IV 12.5 mL/hr at 01/13/21 0900  . TPN ADULT (ION) 40 mL/hr at 01/13/21 0657   PRN Meds:.acetaminophen, fentaNYL, fentaNYL (SUBLIMAZE) injection, hydrALAZINE, ipratropium-albuterol, labetalol, midazolam, sodium chloride flush  Assessment/Plan: Patient Active Problem List   Diagnosis Date Noted  . ARDS (adult respiratory distress syndrome) ([place])   . Ischemic bowel disease ([place])   . Bacteremia   . Severe sepsis without septic shock ([place])   . Acute respiratory disease due to COVID-19 virus 01/02/2021  . Elevated serum creatinine 01/02/2021  . Pneumonia due to COVID-19 virus 01/07/2021  . COVID-19 12/27/2020  . Gout 08/05/2020  . Need for hepatitis C screening test 08/05/2020  . Onychomycosis of multiple toenails with type 2 diabetes mellitus ([place]) 08/06/2019  . 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 B12 deficiency 10/31/2018  . Thrombocytosis 10/29/2018  . GERD without esophagitis 01/22/2018  . Asymptomatic gallstones 09/09/2015  . Routine general medical examination at a health care facility 02/10/2015  . Prostate nodule with urinary obstruction 07/17/2011  . Type II diabetes mellitus with manifestations ([place]) 08/05/2009  . Diabetic neuropathy, painful ([place]) 07/06/2009  . Hyperlipidemia with target LDL less than 100 04/22/2009  . Essential hypertension, benign 04/22/2009  . Erectile dysfunction associated with type 2 diabetes mellitus ([place]) 04/21/2009   COVID pneumonia withVDRF with likely HCAP as well -intubated, per CCM -solumedrol/remdexivir/tocilizumab Worsening renal  failure-2.7 Peripheral vascular compromise- hypoperfusion noted  Pancreaticinsufficiency- On Creon at home, currently on hold Type 2 diabetes Hypertension Hyperlipidemia Gout Hx B12 deficiency 20 pack yr hx of tobacco use, none since age 53 Clastridium Cadaveris bacteremia - per medicine Leukocytosis - up to 65K today.  Abdominal exam is benign currently.  No evidence for ischemia on Tuesday  POD 5, s/p ex lap with SBR x2 for ischemic bowel, open abdomen, Dr. Redmond Pulling 2/20, POD 2, s/p reconnection and abdominal closure, Dr. Kieth Brightly 2/22 -patient returned to continuity. -increase to 20cc/hr today -no further ischemia noted yesterday in OR.  WBC elevation likely not secondary to intra-abdominal source given no evidence for ischemia or infection -change wound VAC on Friday to get on a MWF schedule.  FEN: IVFs/TNA/NGT/TFs at 20cc/hr VTE: Lovenox ID: [email or web address]  Henreitta Cea ,  [facility] 01/10/2021, 9:34 AM  Please see Amion for pager number during day hours 7:00am-4:30pm or 7:00am -11:30am on weekends

## 2021-01-13 NOTE — Progress Notes (Signed)
PHARMACY - TOTAL PARENTERAL NUTRITION CONSULT NOTE   Indication: Prolonged ileus  Patient Measurements: Height: 6\' 4"  (193 cm) Weight: 115.4 kg (254 lb 6.6 oz) IBW/kg (Calculated) : 86.8 TPN AdjBW (KG): 93.7 Body mass index is 30.97 kg/m. Usual Weight: 120 kg on 11/04/20  Assessment:  80 yo male with hx DM2, neuropathy, pancreatic insufficiency, hypertension, GERD who required intubation 2/17 for COVID pneumonia.  He developed lactic acidosis concerning for peritonitis, CT showed possible enteritis/ischemia.  On 2/20, developed worsening lactic acidosis concerning for bowel ischemia and is now s/p small bowel resection times two, placement abthera wound vac.  Pharmacy consulted for TPN.  Glucose / Insulin: Hx DM2. A1c 6.7 on 11/04/20. Started on insulin gtt 2/22 AM till 2/24 AM. Started on levemir 10 units q12h, 3 units q4h and sensitive SSI per CCM. Used ~57 units since new TPN started at 1800 with 15 units of insulin added to TPN.  - Methylprednisolone 60mg  BID to 40mg  daily on 2/21 & discontinued on 2/23. LD 2/23 AM.  Electrolytes: Na 147. K 5.0 (lokelma 10g x3). Cl 117. Corrected Ca 9.06. Phos 5.2. Mg 3.6  Renal: AKI. Scr 2.7 (BL ~1.1) BUN 178 - increasing. UOP 0.9  Ml/kg/hr - increase. LFTs / TGs: AlkPhos 68, AST/ALT 47/27, TG 567 (propofol stopped) Prealbumin / albumin: Prealbumin 22.1; Albumin 1.3 Intake / Output: UOP 0.9 ml/kg/hr, NG 100 mL / 24 hrs, Drain 0 ml / 24 hrs MIVF: LR @ 75 ml/hr - discontinued 2/24 AM GI Imaging: 2/18 CT abd: segmental enteritis in right lower quadrant concerning for early ischemia. No bowel obstruction. Surgeries / Procedures:  2/20: ex-lap with small bowel resection x 2 2/22: reopening of ex-lap, anastomosis of small intestine x2  Central access: PICC 2/20 TPN start date: 2/21  Nutritional Goals (RD recommendations 2/18): KCal: 3007-6226 , >/= 150g Protein: , Fluid: >/= 3 L/day Goal TPN rate is 100 mL/hr provides 151 g of protein/day -   Average 2033 Kcal with lipids only MWF (20g/L) due to national shortage  Current Nutrition:  TPN TF - resumed at 73ml/hr per surgery on 2/23; may increase to 20 ml/hr per surgery today  Plan:  Increase TPN to 60 mL/hr - this provides 1097 kcal and 91g of protein Hold lipids for hypertriglyceridemia - ordered TG for Friday morning Electrolytes in TPN (no changes today): Continue 0 mEq/L of Na, 0 mEq/L of K, 0 mmol/L of Phos, 0 mEq/L Mg, 5 mEq/L of Ca. Max acetate.  Discussed with CCM - will stop levemir, continue scheduled 3 units q4h and adjust SSI to resistant Increase insulin in TPN to 40 units  Add standard MVI and trace elements to TPN TPN labs Monday/Thursday, repeat electrolytes tomorrow  Follow up advancement of tube feeds   Cristela Felt, PharmD Clinical Pharmacist  01/13/2021, 8:32 AM

## 2021-01-13 NOTE — Progress Notes (Signed)
[place] Progress Note Patient Name: Thomas Chung DOB: [birth year] MRN: [id number]   Date of Service  01/13/2021  HPI/Events of Note  Patient with normal anion gap and controlled blood sugar levels on 2 units of Insulin infusion.  eICU Interventions  Insulin transition orders entered per protocol.         Kerry Kass Semaj Coburn 01/13/2021, 5:11 AM

## 2021-01-13 NOTE — Progress Notes (Signed)
ETT holder was changed with the assistance of CCM. No complications were noted.

## 2021-01-14 ENCOUNTER — Inpatient Hospital Stay (HOSPITAL_COMMUNITY): Payer: Medicare HMO

## 2021-01-14 DIAGNOSIS — J069 Acute upper respiratory infection, unspecified: Secondary | ICD-10-CM | POA: Diagnosis not present

## 2021-01-14 DIAGNOSIS — R7881 Bacteremia: Secondary | ICD-10-CM | POA: Diagnosis not present

## 2021-01-14 DIAGNOSIS — U071 COVID-19: Secondary | ICD-10-CM | POA: Diagnosis not present

## 2021-01-14 DIAGNOSIS — J1282 Pneumonia due to coronavirus disease 2019: Secondary | ICD-10-CM | POA: Diagnosis not present

## 2021-01-14 DIAGNOSIS — A414 Sepsis due to anaerobes: Secondary | ICD-10-CM | POA: Diagnosis not present

## 2021-01-14 DIAGNOSIS — D72829 Elevated white blood cell count, unspecified: Secondary | ICD-10-CM | POA: Diagnosis not present

## 2021-01-14 LAB — BASIC METABOLIC PANEL
Anion gap: 12 (ref 5–15)
BUN: 153 mg/dL — ABNORMAL HIGH (ref 8–23)
CO2: 26 mmol/L (ref 22–32)
Calcium: 7.8 mg/dL — ABNORMAL LOW (ref 8.9–10.3)
Chloride: 114 mmol/L — ABNORMAL HIGH (ref 98–111)
Creatinine, Ser: 2.75 mg/dL — ABNORMAL HIGH (ref 0.61–1.24)
GFR, Estimated: 23 mL/min — ABNORMAL LOW (ref >60)
Glucose, Bld: 174 mg/dL — ABNORMAL HIGH (ref 70–99)
Potassium: 4.5 mmol/L (ref 3.5–5.1)
Sodium: 152 mmol/L — ABNORMAL HIGH (ref 135–145)

## 2021-01-14 LAB — GLUCOSE, CAPILLARY
Glucose-Capillary: 149 mg/dL — ABNORMAL HIGH (ref 70–99)
Glucose-Capillary: 156 mg/dL — ABNORMAL HIGH (ref 70–99)
Glucose-Capillary: 164 mg/dL — ABNORMAL HIGH (ref 70–99)
Glucose-Capillary: 185 mg/dL — ABNORMAL HIGH (ref 70–99)
Glucose-Capillary: 196 mg/dL — ABNORMAL HIGH (ref 70–99)
Glucose-Capillary: 211 mg/dL — ABNORMAL HIGH (ref 70–99)

## 2021-01-14 LAB — FERRITIN: Ferritin: 323 ng/mL (ref 24–336)

## 2021-01-14 LAB — CULTURE, BLOOD (ROUTINE X 2): Special Requests: ADEQUATE

## 2021-01-14 LAB — CBC
HCT: 33.8 % — ABNORMAL LOW (ref 39.0–52.0)
Hemoglobin: 9.9 g/dL — ABNORMAL LOW (ref 13.0–17.0)
MCH: 25.7 pg — ABNORMAL LOW (ref 26.0–34.0)
MCHC: 29.3 g/dL — ABNORMAL LOW (ref 30.0–36.0)
MCV: 87.8 fL (ref 80.0–100.0)
Platelets: 81 10*3/uL — ABNORMAL LOW (ref 150–400)
RBC: 3.85 MIL/uL — ABNORMAL LOW (ref 4.22–5.81)
RDW: 18.2 % — ABNORMAL HIGH (ref 11.5–15.5)
WBC: 52.7 10*3/uL (ref 4.0–10.5)
nRBC: 11.1 % — ABNORMAL HIGH (ref 0.0–0.2)

## 2021-01-14 LAB — TRIGLYCERIDES: Triglycerides: 220 mg/dL — ABNORMAL HIGH (ref <150)

## 2021-01-14 LAB — MAGNESIUM: Magnesium: 3.3 mg/dL — ABNORMAL HIGH (ref 1.7–2.4)

## 2021-01-14 LAB — BRAIN NATRIURETIC PEPTIDE: B Natriuretic Peptide: 1000.1 pg/mL — ABNORMAL HIGH (ref 0.0–100.0)

## 2021-01-14 LAB — IRON AND TIBC
Iron: 46 ug/dL (ref 45–182)
Saturation Ratios: 17 % — ABNORMAL LOW (ref 17.9–39.5)
TIBC: 273 ug/dL (ref 250–450)
UIBC: 227 ug/dL

## 2021-01-14 LAB — PHOSPHORUS: Phosphorus: 7 mg/dL — ABNORMAL HIGH (ref 2.5–4.6)

## 2021-01-14 MED ORDER — PIPERACILLIN-TAZOBACTAM 3.375 G IVPB
3.3750 g | Freq: Two times a day (BID) | INTRAVENOUS | Status: DC
  Administered 2021-01-14 – 2021-01-15 (×2): 3.375 g via INTRAVENOUS
  Filled 2021-01-14 (×2): qty 50

## 2021-01-14 MED ORDER — TRACE MINERALS CU-MN-SE-ZN 300-55-60-3000 MCG/ML IV SOLN
INTRAVENOUS | Status: DC
  Filled 2021-01-14: qty 672

## 2021-01-14 MED ORDER — DEXTROSE 5 % IV SOLN: INTRAVENOUS | Status: DC

## 2021-01-14 MED ORDER — MORPHINE SULFATE (PF) 2 MG/ML IV SOLN: 1.0000 mg | INTRAVENOUS | Status: DC | PRN

## 2021-01-14 MED ORDER — ALBUMIN HUMAN 25 % IV SOLN
25.0000 g | Freq: Once | INTRAVENOUS | Status: AC
  Administered 2021-01-14: 25 g via INTRAVENOUS
  Filled 2021-01-14: qty 100

## 2021-01-14 MED ORDER — CHLORHEXIDINE GLUCONATE CLOTH 2 % EX PADS
6.0000 | MEDICATED_PAD | Freq: Every day | CUTANEOUS | Status: DC
  Administered 2021-01-14 – 2021-01-15 (×2): 6 via TOPICAL

## 2021-01-14 MED ORDER — FUROSEMIDE 10 MG/ML IJ SOLN
40.0000 mg | Freq: Once | INTRAMUSCULAR | Status: AC
  Administered 2021-01-14: 40 mg via INTRAVENOUS
  Filled 2021-01-14: qty 4

## 2021-01-14 MED ORDER — PIPERACILLIN-TAZOBACTAM 3.375 G IVPB: 3.3750 g | Freq: Two times a day (BID) | INTRAVENOUS | Status: DC

## 2021-01-14 MED ORDER — FUROSEMIDE 10 MG/ML IJ SOLN: 40.0000 mg | Freq: Two times a day (BID) | INTRAMUSCULAR | Status: DC

## 2021-01-14 NOTE — Progress Notes (Addendum)
PHARMACY - TOTAL PARENTERAL NUTRITION CONSULT NOTE   Indication: Prolonged ileus  Patient Measurements: Height: 6\' 4"  (193 cm) Weight: 115.4 kg (254 lb 6.6 oz) IBW/kg (Calculated) : 86.8 TPN AdjBW (KG): 93.7 Body mass index is 30.97 kg/m. Usual Weight: 120 kg on 11/04/20  Assessment:  80 yo male with hx DM2, neuropathy, pancreatic insufficiency, hypertension, GERD who required intubation 2/17 for COVID pneumonia.  He developed lactic acidosis concerning for peritonitis, CT showed possible enteritis/ischemia.  On 2/20, developed worsening lactic acidosis concerning for bowel ischemia and is now s/p small bowel resection times two, placement abthera wound vac.  Pharmacy consulted for TPN.  Glucose / Insulin: Hx DM2. A1c 6.7 on 11/04/20. CBGs 164 - 262 since TPN with 40 units insulin started. D5W off at ~2300. On 3 units q4h and rSSI q4h. Utilized 54 units / 24 hrs.  - Started on D5W @ 50 ml/hr  x12 hrs per nephrology on 2/24 for hypernatremia. Resumed D5W @ 60 ml/hr per nephrology 2/25 AM.  - Insulin gtt 2/22 AM till 2/24 AM - Methylprednisolone 60mg  BID to 40mg  daily on 2/21 & discontinued on 2/23. LD 2/23 AM.  Electrolytes: Na 152 (on D5W). K down to 4.5 (iHD + furosemide x1). Cl 114. Phos 7.0. Mg 3.3. Corr Ca 9.96.  Renal: AKI. Scr 2.75 (BL ~1.1) BUN 153 - decrease. UOP 1.1  ml/kg/hr - increase. - iHD 2/24. Plan for iHD 2/25 (possibly 2/26 due to staffing) then assessing needs daily.  LFTs / TGs: AlkPhos 68, AST/ALT 47/27, TG 567 (propofol stopped) > 220 Prealbumin / albumin: Prealbumin 22.1; Albumin 1.3 Intake / Output: NG 0 mL / 24 hrs, Drain 0 ml / 24 hrs MIVF: D5W @ 60 ml/hr per nephology for hypernatremia GI Imaging: 2/18 CT abd: segmental enteritis in right lower quadrant concerning for early ischemia. No bowel obstruction. Surgeries / Procedures:  2/20: ex-lap with small bowel resection x 2 2/22: reopening of ex-lap, anastomosis of small intestine x2  Central access: PICC  2/20 TPN start date: 2/21  Nutritional Goals (RD recommendations 2/23): KCal: 9924-2683 , 165-180g Protein: , Fluid: >/= 2 L/day Goal TPN rate is 100 mL/hr provides 168 g of protein/day and 21 g/L of lipids on MWF - Average 2520 kcal /day using slightly reduced dextrose concentration (due to additional dextrose infusion) of 20% instead of 23% which would provide an average of 2765 kcal/day  Current Nutrition:  TPN TF - resumed at 31ml/hr per surgery on 2/23; increased to 20 ml/hr on 2/24 then off overnight due to vomiting per RN Cortrak to be placed 2/25  Plan:  Continue new concentrated TPN at 60 ml/hr (adjusted for new kcal and AA goals) - not advancing due to hyperglycemia and additional 72g / 24 hrs from D5W at current rate. This provides 101g and 1684 kcal - increase from yesterday.  Start 21 g/L of lipids today per request of dietary as necessary and TG have decreased with propofol off  Electrolytes in TPN (no changes today): Continue 0 mEq/L of Na, 0 mEq/L of K, 0 mmol/L of Phos, 0 mEq/L Mg, 5 mEq/L of Ca. Max acetate. Continue D5W per nephrology for hypernatremia - discussed with nephrology and Surgery and okay to start free water 100 q4h when Cortrak placed and usable and continue D5W at the same rate  Continue scheduled 3 units q4h, rSSI q4h and increase insulin in TPN to 60 units  Add standard MVI and trace elements to TPN - removed chromium TPN labs Monday/Thursday, repeat  electrolytes tomorrow  Follow up resuming and advancement of tube feeds with placement of Cortrak today  Cristela Felt, PharmD Clinical Pharmacist  01/14/2021, 7:02 AM   Addendum: [place] discussion with family and CCM today. Waiting on cortrak placement till after discussion. Will follow up and order free water when cortrak placed.   Cristela Felt, PharmD Clinical Pharmacist

## 2021-01-14 NOTE — Progress Notes (Addendum)
[facility] Chung for Infectious Disease    Date of Admission:  01/17/2021   Total days of antibiotics 8/piptazo        ID: Thomas Chung is a 80 y.o. male with  Principal Problem:   Acute respiratory disease due to COVID-19 virus Active Problems:   Type II diabetes mellitus with manifestations ([place])   Hyperlipidemia with target LDL less than 100   Essential hypertension, benign   Exocrine pancreatic insufficiency   Pneumonia due to COVID-19 virus   Elevated serum creatinine   ARDS (adult respiratory distress syndrome) (HCC)   Ischemic bowel disease (HCC)   Bacteremia   Severe sepsis without septic shock (HCC)    Subjective: Underwent HD;    Leukocytosis improved 66 to 53K  Medications:  . amiodarone  150 mg Intravenous Once  . chlorhexidine gluconate (MEDLINE KIT)  15 mL Mouth Rinse BID  . Chlorhexidine Gluconate Cloth  6 each Topical Daily  . Chlorhexidine Gluconate Cloth  6 each Topical Q0600  . Chlorhexidine Gluconate Cloth  6 each Topical Q0600  . enoxaparin (LOVENOX) injection  30 mg Subcutaneous Q24H  . feeding supplement (PIVOT 1.5 CAL)  1,000 mL Per Tube Q24H  . insulin aspart  0-20 Units Subcutaneous Q4H  . insulin aspart  3 Units Subcutaneous Q4H  . mouth rinse  15 mL Mouth Rinse 10 times per day  . mupirocin ointment  1 application Nasal BID  . pantoprazole (PROTONIX) IV  40 mg Intravenous Q24H  . sodium chloride flush  10-40 mL Intracatheter Q12H    Objective: Vital signs in last 24 hours: Temp:  [97.7 F (36.5 C)-99.9 F (37.7 C)] 98.42 F (36.9 C) (02/25 0700) Pulse Rate:  [26-103] 88 (02/25 0700) Resp:  [16-30] 27 (02/25 0805) BP: (67-163)/(42-63) 106/53 (02/25 0805) SpO2:  [83 %-99 %] 94 % (02/25 0805) FiO2 (%):  [60 %-100 %] 70 % (02/25 0805) Weight:  [115.4 kg] 115.4 kg (02/25 0400)  Physical Exam  Constitutional: intubated. He appears well-developed and well-nourished. No distress.  HENT:  Mouth/Throat: Oropharynx is clear and moist.  No oropharyngeal exudate.  Cardiovascular: Normal rate, regular rhythm and normal heart sounds. Exam reveals no gallop and no friction rub.  No murmur heard.  Pulmonary/Chest: Effort normal and breath sounds normal.  Abd: wound vac in place, soft bowel sounds, not distended  Neurological: He is alert and oriented to person, place, and time.  Skin: Skin is warm and dry. No rash noted. No erythema.  Ext: anasarca  Lab Results Recent Labs    01/13/21 0451 01/14/21 0336  WBC 65.9* 52.7*  HGB 11.2* 9.9*  HCT 35.4* 33.8*  NA 147* 152*  K 5.0 4.5  CL 117* 114*  CO2 21* 26  BUN 178* 153*  CREATININE 2.70* 2.75*   Liver Panel Recent Labs    01/13/21 0451  PROT <3.0*  ALBUMIN 1.3*  AST 47*  ALT 27  ALKPHOS 68  BILITOT 1.0    Microbiology: 2/18 blood cx clostridium cadavaris 2/17 respiratory cx Klebsiella oxytoca      MIC    AMPICILLIN >=32 RESIST... Resistant    AMPICILLIN/SULBACTAM 8 SENSITIVE  Sensitive    CEFAZOLIN 16 SENSITIVE  Sensitive    CEFEPIME <=0.12 SENS... Sensitive    CEFTAZIDIME <=1 SENSITIVE  Sensitive    CEFTRIAXONE <=0.25 SENS... Sensitive    CIPROFLOXACIN <=0.25 SENS... Sensitive    GENTAMICIN <=1 SENSITIVE  Sensitive    IMIPENEM <=0.25 SENS... Sensitive    PIP/TAZO <=4  SENSITIVE  Sensitive    TRIMETH/SULFA <=20 SENSIT... Sensitive     Studies/Results: DG Chest Port 1 View  Result Date: 01/14/2021 CLINICAL DATA:  Acute respiratory failure.  Coronavirus infection. EXAM: PORTABLE CHEST 1 VIEW COMPARISON:  01/13/2021 FINDINGS: Endotracheal tube tip 5 cm above the carina. Orogastric or nasogastric tube enters the abdomen. Right arm PICC tip at the SVC RA junction. Right internal jugular catheter in the SVC above the right atrium. Widespread patchy pulmonary infiltrates persist. No worsening or new finding. IMPRESSION: Endotracheal tube tip 5 cm above the carina. Other lines and tubes well positioned. Widespread patchy pulmonary infiltrates persist. No  worsening or new finding. Electronically Signed   By: Thomas Chung M.D.   On: 01/14/2021 03:40   DG CHEST PORT 1 VIEW  Result Date: 01/13/2021 CLINICAL DATA:  Central line placement. COVID-19 viral pneumonia. Endotracheally intubated. EXAM: PORTABLE CHEST 1 VIEW COMPARISON:  01/10/2021 FINDINGS: A new right jugular central venous catheter is seen with tip overlying the distal SVC. Endotracheal tube, nasogastric tube, and right arm PICC line remain in appropriate position. No evidence of pneumothorax. Worsening pulmonary airspace disease is seen in the peripheral and lower lung zones bilaterally. Heart size is stable. No evidence of pleural effusion. IMPRESSION: New right jugular central venous catheter in appropriate position. No evidence of pneumothorax. Worsening bilateral pulmonary airspace disease. Electronically Signed   By: Thomas Chung M.D.   On: 01/13/2021 14:21     Assessment/Plan: Clostridial bacteremia/sepsis for ischemic bowel = recommend to treat for total of 14 days with piptazo  Klebsiella HCAP = covered by pip/tazo as well.  Leukocytosis = improving. Continue with treatment plan  Hx of covid-19 pneumonia = received remdesivir, steroids; for infection control: recommend 20 day isolation  Will sign off. Call if questions.  Thomas Chung for Infectious Diseases Cell: [phone] Pager: [phone]  01/14/2021, 8:58 AM

## 2021-01-14 NOTE — Progress Notes (Signed)
[facility] Progress Note Patient Name: Thomas Chung DOB: 08-16-1941 MRN: [id number]   Date of Service  01/14/2021  HPI/Events of Note  Patient with marginal blood pressures, serum albumin on last check is 1.3, per RN he appeared to go into atrial fibrillation.  eICU Interventions  Albumin 25 % 25 gm iv x 1, stat 12 lead EKG ordered.        Kerry Kass Thomas Chung 01/14/2021, 1:43 AM

## 2021-01-14 NOTE — Progress Notes (Signed)
Pharmacy Antibiotic Note  **Renal Dose adjustment required**   Thomas Chung is a 80 y.o. male admitted on 01/08/2021 with ARDS secondary to COVID-19. 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  Pharmacy has been consulted for Zosyn dosing.  Plan: - STOP Zosyn 3.375 g Q8H  - START Zosyn 3.375 g Q12H - CHANGE infusion to extend over 4H- patient is starting HD  Height: 6\' 4"  (193 cm) Weight: 115.4 kg (254 lb 6.6 oz) IBW/kg (Calculated) : 86.8  Temp (24hrs), Avg:98.9 F (37.2 C), Min:97.7 F (36.5 C), Max:99.9 F (37.7 C)  Recent Labs  Lab 01/08/21 0908 01/08/21 1134 01/08/21 1445 01/08/21 2052 01/08/2021 0300 01/07/2021 0315 01/14/2021 0535 01/12/2021 1115 01/10/21 0323 01/10/21 1155 01/10/21 1413 12/28/2020 0354 01/12/21 0524 01/12/21 0911 01/13/21 0451 01/14/21 0336  WBC  --   --   --   --   --   --    < >  --  41.6*  --   --  51.6* 51.9*  --  65.9* 52.7*  CREATININE  --    < >  --   --   --  2.98*  --   --  2.75*   < > 2.55* 2.34*  --  2.62* 2.70* 2.75*  LATICACIDVEN 3.1*  --  2.9* 3.0* 3.6*  --   --  2.2*  --   --   --   --   --   --   --   --   VANCORANDOM  --   --   --   --   --  11  --   --   --   --   --   --   --   --   --   --    < > = values in this interval not displayed.    Estimated Creatinine Clearance: 30.3 mL/min (A) (by C-G formula based on SCr of 2.75 mg/dL (H)).    Allergies  Allergen Reactions   Amlodipine Swelling    Swelling in legs; no shortness of breath or tongue swelling.   Prevnar [Pneumococcal 13-Val Conj Vacc]     Joint pain, swelling    Antimicrobials this admission: Zosyn 2/18-c  Vancomycin 2/18, 2/20   Microbiology results: BCx: 2/18 Clostridium cadaveris  Tracheal aspirate: 2/17 abundant Klebsiella oxytoca   Thank you for allowing pharmacy to be a part of this patients care.  Adria Dill, PharmD-Candidate  01/14/2021 9:03 AM  Infectious Diseases Consult Service

## 2021-01-14 NOTE — Progress Notes (Signed)
Cortrak Tube Team Note:  Consult received to place a Cortrak feeding tube.   RD arrived on unit to place Cortrak tube; Discussed pt with CCM team. Plan is to hold off on Cortrak placement for now pending further [place] discussions.   Please re-consult Cortrak Team if tube placement desired  If the tube becomes dislodged please keep the tube and contact the [place] team at [email or web address] (password [email or web address]) for replacement.  If after hours and replacement cannot be delayed, place a NG tube and confirm placement with an abdominal x-ray.   Kerman Passey MS, RDN, LDN, CNSC Registered Dietitian III Clinical Nutrition RD Pager and On-Call Pager Number Located in [place]

## 2021-01-14 NOTE — Progress Notes (Addendum)
Acute Care Surgery Service Progress Note:    Chief Complaint/Subjective: Vomited overnight.  NGT returned to [facility] with no output this morning.  Still unresponsive off sedation today.  Got some dialysis overnight, but became hypotensive.  Was given some albumin.  Attempt more today per renal.   Objective: Vital signs in last 24 hours: Temp:  [97.7 F (36.5 C)-99.9 F (37.7 C)] 98.42 F (36.9 C) (02/25 0800) Pulse Rate:  [26-103] 93 (02/25 0800) Resp:  [16-30] 27 (02/25 0805) BP: (67-163)/(42-63) 106/53 (02/25 0805) SpO2:  [83 %-99 %] 94 % (02/25 0805) FiO2 (%):  [60 %-100 %] 70 % (02/25 0805) Weight:  [115.4 kg] 115.4 kg (02/25 0400) Last BM Date: 01/03/21  Intake/Output from previous day: 02/24 0701 - 02/25 0700 In: 3095.5 [I.V.:2601.4; NG/GT:100; IV Piggyback:394.2] Out: 2575 [Urine:2975] Intake/Output this shift: Total I/O In: 298.4 [I.V.:273.3; IV Piggyback:25.1] Out: -   PE: Abd: soft, ND, wound vac in place, hypoactive BS. NGT with no output currently  Lab Results: CBC  Recent Labs    01/13/21 0451 01/14/21 0336  WBC 65.9* 52.7*  HGB 11.2* 9.9*  HCT 35.4* 33.8*  PLT 115* 81*   BMET Recent Labs    01/13/21 0451 01/14/21 0336  NA 147* 152*  K 5.0 4.5  CL 117* 114*  CO2 21* 26  GLUCOSE 146* 174*  BUN 178* 153*  CREATININE 2.70* 2.75*  CALCIUM 6.9* 7.8*   LFT Hepatic Function Latest Ref Rng & Units 01/13/2021 01/10/2021 12/21/2020  Total Protein 6.5 - 8.1 g/dL <3.0(L) 4.6(L) 5.4(L)  Albumin 3.5 - 5.0 g/dL 1.3(L) 2.4(L) 2.7(L)  AST 15 - 41 U/L 47(H) 33 28  ALT 0 - 44 U/L 27 40 43  Alk Phosphatase 38 - 126 U/L 68 106 138(H)  Total Bilirubin 0.3 - 1.2 mg/dL 1.0 0.9 1.1  Bilirubin, Direct 0.0 - 0.3 mg/dL - - -   PT/INR No results for input(s): LABPROT, INR in the last 72 hours. ABG No results for input(s): PHART, HCO3 in the last 72 hours.  Invalid input(s): PCO2, PO2  Studies/Results:  Anti-infectives: Anti-infectives (From admission, onward)    Start     Dose/Rate Route Frequency Ordered Stop   01/14/21 2200  piperacillin-tazobactam (ZOSYN) IVPB 3.375 g        3.375 g 12.5 mL/hr over 240 Minutes Intravenous Every 12 hours 01/14/21 0828     01/08/2021 0800  vancomycin (VANCOREADY) IVPB 1500 mg/300 mL  Status:  Discontinued        1,500 mg 150 mL/hr over 120 Minutes Intravenous Every 48 hours 01/01/2021 0702 01/10/21 1421   01/07/21 2200  piperacillin-tazobactam (ZOSYN) IVPB 3.375 g  Status:  Discontinued       Note to Pharmacy: Pharmacy may modify dose for renal dysfunction. Pharmacy to manage and monitor antibiotic administration.   3.375 g 100 mL/hr over 30 Minutes Intravenous Every 8 hours 01/07/21 2014 01/07/21 2017   01/07/21 2200  piperacillin-tazobactam (ZOSYN) IVPB 3.375 g  Status:  Discontinued        3.375 g 12.5 mL/hr over 240 Minutes Intravenous Every 8 hours 01/07/21 2018 01/14/21 0828   01/07/21 1030  vancomycin (VANCOREADY) IVPB 2000 mg/400 mL        2,000 mg 200 mL/hr over 120 Minutes Intravenous  Once 01/07/21 0929 01/07/21 1327   01/07/21 1000  ceFEPIme (MAXIPIME) 2 g in sodium chloride 0.9 % 100 mL IVPB  Status:  Discontinued        2 g 200 mL/hr over  30 Minutes Intravenous Every 12 hours 01/07/21 0928 01/07/21 2010   01/07/21 0930  vancomycin variable dose per unstable renal function (pharmacist dosing)  Status:  Discontinued         Does not apply See admin instructions 01/07/21 0930 01/16/2021 0814   01/02/21 1000  remdesivir 100 mg in sodium chloride 0.9 % 100 mL IVPB       "Followed by" Linked Group Details   100 mg 200 mL/hr over 30 Minutes Intravenous Daily 01/13/2021 1845 01/05/21 0921   01/13/2021 2000  remdesivir 200 mg in sodium chloride 0.9% 250 mL IVPB       "Followed by" Linked Group Details   200 mg 580 mL/hr over 30 Minutes Intravenous Once 12/23/2020 1845 12/31/2020 2017      Medications: Scheduled Meds: . amiodarone  150 mg Intravenous Once  . chlorhexidine gluconate (MEDLINE KIT)  15 mL Mouth  Rinse BID  . Chlorhexidine Gluconate Cloth  6 each Topical Daily  . Chlorhexidine Gluconate Cloth  6 each Topical Q0600  . Chlorhexidine Gluconate Cloth  6 each Topical Q0600  . enoxaparin (LOVENOX) injection  30 mg Subcutaneous Q24H  . feeding supplement (PIVOT 1.5 CAL)  1,000 mL Per Tube Q24H  . insulin aspart  0-20 Units Subcutaneous Q4H  . insulin aspart  3 Units Subcutaneous Q4H  . mouth rinse  15 mL Mouth Rinse 10 times per day  . mupirocin ointment  1 application Nasal BID  . pantoprazole (PROTONIX) IV  40 mg Intravenous Q24H  . sodium chloride flush  10-40 mL Intracatheter Q12H   Continuous Infusions: . sodium chloride 100 mL (01/14/21 0030)  . sodium chloride    . amiodarone 30 mg/hr (01/14/21 0900)  . dexmedetomidine (PRECEDEX) IV infusion Stopped (01/13/21 1253)  . dextrose 60 mL/hr at 01/14/21 0900  . fentaNYL infusion INTRAVENOUS Stopped (01/13/21 2214)  . levETIRAcetam Stopped (01/13/21 2003)  . piperacillin-tazobactam (ZOSYN)  IV    . TPN ADULT (ION) 60 mL/hr at 01/14/21 0900   PRN Meds:.sodium chloride, sodium chloride, acetaminophen (TYLENOL) oral liquid 160 mg/5 mL, acetaminophen, alteplase, fentaNYL, fentaNYL (SUBLIMAZE) injection, heparin, hydrALAZINE, ipratropium-albuterol, labetalol, lidocaine (PF), lidocaine-prilocaine, midazolam, pentafluoroprop-tetrafluoroeth, sodium chloride flush  Assessment/Plan: Patient Active Problem List   Diagnosis Date Noted  . ARDS (adult respiratory distress syndrome) ([place])   . Ischemic bowel disease ([place])   . Bacteremia   . Severe sepsis without septic shock ([place])   . Acute respiratory disease due to COVID-19 virus 01/02/2021  . Elevated serum creatinine 01/02/2021  . Pneumonia due to COVID-19 virus 01/04/2021  . COVID-19 12/27/2020  . Gout 08/05/2020  . Need for hepatitis C screening test 08/05/2020  . Onychomycosis of multiple toenails with type 2 diabetes mellitus ([place]) 08/06/2019  . Exocrine pancreatic insufficiency  05/18/2019  . B12 deficiency 10/31/2018  . Thrombocytosis 10/29/2018  . GERD without esophagitis 01/22/2018  . Asymptomatic gallstones 09/09/2015  . Routine general medical examination at a health care facility 02/10/2015  . Prostate nodule with urinary obstruction 07/17/2011  . Type II diabetes mellitus with manifestations ([place]) 08/05/2009  . Diabetic neuropathy, painful ([place]) 07/06/2009  . Hyperlipidemia with target LDL less than 100 04/22/2009  . Essential hypertension, benign 04/22/2009  . Erectile dysfunction associated with type 2 diabetes mellitus ([place]) 04/21/2009   COVID pneumonia withVDRF with likely HCAP as well -intubated, per CCM -solumedrol/remdexivir/tocilizumab Worsening renal failure-2.75 Peripheral vascular compromise- hypoperfusion noted  Pancreaticinsufficiency- On Creon at home, currently on hold Type 2 diabetes Hypertension Hyperlipidemia Gout Hx B12  deficiency 20 pack yr hx of tobacco use, none since age 8 Clastridium Cadaveris bacteremia - per medicine Leukocytosis - down to 52.7 today  POD 6, s/p ex lap with SBR x2 for ischemic bowel, open abdomen, Dr. Redmond Pulling 2/20, POD 3, s/p reconnection and abdominal closure, Dr. Kieth Brightly 2/22 -patient returned to continuity. -TFs on hold due to vomiting overnight -no further ischemia noted yesterday in OR.  WBC elevation likely not secondary to intra-abdominal source given no evidence for ischemia or infection -change wound VAC on Friday to get on a MWF schedule. -will DC NGT and place a Cortrak  FEN: IVFs/TNA/NGT/TFs on hold VTE: Lovenox ID: [id number] [email or web address]  Henreitta Cea , [facility] Surgery 01/10/2021, 9:31 AM  Please see Amion for pager number during day hours 7:00am-4:30pm or 7:00am -11:30am on weekends

## 2021-01-14 NOTE — Plan of Care (Signed)
  Problem: Respiratory: Goal: Will maintain a patent airway Outcome: Progressing Goal: Complications related to the disease process, condition or treatment will be avoided or minimized Outcome: Progressing   Problem: Clinical Measurements: Goal: Ability to maintain clinical measurements within normal limits will improve Outcome: Progressing Goal: Will remain free from infection Outcome: Progressing Goal: Diagnostic test results will improve Outcome: Progressing Goal: Respiratory complications will improve Outcome: Progressing Goal: Cardiovascular complication will be avoided Outcome: Progressing   Problem: Nutrition: Goal: Adequate nutrition will be maintained Outcome: Progressing   Problem: Pain Managment: Goal: General experience of comfort will improve Outcome: Progressing   Problem: Safety: Goal: Ability to remain free from injury will improve Outcome: Progressing   Problem: Skin Integrity: Goal: Risk for impaired skin integrity will decrease Outcome: Progressing

## 2021-01-14 NOTE — Progress Notes (Signed)
[facility] Progress Note  Name: Thomas Chung MRN: [id number] DOB: [birth year]  Chief Complaint:  weakness  Subjective:  Patient had HD on 2/24 after nontunneled catheter with pulm.  No UF (charted as neg 400).  He had 2.8 liters UOP over 2/24.  Per nursing his pressures dropped with HD.  He has continued with amio gtt.  FIO2 90 and PEEP 5.   Review of systems:  Unable to obtain 2/2 intubated ------------- Background on consult:  Thomas Chung is a 80 y.o. male with a history of type 2 diabetes, hypertension, hyperlipidemia, GERD and depression who presented to the hospital with fatigue and diarrhea.  He tested positive for Covid as an outpatient and was initially managed as outpatient.  Then came with fatigue.  Course here has been complicated by respiratory failure with intubation on 2/20. Course also complicated by bowel ischemia s/p ex-lap and has also had Klebsiella pneumonia on top of his Covid pneumonia.  Pulmonology has had trouble getting him off the ventilator and waking him up.  Note that today his BUN is 178 with Cr 2.70.  Home meds include chlorthalidone, arb, and colchicine PRN.  He had 2.6 liters UOP over 2/23.  Charted as having been on LR and dextrose.  147/55.  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 He would want dialysis/RRT if needed.  Normally very functional.  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   Intake/Output Summary (Last 24 hours) at 01/14/2021 0528 Last data filed at 01/14/2021 0400 Gross per 24 hour  Intake 3031.45 ml  Output 2585 ml  Net 446.45 ml    Vitals:  Vitals:   01/14/21 0415 01/14/21 0430 01/14/21 0436 01/14/21 0445  BP: (!) 89/43 (!) 95/49  (!) 93/47  Pulse: 81 85 94 91  Resp: (!) 27 (!) 24 (!) 25 (!) 24  Temp: 98.6 F (37 C) 98.6 F (37 C)  98.6 F (37 C)  TempSrc:      SpO2: 98% 98% 95% 93%  Weight:      Height:         Physical Exam:  General: elderly male in bed  intubated BP 100/46 HR 87 HEENT: NCAT Neck: trachea midline  Heart: S1S2 no rub Lungs: coarse reduced breath sounds Abdomen: soft/ND/wound vac in place Extremities: 1-2+ edema upper and lower extremities Skin: no rash on extremities exposed Neuro: no sedation currently running. Does not respond to commands GU: foley in place  Medications reviewed   Labs:  BMP Latest Ref Rng & Units 01/14/2021 01/13/2021 01/12/2021  Glucose 70 - 99 mg/dL 174(H) 146(H) 148(H)  BUN 8 - 23 mg/dL 153(H) 178(H) 166(H)  Creatinine 0.61 - 1.24 mg/dL 2.75(H) 2.70(H) 2.62(H)  BUN/Creat Ratio 10 - 24 - - -  Sodium 135 - 145 mmol/L 152(H) 147(H) 149(H)  Potassium 3.5 - 5.1 mmol/L 4.5 5.0 6.2(H)  Chloride 98 - 111 mmol/L 114(H) 117(H) 117(H)  CO2 22 - 32 mmol/L 26 21(L) 20(L)  Calcium 8.9 - 10.3 mg/dL 7.8(L) 6.9(L) 7.6(L)     Assessment/Plan:   # Acute kidney injury - Secondary to ATN; steroids with azotemia. Excellent urine output.   Patient had first HD on 2/24 after nontunneled catheter with pulm. Baseline Cr 1 - 1.3  - Plan for HD today 2/25 then assess needs daily.  Treatment may ultimately be 2/26 due to staffing and just rec'd 1st tx late on 2/24.  Feel given still making good  urine and BP ok would be easier to monitor for recovery with HD than CRRT.  If hemodynamics do not support HD will transition to CRRT   # Acute hypoxic respiratory failure - ARDS 2/2 covid and complicated by Klebsiella HCAP  - optimize resp status with HD as well  - making excellent urine output  # Encephalopathy  - Secondary in part to uremia likely    # Ischemic bowel  - S/p ex lap - wound care per surgery/primary team   # Hypernatremia  - Free water deficit - Start D5W at 60 ml/hr  - If possible (s/p bowel surgery), please add enteric free water - for HD   # HTN - controlled; now hypotension; no current anti-hypertensives  # Anemia normocytic  - check iron stores; may need ESA soon   Claudia Desanctis,  MD 01/14/2021 5:48 AM

## 2021-01-14 NOTE — Progress Notes (Signed)
[facility] Progress Note Patient Name: Thomas Chung DOB: [birth year] MRN: [id number]   Date of Service  01/14/2021  HPI/Events of Note  Patient with desaturation, also in Afib but he is on an Amiodarone infusion already.  eICU Interventions  Lasix 40 mg iv x 1, BMP ordered, stat portable CXR ordered.        Kerry Kass Cadince Hilscher 01/14/2021, 3:17 AM

## 2021-01-14 NOTE — Progress Notes (Signed)
  Interdisciplinary Goals of Care Family Meeting   Date carried out:: 01/14/2021  Location of the meeting: Conference room  Member's involved: Physician, Nurse Practitioner, Bedside Registered Nurse and Other: Family Janit Bern Power of Attorney or acting medical decision maker: Spouse  Mikel Cella   Discussion: We discussed goals of care for [employer]".  Unfortunately [name] has had multiple medical complications during his prolonged hospital stay and is now progressing to multiple organ failure. Patient spouse Santiago Glad wishes to change patient code status to DNR but would like to continue iHD over the weekend with reevaluation of [name]'s condition Monday afternoon. All family members are in agreement with this plans.   Additionally all family members are in agreement with no further escalation in care.   Code status: Full DNR  Disposition: Continue current acute care  Time spent for the meeting: [place], NP-C [place] contact information can be found on Amion  If no response please page: Adult pulmonary and critical care medicine pager on Amion unitl 7pm After 7pm please call [phone] 01/14/2021, 5:05 PM

## 2021-01-14 NOTE — Progress Notes (Addendum)
NAME:  Thomas Chung, MRN:  [id number], DOB:  Nov 18, 1941, LOS: 33 ADMISSION DATE:  01/04/2021, CONSULTATION DATE: 01/06/2019 REFERRING MD: Triad, CHIEF COMPLAINT: Acute hypoxic respiratory failure  Brief History:  80 year old with history of Covid on 01/08/2021 who defervesced and required intubation on 01/06/2021.   Worsening lactic acidosis and leukocytosis 2/20 taken to OR found to have necrotic and ischemic small bowel s/p resection x 2 with wound vac.  Also questionable seizure activity, transferred to [facility] for LTM and further Neurology evaluation.   History of Present Illness:  80 year old former smoker quit when he was 55 he has a past medical history for Covid on 12/22/2020, along with type 2 diabetes with neuropathy, pancreatic insufficiency,  Hypertension, and  history of gastroesophageal reflux disease.  He was admitted on 01/12/2021 with worsening respiratory distress and suspected ileus.  On 01/04/2021 had increasing oxygen demand demands and by 01/06/2021 was on 100% 35 L flow of oxygen pulmonary critical care was asked to evaluate and he was intubated at that time.  He has completed antiviral therapy.  He remains on Solu-Medrol and Acterma.  Currently is on full mechanical ventilatory support ARDS protocol.    Past Medical History:   Past Medical History:  Diagnosis Date  . Depression   . Fatty liver   . Glucose intolerance (impaired glucose tolerance)   . HOH (hard of hearing)    bilateral hearing aides  . Hx of colonic polyps   . Hyperlipidemia   . Hypertension   . Neuromuscular disorder ([place])    neuropathy feet    Significant Hospital Events:  01/06/2021 intubated 2/18 improving oxygenation however persistent lactic acidosis and abdomen exam concerning for peritonitis. CT abdomen showed possible enteritis/ischemia. Gen surgery consulted. Also had episode of seizure like activity. Neuro consulted 2/19 Cr peaked. UOP improving. FIO2 40% 2/20 Worsening lactic acidosis  concerning for bowel ischemia. To OR with ischemic small bowel s/p resection x 2. Questionable seizure activity, transferred to [facility] for LTM/ Neurology  2/24 off sedation for 24hr, nephrology consulted for possible need for iHD   Consults:  General surgery Critical care  Procedures:  2/17 ETT >> 2/20 OR- ex lap/ resection x 2 w/ open abd  2/20 RUE PICC >>  Significant Diagnostic Tests:  2/18 ct abd>>segmental enteritis in right lower quadrant concerning for early ischemia. No bowel obstruction. Splenomegaly with findings for possible hypoperfusion/early infarct. Basilar lung with consolidation  2/21 MRI brain >> 1. Small foci of abnormal diffusion restriction in the right thalamus, possibly post-ictal or subacute ischemia. 2. Areas of magnetic susceptibility effect in the basal ganglia and thalami likely indicate petechial blood superimposed on chronic mineralization. Additionally, susceptibility artifacts can create false positives on diffusion-weighted imaging.  Micro Data:  01/17/2021 BCx >> neg 01/05/2021 MRSA >> neg 01/07/2020 trach asp >> Klebsiella oxytoca (amp resistant, otherwise pan sensitive) 01/07/2021 blood cultures x2>> clostridium cadaveris  01/07/2021 urine culture>>  Antimicrobials:  01/07/2021 vancomycin>> 01/07/2021 cefepime 01/07/2021 zosyn >>  remdesivir 2/12 >> 2/16 Decadron 2/12 Solumedrol 2/13 >> tocilizumab 2/16  Interim History / Subjective:  Patient unable to tolerate for IHD treatment given hypotension, he received 1/2 hours of hemodialysis overnight Episode of vomiting overnight with trickle tube feeds, tube feeds currently on hold Patient also developed atrial fibrillation with rapid ventricular response overnight Currently 7.3 L positive T-max 99.5 overnight  Objective   Blood pressure (!) 106/53, pulse 93, temperature 98.42 F (36.9 C), temperature source Esophageal, resp. rate (!) 27, height 6\' 4"  (1.93 m),  weight 115.4 kg, SpO2 94 %.    Vent  Mode: PCV FiO2 (%):  [60 %-100 %] 70 % Set Rate:  [14 bmp] 14 bmp PEEP:  [5 cmH20] 5 cmH20 Plateau Pressure:  [15 cmH20-19 cmH20] 19 cmH20   Intake/Output Summary (Last 24 hours) at 01/14/2021 4401 Last data filed at 01/14/2021 0900 Gross per 24 hour  Intake 3246.93 ml  Output 2575 ml  Net 671.93 ml   Filed Weights   01/10/21 0600 01/08/2021 0513 01/14/21 0400  Weight: 110.1 kg 115.4 kg 115.4 kg   Physical Exam: General: Acute ill-appearing elderly male lying in bed unresponsive on mechanical ventilation HEENT: ETT, MM pink/moist, PERRL,  Neuro: Unresponsive no continuous sedating medications currently CV: Irregular rate and rhythm, rate controlled, no murmur, rubs, or gallops,  PULM: Mechanical breath sounds bilaterally, tolerating ventilator, FiO2 70%, PEEP 5 GI: soft, bowel sounds hypoactive in all 4 quadrants, non-tender, slightly-distended, midline wound VAC Extremities: warm/dry, significant generalized 3+ edema  Skin: no rashes or lesions  Resolved Hospital Problem list     Assessment & Plan:   Acute hypoxemic respiratory failure/ARDS secondary to [id number] complicated by Klebsiella oxytoca HCAP P: Continue ventilator support with lung protective strategies  Wean PEEP and FiO2 for sats greater than 90%. Head of bed elevated 30 degrees. Plateau pressures less than 30 cm H20.  Follow intermittent chest x-ray and ABG.   SAT/SBT as tolerated, mentation preclude extubation  Ensure adequate pulmonary hygiene  Follow cultures  VAP bundle in place  PAD protocol Steroids stopped 2/23  Ischemic small bowel  -s/p resection x 2 with wound vac 2/20 and open abd and re-exploration, anastamosis 2/22 Sepsis secondary to above  Clostridium cadaveris bacteremia -Likely secondary to bowel ischemia  P: Remains on Zosyn per ID Wound care per CCMS Tube feeds on hold due to vomiting overnight Remains on TPN  Multifactorial toxic metabolic encephalopathy -Bun continues to remain  elevated at 178 Seizure-like activity  - extensive workup detailed in Dr. Johny Chess note P: Patient continues to remain unresponsive despite no continuous sedation Repeat trial of IHD Remains off sedating medications Supportive care  Acute kidney injury - ATN in setting of sepsis and ischemic bowel P: Nephrology consulted, appreciate assistance Trial of IHD overnight but cut short due to hypotension Trial of IHD again today Follow renal function / urine output Trend Bmet Avoid nephrotoxins Ensure adequate renal perfusion   DM2 with hyperglycemia P: Continue SSI Continue insulin through TPN CBG every 4  Ischemic appearing feet -Question microthrombi from COVID infection, clinically monitor P: Continues to have pulses peripherally Supportive care  Best practice (evaluated daily)  Diet: TPN Pain/Anxiety/Delirium protocol (if indicated): PAD protocol VAP protocol (if indicated): Yes DVT prophylaxis: PPI Glucose control: some combination of insulin drip and insulin in TPN bag, defer to pharmD Mobility: Bedrest Disposition: Intensive care unit  Goals of Care:  Last date of multidisciplinary goals of care discussion: 01/06/2021 via phone Family and staff present: 01/06/2021 spoke with wife and daughter made them aware of impending intubation.Updated in person 2/17 per MD Summary of discussion: Agree they want intubation if necessary. Follow up goals of care discussion due:2/24 Code Status: Full code  CRITICAL CARE Performed by: Johnsie Cancel  Total critical care time: 45 minutes  Critical care time was exclusive of separately billable procedures and treating other patients.  Critical care was necessary to treat or prevent imminent or life-threatening deterioration.  Critical care was time spent personally by me on the following activities: development  of treatment plan with patient and/or surrogate as well as nursing, discussions with consultants, evaluation of  patient's response to treatment, examination of patient, obtaining history from patient or surrogate, ordering and performing treatments and interventions, ordering and review of laboratory studies, ordering and review of radiographic studies, pulse oximetry and re-evaluation of patient's condition.  Johnsie Cancel, NP-C [facility] Personal contact information can be found on Amion  If no response please page: Adult pulmonary and critical care medicine pager on Amion unitl 7pm After 7pm please call [phone] 01/14/2021, 9:42 AM    Pulmonary critical care attending:  This is a 80 year old gentleman history of COVID-19 diagnosed on 01/06/2021 required intubation and mechanical vent support. Patient found to have ischemic bowel, Clostridium cadaveric's bacteremia status post open abdomen reexploration and anastomosis.  Continues on Zosyn.  He has multifactorial reasons for acute toxic metabolic encephalopathy.  Has been evaluated by neurology.  Course also complicated by AKI.  He is making some urine however but intolerant of HD.  Type 2 diabetes.  BP (!) 134/54 (BP Location: Left Arm)   Pulse 73   Temp 98.6 F (37 C) (Esophageal)   Resp (!) 27   Ht 6\' 4"  (1.93 m)   Wt 115.4 kg   SpO2 92%   BMI 30.97 kg/m   General: Elderly male intubated on mechanical life support HEENT: NCAT endotracheal tube in place Heart: Regular rate rhythm S1-S2 Lungs: Bilateral mechanically ventilated breath sounds Abdomen: Abdominal dressing in place.  Labs: Reviewed  Assessment: Acute hypoxemic respiratory failure requiring intubation mechanical ventilation COVID-19 bilateral pneumonia Sepsis secondary to clostridial cadaveris bacteremia AKI, presumed related ATN, intolerant of HD Type 2 diabetes with hyperglycemia Lower extremity ischemia Acute metabolic toxic encephalopathy secondary to above  Plan: Continue Zosyn We appreciate infectious disease input. Remains off all  sedating medications as his encephalopathy has yet to improve At this point has been intolerant of intermittent HD with hypotension. Family does not want to add CVVHD to connect him to more machines. Goals of care discussions today. They have decided okay for a repeat trial of intermittent HD.  If he is intolerant of this would not continue. We will continue current course with no escalation of care. Now a DNR.  No initiation of vasopressors if he becomes hypotensive.  This patient is critically ill with multiple organ system failure; which, requires frequent high complexity decision making, assessment, support, evaluation, and titration of therapies. This was completed through the application of advanced monitoring technologies and extensive interpretation of multiple databases. During this encounter critical care time was devoted to patient care services described in this note for 36 minutes.  Garner Nash, DO [facility] 01/14/2021 5:05 PM

## 2021-01-14 NOTE — Plan of Care (Signed)

## 2021-01-15 DIAGNOSIS — U071 COVID-19: Secondary | ICD-10-CM | POA: Diagnosis not present

## 2021-01-15 DIAGNOSIS — N179 Acute kidney failure, unspecified: Secondary | ICD-10-CM

## 2021-01-15 DIAGNOSIS — K56609 Unspecified intestinal obstruction, unspecified as to partial versus complete obstruction: Secondary | ICD-10-CM

## 2021-01-15 DIAGNOSIS — Z7189 Other specified counseling: Secondary | ICD-10-CM

## 2021-01-15 DIAGNOSIS — R0902 Hypoxemia: Secondary | ICD-10-CM | POA: Diagnosis not present

## 2021-01-15 DIAGNOSIS — R7881 Bacteremia: Secondary | ICD-10-CM | POA: Diagnosis not present

## 2021-01-15 DIAGNOSIS — J80 Acute respiratory distress syndrome: Secondary | ICD-10-CM | POA: Diagnosis not present

## 2021-01-15 DIAGNOSIS — Z515 Encounter for palliative care: Secondary | ICD-10-CM

## 2021-01-15 DIAGNOSIS — J1282 Pneumonia due to coronavirus disease 2019: Secondary | ICD-10-CM | POA: Diagnosis not present

## 2021-01-15 LAB — BASIC METABOLIC PANEL
Anion gap: 11 (ref 5–15)
BUN: 113 mg/dL — ABNORMAL HIGH (ref 8–23)
CO2: 24 mmol/L (ref 22–32)
Calcium: 7.6 mg/dL — ABNORMAL LOW (ref 8.9–10.3)
Chloride: 108 mmol/L (ref 98–111)
Creatinine, Ser: 2.67 mg/dL — ABNORMAL HIGH (ref 0.61–1.24)
GFR, Estimated: 24 mL/min — ABNORMAL LOW (ref >60)
Glucose, Bld: 148 mg/dL — ABNORMAL HIGH (ref 70–99)
Potassium: 4.3 mmol/L (ref 3.5–5.1)
Sodium: 143 mmol/L (ref 135–145)

## 2021-01-15 LAB — CBC
HCT: 29.3 % — ABNORMAL LOW (ref 39.0–52.0)
Hemoglobin: 9.2 g/dL — ABNORMAL LOW (ref 13.0–17.0)
MCH: 26.7 pg (ref 26.0–34.0)
MCHC: 31.4 g/dL (ref 30.0–36.0)
MCV: 85.2 fL (ref 80.0–100.0)
Platelets: 57 10*3/uL — ABNORMAL LOW (ref 150–400)
RBC: 3.44 MIL/uL — ABNORMAL LOW (ref 4.22–5.81)
RDW: 18.6 % — ABNORMAL HIGH (ref 11.5–15.5)
WBC: 55.2 10*3/uL (ref 4.0–10.5)
nRBC: 21.5 % — ABNORMAL HIGH (ref 0.0–0.2)

## 2021-01-15 LAB — GLUCOSE, CAPILLARY
Glucose-Capillary: 136 mg/dL — ABNORMAL HIGH (ref 70–99)
Glucose-Capillary: 148 mg/dL — ABNORMAL HIGH (ref 70–99)
Glucose-Capillary: 154 mg/dL — ABNORMAL HIGH (ref 70–99)
Glucose-Capillary: 163 mg/dL — ABNORMAL HIGH (ref 70–99)
Glucose-Capillary: 164 mg/dL — ABNORMAL HIGH (ref 70–99)

## 2021-01-15 LAB — PHOSPHORUS: Phosphorus: 5 mg/dL — ABNORMAL HIGH (ref 2.5–4.6)

## 2021-01-15 LAB — MAGNESIUM: Magnesium: 2.7 mg/dL — ABNORMAL HIGH (ref 1.7–2.4)

## 2021-01-15 MED ORDER — FUROSEMIDE 10 MG/ML IJ SOLN
80.0000 mg | Freq: Once | INTRAMUSCULAR | Status: AC
  Administered 2021-01-15: 80 mg via INTRAVENOUS
  Filled 2021-01-15: qty 8

## 2021-01-15 MED ORDER — WHITE PETROLATUM EX OINT
TOPICAL_OINTMENT | CUTANEOUS | Status: DC | PRN
  Administered 2021-01-15: 0.2 via TOPICAL

## 2021-01-15 MED ORDER — TRACE MINERALS CU-MN-SE-ZN 300-55-60-3000 MCG/ML IV SOLN
INTRAVENOUS | Status: DC
  Filled 2021-01-15: qty 840

## 2021-01-15 MED ORDER — FREE WATER
100.0000 mL | Status: DC
  Administered 2021-01-15 (×3): 100 mL

## 2021-01-15 MED ORDER — WHITE PETROLATUM EX OINT
TOPICAL_OINTMENT | CUTANEOUS | Status: AC
  Administered 2021-01-15: 0.2 via TOPICAL
  Filled 2021-01-15: qty 28.35

## 2021-01-15 MED ORDER — SODIUM CHLORIDE 0.9 % IV SOLN: 510.0000 mg | Freq: Once | INTRAVENOUS | Status: DC

## 2021-01-15 MED ORDER — CHLORHEXIDINE GLUCONATE CLOTH 2 % EX PADS: 6.0000 | MEDICATED_PAD | Freq: Every day | CUTANEOUS | Status: DC

## 2021-01-15 MED ORDER — NEPRO/CARBSTEADY PO LIQD
1000.0000 mL | ORAL | Status: DC
  Administered 2021-01-15: 1000 mL
  Filled 2021-01-15: qty 1000

## 2021-01-17 LAB — GLUCOSE, CAPILLARY
Glucose-Capillary: >600 mg/dL (ref 70–99)
Glucose-Capillary: >600 mg/dL (ref 70–99)

## 2021-01-18 LAB — GLUCOSE, CAPILLARY
Glucose-Capillary: >600 mg/dL (ref 70–99)
Glucose-Capillary: >600 mg/dL (ref 70–99)

## 2021-01-18 NOTE — Progress Notes (Signed)
NAME:  Thomas Chung, MRN:  [id number], DOB:  08-Apr-1941, LOS: 59 ADMISSION DATE:  12/31/2020, CONSULTATION DATE: 01/06/2019 REFERRING MD: [facility], CHIEF COMPLAINT: Acute hypoxic respiratory failure  Brief History:  80 year old with history of Covid on 12/31/2020 who defervesced and required intubation on 01/06/2021.   Worsening lactic acidosis and leukocytosis 2/20 taken to OR found to have necrotic and ischemic small bowel s/p resection x 2 with wound vac.  Also questionable seizure activity, transferred to [facility] for LTM and further Neurology evaluation.   History of Present Illness:  80 year old former smoker quit when he was 7 he has a past medical history for Covid on 12/22/2020, along with type 2 diabetes with neuropathy, pancreatic insufficiency,  Hypertension, and  history of gastroesophageal reflux disease.  He was admitted on 01/02/2021 with worsening respiratory distress and suspected ileus.  On 01/04/2021 had increasing oxygen demand demands and by 01/06/2021 was on 100% 35 L flow of oxygen pulmonary critical care was asked to evaluate and he was intubated at that time.  He has completed antiviral therapy.  He remains on Solu-Medrol and Acterma.  Currently is on full mechanical ventilatory support ARDS protocol.    Past Medical History:   Past Medical History:  Diagnosis Date  . Depression   . Fatty liver   . Glucose intolerance (impaired glucose tolerance)   . HOH (hard of hearing)    bilateral hearing aides  . Hx of colonic polyps   . Hyperlipidemia   . Hypertension   . Neuromuscular disorder ([place])    neuropathy feet    Significant Hospital Events:  01/06/2021 intubated 2/18 improving oxygenation however persistent lactic acidosis and abdomen exam concerning for peritonitis. CT abdomen showed possible enteritis/ischemia. Gen surgery consulted. Also had episode of seizure like activity. Neuro consulted 2/19 Cr peaked. UOP improving. FIO2 40% 2/20 Worsening lactic acidosis  concerning for bowel ischemia. To OR with ischemic small bowel s/p resection x 2. Questionable seizure activity, transferred to [facility] for LTM/ Neurology  2/24 off sedation for 24hr, nephrology consulted for possible need for iHD   Consults:  General surgery Critical care  Procedures:  2/17 ETT >> 2/20 OR- ex lap/ resection x 2 w/ open abd  2/20 RUE PICC >>  Significant Diagnostic Tests:  2/18 ct abd>>segmental enteritis in right lower quadrant concerning for early ischemia. No bowel obstruction. Splenomegaly with findings for possible hypoperfusion/early infarct. Basilar lung with consolidation  2/21 MRI brain >> 1. Small foci of abnormal diffusion restriction in the right thalamus, possibly post-ictal or subacute ischemia. 2. Areas of magnetic susceptibility effect in the basal ganglia and thalami likely indicate petechial blood superimposed on chronic mineralization. Additionally, susceptibility artifacts can create false positives on diffusion-weighted imaging.  Micro Data:  12/25/2020 BCx >> neg 01/05/2021 MRSA >> neg 01/07/2020 trach asp >> Klebsiella oxytoca (amp resistant, otherwise pan sensitive) 01/07/2021 blood cultures x2>> clostridium cadaveris  01/07/2021 urine culture>>  Antimicrobials:  01/07/2021 vancomycin>> 01/07/2021 cefepime 01/07/2021 zosyn >>  remdesivir 2/12 >> 2/16 Decadron 2/12 Solumedrol 2/13 >> tocilizumab 2/16  Interim History / Subjective:   Critically ill. Intubated on mechanical life support.   Objective   Blood pressure (!) 125/49, pulse 88, temperature 98.96 F (37.2 C), resp. rate (!) 23, height 6\' 4"  (1.93 m), weight 101.2 kg, SpO2 94 %.    Vent Mode: PCV FiO2 (%):  [70 %-100 %] 100 % Set Rate:  [14 bmp] 14 bmp PEEP:  [5 cmH20] 5 cmH20 Plateau Pressure:  [18 MPN36-14 cmH20] 21  cmH20   Intake/Output Summary (Last 24 hours) at 29-Jan-2021 1117 Last data filed at 01/29/2021 0700 Gross per 24 hour  Intake 1948.54 ml  Output 2475 ml  Net  -526.46 ml   Filed Weights   01/13/2021 0513 01/14/21 0400 2021-01-29 0500  Weight: 115.4 kg 115.4 kg 101.2 kg    Physical Exam: General: critically ill intubated on life support  HEENT: ETT in place Neuro: off sedation, not following commands  CV: RRR, s1 s2  PULM: BL vented breaths  GI: soft, nt nd  Extremities: no edema  Skin: no rash  Resolved Hospital Problem list     Assessment & Plan:   Acute hypoxemic respiratory failure/ARDS secondary to [id number] complicated by Klebsiella oxytoca HCAP P: Continue mechanical ventilation per ARDS protocol Target TVol 6-8cc/kgIBW Target Plateau Pressure < 30cm H20 Target driving pressure less than 15 cm of water Target PaO2 55-65: titrate PEEP/FiO2 per protocol As long as PaO2 to FiO2 ratio is less than 1:150 position in prone position for 16 hours a day Ventilator associated pneumonia prevention protocol  Ischemic small bowel  -s/p resection x 2 with wound vac 2/20 and open abd and re-exploration, anastamosis 2/22 Sepsis secondary to above  Clostridium cadaveris bacteremia -Likely secondary to bowel ischemia  P: Zosyn per ID  Remains on TPN   Multifactorial toxic metabolic encephalopathy -Bun continues to remain elevated at 178 Seizure-like activity  - extensive workup detailed in Dr. Johny Chess note P: Trial of HD   Acute kidney injury - ATN in setting of sepsis and ischemic bowel P: Continue iHD   DM2 with hyperglycemia P: SSI + CBGs   Ischemic appearing feet -Question microthrombi from COVID infection, clinically monitor P: monitor  Best practice (evaluated daily)  Diet: TPN Pain/Anxiety/Delirium protocol (if indicated): PAD protocol VAP protocol (if indicated): Yes DVT prophylaxis: PPI Glucose control: some combination of insulin drip and insulin in TPN bag, defer to pharmD Mobility: Bedrest Disposition: Intensive care unit  Goals of Care:  Last date of multidisciplinary goals of care discussion: 01/06/2021  via phone Family and staff present: 01/06/2021 spoke with wife and daughter made them aware of impending intubation.Updated in person 2/17 per MD Summary of discussion: Agree they want intubation if necessary. Follow up goals of care discussion due:2/24 Code Status: DNR   This patient is critically ill with multiple organ system failure; which, requires frequent high complexity decision making, assessment, support, evaluation, and titration of therapies. This was completed through the application of advanced monitoring technologies and extensive interpretation of multiple databases. During this encounter critical care time was devoted to patient care services described in this note for 34 minutes.  [facility] 01-29-2021 5:57 PM

## 2021-01-18 NOTE — Progress Notes (Signed)
Noted several long pauses in pt's rhythm and then asystole.  No interventions as pt DNR.  Death pronounced by two RN's at 1743.  Attending MD, family, and [employer] notified.

## 2021-01-18 NOTE — Consult Note (Signed)
Consultation Note Date: 02-12-21   Patient Name: Thomas Chung  DOB: February 06, 1941  MRN: [id number]  Age / Sex: 80 y.o., male  PCP: Thomas Lima, MD Referring Physician: Garner Nash, DO  Reason for Consultation: Establishing goals of care  HPI/Patient Profile: 80 y.o. male  with past medical history of type 2 DM, neuropathy, pancreatic insufficiency, hypertension, GERD admitted on 01/08/2021 with weakness and diarrhea. Hospital admission for worsening respiratory distress and suspected ileus. Required intubation 01/06/21. 2/18 CT abdomen concerning for enteritis and ischemia. Also episode of seizure like activity. Surgery and neuro consulted. On 2/20, patient with worsening lactic acidosis taken to OR with ischemic small bowel s/p resection x2 with wound vac. Hospitalization complicated by Klebsiella oxytoca HCAP, ARDS, COVID-19 pneumonia, ischemic bowel with clostridium cadaveris bacteremia, toxic metabolic encephalopathy, and AKI requiring initiation of intermittent hemodialysis. Patient remains intubated and requiring 100% FIO2 on 2/26. Family decision for DNR code status on 2/25 but to continue full scope treatment through the weekend. Palliative medicine consultation for support and goals of care.   Clinical Assessment and Goals of Care:  I have reviewed medical records and discussed with care team. No family at bedside or in waiting area.  Spoke with wife and daughter via telephone to discuss goals of care and provide support.   I introduced Palliative Medicine as specialized medical care for people living with serious illness. It focuses on providing relief from the symptoms and stress of a serious illness. The goal is to improve quality of life for both the patient and the family.  We discussed a brief life review of the patient. Married to Thomas Chung for 58 years. They have three daughters. Prior  to this admission, family shares that Thomas Chung was fiercly independent. 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 Wife shares he has never been in the hospital and was in 'excellent' health prior to admission. Very active individual. Close, supportive family.   Discussed events leading up to admission and course of hospitalization including diagnoses, interventions, plan of care. Wife and daughter met with critical care team yesterday. They understanding Thomas Chung's condition and guarded prognosis.   Thomas Chung shares that her and her husband have completed living will documentation and she knows what he would want. She confirms decision for DNR code status.   Wife and family wish to continue current plan of care and full scope treatment through the weekend including further dialysis attempts. "Wait and see over the weekend" with plans for follow-up [place] discussion Monday pending clinical course. Wife is also interested in repeat CT early next week as this may help guide further decisions about his care, with ongoing poor responsiveness.   Questions and concerns were addressed. Reassured of ongoing palliative support this admission. PMT contact information given.    SUMMARY OF RECOMMENDATIONS    DNR in the event of cardiac arrest. Otherwise, continue full scope treatment. Wife and family wish to give Thomas Chung more time through the weekend and f/u [place]  discussion Monday pending clinical course.   Continue HD attempts. Nephrology following.   PMT provider will continue to follow and support patient and family.   Code Status/Advance Care Planning:  DNR  Symptom Management:   Per attending  Palliative Prophylaxis:   Aspiration, Bowel Regimen, Delirium Protocol, Frequent Pain Assessment, Oral Care and Turn Reposition  Psycho-social/Spiritual:   Desire for further Chaplaincy support: yes  Additional Recommendations: Caregiving  Support/Resources and  Compassionate Wean Education  Prognosis:   Tenuous with multiorgan failure  Discharge Planning: To Be Determined      Primary Diagnoses: Present on Admission: . Pneumonia due to COVID-19 virus . Type II diabetes mellitus with manifestations ([place]) . Essential hypertension, benign . Hyperlipidemia with target LDL less than 100 . Exocrine pancreatic insufficiency   I have reviewed the medical record, interviewed the patient and family, and examined the patient. The following aspects are pertinent.  Past Medical History:  Diagnosis Date  . Depression   . Fatty liver   . Glucose intolerance (impaired glucose tolerance)   . HOH (hard of hearing)    bilateral hearing aides  . Hx of colonic polyps   . Hyperlipidemia   . Hypertension   . Neuromuscular disorder ([place])    neuropathy feet   Social History   Socioeconomic History  . Marital status: Married    Spouse name: Not on file  . Number of children: Not on file  . Years of education: Not on file  . Highest education level: Not on file  Occupational History  . Occupation: retired    Fish farm manager: RETIRED  Tobacco Use  . Smoking status: Former Smoker    Quit date: 09/21/1969    Years since quitting: 51.3  . Smokeless tobacco: Never Used  Vaping Use  . Vaping Use: Never used  Substance and Sexual Activity  . Alcohol use: Yes    Alcohol/week: 6.0 standard drinks    Types: 6 Cans of beer per week  . Drug use: No  . Sexual activity: Yes  Other Topics Concern  . Not on file  Social History Narrative   No regular exercise   Social Determinants of Health   Financial Resource Strain: Medium Risk  . Difficulty of Paying Living Expenses: Somewhat hard  Food Insecurity: Not on file  Transportation Needs: Not on file  Physical Activity: Not on file  Stress: Not on file  Social Connections: Not on file   Family History  Problem Relation Age of Onset  . Arrhythmia Mother   . Heart attack Mother   . Heart disease  Mother   . Heart failure Father   . Diabetes Father   . Coronary artery disease Other        1st degree relative<60  . Alcohol abuse Other   . Diabetes Other   . Stomach cancer Sister   . Cancer Neg Hx   . Stroke Neg Hx   . Hyperlipidemia Neg Hx   . Hypertension Neg Hx   . Kidney disease Neg Hx    Scheduled Meds: . amiodarone  150 mg Intravenous Once  . chlorhexidine gluconate (MEDLINE KIT)  15 mL Mouth Rinse BID  . Chlorhexidine Gluconate Cloth  6 each Topical Daily  . enoxaparin (LOVENOX) injection  30 mg Subcutaneous Q24H  . free water  100 mL Per Tube Q4H  . insulin aspart  0-20 Units Subcutaneous Q4H  . insulin aspart  3 Units Subcutaneous Q4H  . mouth rinse  15 mL Mouth Rinse 10  times per day  . mupirocin ointment  1 application Nasal BID  . pantoprazole (PROTONIX) IV  40 mg Intravenous Q24H  . sodium chloride flush  10-40 mL Intracatheter Q12H   Continuous Infusions: . sodium chloride 100 mL (01/14/21 0030)  . sodium chloride    . amiodarone 30 mg/hr (2021-01-18 0700)  . feeding supplement (NEPRO CARB STEADY) 1,000 mL (18-Jan-2021 0800)  . levETIRAcetam 750 mg (01-18-2021 0800)  . piperacillin-tazobactam (ZOSYN)  IV Stopped (2021-01-18 0259)  . TPN ADULT (ION) 60 mL/hr at 01/14/21 1714   PRN Meds:.sodium chloride, sodium chloride, acetaminophen (TYLENOL) oral liquid 160 mg/5 mL, acetaminophen, alteplase, heparin, hydrALAZINE, ipratropium-albuterol, labetalol, lidocaine (PF), lidocaine-prilocaine, morphine injection, pentafluoroprop-tetrafluoroeth, sodium chloride flush Medications Prior to Admission:  Prior to Admission medications   Medication Sig Start Date End Date Taking? Authorizing Provider  Accu-Chek Softclix Lancets lancets Use to check blood sugar daily. DX: E11.8 07/07/20  Yes Thomas Lima, MD  Alcohol Swabs (B-D SINGLE USE SWABS REGULAR) PADS Inject 1 Act into the skin See admin instructions. 02/27/18  Yes Thomas Lima, MD  atorvastatin (LIPITOR) 40 MG tablet  TAKE 1 TABLET EVERY DAY 11/29/20  Yes Thomas Lima, MD  B Complex-C-Folic Acid (SUPER B COMPLEX/FA/VIT C) TABS Take by mouth.   Yes [provider]  blood glucose meter kit and supplies KIT Dispense based on patient and insurance preference. Use to check to blood sugar daily. DX: E11.9 07/07/20  Yes Thomas Lima, MD  chlorthalidone (HYGROTON) 25 MG tablet Take 1 tablet (25 mg total) by mouth daily. 11/15/20  Yes Thomas Lima, MD  CREON [phone] units CPEP capsule TAKE 1 CAPSULE THREE TIMES DAILY WITH MEALS 09/18/20  Yes Thomas Lima, MD  cyanocobalamin 1000 MCG tablet Take 1 tablet (1,000 mcg total) by mouth daily. 02/05/20  Yes Thomas Lima, MD  glucose blood test strip Use to check to blood sugar daily. DX: E11.9 07/07/20  Yes Thomas Lima, MD  loperamide (IMODIUM) 2 MG capsule Take by mouth as needed for diarrhea or loose stools.   Yes [provider]  metoprolol succinate (TOPROL-XL) 25 MG 24 hr tablet Take 1 tablet (25 mg total) by mouth daily. 11/04/20  Yes Thomas Lima, MD  MITIGARE 0.6 MG CAPS Take 1 capsule by mouth as needed.  06/26/20  Yes [provider]  olmesartan (BENICAR) 40 MG tablet Take 1 tablet (40 mg total) by mouth daily. 09/22/20  Yes Thomas Lima, MD  tadalafil (CIALIS) 5 MG tablet Take 1 tablet (5 mg total) by mouth daily as needed for erectile dysfunction. 03/04/19  Yes Thomas Lima, MD  allopurinol (ZYLOPRIM) 100 MG tablet TAKE 1 TABLET (100 MG TOTAL) BY MOUTH DAILY. 01/04/21   Thomas Lima, MD   Allergies  Allergen Reactions  . Amlodipine Swelling    Swelling in legs; no shortness of breath or tongue swelling.  Marland Kitchen Prevnar [Pneumococcal 13-Val Conj Vacc]     Joint pain, swelling   Review of Systems  Unable to perform ROS: Acuity of condition   Physical Exam Vitals and nursing note reviewed.  Constitutional:      Interventions: He is intubated.  Cardiovascular:     Rate and Rhythm: Normal rate.  Pulmonary:      Effort: No tachypnea, accessory muscle usage or respiratory distress. He is intubated.     Comments: 100% FIO2 Skin:    General: Skin is warm and dry.  Neurological:  Comments: Poorly responsive on ventilator, off sedation    Vital Signs: BP (!) 125/49   Pulse 88   Temp 98.96 F (37.2 C)   Resp (!) 23   Ht $R'6\' 4"'Qo$  (1.93 m)   Wt 101.2 kg   SpO2 94%   BMI 27.16 kg/m  Pain Scale: CPOT   Pain Score: 0-No pain   SpO2: SpO2: 94 % O2 Device:SpO2: 94 % O2 Flow Rate: .O2 Flow Rate (L/min): 60 L/min  IO: Intake/output summary:   Intake/Output Summary (Last 24 hours) at 01/26/21 8250 Last data filed at 01/26/21 0700 Gross per 24 hour  Intake 2321.82 ml  Output 2475 ml  Net -153.18 ml    LBM: Last BM Date: 01/03/21 Baseline Weight: Weight: 116.3 kg Most recent weight: Weight: 101.2 kg     Palliative Assessment/Data: PPS 30%   Flowsheet Rows   Flowsheet Row Most Recent Value  Intake Tab   Referral Department Critical care  Unit at Time of Referral ICU  Palliative Care Primary Diagnosis Sepsis/Infectious Disease  Palliative Care Type New Palliative care  Reason for referral Clarify Goals of Care  Date first seen by Palliative Care 01-26-2021  Clinical Assessment   Palliative Performance Scale Score 30%  Psychosocial & Spiritual Assessment   Palliative Care Outcomes   Patient/Family meeting held? Yes  Who was at the meeting? spoke with wife & daughter via phone  Palliative Care Outcomes Clarified goals of care, Provided psychosocial or spiritual support, ACP counseling assistance       Time Total: 40 Greater than 50%  of this time was spent counseling and coordinating care related to the above assessment and plan.  Signed by:  Ihor Dow, DNP, FNP-C Palliative Medicine Team  Phone: [phone] Fax: [phone]   Please contact Palliative Medicine Team phone at [phone] for questions and concerns.  For individual provider: See  Shea Evans

## 2021-01-18 NOTE — Progress Notes (Signed)
[facility] Progress Note  Name: LEDFORD GOODSON MRN: [id number] DOB: [birth year]  Chief Complaint:  weakness  Subjective:  Patient had HD on 2/25 with 0.5 kg removed.  Family had goals of care meeting with pulm and willing to try HD but would not want CRRT per discussion with pulm attending.  He had 1.9 liters UOP over 2/25.  Per pharmacy discussion with surgery 2/25 they ok'd free water enterically.  Spoke with nursing this am - he had a good night.    Review of systems:  Unable to obtain 2/2 intubated ------------- Background on consult:  MARSHAWN NORMOYLE is a 80 y.o. male with a history of type 2 diabetes, hypertension, hyperlipidemia, GERD and depression who presented to the hospital with fatigue and diarrhea.  He tested positive for Covid as an outpatient and was initially managed as outpatient.  Then came with fatigue.  Course here has been complicated by respiratory failure with intubation on 2/20. Course also complicated by bowel ischemia s/p ex-lap and has also had Klebsiella pneumonia on top of his Covid pneumonia.  Pulmonology has had trouble getting him off the ventilator and waking him up.  Note that today his BUN is 178 with Cr 2.70.  Home meds include chlorthalidone, arb, and colchicine PRN.  He had 2.6 liters UOP over 2/23.  Charted as having been on LR and dextrose.  147/55.  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 He would want dialysis/RRT if needed.  Normally very functional.  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   Intake/Output Summary (Last 24 hours) at 01-24-21 0536 Last data filed at 2021/01/24 0500 Gross per 24 hour  Intake 2687.12 ml  Output 2575 ml  Net 112.12 ml    Vitals:  Vitals:   01/24/21 0300 01/24/2021 0317 01/24/2021 0400 01/24/2021 0500  BP: (!) 127/45 (!) 127/45 (!) 126/46 (!) 132/47  Pulse: 76 80 71 73  Resp: (!) 27 (!) 25 (!) 26 (!) 25  Temp: 100.22 F (37.9 C)  100.04 F (37.8 C) 99.68 F  (37.6 C)  TempSrc:   Esophageal   SpO2: 96% 96% 95% 95%  Weight:    101.2 kg  Height:         Physical Exam:   General: elderly male in bed intubated HEENT: NCAT Neck: trachea midline  Heart: S1S2 no rub Lungs: coarse reduced breath sounds Abdomen: soft/ND/wound vac in place Extremities: 1-2+ edema upper and lower extremities Skin: no rash on extremities exposed Neuro: no sedation currently running. Does not respond to commands GU: foley in place Access RIJ nontunneled catheter  Medications reviewed   Labs:  BMP Latest Ref Rng & Units 01/14/2021 01/13/2021 01/12/2021  Glucose 70 - 99 mg/dL 174(H) 146(H) 148(H)  BUN 8 - 23 mg/dL 153(H) 178(H) 166(H)  Creatinine 0.61 - 1.24 mg/dL 2.75(H) 2.70(H) 2.62(H)  BUN/Creat Ratio 10 - 24 - - -  Sodium 135 - 145 mmol/L 152(H) 147(H) 149(H)  Potassium 3.5 - 5.1 mmol/L 4.5 5.0 6.2(H)  Chloride 98 - 111 mmol/L 114(H) 117(H) 117(H)  CO2 22 - 32 mmol/L 26 21(L) 20(L)  Calcium 8.9 - 10.3 mg/dL 7.8(L) 6.9(L) 7.6(L)     Assessment/Plan:   # Acute kidney injury - Secondary to ATN; steroids with azotemia. Excellent urine output.   Patient had first HD on 2/24 after nontunneled catheter with pulm. Baseline Cr 1 - 1.3  - HD today and assess needs daily  - Lasix  80 mg IV once  - await labs from today  - Transition to nepro   # Acute hypoxic respiratory failure - ARDS 2/2 covid and complicated by Klebsiella HCAP  - optimize resp status with HD as well   # Encephalopathy  - Secondary in part to uremia likely; optimize metabolic derangements with HD  # Ischemic bowel  - S/p ex lap - wound care per surgery/primary team   # Hypernatremia  - Free water deficit - await labs - on D5W at 60 ml/hr - goal to stop as issues with hyperglycemia per pharmacy  - add enteric free water 100 ml every 4 hours - for HD   # HTN  - controlled  # Anemia normocytic - iron deficient - defer IV with covid; may need ESA soon; add oral iron when po  meds ok   # Afib  - on amio; per primary   # Hyperphosphatemia  - Follow with HD for now  - transition to nepro   Claudia Desanctis, MD January 24, 2021 5:59 AM

## 2021-01-18 NOTE — Progress Notes (Signed)
4 Days Post-Op   Subjective/Chief Complaint: Events noted. Family has decided on DNR and see how he does over weekend   Objective: Vital signs in last 24 hours: Temp:  [98.24 F (36.8 C)-101.12 F (38.4 C)] 98.96 F (37.2 C) (02/26 0700) Pulse Rate:  [70-88] 71 (02/26 0700) Resp:  [16-28] 25 (02/26 0700) BP: (106-146)/(39-67) 125/49 (02/26 0700) SpO2:  [87 %-96 %] 95 % (02/26 0700) FiO2 (%):  [70 %-100 %] 100 % (02/26 0400) Weight:  [101.2 kg] 101.2 kg (02/26 0500) Last BM Date: 01/03/21  Intake/Output from previous day: 02/25 0701 - 02/26 0700 In: 2620.2 [I.V.:2444.9; IV Piggyback:175.2] Out: 2475 [Urine:1875; Emesis/NG output:100] Intake/Output this shift: No intake/output data recorded.  GI: stable, vac in place  Lab Results:  Recent Labs    01/14/21 0336 02/02/2021 0550  WBC 52.7* 55.2*  HGB 9.9* 9.2*  HCT 33.8* 29.3*  PLT 81* 57*   BMET Recent Labs    01/14/21 0336 02-Feb-2021 0550  NA 152* 143  K 4.5 4.3  CL 114* 108  CO2 26 24  GLUCOSE 174* 148*  BUN 153* 113*  CREATININE 2.75* 2.67*  CALCIUM 7.8* 7.6*   PT/INR No results for input(s): LABPROT, INR in the last 72 hours. ABG No results for input(s): PHART, HCO3 in the last 72 hours.  Invalid input(s): PCO2, PO2  Studies/Results: DG Chest Port 1 View  Result Date: 01/14/2021 CLINICAL DATA:  Acute respiratory failure.  Coronavirus infection. EXAM: PORTABLE CHEST 1 VIEW COMPARISON:  01/13/2021 FINDINGS: Endotracheal tube tip 5 cm above the carina. Orogastric or nasogastric tube enters the abdomen. Right arm PICC tip at the SVC RA junction. Right internal jugular catheter in the SVC above the right atrium. Widespread patchy pulmonary infiltrates persist. No worsening or new finding. IMPRESSION: Endotracheal tube tip 5 cm above the carina. Other lines and tubes well positioned. Widespread patchy pulmonary infiltrates persist. No worsening or new finding. Electronically Signed   By: Nelson Chimes M.D.   On:  01/14/2021 03:40   DG CHEST PORT 1 VIEW  Result Date: 01/13/2021 CLINICAL DATA:  Central line placement. COVID-19 viral pneumonia. Endotracheally intubated. EXAM: PORTABLE CHEST 1 VIEW COMPARISON:  01/04/2021 FINDINGS: A new right jugular central venous catheter is seen with tip overlying the distal SVC. Endotracheal tube, nasogastric tube, and right arm PICC line remain in appropriate position. No evidence of pneumothorax. Worsening pulmonary airspace disease is seen in the peripheral and lower lung zones bilaterally. Heart size is stable. No evidence of pleural effusion. IMPRESSION: New right jugular central venous catheter in appropriate position. No evidence of pneumothorax. Worsening bilateral pulmonary airspace disease. Electronically Signed   By: Marlaine Hind M.D.   On: 01/13/2021 14:21    Anti-infectives: Anti-infectives (From admission, onward)   Start     Dose/Rate Route Frequency Ordered Stop   01/14/21 2200  piperacillin-tazobactam (ZOSYN) IVPB 3.375 g  Status:  Discontinued        3.375 g 12.5 mL/hr over 240 Minutes Intravenous Every 12 hours 01/14/21 0828 01/14/21 1046   01/14/21 2200  piperacillin-tazobactam (ZOSYN) IVPB 3.375 g        3.375 g 12.5 mL/hr over 240 Minutes Intravenous Every 12 hours 01/14/21 1121 01/22/21 0959   12/28/2020 0800  vancomycin (VANCOREADY) IVPB 1500 mg/300 mL  Status:  Discontinued        1,500 mg 150 mL/hr over 120 Minutes Intravenous Every 48 hours 12/28/2020 0702 01/10/21 1421   01/07/21 2200  piperacillin-tazobactam (ZOSYN) IVPB 3.375 g  Status:  Discontinued       Note to Pharmacy: Pharmacy may modify dose for renal dysfunction. Pharmacy to manage and monitor antibiotic administration.   3.375 g 100 mL/hr over 30 Minutes Intravenous Every 8 hours 01/07/21 2014 01/07/21 2017   01/07/21 2200  piperacillin-tazobactam (ZOSYN) IVPB 3.375 g  Status:  Discontinued        3.375 g 12.5 mL/hr over 240 Minutes Intravenous Every 8 hours 01/07/21 2018 01/14/21  0828   01/07/21 1030  vancomycin (VANCOREADY) IVPB 2000 mg/400 mL        2,000 mg 200 mL/hr over 120 Minutes Intravenous  Once 01/07/21 0929 01/07/21 1327   01/07/21 1000  ceFEPIme (MAXIPIME) 2 g in sodium chloride 0.9 % 100 mL IVPB  Status:  Discontinued        2 g 200 mL/hr over 30 Minutes Intravenous Every 12 hours 01/07/21 0928 01/07/21 2010   01/07/21 0930  vancomycin variable dose per unstable renal function (pharmacist dosing)  Status:  Discontinued         Does not apply See admin instructions 01/07/21 0930 12/26/2020 0814   01/02/21 1000  remdesivir 100 mg in sodium chloride 0.9 % 100 mL IVPB       "Followed by" Linked Group Details   100 mg 200 mL/hr over 30 Minutes Intravenous Daily 01/13/2021 1845 01/05/21 0921   12/31/2020 2000  remdesivir 200 mg in sodium chloride 0.9% 250 mL IVPB       "Followed by" Linked Group Details   200 mg 580 mL/hr over 30 Minutes Intravenous Once 01/08/2021 1845 12/26/2020 2017      Assessment/Plan: s/p Procedure(s) with comments: EXPLORATORY LAPAROTOMY SMALL BOWEL RE-ANASTOMOSIS X2 (N/A) - ROOM 1 STARTING AT 09:00AM FOR 120 MIN No new recs  Patient Active Problem List   Diagnosis Date Noted  . ARDS (adult respiratory distress syndrome) ([place])   . Ischemic bowel disease ([place])   . Bacteremia   . Severe sepsis without septic shock ([place])   . Acute respiratory disease due to COVID-19 virus 01/02/2021  . Elevated serum creatinine 01/02/2021  . Pneumonia due to COVID-19 virus 12/26/2020  . COVID-19 12/27/2020  . Gout 08/05/2020  . Need for hepatitis C screening test 08/05/2020  . Onychomycosis of multiple toenails with type 2 diabetes mellitus ([place]) 08/06/2019  . 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 B12 deficiency 10/31/2018  . Thrombocytosis 10/29/2018  . GERD without esophagitis 01/22/2018  . Asymptomatic gallstones 09/09/2015  . Routine general medical examination at a health care facility 02/10/2015  . Prostate nodule with urinary  obstruction 07/17/2011  . Type II diabetes mellitus with manifestations ([place]) 08/05/2009  . Diabetic neuropathy, painful ([place]) 07/06/2009  . Hyperlipidemia with target LDL less than 100 04/22/2009  . Essential hypertension, benign 04/22/2009  . Erectile dysfunction associated with type 2 diabetes mellitus ([place]) 04/21/2009   COVID pneumonia withVDRF with likely HCAP as well -intubated, per CCM -solumedrol/remdexivir/tocilizumab Worsening renal failure-2.75 Peripheral vascular compromise- hypoperfusion noted  Pancreaticinsufficiency- On Creon at home, currently on hold Type 2 diabetes Hypertension Hyperlipidemia Gout Hx B12 deficiency 20 pack yr hx of tobacco use, none since age 79 Clastridium Cadaveris bacteremia - per medicine Leukocytosis - up to 44 today  POD 6, s/p ex lap with SBR x2 for ischemic bowel, open abdomen, Dr. Redmond Pulling 2/20, POD 3, s/p reconnection and abdominal closure, Dr. Kieth Brightly 2/22 -patient returned to continuity. -TFs on hold due to vomiting overnight -no further ischemia noted yesterday in OR.  WBC elevation likely  not secondary to intra-abdominal source given no evidence for ischemia or infection -change wound VAC on Friday to get on a MWF schedule. -will DC NGT and place a Cortrak  FEN: IVFs/TNA/NGT/TFs on hold VTE: Lovenox ID: [id number]   LOS: 14 days    Autumn Messing III Jan 20, 2021

## 2021-01-18 NOTE — Progress Notes (Signed)
PHARMACY - TOTAL PARENTERAL NUTRITION CONSULT NOTE   Indication: Prolonged ileus  Patient Measurements: Height: 6\' 4"  (193 cm) Weight: 101.2 kg (223 lb 1.7 oz) IBW/kg (Calculated) : 86.8 TPN AdjBW (KG): 93.7 Body mass index is 27.16 kg/m. Usual Weight: 120 kg on 11/04/20  Assessment:  80 yo male with hx DM2, neuropathy, pancreatic insufficiency, hypertension, GERD who required intubation 2/17 for COVID pneumonia.  He developed lactic acidosis concerning for peritonitis, CT showed possible enteritis/ischemia.  On 2/20, developed worsening lactic acidosis concerning for bowel ischemia and is now s/p small bowel resection times two, placement abthera wound vac.  Pharmacy consulted for TPN.  Glucose / Insulin: Hx DM2. A1c 6.7 on 11/04/20. CBGs 130-180 since TPN with 60 units insulin started. On 3 units q4h and rSSI q4h. Utilized 15 units / 12 hrs.  - D5W @ 60 cc/hr per nephrology for hypernatrenia - planning to d/c this AM and transition to FW per tube - Insulin gtt 2/22 AM till 2/24 AM - Methylprednisolone 60mg  BID to 40mg  daily on 2/21 & discontinued on 2/23. LD 2/23 AM.  Electrolytes: Na 143 (on D5W). K down to 4.3 (iHD + furosemide x1). Cl wnl. Phos down 5 << 7.0. Mg down 2.7 << 3.3. Corr Ca 9.8.  Renal: AKI (BL ~1.1) BUN 113 - decrease. UOP 0.60ml/kg/h. Receiving IHD - last 2/25 PM (2.5h BFR 300).  LFTs / TGs: AlkPhos 68, AST/ALT 47/27, TG 567 (propofol stopped) > 220 (2/25) Prealbumin / albumin: Prealbumin 22.1; Albumin 1.3 Intake / Output: NG 100 mL / 24 hrs, Drain 0 ml / 24 hrs, starting TFs @ 20 cc/hr MIVF: D5W @ 60 ml/hr per nephology for hypernatremia - off and switched to FW 100 PT q4h GI Imaging: 2/18 CT abd: segmental enteritis in right lower quadrant concerning for early ischemia. No bowel obstruction. Surgeries / Procedures:  2/20: ex-lap with small bowel resection x 2 2/22: reopening of ex-lap, anastomosis of small intestine x2  Central access: PICC 2/20 TPN start date:  2/21  Nutritional Goals (RD recommendations 2/23): KCal: 8242-3536 , 165-180g Protein: , Fluid: >/= 2 L/day Goal TPN rate is 100 mL/hr provides 168 g of protein/day and 21 g/L of lipids on MWF - Average 2520 kcal /day using slightly reduced dextrose concentration (due to additional dextrose infusion) of 20% instead of 23% which would provide an average of 2765 kcal/day  Current Nutrition:  TPN TF - resumed at 20 cc/hr   Plan:  Adjust concentrated TPN to 75 cc/hr  Electrolytes in TPN (no changes today): Continue 0 mEq/L of Na, 0 mEq/L of K, 0 mmol/L of Phos, 0 mEq/L Mg, 5 mEq/L of Ca. Max acetate. D5W off and started free water 100 q4h today Tube feed re-initiated at 20 cc/hr Continue scheduled 3 units q4h, rSSI q4h and increase insulin in TPN to 70 units  Add standard MVI and trace elements to TPN - removed chromium TPN labs Monday/Thursday, repeat electrolytes tomorrow  Palliative involved - noted plans to meet with family again on Monday, 2/28  Thank you for allowing pharmacy to be a part of this patient's care.  Alycia Rossetti, PharmD, BCPS Clinical Pharmacist Clinical phone for 01/31/21: [phone] 01/31/21 10:49 AM   **Pharmacist phone directory can now be found on [email or web address] ([email or web address]).  Listed under [place].

## 2021-01-18 DEATH — deceased

## 2021-02-18 NOTE — Death Summary Note (Signed)
DEATH SUMMARY   Patient Details  Name: Thomas Chung MRN: [id number] DOB: 1941/11/08  Admission/Discharge Information   Admit Date:  01/23/21  Date of Death: Date of Death: 02-06-21  Time of Death: Time of Death: 03-01-42  Length of Stay: 23-Feb-2023  Referring Physician: Janith Lima, MD   Reason(s) for Hospitalization  80 year old with history of Covid on 01-23-21 who defervesced and required intubation on 01/06/2021.   Worsening lactic acidosis and leukocytosis 2/20 taken to OR found to have necrotic and ischemic small bowel s/p resection x 2 with wound vac.  Also questionable seizure activity, transferred to [facility] for LTM and further Neurology evaluation.   Diagnoses  Preliminary cause of death: COVID-19 Secondary Diagnoses (including complications and co-morbidities):  Principal Problem:   Acute respiratory disease due to COVID-19 virus Active Problems:   Type II diabetes mellitus with manifestations ([place])   Hyperlipidemia with target LDL less than 100   Essential hypertension, benign   Goals of care, counseling/discussion   Exocrine pancreatic insufficiency   Pneumonia due to COVID-19 virus   Elevated serum creatinine   ARDS (adult respiratory distress syndrome) (HCC)   Ischemic bowel disease (HCC)   Bacteremia   Severe sepsis without septic shock (HCC)   SBO (small bowel obstruction) ([place])   Acute renal failure ([place])   Palliative care by specialist   Brief Hospital Course (including significant findings, care, treatment, and services provided and events leading to death)  Thomas Chung is a 80 y.o. year old male when he was 34 he has a past medical history for Covid on 12/22/2020, along with type 2 diabetes with neuropathy, pancreatic insufficiency,  Hypertension, and  history of gastroesophageal reflux disease.  He was admitted on 01-23-2021 with worsening respiratory distress and suspected ileus.  On 01/04/2021 had increasing oxygen demand demands and by 01/06/2021 was on  100% 35 L flow of oxygen pulmonary critical care was asked to evaluate and he was intubated at that time.  He has completed antiviral therapy.  He remains on Solu-Medrol and Acterma.  Currently is on full mechanical ventilatory support ARDS protocol.    Acute hypoxemic respiratory failure/ARDS secondary to [id number] complicated by Klebsiella oxytoca HCAP P: Continue mechanical ventilation per ARDS protocol Target TVol 6-8cc/kgIBW Target Plateau Pressure < 30cm H20 Target driving pressure less than 15 cm of water Target PaO2 55-65: titrate PEEP/FiO2 per protocol  Ischemic small bowel  -s/p resection x 2 with wound vac 2/20 and open abd and re-exploration, anastamosis 2/22 Sepsis secondary to above  Clostridium cadaveris bacteremia -Likely secondary to bowel ischemia  P: Zosyn   Remains on TPN   Multifactorial toxic metabolic encephalopathy -Bun continues to remain elevated at 178 Seizure-like activity  - extensive workup detailed in Dr. Johny Chess note P: Trial of HD   Acute kidney injury - ATN in setting of sepsis and ischemic bowel P: Continue iHD   DM2 with hyperglycemia P: SSI + CBGs   Ischemic appearing feet -Question microthrombi from COVID infection, clinically monitor P: monitor  Patient was made a DNR. He was on max mechanical vent support and tolerating HD. Unfortunately the patient suddenly went asystolic and died.   Pertinent Labs and Studies  Significant Diagnostic Studies CT ABDOMEN PELVIS WO CONTRAST  Result Date: 01/07/2021 CLINICAL DATA:  80 year old male with abdominal pain. EXAM: CT ABDOMEN AND PELVIS WITHOUT CONTRAST TECHNIQUE: Multidetector CT imaging of the abdomen and pelvis was performed following the standard protocol without IV contrast. COMPARISON:  CT abdomen pelvis dated  08/16/2010. FINDINGS: Evaluation of this exam is limited in the absence of intravenous contrast. Lower chest: Diffuse bilateral patchy airspace opacities most consistent with  multifocal pneumonia, likely viral or atypical in etiology including COVID-19. Aspiration is not excluded clinical correlation is recommended. There is coronary vascular calcification. No intra-abdominal free air. Small free fluid in the pelvis with a punctate focus of air. Hepatobiliary: The liver is grossly unremarkable. No intrahepatic biliary ductal dilatation. Multiple gallstones. No pericholecystic fluid or evidence of acute cholecystitis by CT. Pancreas: Unremarkable. No pancreatic ductal dilatation or surrounding inflammatory changes. Spleen: Splenomegaly measuring 18 cm in length. The spleen demonstrates a heterogeneous attenuation with areas of lower density which may represent infarct or hypoperfusion. Adrenals/Urinary Tract: The adrenal glands unremarkable. There is a 2 mm nonobstructing right renal upper pole calculus versus vascular calcification. There is no hydronephrosis or obstructing stone on either side. Right renal cysts measure up to 6 cm in the lower pole. The visualized ureters appear unremarkable. The urinary bladder is decompressed around a Foley catheter. There is apparent diffuse thickening of the bladder wall which may be partly related to underdistention. Cystitis is not excluded. Correlation with urinalysis recommended. Stomach/Bowel: An enteric tube is noted with tip in the distal body of the stomach. There is a mildly inflamed loop of bowel in the right lower quadrant anteriorly. There is air along the nondependent wall (73/2) concerning for pneumatosis. There is no bowel obstruction. The appendix is unremarkable. Vascular/Lymphatic: Moderate aortoiliac atherosclerotic disease. The IVC is unremarkable. No portal venous gas. There is no adenopathy. Reproductive: The prostate and seminal vesicles are grossly unremarkable. No pelvic mass. Other: None Musculoskeletal: Degenerative changes of the spine. No acute osseous pathology. IMPRESSION: 1. Segmental enteritis in the right lower  quadrant with findings concerning for possible early ischemia. Clinical correlation is recommended. No bowel obstruction. 2. Cholelithiasis. 3. Splenomegaly with findings of possible areas of splenic hypoperfusion/early infarct. Evaluation is very limited on noncontrast CT. 4. Diffuse bilateral patchy airspace opacities most consistent with multifocal pneumonia, likely viral or atypical in etiology including COVID-19. 5. Aortic Atherosclerosis (ICD10-I70.0). These results were called by telephone at the time of interpretation on 01/07/2021 at 7:08 pm to the patient's nurse Artis, who verbally acknowledged these results. Electronically Signed   By: Anner Crete M.D.   On: 01/07/2021 19:13   DG Chest 2 View  Result Date: 12/29/2020 CLINICAL DATA:  COVID pneumonia, cough EXAM: CHEST - 2 VIEW COMPARISON:  02/07/2016 FINDINGS: The lungs are symmetrically expanded and are clear. No pneumothorax or pleural effusion. Right-sided pleural thickening is again identified secondary to multiple remote right rib fractures. Cardiac size within normal limits. Pulmonary vascularity is normal. Multiple healed remote left rib fractures are noted. Osseous structures are otherwise age-appropriate. IMPRESSION: No active cardiopulmonary disease. Electronically Signed   By: Fidela Salisbury MD   On: 12/29/2020 16:55   DG Abd 1 View  Result Date: 01/06/2021 CLINICAL DATA:  NG tube placement EXAM: ABDOMEN - 1 VIEW COMPARISON:  01/06/2021 FINDINGS: NG tube is in place with the tip in the stomach. IMPRESSION: NG tube in the stomach. Electronically Signed   By: Rolm Baptise M.D.   On: 01/06/2021 10:47   DG Abd 1 View  Result Date: 01/05/2021 CLINICAL DATA:  Abdominal distension EXAM: ABDOMEN - 1 VIEW COMPARISON:  None. FINDINGS: Dilated small bowel throughout the central abdomen. No abnormal calcifications seen. IMPRESSION: Dilated small bowel throughout the central abdomen, concerning for small bowel obstruction. Electronically  Signed   By:  Ulyses Jarred M.D.   On: 01/05/2021 01:17   CT HEAD WO CONTRAST  Result Date: 01/07/2021 CLINICAL DATA:  Seizure.  COVID-19. EXAM: CT HEAD WITHOUT CONTRAST TECHNIQUE: Contiguous axial images were obtained from the base of the skull through the vertex without intravenous contrast. COMPARISON:  None. FINDINGS: Brain: There is no mass, hemorrhage or extra-axial collection. The size and configuration of the ventricles and extra-axial CSF spaces are normal. There is hypoattenuation of the white matter, most commonly indicating chronic small vessel disease. Vascular: No abnormal hyperdensity of the major intracranial arteries or dural venous sinuses. No intracranial atherosclerosis. Skull: The visualized skull base, calvarium and extracranial soft tissues are normal. Sinuses/Orbits: No fluid levels or advanced mucosal thickening of the visualized paranasal sinuses. No mastoid or middle ear effusion. The orbits are normal. IMPRESSION: 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 Electronically Signed   By: Ulyses Jarred M.D.   On: 01/07/2021 19:04   MR BRAIN WO CONTRAST  Result Date: 01/10/2021 CLINICAL DATA:  Seizure EXAM: MRI HEAD WITHOUT CONTRAST TECHNIQUE: Multiplanar, multiecho pulse sequences of the brain and surrounding structures were obtained without intravenous contrast. COMPARISON:  None. FINDINGS: Brain: Small foci of abnormal diffusion restriction in the right thalamus. There are corresponding areas of magnetic susceptibility effect in this region and in the left thalamus. Small amount of hyperintense T2-weighted signal at the posterior limb of the right internal capsule. The midline structures are normal. Generalized volume loss. Vascular: Major flow voids are preserved. Skull and upper cervical spine: Normal calvarium and skull base. Visualized upper cervical spine and soft tissues are normal. Sinuses/Orbits:Fluid in both mastoids. Fluid layering in the  nasopharynx. No nasopharyngeal mass. A nasogastric tube is present. Normal orbits. IMPRESSION: 1. Small foci of abnormal diffusion restriction in the right thalamus, possibly post-ictal or subacute ischemia. 2. Areas of magnetic susceptibility effect in the basal ganglia and thalami likely indicate petechial blood superimposed on chronic mineralization. Additionally, susceptibility artifacts can create false positives on diffusion-weighted imaging. Electronically Signed   By: Ulyses Jarred M.D.   On: 01/10/2021 23:17   US Abdomen Complete  Result Date: 01/07/2021 CLINICAL DATA:  Acute kidney injury. EXAM: ABDOMEN ULTRASOUND COMPLETE COMPARISON:  None. FINDINGS: Gallbladder: Cholelithiasis is noted without significant gallbladder wall thickening or pericholecystic fluid. No sonographic Murphy's sign is noted. Common bile duct: Diameter: 6 mm which is within normal limits. Liver: No focal lesion identified. Within normal limits in parenchymal echogenicity. Portal vein is patent on color Doppler imaging with normal direction of blood flow towards the liver. IVC: No abnormality visualized. Pancreas: Not visualized due to overlying bowel gas. Spleen: Size and appearance within normal limits. Right Kidney: Length: 12.6 cm. 6 cm simple cyst is noted. Echogenicity within normal limits. No mass or hydronephrosis visualized. Left Kidney: Length: 11.5 cm. 1.6 cm simple cyst is noted. Echogenicity within normal limits. No mass or hydronephrosis visualized. Abdominal aorta: Not visualized. Other findings: None. IMPRESSION: Cholelithiasis without evidence of cholecystitis. Bilateral simple renal cysts are noted. Pancreas and abdominal aorta are not visualized. Electronically Signed   By: Marijo Conception M.D.   On: 01/07/2021 10:18   DG Chest Port 1 View  Result Date: 01/14/2021 CLINICAL DATA:  Acute respiratory failure.  Coronavirus infection. EXAM: PORTABLE CHEST 1 VIEW COMPARISON:  01/13/2021 FINDINGS: Endotracheal tube  tip 5 cm above the carina. Orogastric or nasogastric tube enters the abdomen. Right arm PICC tip at the SVC RA junction. Right internal jugular catheter in the SVC above the  right atrium. Widespread patchy pulmonary infiltrates persist. No worsening or new finding. IMPRESSION: Endotracheal tube tip 5 cm above the carina. Other lines and tubes well positioned. Widespread patchy pulmonary infiltrates persist. No worsening or new finding. Electronically Signed   By: Nelson Chimes M.D.   On: 01/14/2021 03:40   DG CHEST PORT 1 VIEW  Result Date: 01/13/2021 CLINICAL DATA:  Central line placement. COVID-19 viral pneumonia. Endotracheally intubated. EXAM: PORTABLE CHEST 1 VIEW COMPARISON:  12/29/2020 FINDINGS: A new right jugular central venous catheter is seen with tip overlying the distal SVC. Endotracheal tube, nasogastric tube, and right arm PICC line remain in appropriate position. No evidence of pneumothorax. Worsening pulmonary airspace disease is seen in the peripheral and lower lung zones bilaterally. Heart size is stable. No evidence of pleural effusion. IMPRESSION: New right jugular central venous catheter in appropriate position. No evidence of pneumothorax. Worsening bilateral pulmonary airspace disease. Electronically Signed   By: Marlaine Hind M.D.   On: 01/13/2021 14:21   DG Chest Port 1 View  Result Date: 01/04/2021 CLINICAL DATA:  Intubation.  COVID positive EXAM: PORTABLE CHEST 1 VIEW COMPARISON:  Chest x-ray 01/08/2021. FINDINGS: Right PICC line noted with tip over cavoatrial junction. Endotracheal tube and NG tube in stable position. Heart size stable. Low lung volumes. Diffuse bilateral interstitial infiltrates are again noted without interim change. No pleural effusions or pneumothorax seen. IMPRESSION: 1. Right PICC line noted with tip over cavoatrial junction. Endotracheal tube and NG tube in stable position. 2. Low lung volumes. Diffuse bilateral interstitial infiltrates again noted without  interim change. Electronically Signed   By: Marcello Moores  Register   On: 01/02/2021 07:18   DG Chest Port 1 View  Result Date: 01/08/2021 CLINICAL DATA:  Abnormal respiration. EXAM: PORTABLE CHEST 1 VIEW COMPARISON:  January 08, 2021 FINDINGS: The ETT is in good position. The NG tube terminates below today's film. The cardiomediastinal silhouette is normal. No pneumothorax. Patchy bilateral pulmonary infiltrates are similar in the interval. No other acute abnormalities. IMPRESSION: Patchy bilateral pulmonary infiltrates are similar in the interval. Recommend follow-up to resolution. Support apparatus as above. Electronically Signed   By: Dorise Bullion III M.D   On: 01/08/2021 10:38   DG Chest Port 1 View  Result Date: 01/08/2021 CLINICAL DATA:  Adult respiratory distress syndrome. EXAM: PORTABLE CHEST 1 VIEW COMPARISON:  01/07/2021 FINDINGS: ET tube tip is in satisfactory position above the carina. The NG tube tip is below the level of the GE junction. Stable cardiomediastinal contours. Bilateral lower lung zone predominant airspace opacities are not significantly changed from previous exam. IMPRESSION: 1. No change in aeration to the lungs compared with previous exam. 2. Stable support apparatus. Electronically Signed   By: Kerby Moors M.D.   On: 01/08/2021 06:28   DG Chest Port 1 View  Result Date: 01/07/2021 CLINICAL DATA:  Hypoxia.  COVID-19 positive EXAM: PORTABLE CHEST 1 VIEW COMPARISON:  January 06, 2021. FINDINGS: Endotracheal tube tip is 4.7 cm above the carina. Nasogastric tube tip and side port are below the diaphragm. No pneumothorax. There is airspace opacity throughout the mid and lower lung regions bilaterally, similar to 1 day prior. No new opacity evident. Heart is upper normal in size with pulmonary vascularity normal. No adenopathy. No bone lesions. IMPRESSION: Tube positions as described without pneumothorax. Multifocal airspace opacity persists, consistent with bilateral multifocal  pneumonia. A degree of superimposed pulmonary edema cannot be entirely excluded by radiography. Stable cardiac silhouette. Electronically Signed   By: Lowella Grip  III M.D.   On: 01/07/2021 08:36   DG CHEST PORT 1 VIEW  Result Date: 01/06/2021 CLINICAL DATA:  NG tube placement EXAM: PORTABLE CHEST 1 VIEW COMPARISON:  01/06/2021 FINDINGS: Endotracheal tube is 5 cm above the carina. NG tube enters the stomach. Severe diffuse bilateral airspace disease, similar to prior study. No visible effusions or pneumothorax. No acute bony abnormality. IMPRESSION: Support devices in expected position as above. Stable severe diffuse bilateral airspace disease. Electronically Signed   By: Rolm Baptise M.D.   On: 01/06/2021 10:47   DG CHEST PORT 1 VIEW  Result Date: 01/06/2021 CLINICAL DATA:  Hypoxia, COVID-19 positive EXAM: PORTABLE CHEST 1 VIEW COMPARISON:  Portable exam 0831 hours compared to 01/05/2021 FINDINGS: Stable heart size and mediastinal contours. Extensive BILATERAL airspace infiltrates consistent with multifocal pneumonia and COVID-19, slightly increased since previous exam. No pleural effusion or pneumothorax. Osseous structures unremarkable. IMPRESSION: Slightly increased BILATERAL pulmonary infiltrates consistent with multifocal pneumonia and COVID-19. Electronically Signed   By: Lavonia Dana M.D.   On: 01/06/2021 10:26   DG CHEST PORT 1 VIEW  Result Date: 01/05/2021 CLINICAL DATA:  Shortness of breath.  COVID-19 positive EXAM: PORTABLE CHEST 1 VIEW COMPARISON:  January 01, 2021 FINDINGS: There is again noted multifocal airspace opacity throughout the lungs bilaterally. There is slightly less consolidation in the right base compared to most recent study. Opacities elsewhere appear similar. Heart is mildly enlarged with pulmonary vascularity normal. No adenopathy. No bone lesions. IMPRESSION: Multifocal airspace opacity, likely due to multifocal atypical organism pneumonia. Slightly less opacity right  base. Changes elsewhere similar. Stable cardiac prominence. Electronically Signed   By: Lowella Grip III M.D.   On: 01/05/2021 08:50   DG Chest Port 1 View  Result Date: 01/08/2021 CLINICAL DATA:  Cough and dyspnea, COVID positive 14 days prior EXAM: PORTABLE CHEST 1 VIEW COMPARISON:  12/29/2020 chest radiograph. FINDINGS: Low lung volumes. Stable cardiomediastinal silhouette with top-normal heart size. No pneumothorax. No pleural effusion. Moderate patchy opacities throughout the peripheral mid to lower lungs bilaterally, worsened. IMPRESSION: Worsening moderate patchy opacities throughout the peripheral mid to lower lungs bilaterally, compatible with COVID-19 pneumonia. Electronically Signed   By: Ilona Sorrel M.D.   On: 12/22/2020 17:44   DG Abd Portable 1V  Result Date: 01/06/2021 CLINICAL DATA:  Small-bowel obstruction. EXAM: PORTABLE ABDOMEN - 1 VIEW COMPARISON:  01/05/2021. FINDINGS: Persistent distended loops of small bowel noted in the mid abdomen. Paucity of colonic gas. No free air. Degenerative changes scoliosis lumbar spine. Bibasilar atelectasis. IMPRESSION: Persistent distended loops of small bowel noted in the mid abdomen again concerning for small bowel obstruction. Paucity of colonic gas. No free air. Electronically Signed   By: Marcello Moores  Register   On: 01/06/2021 07:09   EEG adult  Result Date: 01/10/2021 Lora Havens, MD     01/10/2021 11:35 AM Patient Name: MYRTLE BARNHARD MRN: [id number] Epilepsy Attending: Lora Havens Referring Physician/Provider: Dr. Amie [place] Date: 01/10/2021 Duration: 24.05 mins Patient history: 80 year old male with episode of twitching/tremoring of the face and right shoulder.  EEG to evaluate for seizures. Level of alertness:  lethargic AEDs during EEG study: Keppra, Versed Technical aspects: This EEG study was done with scalp electrodes positioned according to the 10-20 International system of electrode placement. Electrical activity was  acquired at a sampling rate of 500Hz  and reviewed with a high frequency filter of 70Hz  and a low frequency filter of 1Hz . EEG data were recorded continuously and digitally stored. Description: EEG showed continuous  generalized 3 to 6 Hz theta-delta slowing. Hyperventilation and photic stimulation were not performed.   ABNORMALITY -Continuous slow, generalized IMPRESSION: This study is suggestive of moderate to severe diffuse encephalopathy, nonspecific etiology. No seizures or epileptiform discharges were seen throughout the recording. Priyanka O Yadav   VAS [place] ABI WITH/WO TBI  Result Date: 01/10/2021 LOWER EXTREMITY DOPPLER STUDY Indications: Cold, blue toes. High Risk Factors: Hypertension, hyperlipidemia, Diabetes. Other Factors: COVID 19 positive.  Comparison Study: No prior studies. Performing Technologist: Carlos Levering RVT  Examination Guidelines: A complete evaluation includes at minimum, Doppler waveform signals and systolic blood pressure reading at the level of bilateral brachial, anterior tibial, and posterior tibial arteries, when vessel segments are accessible. Bilateral testing is considered an integral part of a complete examination. Photoelectric Plethysmograph (PPG) waveforms and toe systolic pressure readings are included as required and additional duplex testing as needed. Limited examinations for reoccurring indications may be performed as noted.  ABI Findings: +---------+------------------+-----+---------+--------+ Right    Rt Pressure (mmHg)IndexWaveform Comment  +---------+------------------+-----+---------+--------+ Brachial 148                    triphasic         +---------+------------------+-----+---------+--------+ PTA      191               1.16 triphasic         +---------+------------------+-----+---------+--------+ DP       170               1.04 triphasic         +---------+------------------+-----+---------+--------+ Great Toe                        Absent            +---------+------------------+-----+---------+--------+ +---------+------------------+-----+---------+-------+ Left     Lt Pressure (mmHg)IndexWaveform Comment +---------+------------------+-----+---------+-------+ Brachial 164                    triphasic        +---------+------------------+-----+---------+-------+ PTA      195               1.19 triphasic        +---------+------------------+-----+---------+-------+ DP       182               1.11 triphasic        +---------+------------------+-----+---------+-------+ Great Toe                       Absent           +---------+------------------+-----+---------+-------+ +-------+-----------+-----------+------------+------------+ ABI/TBIToday's ABIToday's TBIPrevious ABIPrevious TBI +-------+-----------+-----------+------------+------------+ Right  1.16                                           +-------+-----------+-----------+------------+------------+ Left   1.19                                           +-------+-----------+-----------+------------+------------+  Summary: Right: Resting right ankle-brachial index is within normal range. No evidence of significant right lower extremity arterial disease. The right toe-brachial index is abnormal. Unable to obtain TBI due to absent waveforms. Left: Resting left ankle-brachial index is within normal range. No evidence of significant left lower extremity arterial  disease. The left toe-brachial index is abnormal. Unable to obtain TBI due to absent waveforms.  *See table(s) above for measurements and observations.  Electronically signed by Ruta Hinds MD on 01/10/2021 at 6:01:36 PM.    Final    ECHOCARDIOGRAM COMPLETE  Result Date: 01/06/2021    ECHOCARDIOGRAM REPORT   Patient Name:   KEVIN SPACE Date of Exam: 01/06/2021 Medical Rec #:  [id number]        Height:       76.0 in Accession #:    [id number]       Weight:       252.4 lb Date of  Birth:  Feb 02, 1941        BSA:          2.446 m Patient Age:    26 years         BP:           151/77 mmHg Patient Gender: M                HR:           117 bpm. Exam Location:  Inpatient Procedure: 2D Echo, Cardiac Doppler and Color Doppler Indications:    I50.40* Unspecified combined systolic (congestive) and diastolic                 (congestive) heart failure  History:        Patient has no prior history of Echocardiogram examinations.                 Abnormal ECG, Signs/Symptoms:Shortness of Breath; Risk                 Factors:Hypertension, Diabetes and Dyslipidemia. Covid positive.  Sonographer:    Roseanna Rainbow RDCS Referring Phys: [id number] [facility]  Sonographer Comments: Echo performed with patient supine and on artificial respirator and Technically difficult study due to poor echo windows. Image acquisition challenging due to patient body habitus. IMPRESSIONS  1. Left ventricular ejection fraction, by estimation, is 55 to 60%. The left ventricle has normal function. The left ventricle has no regional wall motion abnormalities. 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 Indeterminate diastolic filling due to E-A fusion.  2. Right ventricular systolic function is normal. The right ventricular size is mildly enlarged. Tricuspid regurgitation signal is inadequate for assessing PA pressure.  3. The mitral valve is grossly normal. Trivial mitral valve regurgitation. No evidence of mitral stenosis.  4. The aortic valve is tricuspid. There is mild calcification of the aortic valve. There is mild thickening of the aortic valve. Aortic valve regurgitation is trivial. Mild aortic valve sclerosis is present, with no evidence of aortic valve stenosis. Comparison(s): No prior Echocardiogram. Conclusion(s)/Recommendation(s): Normal LVEF, but prominent basal septal hypertrophy. FINDINGS  Left Ventricle: Basal septum protrudes into LV cavity, otherwise moderate concentric LVH. Left  ventricular ejection fraction, by estimation, is 55 to 60%. The left ventricle has normal function. The left ventricle has no regional wall motion abnormalities. The left ventricular internal cavity size was normal in size. 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 Indeterminate diastolic filling due to E-A fusion. Right Ventricle: The right ventricular size is mildly enlarged. Right vetricular wall thickness was not well visualized. Right ventricular systolic function is normal. Tricuspid regurgitation signal is inadequate for assessing PA pressure. Left Atrium: Left atrial size was normal in size. Right Atrium: Right atrial size was normal in size. Pericardium: There is no evidence  of pericardial effusion. Mitral Valve: The mitral valve is grossly normal. There is mild thickening of the mitral valve leaflet(s). There is mild calcification of the mitral valve leaflet(s). Trivial mitral valve regurgitation. No evidence of mitral valve stenosis. MV peak gradient, 9.6 mmHg. The mean mitral valve gradient is 3.0 mmHg. Tricuspid Valve: The tricuspid valve is normal in structure. Tricuspid valve regurgitation is trivial. No evidence of tricuspid stenosis. Aortic Valve: The aortic valve is tricuspid. There is mild calcification of the aortic valve. There is mild thickening of the aortic valve. Aortic valve regurgitation is trivial. Mild aortic valve sclerosis is present, with no evidence of aortic valve stenosis. Pulmonic Valve: The pulmonic valve was grossly normal. Pulmonic valve regurgitation is not visualized. No evidence of pulmonic stenosis. Aorta: The aortic root, ascending aorta and aortic arch are all structurally normal, with no evidence of dilitation or obstruction. Venous: IVC assessment for right atrial pressure unable to be performed due to mechanical ventilation. IAS/Shunts: The atrial septum is grossly normal.  LEFT VENTRICLE PLAX 2D LVIDd:         3.80 cm       Diastology LVIDs:         2.50 cm      LV e' medial:    7.09 cm/s LV PW:         1.60 cm      LV E/e' medial:  11.1 LV IVS:        2.90 cm      LV e' lateral:   7.46 cm/s LVOT diam:     2.30 cm      LV E/e' lateral: 10.5 LV SV:         81 LV SV Index:   33 LVOT Area:     4.15 cm  LV Volumes (MOD) LV vol d, MOD A2C: 114.0 ml LV vol d, MOD A4C: 91.4 ml LV vol s, MOD A2C: 48.5 ml LV vol s, MOD A4C: 35.1 ml LV SV MOD A2C:     65.5 ml LV SV MOD A4C:     91.4 ml LV SV MOD BP:      62.1 ml RIGHT VENTRICLE RV S prime:     30.80 cm/s TAPSE (M-mode): 2.8 cm LEFT ATRIUM             Index       RIGHT ATRIUM           Index LA diam:        4.30 cm 1.76 cm/m  RA Area:     10.50 cm LA Vol (A2C):   31.7 ml 12.96 ml/m RA Volume:   15.90 ml  6.50 ml/m LA Vol (A4C):   32.9 ml 13.45 ml/m LA Biplane Vol: 35.8 ml 14.64 ml/m  AORTIC VALVE             PULMONIC VALVE LVOT Vmax:   119.00 cm/s PR End Diast Vel: 1.32 msec LVOT Vmean:  86.500 cm/s LVOT VTI:    0.195 m  AORTA Ao Root diam: 3.90 cm MITRAL VALVE MV Area (PHT): 6.17 cm     SHUNTS MV Area VTI:   4.13 cm     Systemic VTI:  0.20 m MV Peak grad:  9.6 mmHg     Systemic Diam: 2.30 cm MV Mean grad:  3.0 mmHg MV Vmax:       1.55 m/s MV Vmean:      69.0 cm/s MV Decel Time: 123 msec MV E velocity: 78.50 cm/s  MV A velocity: 125.00 cm/s MV E/A ratio:  0.63 Buford Dresser MD Electronically signed by Buford Dresser MD Signature Date/Time: 01/06/2021/9:22:00 PM    Final    [place] EKG SITE RITE  Result Date: 12/29/2020 If Site Rite image not attached, placement could not be confirmed due to current cardiac rhythm.   Microbiology Recent Results (from the past 240 hour(s))  Surgical PCR screen     Status: None   Collection Time: 12/28/2020  3:54 AM   Specimen: Nasal Mucosa; Nasal Swab  Result Value Ref Range Status   MRSA, PCR NEGATIVE NEGATIVE Final   Staphylococcus aureus NEGATIVE NEGATIVE Final    Comment: (NOTE) The Xpert SA Assay (FDA approved for NASAL specimens  in patients 60 years of age and older), is one component of a comprehensive surveillance program. It is not intended to diagnose infection nor to guide or monitor treatment. Performed at [facility], [place] [address] [place], [place] [address]     Lab Basic Metabolic Panel: Recent Labs  Lab 01/12/21 0911 01/13/21 0451 01/14/21 0336 01/24/21 0550  NA 149* 147* 152* 143  K 6.2* 5.0 4.5 4.3  CL 117* 117* 114* 108  CO2 20* 21* 26 24  GLUCOSE 148* 146* 174* 148*  BUN 166* 178* 153* 113*  CREATININE 2.62* 2.70* 2.75* 2.67*  CALCIUM 7.6* 6.9* 7.8* 7.6*  MG 3.9* 3.6* 3.3* 2.7*  PHOS 5.5* 5.2* 7.0* 5.0*   Liver Function Tests: Recent Labs  Lab 01/13/21 0451  AST 47*  ALT 27  ALKPHOS 68  BILITOT 1.0  PROT <3.0*  ALBUMIN 1.3*   No results for input(s): LIPASE, AMYLASE in the last 168 hours. No results for input(s): AMMONIA in the last 168 hours. CBC: Recent Labs  Lab 01/12/21 0524 01/13/21 0451 01/14/21 0336 01-24-21 0550  WBC 51.9* 65.9* 52.7* 55.2*  HGB 10.5* 11.2* 9.9* 9.2*  HCT 35.9* 35.4* 33.8* 29.3*  MCV 87.1 82.1 87.8 85.2  PLT 144* 115* 81* 57*   Cardiac Enzymes: No results for input(s): CKTOTAL, CKMB, CKMBINDEX, TROPONINI in the last 168 hours. Sepsis Labs: Recent Labs  Lab 01/12/21 0524 01/13/21 0451 01/14/21 0336 Jan 24, 2021 0550  WBC 51.9* 65.9* 52.7* 55.2*    Procedures/Operations  2/17 ETT >> 2/20 OR- ex lap/ resection x 2 w/ open abd  2/20 RUE PICC >>   [place] Hair L Karmen Altamirano 01/18/2021, 10:25 AM

## 2021-02-28 ENCOUNTER — Telehealth: Payer: Self-pay

## 2021-03-02 NOTE — Progress Notes (Signed)
error 

## 2021-05-05 ENCOUNTER — Ambulatory Visit: Payer: Medicare HMO | Admitting: Internal Medicine

## 2021-11-26 IMAGING — DX DG CHEST 1V PORT
1 series · 1 of 1 positions shown · non-contrast
Comparison: 01/11/2021

CLINICAL DATA: Central line placement. [id number] viral pneumonia.
Endotracheally intubated.

EXAM:
PORTABLE CHEST 1 VIEW

[chest]
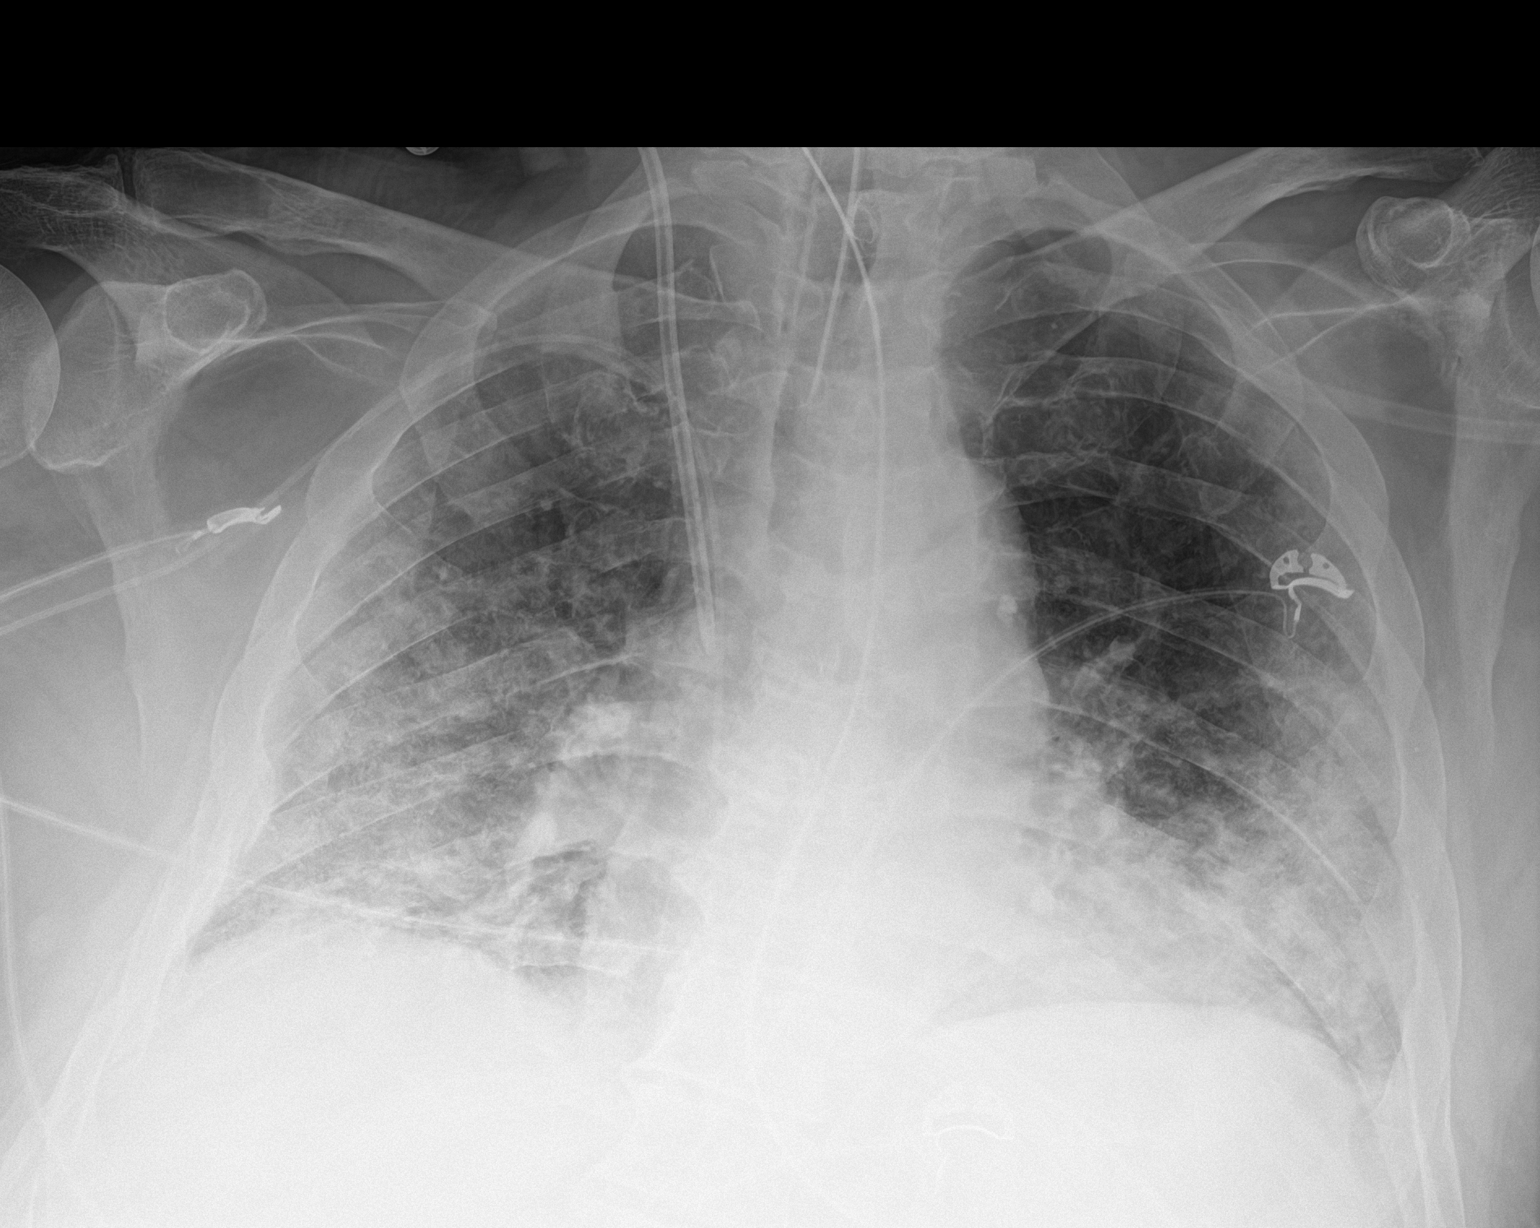

[1 of 1 positions shown; findings below may reference images not displayed]

FINDINGS: A new right jugular central venous catheter is seen with tip
overlying the distal SVC. Endotracheal tube, nasogastric tube, and
right arm PICC line remain in appropriate position.

No evidence of pneumothorax. Worsening pulmonary airspace disease is
seen in the peripheral and lower lung zones bilaterally. Heart size
is stable. No evidence of pleural effusion.
IMPRESSION: New right jugular central venous catheter in appropriate position.
No evidence of pneumothorax.

Worsening bilateral pulmonary airspace disease.
